# Patient Record
Sex: Female | Born: 1945 | Race: White | Hispanic: No | Marital: Married | Smoking: Former smoker
Health system: Southern US, Community
[De-identification: ages and names within clinical notes are randomized; demographics above are authoritative.]

## PROBLEM LIST (undated history)

## (undated) DIAGNOSIS — I639 Cerebral infarction, unspecified: Secondary | ICD-10-CM

## (undated) DIAGNOSIS — M810 Age-related osteoporosis without current pathological fracture: Secondary | ICD-10-CM

## (undated) DIAGNOSIS — I10 Essential (primary) hypertension: Secondary | ICD-10-CM

## (undated) DIAGNOSIS — F419 Anxiety disorder, unspecified: Secondary | ICD-10-CM

## (undated) DIAGNOSIS — R569 Unspecified convulsions: Secondary | ICD-10-CM

## (undated) DIAGNOSIS — F1010 Alcohol abuse, uncomplicated: Secondary | ICD-10-CM

## (undated) HISTORY — DX: Alcohol abuse, uncomplicated: F10.10

## (undated) HISTORY — PX: ABDOMINAL HYSTERECTOMY: SHX81

## (undated) HISTORY — DX: Anxiety disorder, unspecified: F41.9

## (undated) HISTORY — PX: EMPYEMA DRAINAGE: SHX5097

## (undated) HISTORY — DX: Unspecified convulsions: R56.9

## (undated) HISTORY — PX: BREAST LUMPECTOMY: SHX2

## (undated) HISTORY — DX: Age-related osteoporosis without current pathological fracture: M81.0

## (undated) HISTORY — PX: OOPHORECTOMY: SHX86

## (undated) HISTORY — DX: Cerebral infarction, unspecified: I63.9

---

## 1999-03-14 HISTORY — PX: FLEXIBLE SIGMOIDOSCOPY: SHX1649

## 2001-04-16 ENCOUNTER — Ambulatory Visit (HOSPITAL_COMMUNITY): Admission: RE | Admit: 2001-04-16 | Discharge: 2001-04-16 | Payer: Self-pay | Admitting: Internal Medicine

## 2001-04-16 ENCOUNTER — Encounter: Payer: Self-pay | Admitting: Internal Medicine

## 2001-10-16 ENCOUNTER — Encounter: Payer: Self-pay | Admitting: Internal Medicine

## 2001-10-16 ENCOUNTER — Emergency Department (HOSPITAL_COMMUNITY): Admission: EM | Admit: 2001-10-16 | Discharge: 2001-10-16 | Payer: Self-pay | Admitting: Internal Medicine

## 2002-04-29 ENCOUNTER — Encounter: Payer: Self-pay | Admitting: Internal Medicine

## 2002-04-29 ENCOUNTER — Ambulatory Visit (HOSPITAL_COMMUNITY): Admission: RE | Admit: 2002-04-29 | Discharge: 2002-04-29 | Payer: Self-pay | Admitting: Internal Medicine

## 2003-05-11 ENCOUNTER — Ambulatory Visit (HOSPITAL_COMMUNITY): Admission: RE | Admit: 2003-05-11 | Discharge: 2003-05-11 | Payer: Self-pay | Admitting: Internal Medicine

## 2004-03-21 ENCOUNTER — Ambulatory Visit (HOSPITAL_COMMUNITY): Admission: RE | Admit: 2004-03-21 | Discharge: 2004-03-21 | Payer: Self-pay | Admitting: Internal Medicine

## 2004-06-14 ENCOUNTER — Ambulatory Visit (HOSPITAL_COMMUNITY): Admission: RE | Admit: 2004-06-14 | Discharge: 2004-06-14 | Payer: Self-pay | Admitting: Internal Medicine

## 2004-12-26 ENCOUNTER — Ambulatory Visit (HOSPITAL_COMMUNITY): Admission: RE | Admit: 2004-12-26 | Discharge: 2004-12-26 | Payer: Self-pay | Admitting: Internal Medicine

## 2005-12-28 ENCOUNTER — Ambulatory Visit (HOSPITAL_COMMUNITY): Admission: RE | Admit: 2005-12-28 | Discharge: 2005-12-28 | Payer: Self-pay | Admitting: Internal Medicine

## 2009-08-03 DEATH — deceased

## 2010-06-14 ENCOUNTER — Inpatient Hospital Stay (HOSPITAL_COMMUNITY)
Admission: EM | Admit: 2010-06-14 | Discharge: 2010-06-16 | Payer: Self-pay | Source: Home / Self Care | Attending: Internal Medicine | Admitting: Internal Medicine

## 2010-06-16 ENCOUNTER — Inpatient Hospital Stay (HOSPITAL_COMMUNITY)
Admission: EM | Admit: 2010-06-16 | Discharge: 2010-06-22 | Payer: Self-pay | Source: Home / Self Care | Attending: Internal Medicine | Admitting: Internal Medicine

## 2010-06-20 DIAGNOSIS — F329 Major depressive disorder, single episode, unspecified: Secondary | ICD-10-CM

## 2010-07-23 ENCOUNTER — Encounter: Payer: Self-pay | Admitting: Internal Medicine

## 2010-07-24 ENCOUNTER — Encounter: Payer: Self-pay | Admitting: Internal Medicine

## 2010-09-12 LAB — COMPREHENSIVE METABOLIC PANEL
ALT: 41 U/L — ABNORMAL HIGH (ref 0–35)
Albumin: 3.6 g/dL (ref 3.5–5.2)
CO2: 26 mEq/L (ref 19–32)
Chloride: 103 mEq/L (ref 96–112)
Creatinine, Ser: 0.59 mg/dL (ref 0.4–1.2)
GFR calc Af Amer: >60 mL/min (ref >60)
Glucose, Bld: 101 mg/dL — ABNORMAL HIGH (ref 70–99)
Potassium: 3.6 mEq/L (ref 3.5–5.1)
Total Bilirubin: 0.7 mg/dL (ref 0.3–1.2)
Total Protein: 6.1 g/dL (ref 6.0–8.3)

## 2010-09-12 LAB — DIFFERENTIAL
Basophils Absolute: 0 10*3/uL (ref 0.0–0.1)
Eosinophils Absolute: 0.1 10*3/uL (ref 0.0–0.7)
Eosinophils Relative: 2 % (ref 0–5)
Lymphocytes Relative: 34 % (ref 12–46)
Lymphs Abs: 2.1 10*3/uL (ref 0.7–4.0)
Monocytes Absolute: 0.6 10*3/uL (ref 0.1–1.0)
Monocytes Relative: 10 % (ref 3–12)
Neutro Abs: 10.6 10*3/uL — ABNORMAL HIGH (ref 1.7–7.7)
Neutro Abs: 3.3 10*3/uL (ref 1.7–7.7)
Neutrophils Relative %: 85 % — ABNORMAL HIGH (ref 43–77)

## 2010-09-12 LAB — CBC
HCT: 35 % — ABNORMAL LOW (ref 36.0–46.0)
MCH: 32.6 pg (ref 26.0–34.0)
MCV: 93.1 fL (ref 78.0–100.0)
Platelets: 184 10*3/uL (ref 150–400)
WBC: 12.5 10*3/uL — ABNORMAL HIGH (ref 4.0–10.5)

## 2010-09-12 LAB — HEPATIC FUNCTION PANEL
Albumin: 3 g/dL — ABNORMAL LOW (ref 3.5–5.2)
Bilirubin, Direct: <0.1 mg/dL (ref 0.0–0.3)
Total Bilirubin: 0.3 mg/dL (ref 0.3–1.2)
Total Protein: 5.5 g/dL — ABNORMAL LOW (ref 6.0–8.3)

## 2010-09-13 LAB — COMPREHENSIVE METABOLIC PANEL
CO2: 21 mEq/L (ref 19–32)
Chloride: 102 mEq/L (ref 96–112)
GFR calc Af Amer: >60 mL/min (ref >60)
Potassium: 3.6 mEq/L (ref 3.5–5.1)
Sodium: 136 mEq/L (ref 135–145)

## 2010-09-13 LAB — POCT CARDIAC MARKERS
Myoglobin, poc: >500 ng/mL (ref 12–200)
Troponin i, poc: <0.05 ng/mL (ref 0.00–0.09)

## 2010-09-13 LAB — DIFFERENTIAL
Basophils Absolute: 0 10*3/uL (ref 0.0–0.1)
Basophils Relative: 0 % (ref 0–1)
Eosinophils Absolute: 0 10*3/uL (ref 0.0–0.7)
Lymphocytes Relative: 5 % — ABNORMAL LOW (ref 12–46)
Monocytes Absolute: 0.4 10*3/uL (ref 0.1–1.0)
Neutro Abs: 9.1 10*3/uL — ABNORMAL HIGH (ref 1.7–7.7)

## 2010-09-13 LAB — CBC
HCT: 43 % (ref 36.0–46.0)
Hemoglobin: 15.2 g/dL — ABNORMAL HIGH (ref 12.0–15.0)
MCH: 33 pg (ref 26.0–34.0)
MCHC: 35.3 g/dL (ref 30.0–36.0)
MCV: 93.5 fL (ref 78.0–100.0)
RBC: 4.6 MIL/uL (ref 3.87–5.11)

## 2010-09-13 LAB — RAPID URINE DRUG SCREEN, HOSP PERFORMED
Amphetamines: NOT DETECTED
Benzodiazepines: POSITIVE — AB
Tetrahydrocannabinol: NOT DETECTED

## 2010-09-13 LAB — URINE CULTURE
Colony Count: 15000
Culture  Setup Time: 201112132132

## 2010-09-13 LAB — URINALYSIS, ROUTINE W REFLEX MICROSCOPIC: Specific Gravity, Urine: 1.025 (ref 1.005–1.030)

## 2010-09-13 LAB — ETHANOL: Alcohol, Ethyl (B): <5 mg/dL (ref 0–10)

## 2010-11-10 ENCOUNTER — Other Ambulatory Visit (HOSPITAL_COMMUNITY): Payer: Self-pay | Admitting: Internal Medicine

## 2010-11-14 ENCOUNTER — Ambulatory Visit (HOSPITAL_COMMUNITY)
Admission: RE | Admit: 2010-11-14 | Discharge: 2010-11-14 | Disposition: A | Discharge status: Home / Self Care | Payer: Medicare Other | Source: Ambulatory Visit | Attending: Internal Medicine | Admitting: Internal Medicine

## 2010-11-14 ENCOUNTER — Encounter (HOSPITAL_COMMUNITY): Payer: Self-pay

## 2010-11-14 DIAGNOSIS — M818 Other osteoporosis without current pathological fracture: Secondary | ICD-10-CM | POA: Insufficient documentation

## 2010-11-14 HISTORY — DX: Essential (primary) hypertension: I10

## 2012-03-28 ENCOUNTER — Other Ambulatory Visit (HOSPITAL_COMMUNITY): Payer: Self-pay | Admitting: Internal Medicine

## 2012-03-28 DIAGNOSIS — Z139 Encounter for screening, unspecified: Secondary | ICD-10-CM

## 2012-04-08 ENCOUNTER — Telehealth: Payer: Self-pay | Admitting: *Deleted

## 2012-04-08 ENCOUNTER — Ambulatory Visit (HOSPITAL_COMMUNITY)
Admission: RE | Admit: 2012-04-08 | Discharge: 2012-04-08 | Disposition: A | Discharge status: Home / Self Care | Payer: Medicare Other | Source: Ambulatory Visit | Attending: Internal Medicine | Admitting: Internal Medicine

## 2012-04-08 DIAGNOSIS — Z1231 Encounter for screening mammogram for malignant neoplasm of breast: Secondary | ICD-10-CM | POA: Insufficient documentation

## 2012-04-08 DIAGNOSIS — Z139 Encounter for screening, unspecified: Secondary | ICD-10-CM

## 2012-04-08 NOTE — Telephone Encounter (Signed)
[name] called to be triaged for a colonoscopy. She will be unavailable the rest of the day, however you can call her tomorrow and she prefers to be called Shelley [name]. Thanks.

## 2012-04-09 ENCOUNTER — Telehealth: Payer: Self-pay

## 2012-04-09 NOTE — Telephone Encounter (Signed)
See separate triage.  

## 2012-04-09 NOTE — Telephone Encounter (Signed)
Started triage. Pt to call back with med list and insurance info.

## 2012-04-10 ENCOUNTER — Telehealth: Payer: Self-pay | Admitting: *Deleted

## 2012-04-10 NOTE — Telephone Encounter (Signed)
Shelley [name] called today to be triaged for a colonoscopy. Please call her back. Thanks.

## 2012-04-10 NOTE — Telephone Encounter (Signed)
See triage phone call. Pt will bring list of meds by to complete her triage.

## 2012-04-10 NOTE — Telephone Encounter (Signed)
Pt will try to get med list by the office today. Her husband keeps them locked up and she will get him to make a copy and bring it by.

## 2012-05-01 ENCOUNTER — Telehealth: Payer: Self-pay

## 2012-05-02 NOTE — Telephone Encounter (Signed)
Pt had sent a list of her meds back. I called back yesterday to get her scheduled. Each time I call and tell who I am and what I am calling for, she still asks me are we the [facility]. She doesn't seem to understand the conversation. Per Lorenza Burton, NP, schedule pt an OV prior to colonoscopy. Ask if she has a POA or please bring a family member with her when she comes and bring all meds to appt. I called, line was busy and could not leave a message.

## 2012-05-08 NOTE — Telephone Encounter (Signed)
Called and scheduled OV with KJ on 05/13/2012 at 2:00 PM. Pt said her husband would come with her to the appt.

## 2012-05-13 ENCOUNTER — Ambulatory Visit: Payer: Medicare Other | Admitting: Urgent Care

## 2012-05-14 ENCOUNTER — Ambulatory Visit (INDEPENDENT_AMBULATORY_CARE_PROVIDER_SITE_OTHER): Payer: Medicare Other | Admitting: Gastroenterology

## 2012-05-14 ENCOUNTER — Encounter: Payer: Self-pay | Admitting: Gastroenterology

## 2012-05-14 VITALS — HR 80 | Temp 98.1°F | Ht 64.0 in | Wt 152.2 lb

## 2012-05-14 DIAGNOSIS — Z1211 Encounter for screening for malignant neoplasm of colon: Secondary | ICD-10-CM | POA: Insufficient documentation

## 2012-05-14 MED ORDER — PEG 3350-KCL-NA BICARB-NACL 420 G PO SOLR: 4000.0000 mL | ORAL | Status: DC

## 2012-05-14 NOTE — Patient Instructions (Addendum)
We have set you up for a colonoscopy with Dr. Fields in the near future.  Further recommendations to follow once completed.    

## 2012-05-14 NOTE — Progress Notes (Signed)
Primary Care Physician:  Carylon Perches, MD Primary Gastroenterologist:  Dr. Darrick Penna   Chief Complaint  Patient presents with  . Colonoscopy    HPI:   Ms. Nobis presents today as a new patient for screening colonoscopy. She has not had a complete evaluation of her lower GI tract; however, she did have a flex sig by Dr. Ouida Sills in September 2000 with findings of internal hemorrhoids.  Sometimes if moves a certain way will have left-sided or right-sided abdominal discomfort. Denies any rectal bleeding. No constipation or diarrhea. No GERD. No dysphagia. No lack of appetite or wt loss.  States her son and his two children live with her in their 2 bedroom house. Notes stress r/t this.  Past Medical History  Diagnosis Date  . Hypertension   . CVA (cerebral infarction)     pt denies  . DM (diabetes mellitus)   . Seizures   . Osteoporosis   . Anxiety   . Alcohol abuse     drinks awhile     Past Surgical History  Procedure Date  . Abdominal hysterectomy   . Breast lumpectomy   . Oophorectomy   . Empyema drainage   . Flexible sigmoidoscopy 03/14/99    internal hemorrhoids    Current Outpatient Prescriptions  Medication Sig Dispense Refill  . ALPRAZolam (XANAX) 1 MG tablet Take 1 mg by mouth 2 (two) times daily as needed.      Marland Kitchen aspirin 81 MG tablet Take 81 mg by mouth daily.      . citalopram (CELEXA) 20 MG tablet Take 20 mg by mouth daily.      Marland Kitchen levETIRAcetam (KEPPRA) 500 MG tablet Take 500 mg by mouth 2 (two) times daily.      Marland Kitchen lisinopril (PRINIVIL,ZESTRIL) 10 MG tablet Take 10 mg by mouth daily.      . Multiple Vitamins-Minerals (CENTRUM SILVER PO) Take by mouth 1 day or 1 dose.      . polyethylene glycol-electrolytes (TRILYTE) 420 G solution Take 4,000 mLs by mouth as directed.  4000 mL  0    Allergies as of 05/14/2012  . (No Known Allergies)    Family History  Problem Relation Age of Onset  . Colon cancer Neg Hx     History   Social History  . Marital Status:  Married    Spouse Name: N/A    Number of Children: N/A  . Years of Education: N/A   Occupational History  . Not on file.   Social History Main Topics  . Smoking status: Current Some Day Smoker -- 0.5 packs/day    Types: Cigarettes  . Smokeless tobacco: Not on file  . Alcohol Use: No     Comment: hx of ETOH abuse  . Drug Use: No  . Sexually Active: Not on file   Other Topics Concern  . Not on file   Social History Narrative  . No narrative on file    Review of Systems: Gen: Denies any fever, chills, fatigue, weight loss, lack of appetite.  CV: Denies chest pain, heart palpitations, peripheral edema, syncope.  Resp: Denies shortness of breath at rest or with exertion. Denies wheezing or cough.  GI: Denies dysphagia or odynophagia. Denies jaundice, hematemesis, fecal incontinence. GU : Denies urinary burning, urinary frequency, urinary hesitancy MS: +joint pain Derm: Denies rash, itching, dry skin Psych: +depression, no memory loss, and confusion Heme: Denies bruising, bleeding, and enlarged lymph nodes.  Physical Exam: Pulse 80  Temp 98.1 F (36.7 C) (Temporal)  Ht 5\' 4"  (1.626 m)  Wt 152 lb 3.2 oz (69.037 kg)  BMI 26.12 kg/m2 General:   Alert and oriented. Pleasant and cooperative. Well-nourished and well-developed.  Head:  Normocephalic and atraumatic. Eyes:  Without icterus, sclera clear and conjunctiva pink.  Ears:  Normal auditory acuity. Nose:  No deformity, discharge,  or lesions. Mouth:  No deformity or lesions, oral mucosa pink.  Neck:  Supple, without mass or thyromegaly. Lungs:  Clear to auscultation bilaterally. No wheezes, rales, or rhonchi. No distress.  Heart:  S1, S2 present without murmurs appreciated.  Abdomen:  +BS, soft, non-tender and non-distended. No HSM noted. No guarding or rebound. No masses appreciated.  Rectal:  Deferred  Msk:  Symmetrical without gross deformities. Normal posture. Extremities:  Without clubbing or edema. Neurologic:   Alert and  oriented x4;  grossly normal neurologically. Skin:  Intact without significant lesions or rashes. Cervical Nodes:  No significant cervical adenopathy. Psych:  Alert and cooperative. Normal mood and affect.

## 2012-05-14 NOTE — Progress Notes (Signed)
Faxed to PCP

## 2012-05-14 NOTE — Assessment & Plan Note (Signed)
66 year old female with need for initial screening colonoscopy. Flex sig performed by Dr. Ouida Sills in Sept 2000 with internal hemorrhoids. Shelley [name] denies any lower or upper GI symptoms. However, she does lose her train of thought quite easily and seems somewhat distracted. Poor historian, and it is unclear if this is her baseline. It appears she may be dealing with stress secondary to her son and 2 grandchildren living with her. However, she is in good spirits overall and states understanding regarding the procedure. Due to her current medications, she will have procedure with Propofol.  Proceed with colonoscopy with Dr. Darrick Penna in the near future. Propofol secondary to polypharmacy. The risks, benefits, and alternatives have been discussed in detail with the patient. They state understanding and desire to proceed.

## 2012-05-21 ENCOUNTER — Encounter (HOSPITAL_COMMUNITY): Payer: Self-pay | Admitting: Pharmacy Technician

## 2012-05-27 NOTE — Patient Instructions (Addendum)
20 Shelley [name]  05/27/2012   Your procedure is scheduled on:   06/04/2012   Report to [facility] at  615  AM.  Call this number if you have problems the morning of surgery: [phone]   Remember:   Do not eat food:After Midnight.  May have clear liquids:until Midnight .    Take these medicines the morning of surgery with A SIP OF WATER: xanax,celexa,keppra,lisinoril   Do not wear jewelry, make-up or nail polish.  Do not wear lotions, powders, or perfumes.   Do not shave 48 hours prior to surgery. Men may shave face and neck.  Do not bring valuables to the hospital.  Contacts, dentures or bridgework may not be worn into surgery.  Leave suitcase in the car. After surgery it may be brought to your room.  For patients admitted to the hospital, checkout time is 11:00 AM the day of discharge.   Patients discharged the day of surgery will not be allowed to drive home.  Name and phone number of your driver: family  Special Instructions: N/A   Please read over the following fact sheets that you were given: Pain Booklet, Coughing and Deep Breathing, Surgical Site Infection Prevention, Anesthesia Post-op Instructions and Care and Recovery After Surgery Colonoscopy A colonoscopy is an exam to evaluate your entire colon. In this exam, your colon is cleansed. A long fiberoptic tube is inserted through your rectum and into your colon. The fiberoptic scope (endoscope) is a long bundle of enclosed and very flexible fibers. These fibers transmit light to the area examined and send images from that area to your caregiver. Discomfort is usually minimal. You may be given a drug to help you sleep (sedative) during or prior to the procedure. This exam helps to detect lumps (tumors), polyps, inflammation, and areas of bleeding. Your caregiver may also take a small piece of tissue (biopsy) that will be examined under a microscope. LET YOUR CAREGIVER KNOW ABOUT:   Allergies to food or  medicine.  Medicines taken, including vitamins, herbs, eyedrops, over-the-counter medicines, and creams.  Use of steroids (by mouth or creams).  Previous problems with anesthetics or numbing medicines.  History of bleeding problems or blood clots.  Previous surgery.  Other health problems, including diabetes and kidney problems.  Possibility of pregnancy, if this applies. BEFORE THE PROCEDURE   A clear liquid diet may be required for 2 days before the exam.  Ask your caregiver about changing or stopping your regular medications.  Liquid injections (enemas) or laxatives may be required.  A large amount of electrolyte solution may be given to you to drink over a short period of time. This solution is used to clean out your colon.  You should be present 60 minutes prior to your procedure or as directed by your caregiver. AFTER THE PROCEDURE   If you received a sedative or pain relieving medication, you will need to arrange for someone to drive you home.  Occasionally, there is a little blood passed with the first bowel movement. Do not be concerned. FINDING OUT THE RESULTS OF YOUR TEST Not all test results are available during your visit. If your test results are not back during the visit, make an appointment with your caregiver to find out the results. Do not assume everything is normal if you have not heard from your caregiver or the medical facility. It is important for you to follow up on all of your test results. HOME CARE INSTRUCTIONS  It is not unusual to pass moderate amounts of gas and experience mild abdominal cramping following the procedure. This is due to air being used to inflate your colon during the exam. Walking or a warm pack on your belly (abdomen) may help.  You may resume all normal meals and activities after sedatives and medicines have worn off.  Only take over-the-counter or prescription medicines for pain, discomfort, or fever as directed by your  caregiver. Do not use aspirin or blood thinners if a biopsy was taken. Consult your caregiver for medicine usage if biopsies were taken. SEEK IMMEDIATE MEDICAL CARE IF:   You have a fever.  You pass large blood clots or fill a toilet with blood following the procedure. This may also occur 10 to 14 days following the procedure. This is more likely if a biopsy was taken.  You develop abdominal pain that keeps getting worse and cannot be relieved with medicine. Document Released: 06/16/2000 Document Revised: 09/11/2011 Document Reviewed: 01/30/2008 [facility] Patient Information 2013 [place], [place]. PATIENT INSTRUCTIONS POST-ANESTHESIA  IMMEDIATELY FOLLOWING SURGERY:  Do not drive or operate machinery for the first twenty four hours after surgery.  Do not make any important decisions for twenty four hours after surgery or while taking narcotic pain medications or sedatives.  If you develop intractable nausea and vomiting or a severe headache please notify your doctor immediately.  FOLLOW-UP:  Please make an appointment with your surgeon as instructed. You do not need to follow up with anesthesia unless specifically instructed to do so.  WOUND CARE INSTRUCTIONS (if applicable):  Keep a dry clean dressing on the anesthesia/puncture wound site if there is drainage.  Once the wound has quit draining you may leave it open to air.  Generally you should leave the bandage intact for twenty four hours unless there is drainage.  If the epidural site drains for more than 36-48 hours please call the anesthesia department.  QUESTIONS?:  Please feel free to call your physician or the hospital operator if you have any questions, and they will be happy to assist you.

## 2012-05-28 ENCOUNTER — Encounter (HOSPITAL_COMMUNITY): Payer: Self-pay

## 2012-05-28 ENCOUNTER — Encounter (HOSPITAL_COMMUNITY)
Admission: RE | Admit: 2012-05-28 | Discharge: 2012-05-28 | Disposition: A | Discharge status: Home / Self Care | Payer: Medicare Other | Source: Ambulatory Visit | Attending: Gastroenterology | Admitting: Gastroenterology

## 2012-05-28 LAB — BASIC METABOLIC PANEL
BUN: 18 mg/dL (ref 6–23)
Calcium: 10.4 mg/dL (ref 8.4–10.5)
GFR calc Af Amer: >90 mL/min (ref >90)
GFR calc non Af Amer: 88 mL/min — ABNORMAL LOW (ref >90)
Potassium: 3.8 mEq/L (ref 3.5–5.1)
Sodium: 141 mEq/L (ref 135–145)

## 2012-05-28 LAB — HEMOGLOBIN AND HEMATOCRIT, BLOOD: HCT: 43.2 % (ref 36.0–46.0)

## 2012-06-04 ENCOUNTER — Ambulatory Visit (HOSPITAL_COMMUNITY): Payer: Medicare Other | Admitting: Anesthesiology

## 2012-06-04 ENCOUNTER — Encounter (HOSPITAL_COMMUNITY)
Admission: RE | Disposition: A | Discharge status: Home / Self Care | Payer: Self-pay | Source: Ambulatory Visit | Attending: Gastroenterology

## 2012-06-04 ENCOUNTER — Ambulatory Visit (HOSPITAL_COMMUNITY)
Admission: RE | Admit: 2012-06-04 | Discharge: 2012-06-04 | Disposition: A | Discharge status: Home / Self Care | Payer: Medicare Other | Source: Ambulatory Visit | Attending: Gastroenterology | Admitting: Gastroenterology

## 2012-06-04 ENCOUNTER — Encounter (HOSPITAL_COMMUNITY): Payer: Self-pay | Admitting: Anesthesiology

## 2012-06-04 ENCOUNTER — Encounter (HOSPITAL_COMMUNITY): Payer: Self-pay | Admitting: *Deleted

## 2012-06-04 DIAGNOSIS — E119 Type 2 diabetes mellitus without complications: Secondary | ICD-10-CM | POA: Insufficient documentation

## 2012-06-04 DIAGNOSIS — D126 Benign neoplasm of colon, unspecified: Secondary | ICD-10-CM | POA: Insufficient documentation

## 2012-06-04 DIAGNOSIS — Z1211 Encounter for screening for malignant neoplasm of colon: Secondary | ICD-10-CM | POA: Insufficient documentation

## 2012-06-04 DIAGNOSIS — Z01810 Encounter for preprocedural cardiovascular examination: Secondary | ICD-10-CM | POA: Insufficient documentation

## 2012-06-04 DIAGNOSIS — K648 Other hemorrhoids: Secondary | ICD-10-CM | POA: Insufficient documentation

## 2012-06-04 DIAGNOSIS — I10 Essential (primary) hypertension: Secondary | ICD-10-CM | POA: Insufficient documentation

## 2012-06-04 DIAGNOSIS — K5730 Diverticulosis of large intestine without perforation or abscess without bleeding: Secondary | ICD-10-CM | POA: Insufficient documentation

## 2012-06-04 DIAGNOSIS — Z01812 Encounter for preprocedural laboratory examination: Secondary | ICD-10-CM | POA: Insufficient documentation

## 2012-06-04 HISTORY — PX: POLYPECTOMY: SHX5525

## 2012-06-04 HISTORY — PX: COLONOSCOPY WITH PROPOFOL: SHX5780

## 2012-06-04 SURGERY — COLONOSCOPY WITH PROPOFOL
Anesthesia: Monitor Anesthesia Care | Site: Rectum

## 2012-06-04 MED ORDER — ONDANSETRON HCL 4 MG/2ML IJ SOLN
INTRAMUSCULAR | Status: AC
  Filled 2012-06-04: qty 2

## 2012-06-04 MED ORDER — MIDAZOLAM HCL 2 MG/2ML IJ SOLN
INTRAMUSCULAR | Status: AC
  Filled 2012-06-04: qty 2

## 2012-06-04 MED ORDER — PROPOFOL 10 MG/ML IV BOLUS
INTRAVENOUS | Status: DC | PRN
  Administered 2012-06-04: 30 mg via INTRAVENOUS
  Administered 2012-06-04: 20 mg via INTRAVENOUS

## 2012-06-04 MED ORDER — STERILE WATER FOR IRRIGATION IR SOLN
Status: DC | PRN
  Administered 2012-06-04: 08:00:00

## 2012-06-04 MED ORDER — GLYCOPYRROLATE 0.2 MG/ML IJ SOLN
0.2000 mg | Freq: Once | INTRAMUSCULAR | Status: AC
  Administered 2012-06-04: 0.2 mg via INTRAVENOUS

## 2012-06-04 MED ORDER — FENTANYL CITRATE 0.05 MG/ML IJ SOLN: 25.0000 ug | INTRAMUSCULAR | Status: DC | PRN

## 2012-06-04 MED ORDER — FENTANYL CITRATE 0.05 MG/ML IJ SOLN
INTRAMUSCULAR | Status: AC
  Filled 2012-06-04: qty 2

## 2012-06-04 MED ORDER — MIDAZOLAM HCL 5 MG/5ML IJ SOLN
INTRAMUSCULAR | Status: DC | PRN
  Administered 2012-06-04: 2 mg via INTRAVENOUS

## 2012-06-04 MED ORDER — PROPOFOL INFUSION 10 MG/ML OPTIME
INTRAVENOUS | Status: DC | PRN
  Administered 2012-06-04: 75 ug/kg/min via INTRAVENOUS

## 2012-06-04 MED ORDER — MIDAZOLAM HCL 2 MG/2ML IJ SOLN
1.0000 mg | INTRAMUSCULAR | Status: DC | PRN
  Administered 2012-06-04: 2 mg via INTRAVENOUS

## 2012-06-04 MED ORDER — PROPOFOL 10 MG/ML IV EMUL
INTRAVENOUS | Status: AC
  Filled 2012-06-04: qty 20

## 2012-06-04 MED ORDER — ONDANSETRON HCL 4 MG/2ML IJ SOLN
4.0000 mg | Freq: Once | INTRAMUSCULAR | Status: AC
  Administered 2012-06-04: 4 mg via INTRAVENOUS

## 2012-06-04 MED ORDER — LIDOCAINE HCL (CARDIAC) 20 MG/ML IV SOLN
INTRAVENOUS | Status: DC | PRN
  Administered 2012-06-04: 30 mg via INTRAVENOUS

## 2012-06-04 MED ORDER — LIDOCAINE HCL (PF) 1 % IJ SOLN
INTRAMUSCULAR | Status: AC
  Filled 2012-06-04: qty 5

## 2012-06-04 MED ORDER — GLYCOPYRROLATE 0.2 MG/ML IJ SOLN
INTRAMUSCULAR | Status: AC
  Filled 2012-06-04: qty 1

## 2012-06-04 MED ORDER — ONDANSETRON HCL 4 MG/2ML IJ SOLN: 4.0000 mg | Freq: Once | INTRAMUSCULAR | Status: DC | PRN

## 2012-06-04 MED ORDER — LACTATED RINGERS IV SOLN
INTRAVENOUS | Status: DC | PRN
  Administered 2012-06-04: 08:00:00 via INTRAVENOUS

## 2012-06-04 MED ORDER — LACTATED RINGERS IV SOLN
INTRAVENOUS | Status: DC
  Administered 2012-06-04: 1000 mL via INTRAVENOUS

## 2012-06-04 SURGICAL SUPPLY — 22 items
ELECT REM PT RETURN 9FT ADLT (ELECTROSURGICAL)
ELECTRODE REM PT RTRN 9FT ADLT (ELECTROSURGICAL) IMPLANT
FCP BXJMBJMB 240X2.8X (CUTTING FORCEPS)
FLOOR PAD 36X40 (MISCELLANEOUS) ×2
FORCEPS BIOP RAD 4 LRG CAP 4 (CUTTING FORCEPS) ×1 IMPLANT
FORCEPS BIOP RJ4 240 W/NDL (CUTTING FORCEPS)
FORCEPS BXJMBJMB 240X2.8X (CUTTING FORCEPS) IMPLANT
INJECTOR/SNARE I SNARE (MISCELLANEOUS) IMPLANT
LUBRICANT JELLY 4.5OZ STERILE (MISCELLANEOUS) ×1 IMPLANT
MANIFOLD NEPTUNE II (INSTRUMENTS) ×1 IMPLANT
NDL SCLEROTHERAPY 25GX240 (NEEDLE) IMPLANT
NEEDLE SCLEROTHERAPY 25GX240 (NEEDLE) IMPLANT
PAD FLOOR 36X40 (MISCELLANEOUS) ×1 IMPLANT
PROBE APC STR FIRE (PROBE) IMPLANT
PROBE INJECTION GOLD (MISCELLANEOUS)
PROBE INJECTION GOLD 7FR (MISCELLANEOUS) IMPLANT
SNARE SHORT THROW 13M SML OVAL (MISCELLANEOUS) ×1 IMPLANT
SYR 50ML LL SCALE MARK (SYRINGE) ×1 IMPLANT
TRAP SPECIMEN MUCOUS 40CC (MISCELLANEOUS) IMPLANT
TUBING ENDO SMARTCAP PENTAX (MISCELLANEOUS) ×1 IMPLANT
TUBING IRRIGATION ENDOGATOR (MISCELLANEOUS) ×1 IMPLANT
WATER STERILE IRR 1000ML POUR (IV SOLUTION) ×1 IMPLANT

## 2012-06-04 NOTE — Op Note (Signed)
[facility] [address] [place], [address]   COLONOSCOPY PROCEDURE REPORT  PATIENT: Shelley, [name].  MR#: [id number] BIRTHDATE: 05-05-1946 , 66  yrs. old GENDER: Female ENDOSCOPIST: Jonette Eva, MD REFERRED Raphael Gibney, M.D. PROCEDURE DATE:  06/04/2012 PROCEDURE:   Colonoscopy with cold biopsy polypectomy INDICATIONS:Average risk patient for colon cancer. MEDICATIONS: MAC sedation, administered by CRNA  DESCRIPTION OF PROCEDURE:    Physical exam was performed.  Informed consent was obtained from the patient after explaining the benefits, risks, and alternatives to procedure.  The patient was connected to monitor and placed in left lateral position. Continuous oxygen was provided by nasal cannula and IV medicine administered through an indwelling cannula.  After administration of sedation and rectal exam, the patients rectum was intubated and the     colonoscope was advanced under direct visualization to the ileum.  The scope was removed slowly by carefully examining the color, texture, anatomy, and integrity mucosa on the way out.  The patient was recovered in endoscopy and discharged home in satisfactory condition.       COLON FINDINGS: The mucosa appeared normal in the terminal ileum.  , A sessile polyp measuring 4 mm in size was found in the descending colon.  A polypectomy was performed with cold forceps.  , Mild diverticulosis was noted in the descending colon and sigmoid colon. , and Small internal hemorrhoids were found.  PREP QUALITY: good. CECAL W/D TIME: 16 minutes  COMPLICATIONS: None  ENDOSCOPIC IMPRESSION: 1.   Normal mucosa in the terminal ileum 2.   Sessile polyp measuring 4 mm in size was found in the descending colon; polypectomy was performed with cold forceps 3.   Mild diverticulosis was noted in the descending colon and sigmoid colon 4.   Small internal hemorrhoids  RECOMMENDATIONS: HOLD ASA.  RE-START DEC 10. HIGH FIBER  DIET TCS IN 10 YEARS       _______________________________ eSignedJonette Eva, MD 06/04/2012 9:18 AM     PATIENT NAME:  Shelley [name]. MR#: [id number]

## 2012-06-04 NOTE — H&P (Signed)
  Primary Care Physician:  Carylon Perches, MD Primary Gastroenterologist:  Dr. Darrick Penna  Pre-Procedure History & Physical: HPI:  Shelley [name] is a 66 y.o. female here for COLON CANCER SCREENING.  Past Medical History  Diagnosis Date  . Hypertension   . CVA (cerebral infarction)     pt denies  . DM (diabetes mellitus)   . Seizures   . Osteoporosis   . Anxiety   . Alcohol abuse     drinks awhile     Past Surgical History  Procedure Date  . Abdominal hysterectomy   . Breast lumpectomy   . Oophorectomy   . Empyema drainage   . Flexible sigmoidoscopy 03/14/99    internal hemorrhoids    Prior to Admission medications   Medication Sig Start Date End Date Taking? Authorizing Provider  ALPRAZolam Prudy Feeler) 1 MG tablet Take 1 mg by mouth 2 (two) times daily as needed. Anxiety   Yes Historical Provider, MD  aspirin 81 MG tablet Take 81 mg by mouth daily.   Yes Historical Provider, MD  citalopram (CELEXA) 20 MG tablet Take 20 mg by mouth daily.   Yes Historical Provider, MD  levETIRAcetam (KEPPRA) 500 MG tablet Take 500 mg by mouth 2 (two) times daily.   Yes Historical Provider, MD  lisinopril (PRINIVIL,ZESTRIL) 10 MG tablet Take 10 mg by mouth daily.   Yes Historical Provider, MD  Multiple Vitamins-Minerals (CENTRUM SILVER PO) Take 1 tablet by mouth daily.    Yes Historical Provider, MD    Allergies as of 05/14/2012  . (No Known Allergies)    Family History  Problem Relation Age of Onset  . Colon cancer Neg Hx     History   Social History  . Marital Status: Married    Spouse Name: N/A    Number of Children: N/A  . Years of Education: N/A   Occupational History  . Not on file.   Social History Main Topics  . Smoking status: Current Some Day Smoker -- 0.5 packs/day    Types: Cigarettes  . Smokeless tobacco: Not on file  . Alcohol Use: No     Comment: hx of ETOH abuse  . Drug Use: No  . Sexually Active: Not on file   Other Topics Concern  . Not on file   Social  History Narrative  . No narrative on file    Review of Systems: See HPI, otherwise negative ROS   Physical Exam: BP 125/89  Pulse 82  Temp 98.1 F (36.7 C) (Oral)  Resp 22  SpO2 95% General:   Alert,  pleasant and cooperative in NAD Head:  Normocephalic and atraumatic. Neck:  Supple; Lungs:  Clear throughout to auscultation.    Heart:  Regular rate and rhythm. Abdomen:  Soft, nontender and nondistended. Normal bowel sounds, without guarding, and without rebound.   Neurologic:  Alert and  oriented x4;  grossly normal neurologically.  Impression/Plan:     SCREENING  Plan:  1. TCS TODAY

## 2012-06-04 NOTE — Preoperative (Signed)
Beta Blockers   Reason not to administer Beta Blockers:Not Applicable 

## 2012-06-04 NOTE — Anesthesia Preprocedure Evaluation (Addendum)
Anesthesia Evaluation  Patient identified by MRN, date of birth, ID band Patient awake    Reviewed: Allergy & Precautions, H&P , NPO status , Patient's Chart, lab work & pertinent test results, reviewed documented beta blocker date and time   History of Anesthesia Complications Negative for: history of anesthetic complications  Airway Mallampati: II TM Distance: <3 FB Neck ROM: Full    Dental  (+) Partial Upper, Partial Lower, Poor Dentition and Dental Advisory Given   Pulmonary pneumonia -, resolved, Current Smoker,  breath sounds clear to auscultation        Cardiovascular hypertension, Pt. on medications Rhythm:Regular Rate:Normal     Neuro/Psych Seizures -, Well Controlled,  PSYCHIATRIC DISORDERS Anxiety CVA, No Residual Symptoms    GI/Hepatic   Endo/Other  diabetes  Renal/GU      Musculoskeletal   Abdominal   Peds  Hematology   Anesthesia Other Findings   Reproductive/Obstetrics                        Anesthesia Physical Anesthesia Plan  ASA: III  Anesthesia Plan: MAC   Post-op Pain Management:    Induction: Intravenous  Airway Management Planned: Simple Face Mask  Additional Equipment:   Intra-op Plan:   Post-operative Plan:   Informed Consent: I have reviewed the patients History and Physical, chart, labs and discussed the procedure including the risks, benefits and alternatives for the proposed anesthesia with the patient or authorized representative who has indicated his/her understanding and acceptance.     Plan Discussed with:   Anesthesia Plan Comments:         Anesthesia Quick Evaluation

## 2012-06-04 NOTE — Addendum Note (Signed)
Addendum  created 06/04/12 1000 by Franco Nones, CRNA   Modules edited:Charges VN

## 2012-06-04 NOTE — Anesthesia Postprocedure Evaluation (Signed)
  Anesthesia Post-op Note  Patient: Shelley [name]  Procedure(s) Performed: Procedure(s) (LRB) with comments: COLONOSCOPY WITH PROPOFOL (N/A) - entered cecum @ 0752; total cecal withdrawal time = 13 minutes POLYPECTOMY (N/A) - descending colon polyps x2; rectal polyp x1  Patient Location: PACU  Anesthesia Type:MAC  Level of Consciousness: awake, alert  and oriented  Airway and Oxygen Therapy: Patient Spontanous Breathing  Post-op Pain: none  Post-op Assessment: Post-op Vital signs reviewed, Patient's Cardiovascular Status Stable, Respiratory Function Stable, Patent Airway, No signs of Nausea or vomiting, Adequate PO intake, Pain level controlled, No headache, No backache, No residual numbness and No residual motor weakness  Post-op Vital Signs: Reviewed and stable  Complications: No apparent anesthesia complications

## 2012-06-04 NOTE — Transfer of Care (Signed)
Immediate Anesthesia Transfer of Care Note  Patient: Shelley [name]  Procedure(s) Performed: Procedure(s) (LRB) with comments: COLONOSCOPY WITH PROPOFOL (N/A) - entered cecum @ 0752; total cecal withdrawal time = 13 minutes POLYPECTOMY (N/A) - descending colon polyps x2; rectal polyp x1  Patient Location: PACU  Anesthesia Type:MAC  Level of Consciousness: awake, alert  and oriented  Airway & Oxygen Therapy: Patient Spontanous Breathing and Patient connected to face mask oxygen  Post-op Assessment: Report given to PACU RN and Post -op Vital signs reviewed and stable  Post vital signs: Reviewed and stable  Complications: No apparent anesthesia complications

## 2012-06-05 ENCOUNTER — Telehealth: Payer: Self-pay | Admitting: Gastroenterology

## 2012-06-05 NOTE — Telephone Encounter (Signed)
Called and informed pt.  

## 2012-06-05 NOTE — Telephone Encounter (Signed)
Please call pt. 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 TCS in 10 years. High fiber diet.  

## 2012-06-06 ENCOUNTER — Encounter (HOSPITAL_COMMUNITY): Payer: Self-pay | Admitting: Gastroenterology

## 2012-06-06 NOTE — Telephone Encounter (Signed)
Path faxed to PCP, recall made 

## 2012-07-11 NOTE — Progress Notes (Signed)
Dec 2013 SIMPLE ADENOMAS, TICS(LC), IH  REVIEWED.

## 2012-08-12 ENCOUNTER — Other Ambulatory Visit (HOSPITAL_COMMUNITY): Payer: Self-pay | Admitting: Internal Medicine

## 2012-08-12 ENCOUNTER — Ambulatory Visit (HOSPITAL_COMMUNITY)
Admission: RE | Admit: 2012-08-12 | Discharge: 2012-08-12 | Disposition: A | Discharge status: Home / Self Care | Payer: Medicare Other | Source: Ambulatory Visit | Attending: Internal Medicine | Admitting: Internal Medicine

## 2012-08-12 DIAGNOSIS — M5416 Radiculopathy, lumbar region: Secondary | ICD-10-CM

## 2012-08-12 DIAGNOSIS — M5137 Other intervertebral disc degeneration, lumbosacral region: Secondary | ICD-10-CM | POA: Insufficient documentation

## 2012-08-12 DIAGNOSIS — M545 Low back pain, unspecified: Secondary | ICD-10-CM | POA: Insufficient documentation

## 2012-08-12 DIAGNOSIS — M51379 Other intervertebral disc degeneration, lumbosacral region without mention of lumbar back pain or lower extremity pain: Secondary | ICD-10-CM | POA: Insufficient documentation

## 2012-08-22 ENCOUNTER — Encounter (HOSPITAL_COMMUNITY): Payer: Self-pay

## 2012-08-22 ENCOUNTER — Emergency Department (HOSPITAL_COMMUNITY)
Admission: EM | Admit: 2012-08-22 | Discharge: 2012-08-22 | Disposition: A | Discharge status: Home / Self Care | Payer: Medicare Other | Attending: Emergency Medicine | Admitting: Emergency Medicine

## 2012-08-22 DIAGNOSIS — G40909 Epilepsy, unspecified, not intractable, without status epilepticus: Secondary | ICD-10-CM | POA: Insufficient documentation

## 2012-08-22 DIAGNOSIS — M810 Age-related osteoporosis without current pathological fracture: Secondary | ICD-10-CM | POA: Insufficient documentation

## 2012-08-22 DIAGNOSIS — Z8673 Personal history of transient ischemic attack (TIA), and cerebral infarction without residual deficits: Secondary | ICD-10-CM | POA: Insufficient documentation

## 2012-08-22 DIAGNOSIS — F411 Generalized anxiety disorder: Secondary | ICD-10-CM | POA: Insufficient documentation

## 2012-08-22 DIAGNOSIS — Z87828 Personal history of other (healed) physical injury and trauma: Secondary | ICD-10-CM | POA: Insufficient documentation

## 2012-08-22 DIAGNOSIS — Z7982 Long term (current) use of aspirin: Secondary | ICD-10-CM | POA: Insufficient documentation

## 2012-08-22 DIAGNOSIS — I10 Essential (primary) hypertension: Secondary | ICD-10-CM | POA: Insufficient documentation

## 2012-08-22 DIAGNOSIS — M25569 Pain in unspecified knee: Secondary | ICD-10-CM | POA: Insufficient documentation

## 2012-08-22 DIAGNOSIS — Z79899 Other long term (current) drug therapy: Secondary | ICD-10-CM | POA: Insufficient documentation

## 2012-08-22 DIAGNOSIS — F17200 Nicotine dependence, unspecified, uncomplicated: Secondary | ICD-10-CM | POA: Insufficient documentation

## 2012-08-22 MED ORDER — HYDROCODONE-ACETAMINOPHEN 5-325 MG PO TABS: 1.0000 | ORAL_TABLET | ORAL | Status: DC | PRN

## 2012-08-22 MED ORDER — HYDROCODONE-ACETAMINOPHEN 5-325 MG PO TABS
1.0000 | ORAL_TABLET | Freq: Once | ORAL | Status: AC
  Administered 2012-08-22: 1 via ORAL
  Filled 2012-08-22: qty 1

## 2012-08-22 NOTE — ED Notes (Signed)
Pt fell a week ago, saw dr Ouida Sills for left leg pain, given prednisone and tramadol which has not helped, states pain is worse

## 2012-08-22 NOTE — ED Notes (Signed)
Discharge instructions given and reviewed with patient.  Prescription given for Vicodin; effects and use explained.  Patient verbalized understanding of sedating effects of pain medication and to follow up with Dr. Ouida Sills.

## 2012-08-22 NOTE — ED Provider Notes (Signed)
History     CSN: [id number]  Arrival date & time 08/22/12  0306   First MD Initiated Contact with Patient 08/22/12 [phone]      Chief Complaint  Patient presents with  . Leg Pain    (Consider location/radiation/quality/duration/timing/severity/associated sxs/prior treatment) HPI Shelley [name] is a 67 y.o. female who presents to the Emergency Department complaining of back, knee, pain after a fall. She fell several days ago and saw Dr. Ouida Sills who got films and put the patient on tramadol and prednisone. She states that she woke tonight with pain to the left knee and left side. She has one day to finish her prednisone.   Past Medical History  Diagnosis Date  . Hypertension   . CVA (cerebral infarction)     pt denies  . Seizures   . Osteoporosis   . Anxiety   . Alcohol abuse     drinks awhile     Past Surgical History  Procedure Laterality Date  . Abdominal hysterectomy    . Breast lumpectomy    . Oophorectomy    . Empyema drainage    . Flexible sigmoidoscopy  03/14/99    internal hemorrhoids  . Colonoscopy with propofol  06/04/2012    Procedure: COLONOSCOPY WITH PROPOFOL;  Surgeon: West Bali, MD;  Location: [facility];  Service: Endoscopy;  Laterality: N/A;  entered cecum @ 0752; total cecal withdrawal time = 13 minutes  . Polypectomy  06/04/2012    Procedure: POLYPECTOMY;  Surgeon: West Bali, MD;  Location: [facility];  Service: Endoscopy;  Laterality: N/A;  descending colon polyps x2; rectal polyp x1    Family History  Problem Relation Age of Onset  . Colon cancer Neg Hx     History  Substance Use Topics  . Smoking status: Current Some Day Smoker -- 0.50 packs/day    Types: Cigarettes  . Smokeless tobacco: Not on file  . Alcohol Use: No     Comment: hx of ETOH abuse    OB History   Grav Para Term Preterm Abortions TAB SAB Ect Mult Living                  Review of Systems  Constitutional: Negative for fever.       10 Systems reviewed and are negative for  acute change except as noted in the HPI.  HENT: Negative for congestion.   Eyes: Negative for discharge and redness.  Respiratory: Negative for cough and shortness of breath.   Cardiovascular: Negative for chest pain.  Gastrointestinal: Negative for vomiting and abdominal pain.  Musculoskeletal: Negative for back pain.       Left knee pain and left side pain  Skin: Negative for rash.  Neurological: Negative for syncope, numbness and headaches.  Psychiatric/Behavioral:       No behavior change.    Allergies  Review of patient's allergies indicates no known allergies.  Home Medications   Current Outpatient Rx  Name  Route  Sig  Dispense  Refill  . ALPRAZolam (XANAX) 1 MG tablet   Oral   Take 1 mg by mouth 2 (two) times daily as needed. Anxiety         . aspirin 81 MG tablet   Oral   Take 81 mg by mouth daily.         . citalopram (CELEXA) 20 MG tablet   Oral   Take 20 mg by mouth daily.         Marland Kitchen levETIRAcetam (  KEPPRA) 500 MG tablet   Oral   Take 500 mg by mouth 2 (two) times daily.         Marland Kitchen lisinopril (PRINIVIL,ZESTRIL) 10 MG tablet   Oral   Take 10 mg by mouth daily.         . Multiple Vitamins-Minerals (CENTRUM SILVER PO)   Oral   Take 1 tablet by mouth daily.            BP 138/84  Pulse 66  Temp(Src) 98.3 F (36.8 C) (Oral)  Resp 20  Ht 5\' 4"  (1.626 m)  Wt 142 lb (64.411 kg)  BMI 24.36 kg/m2  SpO2 96%  Physical Exam  Nursing note and vitals reviewed. Constitutional: She appears well-developed and well-nourished.  Awake, alert, nontoxic appearance.  HENT:  Head: Atraumatic.  Eyes: Right eye exhibits no discharge. Left eye exhibits no discharge.  Neck: Neck supple.  Cardiovascular: Normal rate and normal heart sounds.   Pulmonary/Chest: Effort normal and breath sounds normal. She exhibits no tenderness.  Abdominal: Soft. Bowel sounds are normal. There is no tenderness. There is no rebound.  Musculoskeletal: Normal range of motion. She  exhibits no tenderness.  Baseline ROM, no obvious new focal weakness.  Neurological:  Mental status and motor strength appears baseline for patient and situation.  Skin: No rash noted.  Psychiatric: She has a normal mood and affect.    ED Course  Procedures (including critical care time)  [phone] Patient states pain to knee is gone.   MDM  Patient with fall a week ago here with continued pain to knee and left side. Given vicodin with relief. Pt stable in ED with no significant deterioration in condition.The patient appears reasonably screened and/or stabilized for discharge and I doubt any other medical condition or other [facility] requiring further screening, evaluation, or treatment in the ED at this time prior to discharge.  MDM Reviewed: nursing note and vitals           Nicoletta Dress. Colon Branch, MD 08/22/12 [phone]

## 2012-08-27 ENCOUNTER — Other Ambulatory Visit (HOSPITAL_COMMUNITY): Payer: Self-pay | Admitting: Internal Medicine

## 2012-08-27 DIAGNOSIS — M5416 Radiculopathy, lumbar region: Secondary | ICD-10-CM

## 2012-09-03 ENCOUNTER — Ambulatory Visit (HOSPITAL_COMMUNITY)
Admission: RE | Admit: 2012-09-03 | Discharge: 2012-09-03 | Disposition: A | Discharge status: Home / Self Care | Payer: Medicare Other | Source: Ambulatory Visit | Attending: Internal Medicine | Admitting: Internal Medicine

## 2012-09-03 ENCOUNTER — Encounter (HOSPITAL_COMMUNITY): Payer: Self-pay

## 2012-09-03 DIAGNOSIS — M545 Low back pain, unspecified: Secondary | ICD-10-CM | POA: Insufficient documentation

## 2012-09-03 DIAGNOSIS — M5126 Other intervertebral disc displacement, lumbar region: Secondary | ICD-10-CM | POA: Insufficient documentation

## 2012-09-03 DIAGNOSIS — M79609 Pain in unspecified limb: Secondary | ICD-10-CM | POA: Insufficient documentation

## 2012-09-03 DIAGNOSIS — M5416 Radiculopathy, lumbar region: Secondary | ICD-10-CM

## 2012-09-03 DIAGNOSIS — R262 Difficulty in walking, not elsewhere classified: Secondary | ICD-10-CM | POA: Insufficient documentation

## 2017-06-08 ENCOUNTER — Emergency Department (HOSPITAL_COMMUNITY): Payer: Medicare Other

## 2017-06-08 ENCOUNTER — Encounter (HOSPITAL_COMMUNITY): Payer: Self-pay | Admitting: Emergency Medicine

## 2017-06-08 ENCOUNTER — Other Ambulatory Visit: Payer: Self-pay

## 2017-06-08 ENCOUNTER — Inpatient Hospital Stay (HOSPITAL_COMMUNITY)
Admission: EM | Admit: 2017-06-08 | Discharge: 2017-06-13 | DRG: 065 | Disposition: A | Discharge status: Rehabilitation Facility | Payer: Medicare Other | Attending: Family Medicine | Admitting: Family Medicine

## 2017-06-08 DIAGNOSIS — R29700 NIHSS score 0: Secondary | ICD-10-CM | POA: Diagnosis present

## 2017-06-08 DIAGNOSIS — G40909 Epilepsy, unspecified, not intractable, without status epilepticus: Secondary | ICD-10-CM | POA: Diagnosis present

## 2017-06-08 DIAGNOSIS — I639 Cerebral infarction, unspecified: Secondary | ICD-10-CM | POA: Diagnosis not present

## 2017-06-08 DIAGNOSIS — R569 Unspecified convulsions: Secondary | ICD-10-CM | POA: Diagnosis not present

## 2017-06-08 DIAGNOSIS — I63541 Cerebral infarction due to unspecified occlusion or stenosis of right cerebellar artery: Secondary | ICD-10-CM | POA: Diagnosis not present

## 2017-06-08 DIAGNOSIS — Z79899 Other long term (current) drug therapy: Secondary | ICD-10-CM | POA: Diagnosis not present

## 2017-06-08 DIAGNOSIS — I471 Supraventricular tachycardia: Secondary | ICD-10-CM | POA: Diagnosis not present

## 2017-06-08 DIAGNOSIS — I5030 Unspecified diastolic (congestive) heart failure: Secondary | ICD-10-CM | POA: Diagnosis not present

## 2017-06-08 DIAGNOSIS — R269 Unspecified abnormalities of gait and mobility: Secondary | ICD-10-CM | POA: Diagnosis not present

## 2017-06-08 DIAGNOSIS — R531 Weakness: Secondary | ICD-10-CM | POA: Diagnosis not present

## 2017-06-08 DIAGNOSIS — E785 Hyperlipidemia, unspecified: Secondary | ICD-10-CM | POA: Diagnosis not present

## 2017-06-08 DIAGNOSIS — F411 Generalized anxiety disorder: Secondary | ICD-10-CM | POA: Diagnosis not present

## 2017-06-08 DIAGNOSIS — Z888 Allergy status to other drugs, medicaments and biological substances status: Secondary | ICD-10-CM

## 2017-06-08 DIAGNOSIS — Z7982 Long term (current) use of aspirin: Secondary | ICD-10-CM | POA: Diagnosis not present

## 2017-06-08 DIAGNOSIS — I69398 Other sequelae of cerebral infarction: Secondary | ICD-10-CM | POA: Diagnosis not present

## 2017-06-08 DIAGNOSIS — I10 Essential (primary) hypertension: Secondary | ICD-10-CM | POA: Diagnosis present

## 2017-06-08 DIAGNOSIS — E876 Hypokalemia: Secondary | ICD-10-CM

## 2017-06-08 DIAGNOSIS — I6381 Other cerebral infarction due to occlusion or stenosis of small artery: Principal | ICD-10-CM | POA: Diagnosis present

## 2017-06-08 DIAGNOSIS — F17210 Nicotine dependence, cigarettes, uncomplicated: Secondary | ICD-10-CM | POA: Diagnosis present

## 2017-06-08 DIAGNOSIS — G319 Degenerative disease of nervous system, unspecified: Secondary | ICD-10-CM | POA: Diagnosis not present

## 2017-06-08 DIAGNOSIS — H5122 Internuclear ophthalmoplegia, left eye: Secondary | ICD-10-CM | POA: Diagnosis not present

## 2017-06-08 DIAGNOSIS — I739 Peripheral vascular disease, unspecified: Secondary | ICD-10-CM | POA: Diagnosis not present

## 2017-06-08 DIAGNOSIS — M810 Age-related osteoporosis without current pathological fracture: Secondary | ICD-10-CM | POA: Diagnosis present

## 2017-06-08 DIAGNOSIS — R29818 Other symptoms and signs involving the nervous system: Secondary | ICD-10-CM | POA: Diagnosis not present

## 2017-06-08 DIAGNOSIS — I6350 Cerebral infarction due to unspecified occlusion or stenosis of unspecified cerebral artery: Secondary | ICD-10-CM | POA: Diagnosis not present

## 2017-06-08 DIAGNOSIS — I110 Hypertensive heart disease with heart failure: Secondary | ICD-10-CM | POA: Diagnosis present

## 2017-06-08 DIAGNOSIS — F4323 Adjustment disorder with mixed anxiety and depressed mood: Secondary | ICD-10-CM | POA: Diagnosis not present

## 2017-06-08 DIAGNOSIS — I6789 Other cerebrovascular disease: Secondary | ICD-10-CM | POA: Diagnosis not present

## 2017-06-08 DIAGNOSIS — Z87898 Personal history of other specified conditions: Secondary | ICD-10-CM | POA: Diagnosis not present

## 2017-06-08 DIAGNOSIS — R9082 White matter disease, unspecified: Secondary | ICD-10-CM | POA: Diagnosis present

## 2017-06-08 DIAGNOSIS — I519 Heart disease, unspecified: Secondary | ICD-10-CM | POA: Diagnosis not present

## 2017-06-08 DIAGNOSIS — F419 Anxiety disorder, unspecified: Secondary | ICD-10-CM | POA: Diagnosis present

## 2017-06-08 DIAGNOSIS — J90 Pleural effusion, not elsewhere classified: Secondary | ICD-10-CM | POA: Diagnosis not present

## 2017-06-08 DIAGNOSIS — Z6827 Body mass index (BMI) 27.0-27.9, adult: Secondary | ICD-10-CM | POA: Diagnosis not present

## 2017-06-08 DIAGNOSIS — H532 Diplopia: Secondary | ICD-10-CM | POA: Diagnosis not present

## 2017-06-08 DIAGNOSIS — E663 Overweight: Secondary | ICD-10-CM | POA: Diagnosis present

## 2017-06-08 DIAGNOSIS — Z90710 Acquired absence of both cervix and uterus: Secondary | ICD-10-CM

## 2017-06-08 DIAGNOSIS — I5189 Other ill-defined heart diseases: Secondary | ICD-10-CM

## 2017-06-08 LAB — DIFFERENTIAL
BASOS PCT: 0 %
Basophils Absolute: 0 10*3/uL (ref 0.0–0.1)
Eosinophils Absolute: 0 10*3/uL (ref 0.0–0.7)
Eosinophils Relative: 0 %
Lymphocytes Relative: 21 %
Lymphs Abs: 1.6 10*3/uL (ref 0.7–4.0)
Monocytes Absolute: 0.4 10*3/uL (ref 0.1–1.0)
Monocytes Relative: 6 %
NEUTROS ABS: 5.6 10*3/uL (ref 1.7–7.7)
Neutrophils Relative %: 73 %

## 2017-06-08 LAB — COMPREHENSIVE METABOLIC PANEL
ALT: 11 U/L — AB (ref 14–54)
ANION GAP: 10 (ref 5–15)
AST: 20 U/L (ref 15–41)
Albumin: 3.8 g/dL (ref 3.5–5.0)
Alkaline Phosphatase: 109 U/L (ref 38–126)
BUN: 13 mg/dL (ref 6–20)
CHLORIDE: 102 mmol/L (ref 101–111)
CO2: 24 mmol/L (ref 22–32)
Calcium: 9 mg/dL (ref 8.9–10.3)
Creatinine, Ser: 0.66 mg/dL (ref 0.44–1.00)
GFR calc Af Amer: >60 mL/min (ref >60)
GFR calc non Af Amer: >60 mL/min (ref >60)
Glucose, Bld: 97 mg/dL (ref 65–99)
POTASSIUM: 3.3 mmol/L — AB (ref 3.5–5.1)
SODIUM: 136 mmol/L (ref 135–145)
Total Bilirubin: 0.7 mg/dL (ref 0.3–1.2)
Total Protein: 6.6 g/dL (ref 6.5–8.1)

## 2017-06-08 LAB — URINALYSIS, ROUTINE W REFLEX MICROSCOPIC
Bilirubin Urine: NEGATIVE
GLUCOSE, UA: NEGATIVE mg/dL
Hgb urine dipstick: NEGATIVE
KETONES UR: 20 mg/dL — AB
Leukocytes, UA: NEGATIVE
Nitrite: NEGATIVE
PROTEIN: NEGATIVE mg/dL
Specific Gravity, Urine: 1.014 (ref 1.005–1.030)
pH: 7 (ref 5.0–8.0)

## 2017-06-08 LAB — ETHANOL

## 2017-06-08 LAB — CBC
HCT: 49.8 % — ABNORMAL HIGH (ref 36.0–46.0)
Hemoglobin: 15.8 g/dL — ABNORMAL HIGH (ref 12.0–15.0)
MCH: 30.5 pg (ref 26.0–34.0)
MCHC: 31.7 g/dL (ref 30.0–36.0)
MCV: 96.1 fL (ref 78.0–100.0)
PLATELETS: 185 10*3/uL (ref 150–400)
RBC: 5.18 MIL/uL — ABNORMAL HIGH (ref 3.87–5.11)
RDW: 14.3 % (ref 11.5–15.5)
WBC: 7.7 10*3/uL (ref 4.0–10.5)

## 2017-06-08 LAB — PROTIME-INR
INR: 1.02
PROTHROMBIN TIME: 13.3 s (ref 11.4–15.2)

## 2017-06-08 LAB — RAPID URINE DRUG SCREEN, HOSP PERFORMED
AMPHETAMINES: NOT DETECTED
BARBITURATES: NOT DETECTED
BENZODIAZEPINES: NOT DETECTED
COCAINE: NOT DETECTED
Opiates: NOT DETECTED
TETRAHYDROCANNABINOL: NOT DETECTED

## 2017-06-08 LAB — TROPONIN I: Troponin I: <0.03 ng/mL (ref <0.03)

## 2017-06-08 LAB — APTT: APTT: 25 s (ref 24–36)

## 2017-06-08 MED ORDER — LEVETIRACETAM 500 MG PO TABS
500.0000 mg | ORAL_TABLET | Freq: Two times a day (BID) | ORAL | Status: DC
  Administered 2017-06-08 – 2017-06-13 (×10): 500 mg via ORAL
  Filled 2017-06-08 (×10): qty 1

## 2017-06-08 MED ORDER — HYDRALAZINE HCL 20 MG/ML IJ SOLN
10.0000 mg | INTRAMUSCULAR | Status: DC | PRN
  Administered 2017-06-08: 10 mg via INTRAVENOUS
  Filled 2017-06-08: qty 1

## 2017-06-08 MED ORDER — ASPIRIN 81 MG PO CHEW
324.0000 mg | CHEWABLE_TABLET | Freq: Once | ORAL | Status: AC
  Administered 2017-06-08: 324 mg via ORAL
  Filled 2017-06-08: qty 4

## 2017-06-08 MED ORDER — ENOXAPARIN SODIUM 40 MG/0.4ML ~~LOC~~ SOLN
40.0000 mg | SUBCUTANEOUS | Status: DC
  Administered 2017-06-08 – 2017-06-12 (×5): 40 mg via SUBCUTANEOUS
  Filled 2017-06-08 (×5): qty 0.4

## 2017-06-08 NOTE — ED Notes (Signed)
admitting Provider at bedside. 

## 2017-06-08 NOTE — ED Triage Notes (Signed)
Per EMS patient complains right sided weakness that started yesterday morning at 0830 am. EMS states patient's left eye drifts to left while right eye remains focused.

## 2017-06-08 NOTE — ED Provider Notes (Signed)
[facility] EMERGENCY DEPARTMENT Provider Note   CSN: [id number] Arrival date & time: 06/08/17  1349     History   Chief Complaint Chief Complaint  Patient presents with  . Weakness    HPI Shelley [name] is a 71 y.o. female.  HPI  Pt was seen at 1400.  Per EMS and pt report, c/o unknown onset and persistence of constant right sided weakness that was noticed yesterday morning at 0830am. Pt states she has a hx of "left eye troubles since I was a child" but "it seems worse" since yesterday. Pt also states she "hasn't been walking right" and "needs help to walk" since yesterday. Denies any new symptoms today, only continued symptoms from yesterday. Denies dysphagia, no slurred speech, no tingling/numbness in extremities, no headache, no CP/SOB, no abd pain, no N/V/D, no fevers, no rash.   Past Medical History:  Diagnosis Date  . Alcohol abuse    drinks awhile   . Anxiety   . CVA (cerebral infarction)    pt denies  . Hypertension   . Osteoporosis   . Seizures [facility])     Patient Active Problem List   Diagnosis Date Noted  . Encounter for screening colonoscopy 05/14/2012    Past Surgical History:  Procedure Laterality Date  . ABDOMINAL HYSTERECTOMY    . BREAST LUMPECTOMY    . COLONOSCOPY WITH PROPOFOL  06/04/2012   Procedure: COLONOSCOPY WITH PROPOFOL;  Surgeon: Danie Binder, MD;  Location: [facility];  Service: Endoscopy;  Laterality: N/A;  entered cecum @ 0752; total cecal withdrawal time = 13 minutes  . EMPYEMA DRAINAGE    . FLEXIBLE SIGMOIDOSCOPY  03/14/99   internal hemorrhoids  . OOPHORECTOMY    . POLYPECTOMY  06/04/2012   Procedure: POLYPECTOMY;  Surgeon: Danie Binder, MD;  Location: [facility];  Service: Endoscopy;  Laterality: N/A;  descending colon polyps x2; rectal polyp x1    OB History    No data available       Home Medications    Prior to Admission medications   Medication Sig Start Date End Date Taking? Authorizing Provider  ALPRAZolam Duanne Moron) 1 MG  tablet Take 1 mg by mouth 2 (two) times daily as needed. Anxiety    [provider]  aspirin 81 MG tablet Take 81 mg by mouth daily.    [provider]  citalopram (CELEXA) 20 MG tablet Take 20 mg by mouth daily.    [provider]  HYDROcodone-acetaminophen (NORCO/VICODIN) 5-325 MG per tablet Take 1 tablet by mouth every 4 (four) hours as needed for pain. 08/22/12   Prentiss Bells, MD  levETIRAcetam (KEPPRA) 500 MG tablet Take 500 mg by mouth 2 (two) times daily.    [provider]  lisinopril (PRINIVIL,ZESTRIL) 10 MG tablet Take 10 mg by mouth daily.    [provider]  Multiple Vitamins-Minerals (CENTRUM SILVER PO) Take 1 tablet by mouth daily.     [provider]    Family History Family History  Problem Relation Age of Onset  . Colon cancer Neg Hx     Social History Social History   Tobacco Use  . Smoking status: Current Some Day Smoker    Packs/day: 0.50    Types: Cigarettes  . Smokeless tobacco: Never Used  Substance Use Topics  . Alcohol use: No    Comment: hx of ETOH abuse  . Drug use: No     Allergies   Patient has no known allergies.  Review of Systems Review of Systems ROS: Statement: All systems negative except as marked or noted in the HPI; Constitutional: Negative for fever and chills. ; ; Eyes: Negative for eye pain, redness and discharge. +"eye trouble"; ; ENMT: Negative for ear pain, hoarseness, nasal congestion, sinus pressure and sore throat. ; ; Cardiovascular: Negative for chest pain, palpitations, diaphoresis, dyspnea and peripheral edema. ; ; Respiratory: Negative for cough, wheezing and stridor. ; ; Gastrointestinal: Negative for nausea, vomiting, diarrhea, abdominal pain, blood in stool, hematemesis, jaundice and rectal bleeding. . ; ; Genitourinary: Negative for dysuria, flank pain and hematuria. ; ; Musculoskeletal: Negative for back pain and neck pain. Negative for swelling and trauma.; ; Skin:  Negative for pruritus, rash, abrasions, blisters, bruising and skin lesion.; ; Neuro: +RUE and RLE weakness, "not walking right." Negative for headache, lightheadedness and neck stiffness. Negative for weakness, altered level of consciousness, altered mental status, paresthesias, involuntary movement, seizure and syncope.       Physical Exam Updated Vital Signs BP (!) 173/95 (BP Location: Left Arm)   Pulse 83   Temp 97.7 F (36.5 C) (Oral)   Resp 16   SpO2 96%   Physical Exam 1405: Physical examination:  Nursing notes reviewed; Vital signs and O2 SAT reviewed;  Constitutional: Well developed, Well nourished, Well hydrated, In no acute distress; Head:  Normocephalic, atraumatic; Eyes: EOMI right eye. +left eye does not pass midline medially when EOM examined.  No scleral icterus; ENMT: Mouth and pharynx normal, Mucous membranes moist; Neck: Supple, Full range of motion, No lymphadenopathy; Cardiovascular: Regular rate and rhythm, No gallop; Respiratory: Breath sounds clear & equal bilaterally, No wheezes.  Speaking full sentences with ease, Normal respiratory effort/excursion; Chest: Nontender, Movement normal; Abdomen: Soft, Nontender, Nondistended, Normal bowel sounds; Genitourinary: No CVA tenderness; Extremities: Pulses normal, No tenderness, No edema, No calf edema or asymmetry.; Neuro: AA&Ox3, +left eye does not pass midline medially during EOM exam. Major CN grossly intact. Speech clear.  No facial droop.  No nystagmus. Grips equal. Strength 5/5 LUE and LLE, strength 4/5 RLE and RUE.  DTR 2/4 equal bilat UE's and LE's.  No gross sensory deficits.  Normal cerebellar testing bilat UE's (finger-nose) and LE's (heel-shin)..; Skin: Color normal, Warm, Dry.   ED Treatments / Results  Labs (all labs ordered are listed, but only abnormal results are displayed)   EKG  EKG Interpretation  Date/Time:  Friday June 08 2017 14:21:03 EST Ventricular Rate:  78 PR Interval:  156 QRS  Duration: 91 QT Interval:  404 QTC Calculation: 461 R Axis:   12 Text Interpretation:  Sinus rhythm Abnormal R-wave progression, early transition Borderline T abnormalities, anterior leads When compared with ECG of 05/28/2012 abnormal R-wave progression is now present Confirmed by Francine Graven [phone]) on 06/08/2017 2:25:01 PM       Radiology   Procedures Procedures (including critical care time)  Medications Ordered in ED Medications - No data to display   Initial Impression / Assessment and Plan / ED Course  I have reviewed the triage vital signs and the nursing notes.  Pertinent labs & imaging results that were available during my care of the patient were reviewed by me and considered in my medical decision making (see chart for details).  MDM Reviewed: previous chart, nursing note and vitals Reviewed previous: labs and ECG Interpretation: labs, ECG, CT scan and x-ray    Results for orders placed or performed during the hospital encounter of 06/08/17  Ethanol  Result Value Ref Range  Alcohol, Ethyl (B) <10 <10 mg/dL  Protime-INR  Result Value Ref Range   Prothrombin Time 13.3 11.4 - 15.2 seconds   INR 1.02   APTT  Result Value Ref Range   aPTT 25 24 - 36 seconds  CBC  Result Value Ref Range   WBC 7.7 4.0 - 10.5 K/uL   RBC 5.18 (H) 3.87 - 5.11 MIL/uL   Hemoglobin 15.8 (H) 12.0 - 15.0 g/dL   HCT 49.8 (H) 36.0 - 46.0 %   MCV 96.1 78.0 - 100.0 fL   MCH 30.5 26.0 - 34.0 pg   MCHC 31.7 30.0 - 36.0 g/dL   RDW 14.3 11.5 - 15.5 %   Platelets 185 150 - 400 K/uL  Differential  Result Value Ref Range   Neutrophils Relative % 73 %   Neutro Abs 5.6 1.7 - 7.7 K/uL   Lymphocytes Relative 21 %   Lymphs Abs 1.6 0.7 - 4.0 K/uL   Monocytes Relative 6 %   Monocytes Absolute 0.4 0.1 - 1.0 K/uL   Eosinophils Relative 0 %   Eosinophils Absolute 0.0 0.0 - 0.7 K/uL   Basophils Relative 0 %   Basophils Absolute 0.0 0.0 - 0.1 K/uL  Comprehensive metabolic panel  Result  Value Ref Range   Sodium 136 135 - 145 mmol/L   Potassium 3.3 (L) 3.5 - 5.1 mmol/L   Chloride 102 101 - 111 mmol/L   CO2 24 22 - 32 mmol/L   Glucose, Bld 97 65 - 99 mg/dL   BUN 13 6 - 20 mg/dL   Creatinine, Ser 0.66 0.44 - 1.00 mg/dL   Calcium 9.0 8.9 - 10.3 mg/dL   Total Protein 6.6 6.5 - 8.1 g/dL   Albumin 3.8 3.5 - 5.0 g/dL   AST 20 15 - 41 U/L   ALT 11 (L) 14 - 54 U/L   Alkaline Phosphatase 109 38 - 126 U/L   Total Bilirubin 0.7 0.3 - 1.2 mg/dL   GFR calc non Af Amer >60 >60 mL/min   GFR calc Af Amer >60 >60 mL/min   Anion gap 10 5 - 15  Troponin I  Result Value Ref Range   Troponin I <0.03 <0.03 ng/mL    Dg Chest 1 View Result Date: 06/08/2017 CLINICAL DATA:  Right-sided weakness. EXAM: CHEST 1 VIEW COMPARISON:  06/14/2010.  03/11/2004.  CT 10/16/2001. FINDINGS: Mediastinum and hilar structures normal. Cardiomegaly with normal pulmonary vascularity. Density is noted right upper lobe. Although this could represent focal mild infiltrate, focal mass lesion cannot be excluded. Follow-up PA and lateral chest x-ray suggested. This density does not clear nonenhanced chest CT suggested. Mild cardiomegaly. No pulmonary venous congestion. Small bilateral pleural effusions. No pneumothorax. Degenerative changes scoliosis thoracic spine . IMPRESSION: 1. Small ill-defined density is in the right upper lobe. Although this could represent a focal infiltrate, focal mass lesion cannot be excluded . Close follow-up PA lateral chest x-ray suggested. This density does not clear nonenhanced chest CT suggested . 2. Small bilateral pleural effusions. 3. Mild cardiomegaly.  No pulmonary venous congestion. Electronically Signed   By: Marcello Moores  Register   On: 06/08/2017 14:58   Ct Head Wo Contrast Result Date: 06/08/2017 CLINICAL DATA:  Focal neural deficit. EXAM: CT HEAD WITHOUT CONTRAST TECHNIQUE: Contiguous axial images were obtained from the base of the skull through the vertex without intravenous contrast.  COMPARISON:  CT scan of June 14, 2010. FINDINGS: Brain: Mild diffuse cortical atrophy is noted. Mild chronic ischemic white matter disease is noted.  Old bilateral alignment lacunar infarctions are noted. No mass effect or midline shift is noted. Ventricular size is within normal limits. There is no evidence of mass lesion, hemorrhage or acute infarction. Vascular: No hyperdense vessel or unexpected calcification. Skull: Normal. Negative for fracture or focal lesion. Sinuses/Orbits: Mild right ethmoid sinusitis is noted. Other: None. IMPRESSION: Mild diffuse cortical atrophy. Mild chronic ischemic white matter disease. No acute intracranial abnormality seen. Electronically Signed   By: Marijo Conception, M.D.   On: 06/08/2017 15:06    1535:  UA and MRI brain pending. Will need to trial ambulation. Sign out to Dr. Moeser Singer.      Final Clinical Impressions(s) / ED Diagnoses   Final diagnoses:  None    ED Discharge Orders    None       Francine Graven, DO 06/08/17 1538

## 2017-06-08 NOTE — ED Notes (Signed)
Pt took 2 steps In room towards the door.  Pt had very difficult time ambulating.  Pt legs shaking and pt leaning to the right.

## 2017-06-08 NOTE — H&P (Signed)
History and Physical  Shelley [name] RXV:[id number] DOB: 1946-01-11 DOA: 06/08/2017  Referring physician: Dr Cajamarca Singer, ED physician PCP: [name], MD  Outpatient Specialists: None  Patient Coming From: home  Chief Complaint: diplopia  HPI: Shelley [name] is a 71 y.o. female with a history of hypertension, anxiety, seizure disorder.  Patient reports waking up yesterday morning with diplopia.  Her husband noted that her left eye was deviated upward and lateral.  Patient also appeared to have some difficulty walking as her right legs appeared heavy.  Her symptoms did not improve today, therefore the patient presented to the hospital for evaluation.  No palliating or provoking factors.  Her course is unchanging.  Denies dysphasia, slurred speech, numbness, tingling, headache, shortness of breath, chest pain, leg pain.  Emergency Department Course: CT of the head showed mild chronic ischemic white matter disease with no acute intracranial abnormality.  MRI showed acute lacunar infarct in left dorsal pons, likely affecting the left medial longitudinal fasciculus and acute lacunar infarcts in the right cerebellum and right thalamus  Review of Systems:   Pt denies any fevers, chills, nausea, vomiting, diarrhea, constipation, abdominal pain, shortness of breath, dyspnea on exertion, orthopnea, cough, wheezing, palpitations, headache, vision changes, lightheadedness, dizziness, melena, rectal bleeding.  Review of systems are otherwise negative  Past Medical History:  Diagnosis Date  . Alcohol abuse    drinks awhile   . Anxiety   . CVA (cerebral infarction)    pt denies  . Hypertension   . Osteoporosis   . Seizures ([place])    Past Surgical History:  Procedure Laterality Date  . ABDOMINAL HYSTERECTOMY    . BREAST LUMPECTOMY    . COLONOSCOPY WITH PROPOFOL  06/04/2012   Procedure: COLONOSCOPY WITH PROPOFOL;  Surgeon: Danie Binder, MD;  Location: [facility];  Service: Endoscopy;  Laterality: N/A;   entered cecum @ 0752; total cecal withdrawal time = 13 minutes  . EMPYEMA DRAINAGE    . FLEXIBLE SIGMOIDOSCOPY  03/14/99   internal hemorrhoids  . OOPHORECTOMY    . POLYPECTOMY  06/04/2012   Procedure: POLYPECTOMY;  Surgeon: Danie Binder, MD;  Location: [facility];  Service: Endoscopy;  Laterality: N/A;  descending colon polyps x2; rectal polyp x1   Social History:  reports that she has been smoking cigarettes.  She has been smoking about 0.50 packs per day. she has never used smokeless tobacco. She reports that she does not drink alcohol or use drugs. Patient lives at home  No Known Allergies  Family History  Problem Relation Age of Onset  . Colon cancer Neg Hx       Prior to Admission medications   Medication Sig Start Date End Date Taking? Authorizing Provider  aspirin 81 MG tablet Take 81 mg by mouth daily.   Yes [provider]  levETIRAcetam (KEPPRA) 500 MG tablet Take 500 mg by mouth 2 (two) times daily.   Yes [provider]  lisinopril (PRINIVIL,ZESTRIL) 10 MG tablet Take 10 mg by mouth daily.   Yes [provider]    Physical Exam: BP (!) 165/99   Pulse 84   Temp 98.2 F (36.8 C) (Oral)   Resp (!) 25   SpO2 96%   General: Elderly Caucasian female. Awake and alert and oriented x3. No acute cardiopulmonary distress.  HEENT: Normocephalic atraumatic.  Right and left ears normal in appearance.  Pupils equal, round, reactive to light. Extraocular muscles are intact. Sclerae anicteric and noninjected.  Moist mucosal membranes.  No mucosal lesions.  Neck: Neck supple without lymphadenopathy. No carotid bruits. No masses palpated.  Cardiovascular: Regular rate with normal S1-S2 sounds. No murmurs, rubs, gallops auscultated. No JVD.  Respiratory: Good respiratory effort with no wheezes, rales, rhonchi. Lungs clear to auscultation bilaterally.  No accessory muscle use. Abdomen: Soft, nontender, nondistended. Active bowel sounds. No masses or  hepatosplenomegaly  Skin: No rashes, lesions, or ulcerations.  Dry, warm to touch. 2+ dorsalis pedis and radial pulses. Musculoskeletal: No calf or leg pain. All major joints not erythematous nontender.  No upper or lower joint deformation.  Good ROM.  No contractures  Psychiatric: Intact judgment and insight. Pleasant and cooperative. Neurologic: Left eye upward lateral gaze with mild improvement with intention of looking medially.  Slight ptosis.  No other cranial nerve deficits observed.  Does have central ataxia and over time body shift to the right when sitting without assistance.  Strength is 5/5 and symmetric in upper and lower extremities.  Coordination intact.           Labs on Admission: I have personally reviewed following labs and imaging studies  CBC: Recent Labs  Lab 06/08/17 1412  WBC 7.7  NEUTROABS 5.6  HGB 15.8*  HCT 49.8*  MCV 96.1  PLT 160   Basic Metabolic Panel: Recent Labs  Lab 06/08/17 1412  NA 136  K 3.3*  CL 102  CO2 24  GLUCOSE 97  BUN 13  CREATININE 0.66  CALCIUM 9.0   GFR: CrCl cannot be calculated (Unknown ideal weight.). Liver Function Tests: Recent Labs  Lab 06/08/17 1412  AST 20  ALT 11*  ALKPHOS 109  BILITOT 0.7  PROT 6.6  ALBUMIN 3.8   No results for input(s): LIPASE, AMYLASE in the last 168 hours. No results for input(s): AMMONIA in the last 168 hours. Coagulation Profile: Recent Labs  Lab 06/08/17 1412  INR 1.02   Cardiac Enzymes: Recent Labs  Lab 06/08/17 1412  TROPONINI <0.03   BNP (last 3 results) No results for input(s): PROBNP in the last 8760 hours. HbA1C: No results for input(s): HGBA1C in the last 72 hours. CBG: No results for input(s): GLUCAP in the last 168 hours. Lipid Profile: No results for input(s): CHOL, HDL, LDLCALC, TRIG, CHOLHDL, LDLDIRECT in the last 72 hours. Thyroid Function Tests: No results for input(s): TSH, T4TOTAL, FREET4, T3FREE, THYROIDAB in the last 72 hours. Anemia Panel: No  results for input(s): VITAMINB12, FOLATE, FERRITIN, TIBC, IRON, RETICCTPCT in the last 72 hours. Urine analysis:    Component Value Date/Time   COLORURINE YELLOW 06/08/2017 2055   APPEARANCEUR HAZY (A) 06/08/2017 2055   LABSPEC 1.014 06/08/2017 2055   PHURINE 7.0 06/08/2017 2055   GLUCOSEU NEGATIVE 06/08/2017 2055   HGBUR NEGATIVE 06/08/2017 2055   BILIRUBINUR NEGATIVE 06/08/2017 2055   KETONESUR 20 (A) 06/08/2017 2055   PROTEINUR NEGATIVE 06/08/2017 2055   UROBILINOGEN 0.2 06/14/2010 0741   NITRITE NEGATIVE 06/08/2017 2055   LEUKOCYTESUR NEGATIVE 06/08/2017 2055   Sepsis Labs: @LABRCNTIP (procalcitonin:4,lacticidven:4) )No results found for this or any previous visit (from the past 240 hour(s)).   Radiological Exams on Admission: Dg Chest 1 View  Result Date: 06/08/2017 CLINICAL DATA:  Right-sided weakness. EXAM: CHEST 1 VIEW COMPARISON:  06/14/2010.  03/11/2004.  CT 10/16/2001. FINDINGS: Mediastinum and hilar structures normal. Cardiomegaly with normal pulmonary vascularity. Density is noted right upper lobe. Although this could represent focal mild infiltrate, focal mass lesion cannot be excluded. Follow-up PA and lateral chest x-ray suggested. This density does not clear  nonenhanced chest CT suggested. Mild cardiomegaly. No pulmonary venous congestion. Small bilateral pleural effusions. No pneumothorax. Degenerative changes scoliosis thoracic spine . IMPRESSION: 1. Small ill-defined density is in the right upper lobe. Although this could represent a focal infiltrate, focal mass lesion cannot be excluded . Close follow-up PA lateral chest x-ray suggested. This density does not clear nonenhanced chest CT suggested . 2. Small bilateral pleural effusions. 3. Mild cardiomegaly.  No pulmonary venous congestion. Electronically Signed   By: Marcello Moores  Register   On: 06/08/2017 14:58   Ct Head Wo Contrast  Result Date: 06/08/2017 CLINICAL DATA:  Focal neural deficit. EXAM: CT HEAD WITHOUT CONTRAST  TECHNIQUE: Contiguous axial images were obtained from the base of the skull through the vertex without intravenous contrast. COMPARISON:  CT scan of June 14, 2010. FINDINGS: Brain: Mild diffuse cortical atrophy is noted. Mild chronic ischemic white matter disease is noted. Old bilateral alignment lacunar infarctions are noted. No mass effect or midline shift is noted. Ventricular size is within normal limits. There is no evidence of mass lesion, hemorrhage or acute infarction. Vascular: No hyperdense vessel or unexpected calcification. Skull: Normal. Negative for fracture or focal lesion. Sinuses/Orbits: Mild right ethmoid sinusitis is noted. Other: None. IMPRESSION: Mild diffuse cortical atrophy. Mild chronic ischemic white matter disease. No acute intracranial abnormality seen. Electronically Signed   By: Marijo Conception, M.D.   On: 06/08/2017 15:06   Mr Brain Wo Contrast (neuro Protocol)  Result Date: 06/08/2017 CLINICAL DATA:  71 year old female with right-sided weakness since yesterday morning. Ophthalmoplegia. EXAM: MRI HEAD WITHOUT CONTRAST TECHNIQUE: Multiplanar, multiecho pulse sequences of the brain and surrounding structures were obtained without intravenous contrast. COMPARISON:  Head CT without contrast 1448 hours today. Brain MRI 06/15/2010. FINDINGS: Brain: Cerebral volume loss since 2011 appears generalized. Progressed chronic small vessel disease with increased size and number of chronic lacunar infarcts in the bilateral deep gray matter nuclei and pons. Chronically advanced bilateral cerebral white matter T2 and FLAIR hyperintensity is also more patchy and confluent. No definite chronic cerebral blood products or cortical encephalomalacia identified. There is a linear 14 mm area of restricted diffusion in the central right cerebellum with faint FLAIR hyperintensity. No hemorrhage or mass effect. There is a second punctate area of restricted diffusion along the ventral, superior left aspect  of the fourth ventricle (series 3, image 76 and series 5, image 52. Associated T2 and FLAIR hyperintensity with no hemorrhage or mass effect. There is also a punctate area of restricted diffusion in the central right thalamus (series 5, image 53). No hemorrhage or mass effect. This is in an area of patchy T2 and FLAIR hyperintensity. No other restricted diffusion. No midline shift, mass effect, evidence of mass lesion, ventriculomegaly, extra-axial collection or acute intracranial hemorrhage. Cervicomedullary junction and pituitary are within normal limits. Vascular: Major intracranial vascular flow voids are stable since 2011. Skull and upper cervical spine: Negative visualized cervical spine. Normal bone marrow signal. Sinuses/Orbits: Leftward gaze deviation. Otherwise normal orbits soft tissues. Chronic mild mastoid effusions and paranasal sinus mucosal thickening is stable since 20/11. Other: Scalp and orbits soft tissues appear negative. IMPRESSION: 1. Acute lacunar infarct in the left dorsal pons, likely affecting the left medial longitudinal fasciculus (MLF) in this clinical setting. 2. Acute lacunar infarcts also in the right cerebellum and right thalamus. 3. No intracranial hemorrhage or mass effect. 4. 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 Electronically Signed   By: Genevie Ann M.D.   On:  06/08/2017 16:29    EKG: Independently reviewed.  Sinus rhythm with abnormal R wave progression.  Assessment/Plan: Principal Problem:   Acute lacunar stroke [facility]) Active Problems:   Hypertension   Anxiety   Seizures ([place])   Diplopia    This patient was discussed with the ED physician, including pertinent vitals, physical exam findings, labs, and imaging.  We also discussed care given by the ED provider.  1.  Acute lacunar infarcts  Admit to [facility] Observation on telemetry MRA head/neck  Echocardiogram tomorrow Hemoglobin A1c, lipid panel in the  morning PT/OT/speech therapy consult Full aspirin 2.  Diplopia  Secondary to infarct 3.  Hypertension  Permissive hypertension  Hold antihypertensive 4.  Anxiety  Anxiolytic for MRI 5.  Seizures  Continue Keppra  DVT prophylaxis: Lovenox Consultants: Neurology Code Status: Full code Family Communication: Husband in the room Disposition Plan: Patient to return home following admission   Jacob Moores [facility] Pager [phone]  If 7PM-7AM, please contact night-coverage [email or web address] Password [email or web address]

## 2017-06-08 NOTE — ED Notes (Signed)
Pt sent to MRI at this time.

## 2017-06-09 ENCOUNTER — Inpatient Hospital Stay (HOSPITAL_COMMUNITY): Payer: Medicare Other

## 2017-06-09 DIAGNOSIS — E876 Hypokalemia: Secondary | ICD-10-CM | POA: Diagnosis not present

## 2017-06-09 DIAGNOSIS — Z7982 Long term (current) use of aspirin: Secondary | ICD-10-CM | POA: Diagnosis not present

## 2017-06-09 DIAGNOSIS — I739 Peripheral vascular disease, unspecified: Secondary | ICD-10-CM | POA: Diagnosis not present

## 2017-06-09 DIAGNOSIS — I6381 Other cerebral infarction due to occlusion or stenosis of small artery: Secondary | ICD-10-CM | POA: Diagnosis not present

## 2017-06-09 DIAGNOSIS — I471 Supraventricular tachycardia: Secondary | ICD-10-CM | POA: Diagnosis not present

## 2017-06-09 DIAGNOSIS — R29700 NIHSS score 0: Secondary | ICD-10-CM | POA: Diagnosis not present

## 2017-06-09 DIAGNOSIS — Z888 Allergy status to other drugs, medicaments and biological substances status: Secondary | ICD-10-CM | POA: Diagnosis not present

## 2017-06-09 DIAGNOSIS — M810 Age-related osteoporosis without current pathological fracture: Secondary | ICD-10-CM | POA: Diagnosis not present

## 2017-06-09 DIAGNOSIS — H5122 Internuclear ophthalmoplegia, left eye: Secondary | ICD-10-CM | POA: Diagnosis not present

## 2017-06-09 DIAGNOSIS — G319 Degenerative disease of nervous system, unspecified: Secondary | ICD-10-CM | POA: Diagnosis not present

## 2017-06-09 DIAGNOSIS — H532 Diplopia: Secondary | ICD-10-CM | POA: Diagnosis not present

## 2017-06-09 DIAGNOSIS — R9082 White matter disease, unspecified: Secondary | ICD-10-CM | POA: Diagnosis not present

## 2017-06-09 DIAGNOSIS — I5030 Unspecified diastolic (congestive) heart failure: Secondary | ICD-10-CM

## 2017-06-09 DIAGNOSIS — I110 Hypertensive heart disease with heart failure: Secondary | ICD-10-CM | POA: Diagnosis not present

## 2017-06-09 DIAGNOSIS — E785 Hyperlipidemia, unspecified: Secondary | ICD-10-CM | POA: Diagnosis not present

## 2017-06-09 LAB — ECHOCARDIOGRAM COMPLETE

## 2017-06-09 LAB — HEMOGLOBIN A1C
Hgb A1c MFr Bld: 5.6 % (ref 4.8–5.6)
MEAN PLASMA GLUCOSE: 114.02 mg/dL

## 2017-06-09 LAB — LIPID PANEL
CHOL/HDL RATIO: 2.9 ratio
Cholesterol: 177 mg/dL (ref 0–200)
HDL: 61 mg/dL (ref >40)
LDL CALC: 101 mg/dL — AB (ref 0–99)
Triglycerides: 74 mg/dL (ref <150)
VLDL: 15 mg/dL (ref 0–40)

## 2017-06-09 MED ORDER — ACETAMINOPHEN 160 MG/5ML PO SOLN: 650.0000 mg | ORAL | Status: DC | PRN

## 2017-06-09 MED ORDER — ALBUTEROL SULFATE (2.5 MG/3ML) 0.083% IN NEBU: 2.5000 mg | INHALATION_SOLUTION | Freq: Four times a day (QID) | RESPIRATORY_TRACT | Status: DC | PRN

## 2017-06-09 MED ORDER — STROKE: EARLY STAGES OF RECOVERY BOOK
Freq: Once | Status: AC
  Administered 2017-06-09: 10:00:00
  Filled 2017-06-09: qty 1

## 2017-06-09 MED ORDER — ACETAMINOPHEN 650 MG RE SUPP: 650.0000 mg | RECTAL | Status: DC | PRN

## 2017-06-09 MED ORDER — ASPIRIN 325 MG PO TABS
325.0000 mg | ORAL_TABLET | Freq: Every day | ORAL | Status: DC
  Administered 2017-06-09 – 2017-06-13 (×5): 325 mg via ORAL
  Filled 2017-06-09 (×6): qty 1

## 2017-06-09 MED ORDER — SENNOSIDES-DOCUSATE SODIUM 8.6-50 MG PO TABS
1.0000 | ORAL_TABLET | Freq: Every evening | ORAL | Status: DC | PRN
  Administered 2017-06-11: 1 via ORAL
  Filled 2017-06-09 (×2): qty 1

## 2017-06-09 MED ORDER — IPRATROPIUM-ALBUTEROL 0.5-2.5 (3) MG/3ML IN SOLN: 3.0000 mL | Freq: Four times a day (QID) | RESPIRATORY_TRACT | Status: DC | PRN

## 2017-06-09 MED ORDER — ACETAMINOPHEN 325 MG PO TABS
650.0000 mg | ORAL_TABLET | ORAL | Status: DC | PRN
  Administered 2017-06-09: 650 mg via ORAL
  Filled 2017-06-09: qty 2

## 2017-06-09 MED ORDER — ASPIRIN 300 MG RE SUPP: 300.0000 mg | Freq: Every day | RECTAL | Status: DC

## 2017-06-09 NOTE — Consult Note (Signed)
Requesting Physician: Dr. Rodena Piety     Chief Complaint: Double vision   History obtained from: Patient and Chart   HPI:                                                                                                                                       Shelley [name] is an 71 y.o. female Caucasian with past medical history of anxiety, hypertension, seizures who is being transferred from a hospital for stroke workup.   Patient reports waking up on 12 7.18 morning  with diplopia.  Her husband noted that her left eye was deviated upward and lateral.  Patient also appeared to have some difficulty walking.  Her symptoms did not improve today, therefore the patient presented to the [facility] for evaluation. MRI showed multiple infarcts and patient was transported to [facility] course further stroke workup.  Date last known well: 12.7.18 tPA Given: no, outside window  NIHSS: 0 Baseline MRS 0   Past Medical History:  Diagnosis Date  . Alcohol abuse    drinks awhile   . Anxiety   . CVA (cerebral infarction)    pt denies  . Hypertension   . Osteoporosis   . Seizures ([place])     Past Surgical History:  Procedure Laterality Date  . ABDOMINAL HYSTERECTOMY    . BREAST LUMPECTOMY    . COLONOSCOPY WITH PROPOFOL  06/04/2012   Procedure: COLONOSCOPY WITH PROPOFOL;  Surgeon: Danie Binder, MD;  Location: [facility];  Service: Endoscopy;  Laterality: N/A;  entered cecum @ 0752; total cecal withdrawal time = 13 minutes  . EMPYEMA DRAINAGE    . FLEXIBLE SIGMOIDOSCOPY  03/14/99   internal hemorrhoids  . OOPHORECTOMY    . POLYPECTOMY  06/04/2012   Procedure: POLYPECTOMY;  Surgeon: Danie Binder, MD;  Location: [facility];  Service: Endoscopy;  Laterality: N/A;  descending colon polyps x2; rectal polyp x1    Family History  Problem Relation Age of Onset  . Colon cancer Neg Hx    Social History:  reports that she has been smoking cigarettes.  She has been smoking about 0.50 packs per  day. she has never used smokeless tobacco. She reports that she does not drink alcohol or use drugs.  Allergies: No Known Allergies  Medications:  I reviewed home medications. She is on aspirin and Keppra   ROS:                                                                                                                                     14 systems reviewed and negative except above   Examination:                                                                                                      General: Appears well-developed and well-nourished.  Psych: Affect appropriate to situation Eyes: No scleral injection HENT: No OP obstrucion Head: Normocephalic.  Cardiovascular: Normal rate and regular rhythm.  Respiratory: Effort normal and breath sounds normal to anterior ascultation GI: Soft.  No distension. There is no tenderness.  Skin: WDI   Neurological Examination Mental Status: Alert, oriented, thought content appropriate.  Speech fluent without evidence of aphasia. Able to follow 3 step commands without difficulty. Cranial Nerves: II: Visual fields grossly normal,  III,IV, VI: Patient has left eye intranuclear ophthalmoplegia. V,VII: smile symmetric, facial light touch sensation normal bilaterally VIII: hearing normal bilaterally IX,X: uvula rises symmetrically XI: bilateral shoulder shrug XII: midline tongue extension Motor: Right : Upper extremity   5/5    Left:     Upper extremity   5/5  Lower extremity   5/5     Lower extremity   5/5 Tone and bulk:normal tone throughout; no atrophy noted Sensory: Stocking distribution measures sensation over both legs Deep Tendon Reflexes: 1+ over bilateral patella, 2+ biceps Plantars: Right: downgoing   Left: downgoing Cerebellar: normal finger-to-nose, normal rapid alternating movements and normal  heel-to-shin test Gait: normal gait and station     Lab Results: Basic Metabolic Panel: Recent Labs  Lab 06/08/17 1412  NA 136  K 3.3*  CL 102  CO2 24  GLUCOSE 97  BUN 13  CREATININE 0.66  CALCIUM 9.0    CBC: Recent Labs  Lab 06/08/17 1412  WBC 7.7  NEUTROABS 5.6  HGB 15.8*  HCT 49.8*  MCV 96.1  PLT 185    Coagulation Studies: Recent Labs    06/08/17 1412  LABPROT 13.3  INR 1.02    Imaging: Dg Chest 1 View  Result Date: 06/08/2017 CLINICAL DATA:  Right-sided weakness. EXAM: CHEST 1 VIEW COMPARISON:  06/14/2010.  03/11/2004.  CT 10/16/2001. FINDINGS: Mediastinum and hilar structures normal. Cardiomegaly with normal pulmonary vascularity. Density is noted right upper lobe. Although this could represent focal mild infiltrate, focal mass lesion  cannot be excluded. Follow-up PA and lateral chest x-ray suggested. This density does not clear nonenhanced chest CT suggested. Mild cardiomegaly. No pulmonary venous congestion. Small bilateral pleural effusions. No pneumothorax. Degenerative changes scoliosis thoracic spine . IMPRESSION: 1. Small ill-defined density is in the right upper lobe. Although this could represent a focal infiltrate, focal mass lesion cannot be excluded . Close follow-up PA lateral chest x-ray suggested. This density does not clear nonenhanced chest CT suggested . 2. Small bilateral pleural effusions. 3. Mild cardiomegaly.  No pulmonary venous congestion. Electronically Signed   By: Marcello Moores  Register   On: 06/08/2017 14:58   Ct Head Wo Contrast  Result Date: 06/08/2017 CLINICAL DATA:  Focal neural deficit. EXAM: CT HEAD WITHOUT CONTRAST TECHNIQUE: Contiguous axial images were obtained from the base of the skull through the vertex without intravenous contrast. COMPARISON:  CT scan of June 14, 2010. FINDINGS: Brain: Mild diffuse cortical atrophy is noted. Mild chronic ischemic white matter disease is noted. Old bilateral alignment lacunar infarctions are  noted. No mass effect or midline shift is noted. Ventricular size is within normal limits. There is no evidence of mass lesion, hemorrhage or acute infarction. Vascular: No hyperdense vessel or unexpected calcification. Skull: Normal. Negative for fracture or focal lesion. Sinuses/Orbits: Mild right ethmoid sinusitis is noted. Other: None. IMPRESSION: Mild diffuse cortical atrophy. Mild chronic ischemic white matter disease. No acute intracranial abnormality seen. Electronically Signed   By: Marijo Conception, M.D.   On: 06/08/2017 15:06   Mr Brain Wo Contrast (neuro Protocol)  Result Date: 06/08/2017 CLINICAL DATA:  71 year old female with right-sided weakness since yesterday morning. Ophthalmoplegia. EXAM: MRI HEAD WITHOUT CONTRAST TECHNIQUE: Multiplanar, multiecho pulse sequences of the brain and surrounding structures were obtained without intravenous contrast. COMPARISON:  Head CT without contrast 1448 hours today. Brain MRI 06/15/2010. FINDINGS: Brain: Cerebral volume loss since 2011 appears generalized. Progressed chronic small vessel disease with increased size and number of chronic lacunar infarcts in the bilateral deep gray matter nuclei and pons. Chronically advanced bilateral cerebral white matter T2 and FLAIR hyperintensity is also more patchy and confluent. No definite chronic cerebral blood products or cortical encephalomalacia identified. There is a linear 14 mm area of restricted diffusion in the central right cerebellum with faint FLAIR hyperintensity. No hemorrhage or mass effect. There is a second punctate area of restricted diffusion along the ventral, superior left aspect of the fourth ventricle (series 3, image 76 and series 5, image 52. Associated T2 and FLAIR hyperintensity with no hemorrhage or mass effect. There is also a punctate area of restricted diffusion in the central right thalamus (series 5, image 53). No hemorrhage or mass effect. This is in an area of patchy T2 and FLAIR  hyperintensity. No other restricted diffusion. No midline shift, mass effect, evidence of mass lesion, ventriculomegaly, extra-axial collection or acute intracranial hemorrhage. Cervicomedullary junction and pituitary are within normal limits. Vascular: Major intracranial vascular flow voids are stable since 2011. Skull and upper cervical spine: Negative visualized cervical spine. Normal bone marrow signal. Sinuses/Orbits: Leftward gaze deviation. Otherwise normal orbits soft tissues. Chronic mild mastoid effusions and paranasal sinus mucosal thickening is stable since 20/11. Other: Scalp and orbits soft tissues appear negative. IMPRESSION: 1. Acute lacunar infarct in the left dorsal pons, likely affecting the left medial longitudinal fasciculus (MLF) in this clinical setting. 2. Acute lacunar infarcts also in the right cerebellum and right thalamus. 3. No intracranial hemorrhage or mass effect. 4. Underlying advanced chronic small vessel disease with widespread  progression since 2011. Electronically Signed   By: Genevie Ann M.D.   On: 06/08/2017 16:29     ASSESSMENT AND PLAN   71 y.o. female Caucasian with past medical history of anxiety, hypertension, seizures who is being transferred from a hospital for stroke workup after having about 2 days of diplopia. Exam consistent with left INO. Interestingly,  the patient has multiple acute infarcts all areas of small vessel distribution.   Etiology: Small vessel disease vs cardioembolic  Acute Ischemic Stroke   Recommend # MRI of the brain without contrast: done  #MRA Head and neck  #Transthoracic Echo  # Start patient on ASA 325mg  daily #Start or continue Atorvastatin 80 mg/other high intensity statin # BP goal: permissive HTN upto 614 systolic, PRNs above 21 # HBAIC and Lipid profile # Telemetry monitoring # Frequent neuro checks # NPO until passes stroke swallow screen  Please page stroke NP  Or  PA  Or MD from 8am -4 pm  as this patient from this  time will be  followed by the stroke.   You can look them up on [email or web address]  Password [facility]   Mattea Seger [facility] Pager Number [phone]

## 2017-06-09 NOTE — ED Notes (Signed)
Called [facility] with bed assignment and for transport to [facility]. 

## 2017-06-09 NOTE — Progress Notes (Signed)
PROGRESS NOTE  Shelley [name]  WUJ:[id number]  DOB: [birth year]  DOA: 06/08/2017 PCP: [name], MD  Brief Admission Hx: Shelley [name] is a 71 y.o. female with a history of hypertension, anxiety, seizure disorder.  Patient reports waking up yesterday morning with diplopia.  She was admitted for acute CVA.    MDM/Assessment & Plan:   1. Acute lacunar infarcts - pt has been admitted to teleneuro bed at [facility]. Obtain neurology consult when arrives to [place]. MRI /MRA pending (unable to be done at [facility]).  Full dose aspirin ordered for antiplatelet therapy.  Check hemoglobin A1c and lipid panel.  Echocardiogram has been ordered. 2. Diplopia - acute symptom secondary to CVA. Treating as above.  Neuro consult pending.  3. Essential Hypertension - allow permissive hypertension. Holding home antihypertensives.  4. Generalized Anxiety Disorder- anxiolytics as needed. 5. Epilepsy - no recent seizures, continue home keppra.   DVT prophylaxis: lovenox Code Status: Full  Family Communication: Husband Disposition Plan: Tx to [facility] when bed available  Subjective: Pt seen early this morning and is without complaints.  She says that her vision is still blurred in the left eye.   Objective: Vitals:   06/09/17 1000 06/09/17 1100 06/09/17 1228 06/09/17 1300  BP: (!) 147/96 (!) 160/97 (!) 180/99 (!) 159/97  Pulse: 78 75 77 95  Resp: 17 20 13  (!) 22  Temp:      TempSrc:      SpO2: 97% 97% 97% 97%   No intake or output data in the 24 hours ending 06/09/17 1442 There were no vitals filed for this visit.   REVIEW OF SYSTEMS  As per history otherwise all reviewed and reported negative  Exam:  General exam: elderly female, awake, alert, oriented, no apparent distress, cooperative. Respiratory system:  No increased work of breathing. Cardiovascular system: S1 & S2 heard. No JVD, murmurs, gallops, clicks or pedal edema. Gastrointestinal system: Abdomen is nondistended, soft  and nontender. Normal bowel sounds heard. Central nervous system: Left eye upward lateral gaze. Alert and oriented. Strength is 5/5 and symmetric in upper and lower extremities. Extremities: no CCE.  Data Reviewed: Basic Metabolic Panel: Recent Labs  Lab 06/08/17 1412  NA 136  K 3.3*  CL 102  CO2 24  GLUCOSE 97  BUN 13  CREATININE 0.66  CALCIUM 9.0   Liver Function Tests: Recent Labs  Lab 06/08/17 1412  AST 20  ALT 11*  ALKPHOS 109  BILITOT 0.7  PROT 6.6  ALBUMIN 3.8   No results for input(s): LIPASE, AMYLASE in the last 168 hours. No results for input(s): AMMONIA in the last 168 hours. CBC: Recent Labs  Lab 06/08/17 1412  WBC 7.7  NEUTROABS 5.6  HGB 15.8*  HCT 49.8*  MCV 96.1  PLT 185   Cardiac Enzymes: Recent Labs  Lab 06/08/17 1412  TROPONINI <0.03   CBG (last 3)  No results for input(s): GLUCAP in the last 72 hours. No results found for this or any previous visit (from the past 240 hour(s)).   Studies: Dg Chest 1 View  Result Date: 06/08/2017 CLINICAL DATA:  Right-sided weakness. EXAM: CHEST 1 VIEW COMPARISON:  06/14/2010.  03/11/2004.  CT 10/16/2001. FINDINGS: Mediastinum and hilar structures normal. Cardiomegaly with normal pulmonary vascularity. Density is noted right upper lobe. Although this could represent focal mild infiltrate, focal mass lesion cannot be excluded. Follow-up PA and lateral chest x-ray suggested. This density does not clear nonenhanced chest CT suggested. Mild cardiomegaly.  No pulmonary venous congestion. Small bilateral pleural effusions. No pneumothorax. Degenerative changes scoliosis thoracic spine . IMPRESSION: 1. Small ill-defined density is in the right upper lobe. Although this could represent a focal infiltrate, focal mass lesion cannot be excluded . Close follow-up PA lateral chest x-ray suggested. This density does not clear nonenhanced chest CT suggested . 2. Small bilateral pleural effusions. 3. Mild cardiomegaly.  No  pulmonary venous congestion. Electronically Signed   By: Marcello Moores  Register   On: 06/08/2017 14:58   Ct Head Wo Contrast  Result Date: 06/08/2017 CLINICAL DATA:  Focal neural deficit. EXAM: CT HEAD WITHOUT CONTRAST TECHNIQUE: Contiguous axial images were obtained from the base of the skull through the vertex without intravenous contrast. COMPARISON:  CT scan of June 14, 2010. FINDINGS: Brain: Mild diffuse cortical atrophy is noted. Mild chronic ischemic white matter disease is noted. Old bilateral alignment lacunar infarctions are noted. No mass effect or midline shift is noted. Ventricular size is within normal limits. There is no evidence of mass lesion, hemorrhage or acute infarction. Vascular: No hyperdense vessel or unexpected calcification. Skull: Normal. Negative for fracture or focal lesion. Sinuses/Orbits: Mild right ethmoid sinusitis is noted. Other: None. IMPRESSION: Mild diffuse cortical atrophy. Mild chronic ischemic white matter disease. No acute intracranial abnormality seen. Electronically Signed   By: Marijo Conception, M.D.   On: 06/08/2017 15:06   Mr Brain Wo Contrast (neuro Protocol)  Result Date: 06/08/2017 CLINICAL DATA:  71 year old female with right-sided weakness since yesterday morning. Ophthalmoplegia. EXAM: MRI HEAD WITHOUT CONTRAST TECHNIQUE: Multiplanar, multiecho pulse sequences of the brain and surrounding structures were obtained without intravenous contrast. COMPARISON:  Head CT without contrast 1448 hours today. Brain MRI 06/15/2010. FINDINGS: Brain: Cerebral volume loss since 2011 appears generalized. Progressed chronic small vessel disease with increased size and number of chronic lacunar infarcts in the bilateral deep gray matter nuclei and pons. Chronically advanced bilateral cerebral white matter T2 and FLAIR hyperintensity is also more patchy and confluent. No definite chronic cerebral blood products or cortical encephalomalacia identified. There is a linear 14 mm  area of restricted diffusion in the central right cerebellum with faint FLAIR hyperintensity. No hemorrhage or mass effect. There is a second punctate area of restricted diffusion along the ventral, superior left aspect of the fourth ventricle (series 3, image 76 and series 5, image 52. Associated T2 and FLAIR hyperintensity with no hemorrhage or mass effect. There is also a punctate area of restricted diffusion in the central right thalamus (series 5, image 53). No hemorrhage or mass effect. This is in an area of patchy T2 and FLAIR hyperintensity. No other restricted diffusion. No midline shift, mass effect, evidence of mass lesion, ventriculomegaly, extra-axial collection or acute intracranial hemorrhage. Cervicomedullary junction and pituitary are within normal limits. Vascular: Major intracranial vascular flow voids are stable since 2011. Skull and upper cervical spine: Negative visualized cervical spine. Normal bone marrow signal. Sinuses/Orbits: Leftward gaze deviation. Otherwise normal orbits soft tissues. Chronic mild mastoid effusions and paranasal sinus mucosal thickening is stable since 20/11. Other: Scalp and orbits soft tissues appear negative. IMPRESSION: 1. Acute lacunar infarct in the left dorsal pons, likely affecting the left medial longitudinal fasciculus (MLF) in this clinical setting. 2. Acute lacunar infarcts also in the right cerebellum and right thalamus. 3. No intracranial hemorrhage or mass effect. 4. 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 Electronically Signed   By: Genevie Ann M.D.   On: 06/08/2017 16:29   Scheduled Meds: .  aspirin  300 mg Rectal Daily   Or  . aspirin  325 mg Oral Daily  . enoxaparin (LOVENOX) injection  40 mg Subcutaneous Q24H  . levETIRAcetam  500 mg Oral BID   Continuous Infusions:  Principal Problem:   Acute lacunar stroke (HCC) Active Problems:   Hypertension   Anxiety   Seizures (HCC)   Diplopia  Time  spent:   Irwin Brakeman, MD, FAAFP [facility] Pager [phone]  If 7PM-7AM, please contact night-coverage [email or web address] Password [email or web address] 06/09/2017, 2:42 PM    LOS: 1 day

## 2017-06-09 NOTE — ED Notes (Signed)
Hospitalist Provider at bedside for rounding.

## 2017-06-09 NOTE — Progress Notes (Signed)
New Admission Note:  Arrival Method: Arrived by [facility] from [place] Orientation: alert x 3; disoriented to time Telemetry:3w18 Assessment: Completed Skin: IV: R forearm/SL Pain:0/10 Safety Measures: Safety Fall Prevention Plan was given, discussed. Admission: Completed 3W18: Patient has been orientated to the room, unit and the staff. Family: Husband updated.  Orders have been reviewed and implemented. Will continue to monitor the patient. Call light has been placed within reach and bed alarm has been activated.   Arta Silence ,RN

## 2017-06-09 NOTE — Progress Notes (Signed)
*  PRELIMINARY RESULTS* Echocardiogram 2D Echocardiogram has been performed.  Leavy Cella 06/09/2017, 10:47 AM

## 2017-06-10 ENCOUNTER — Inpatient Hospital Stay (HOSPITAL_COMMUNITY): Payer: Medicare Other

## 2017-06-10 DIAGNOSIS — I639 Cerebral infarction, unspecified: Secondary | ICD-10-CM | POA: Diagnosis present

## 2017-06-10 MED ORDER — GADOBENATE DIMEGLUMINE 529 MG/ML IV SOLN
15.0000 mL | Freq: Once | INTRAVENOUS | Status: AC | PRN
  Administered 2017-06-10: 15 mL via INTRAVENOUS

## 2017-06-10 NOTE — Progress Notes (Signed)
PROGRESS NOTE    Shelley [name]  OVF:[id number] DOB: 06-22-1946 DOA: 06/08/2017 PCP: [name], MD   Brief Narrative: 71 y.o. female Caucasian with past medical history of anxiety, hypertension, seizures who is being transferred from a hospital for stroke workup.   Patient reports waking up on 12 7.18 morning  with diplopia. Her husband noted that her left eye was deviated upward and lateral. Patient also appeared to have some difficulty walking. Her symptoms did not improve today, therefore the patient presented to the [facility] for evaluation. MRI showed multiple infarcts and patient was transported to [facility] course further stroke workup.  Assessment & Plan:   Principal Problem:   Acute lacunar stroke [facility]) Active Problems:   Hypertension   Anxiety   Seizures (HCC)   Diplopia  -MULTIPLE ACUTE LACUNAR INFARCT-NORMAL MRA OF HEAD AND NECK.MRI -Acute lacunar infarct in the left dorsal pons, likely affecting the left medial longitudinal fasciculus (MLF) in this clinical setting. 2. Acute lacunar infarcts also in the right cerebellum and right thalamus. 3. No intracranial hemorrhage or mass effect. 4. 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 -DIPLOPIA -HTN -GAD -EPILEPSY   PLAN-WORK UP PENDING.NEURO FOLLO.RECHECK ELECTROLYTES.continue keppra. DVT prophylaxis:LOVENOX Code Status:FULL Family Communication: NONE Disposition Plan:  TBD Consultants: NEURO Procedures:NONE Antimicrobials NONE Subjective:FEELS BETTER .does not see double but feels vision hazy.   Objective: Vitals:   06/09/17 2056 06/10/17 0120 06/10/17 0510 06/10/17 0713  BP: (!) 156/90 (!) 169/100 (!) 169/99 (!) 158/102  Pulse: 79 89 82 87  Resp: 16 18 16 18   Temp:  98.5 F (36.9 C) 98.5 F (36.9 C) 98.3 F (36.8 C)  TempSrc: Oral Oral Oral Oral  SpO2: 97% 96% 96% 96%  Weight:      Height:        Intake/Output Summary (Last  24 hours) at 06/10/2017 1105 Last data filed at 06/09/2017 1447 Gross per 24 hour  Intake -  Output 350 ml  Net -350 ml   Filed Weights   06/09/17 2021  Weight: 74 kg (163 lb 2.2 oz)    Examination:  General exam: Appears calm and comfortable  Respiratory system: Clear to auscultation. Respiratory effort normal. Cardiovascular system: S1 & S2 heard, RRR. No JVD, murmurs, rubs, gallops or clicks. No pedal edema. Gastrointestinal system: Abdomen is nondistended, soft and nontender. No organomegaly or masses felt. Normal bowel sounds heard. Central nervous system: Alert and oriented Extremities: Symmetric 5 x 5 power. Skin: No rashes, lesions or ulcer     Data Reviewed: I have personally reviewed following labs and imaging studies  CBC: Recent Labs  Lab 06/08/17 1412  WBC 7.7  NEUTROABS 5.6  HGB 15.8*  HCT 49.8*  MCV 96.1  PLT 841   Basic Metabolic Panel: Recent Labs  Lab 06/08/17 1412  NA 136  K 3.3*  CL 102  CO2 24  GLUCOSE 97  BUN 13  CREATININE 0.66  CALCIUM 9.0   GFR: Estimated Creatinine Clearance: 64 mL/min (by C-G formula based on SCr of 0.66 mg/dL). Liver Function Tests: Recent Labs  Lab 06/08/17 1412  AST 20  ALT 11*  ALKPHOS 109  BILITOT 0.7  PROT 6.6  ALBUMIN 3.8   No results for input(s): LIPASE, AMYLASE in the last 168 hours. No results for input(s): AMMONIA in the last 168 hours. Coagulation Profile: Recent Labs  Lab 06/08/17 1412  INR 1.02   Cardiac Enzymes: Recent Labs  Lab 06/08/17 1412  TROPONINI <  0.03   BNP (last 3 results) No results for input(s): PROBNP in the last 8760 hours. HbA1C: Recent Labs    06/09/17 0513  HGBA1C 5.6   CBG: No results for input(s): GLUCAP in the last 168 hours. Lipid Profile: Recent Labs    06/09/17 0513  CHOL 177  HDL 61  LDLCALC 101*  TRIG 74  CHOLHDL 2.9   Thyroid Function Tests: No results for input(s): TSH, T4TOTAL, FREET4, T3FREE, THYROIDAB in the last 72 hours. Anemia  Panel: No results for input(s): VITAMINB12, FOLATE, FERRITIN, TIBC, IRON, RETICCTPCT in the last 72 hours. Sepsis Labs: No results for input(s): PROCALCITON, LATICACIDVEN in the last 168 hours.  No results found for this or any previous visit (from the past 240 hour(s)).       Radiology Studies: Dg Chest 1 View  Result Date: 06/08/2017 CLINICAL DATA:  Right-sided weakness. EXAM: CHEST 1 VIEW COMPARISON:  06/14/2010.  03/11/2004.  CT 10/16/2001. FINDINGS: Mediastinum and hilar structures normal. Cardiomegaly with normal pulmonary vascularity. Density is noted right upper lobe. Although this could represent focal mild infiltrate, focal mass lesion cannot be excluded. Follow-up PA and lateral chest x-ray suggested. This density does not clear nonenhanced chest CT suggested. Mild cardiomegaly. No pulmonary venous congestion. Small bilateral pleural effusions. No pneumothorax. Degenerative changes scoliosis thoracic spine . IMPRESSION: 1. Small ill-defined density is in the right upper lobe. Although this could represent a focal infiltrate, focal mass lesion cannot be excluded . Close follow-up PA lateral chest x-ray suggested. This density does not clear nonenhanced chest CT suggested . 2. Small bilateral pleural effusions. 3. Mild cardiomegaly.  No pulmonary venous congestion. Electronically Signed   By: Marcello Moores  Register   On: 06/08/2017 14:58   Ct Head Wo Contrast  Result Date: 06/08/2017 CLINICAL DATA:  Focal neural deficit. EXAM: CT HEAD WITHOUT CONTRAST TECHNIQUE: Contiguous axial images were obtained from the base of the skull through the vertex without intravenous contrast. COMPARISON:  CT scan of June 14, 2010. FINDINGS: Brain: Mild diffuse cortical atrophy is noted. Mild chronic ischemic white matter disease is noted. Old bilateral alignment lacunar infarctions are noted. No mass effect or midline shift is noted. Ventricular size is within normal limits. There is no evidence of mass  lesion, hemorrhage or acute infarction. Vascular: No hyperdense vessel or unexpected calcification. Skull: Normal. Negative for fracture or focal lesion. Sinuses/Orbits: Mild right ethmoid sinusitis is noted. Other: None. IMPRESSION: Mild diffuse cortical atrophy. Mild chronic ischemic white matter disease. No acute intracranial abnormality seen. Electronically Signed   By: Marijo Conception, M.D.   On: 06/08/2017 15:06   Mr Jodene Nam Neck W Wo Contrast  Result Date: 06/10/2017 CLINICAL DATA:  71 y/o  F; stroke follow-up evaluation. EXAM: MRA NECK WITHOUT AND WITH CONTRAST MRA NECK WITHOUT AND WITH CONTRAST TECHNIQUE: Multiplanar and multiecho pulse sequences of the neck were obtained without and with intravenous contrast. Angiographic images of the neck were obtained using MRA technique without and with intravenous contrast. Angiographic images of the head were obtained using MRA technique without intravenous contrast. CONTRAST:  80mL MULTIHANCE GADOBENATE DIMEGLUMINE 529 MG/ML IV SOLN COMPARISON:  06/08/2017 MRI of the head FINDINGS: MRA neck: Aortic arch: Patent. Right common carotid artery: Patent. Right internal carotid artery: Patent. Right vertebral artery: Patent. Left common carotid artery: Patent. Left Internal carotid artery: Patent. Left Vertebral artery: Patent. There is no evidence of hemodynamically significant stenosis by NASCET criteria, occlusion, or aneurysm. MRA head: Internal carotid arteries:  Patent. Anterior cerebral arteries:  Patent. Middle cerebral arteries: Patent. Anterior communicating artery: Patent. Posterior communicating arteries: Fetal left PCA. No right posterior communicating artery identified, likely hypoplastic or absent. Posterior cerebral arteries:  Patent. Basilar artery:  Patent. Vertebral arteries:  Patent. No evidence of high-grade stenosis, large vessel occlusion, or aneurysm. IMPRESSION: 1. Normal MRA of the neck. 2. Normal MRA of the head. Electronically Signed   By: Kristine Garbe M.D.   On: 06/10/2017 01:39   Mr Brain Wo Contrast (neuro Protocol)  Result Date: 06/08/2017 CLINICAL DATA:  71 year old female with right-sided weakness since yesterday morning. Ophthalmoplegia. EXAM: MRI HEAD WITHOUT CONTRAST TECHNIQUE: Multiplanar, multiecho pulse sequences of the brain and surrounding structures were obtained without intravenous contrast. COMPARISON:  Head CT without contrast 1448 hours today. Brain MRI 06/15/2010. FINDINGS: Brain: Cerebral volume loss since 2011 appears generalized. Progressed chronic small vessel disease with increased size and number of chronic lacunar infarcts in the bilateral deep gray matter nuclei and pons. Chronically advanced bilateral cerebral white matter T2 and FLAIR hyperintensity is also more patchy and confluent. No definite chronic cerebral blood products or cortical encephalomalacia identified. There is a linear 14 mm area of restricted diffusion in the central right cerebellum with faint FLAIR hyperintensity. No hemorrhage or mass effect. There is a second punctate area of restricted diffusion along the ventral, superior left aspect of the fourth ventricle (series 3, image 76 and series 5, image 52. Associated T2 and FLAIR hyperintensity with no hemorrhage or mass effect. There is also a punctate area of restricted diffusion in the central right thalamus (series 5, image 53). No hemorrhage or mass effect. This is in an area of patchy T2 and FLAIR hyperintensity. No other restricted diffusion. No midline shift, mass effect, evidence of mass lesion, ventriculomegaly, extra-axial collection or acute intracranial hemorrhage. Cervicomedullary junction and pituitary are within normal limits. Vascular: Major intracranial vascular flow voids are stable since 2011. Skull and upper cervical spine: Negative visualized cervical spine. Normal bone marrow signal. Sinuses/Orbits: Leftward gaze deviation. Otherwise normal orbits soft tissues. Chronic  mild mastoid effusions and paranasal sinus mucosal thickening is stable since 20/11. Other: Scalp and orbits soft tissues appear negative. IMPRESSION: 1. Acute lacunar infarct in the left dorsal pons, likely affecting the left medial longitudinal fasciculus (MLF) in this clinical setting. 2. Acute lacunar infarcts also in the right cerebellum and right thalamus. 3. No intracranial hemorrhage or mass effect. 4. 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 Electronically Signed   By: Genevie Ann M.D.   On: 06/08/2017 16:29   Mr Jodene Nam Head/brain WU Cm  Result Date: 06/10/2017 CLINICAL DATA:  71 y/o  F; stroke follow-up evaluation. EXAM: MRA NECK WITHOUT AND WITH CONTRAST MRA NECK WITHOUT AND WITH CONTRAST TECHNIQUE: Multiplanar and multiecho pulse sequences of the neck were obtained without and with intravenous contrast. Angiographic images of the neck were obtained using MRA technique without and with intravenous contrast. Angiographic images of the head were obtained using MRA technique without intravenous contrast. CONTRAST:  38mL MULTIHANCE GADOBENATE DIMEGLUMINE 529 MG/ML IV SOLN COMPARISON:  06/08/2017 MRI of the head FINDINGS: MRA neck: Aortic arch: Patent. Right common carotid artery: Patent. Right internal carotid artery: Patent. Right vertebral artery: Patent. Left common carotid artery: Patent. Left Internal carotid artery: Patent. Left Vertebral artery: Patent. There is no evidence of hemodynamically significant stenosis by NASCET criteria, occlusion, or aneurysm. MRA head: Internal carotid arteries:  Patent. Anterior cerebral arteries:  Patent. Middle cerebral arteries: Patent. Anterior communicating artery: Patent.  Posterior communicating arteries: Fetal left PCA. No right posterior communicating artery identified, likely hypoplastic or absent. Posterior cerebral arteries:  Patent. Basilar artery:  Patent. Vertebral arteries:  Patent. No evidence of high-grade  stenosis, large vessel occlusion, or aneurysm. IMPRESSION: 1. Normal MRA of the neck. 2. Normal MRA of the head. Electronically Signed   By: Kristine Garbe M.D.   On: 06/10/2017 01:39        Scheduled Meds: . aspirin  300 mg Rectal Daily   Or  . aspirin  325 mg Oral Daily  . enoxaparin (LOVENOX) injection  40 mg Subcutaneous Q24H  . levETIRAcetam  500 mg Oral BID   Continuous Infusions:   LOS: 2 days    Georgette Shell, MD [facility]  If 7PM-7AM, please contact night-coverage [email or web address] Password [email or web address] 06/10/2017, 11:05 AM

## 2017-06-10 NOTE — Evaluation (Signed)
Occupational Therapy Evaluation Patient Details Name: Shelley [name] MRN: [id number] DOB: February 20, 1946 Today's Date: 06/10/2017    History of Present Illness Patient reports waking up on 12 7.18 morning  with diplopia.  Her husband noted that her left eye was deviated upward and lateral.  Patient also appeared to have some difficulty walking.  Her symptoms did not improve today, therefore the patient presented to the [facility] for evaluation. MRI showed acute infarcts Lt dorsal pons, Rt cerebellar and thalamus   Clinical Impression   PTA, pt was living with her husband and was independnent. Pt currently requiring Min A for UB ADLs, Mod A for LB ADLs, and Mod A for toileting. Pt presenting with decreased eye alignment, vision, cognition, and balance. Pt would benefit from further acute OT to facilitate safe dc. Recommend dc to CIR for intensive OT to optimize safety and independence with ADLs and functional mobility before returning home.     Follow Up Recommendations  CIR    Equipment Recommendations  (Defer to next venue)    Recommendations for Other Services PT consult;Rehab consult;Speech consult     Precautions / Restrictions Precautions Precautions: Fall Restrictions Weight Bearing Restrictions: No      Mobility Bed Mobility Overal bed mobility: Needs Assistance Bed Mobility: Rolling;Sidelying to Sit;Sit to Supine Rolling: Min guard(with rail) Sidelying to sit: Min guard(with rail)   Sit to supine: Mod assist   General bed mobility comments: on return to bed, pt fatigued and required increased assist to raise legs and lower torso avoiding her bed rail  Transfers Overall transfer level: Needs assistance Equipment used: Rolling walker (2 wheeled) Transfers: Sit to/from Stand Sit to Stand: Min assist         General transfer comment: Min A to maintaining standing. no phsycial A to power up. Poor hand palcement and requires VCs for safety and upright  posture    Balance Overall balance assessment: Needs assistance Sitting-balance support: Feet supported Sitting balance-Leahy Scale: Fair Sitting balance - Comments: slight posterior bias, self-corrects Postural control: Posterior lean Standing balance support: Bilateral upper extremity supported;No upper extremity supported Standing balance-Leahy Scale: Poor Standing balance comment: unable to look straight ahead due to diplopia and looks down                            ADL either performed or assessed with clinical judgement   ADL Overall ADL's : Needs assistance/impaired Eating/Feeding: Set up;Sitting   Grooming: Set up;Sitting   Upper Body Bathing: Minimal assistance;Sitting   Lower Body Bathing: Moderate assistance;Sit to/from stand   Upper Body Dressing : Minimal assistance;Sitting   Lower Body Dressing: Moderate assistance;Sit to/from stand       Toileting- Water quality scientist and Hygiene: Moderate assistance;Sit to/from stand Toileting - Clothing Manipulation Details (indicate cue type and reason): Poor awarness to compeltely clean herself     Functional mobility during ADLs: Minimal assistance;Rolling walker;Cueing for sequencing;Cueing for safety(sit<>Stand) General ADL Comments: Pt performing toielt hygiene after BM in bed. Pt unaware of BM. Pt requiring Min A to maintain standing balance and Mod A for cleaning up. Pt abel to perform peri care but demosntrates decreased awaresness by wiping stool to peri area.      Vision Baseline Vision/History: Wears glasses Wears Glasses: At all times Patient Visual Report: Diplopia Vision Assessment?: Yes Eye Alignment: Impaired (comment) Tracking/Visual Pursuits: Decreased smoothness of horizontal tracking;Decreased smoothness of vertical tracking;Left eye does not track laterally;Impaired - to  be further tested in functional context Additional Comments: Needs further assessment. Pt with poor eye alignment and  tracking. Ptreports double vision.     Perception     Praxis      Pertinent Vitals/Pain Pain Assessment: No/denies pain     Hand Dominance Right   Extremity/Trunk Assessment Upper Extremity Assessment Upper Extremity Assessment: Generalized weakness   Lower Extremity Assessment Lower Extremity Assessment: Defer to PT evaluation LLE Coordination: decreased gross motor   Cervical / Trunk Assessment Cervical / Trunk Assessment: Kyphotic   Communication Communication Communication: No difficulties   Cognition Arousal/Alertness: Awake/alert Behavior During Therapy: WFL for tasks assessed/performed Overall Cognitive Status: Impaired/Different from baseline Area of Impairment: Attention;Memory;Following commands;Awareness;Problem solving;Safety/judgement                   Current Attention Level: Sustained Memory: Decreased short-term memory Following Commands: Follows one step commands with increased time;Follows multi-step commands inconsistently Safety/Judgement: Decreased awareness of safety Awareness: Emergent Problem Solving: Slow processing;Requires verbal cues General Comments: Pt with decreased attention, memory, and problem solving.    General Comments       Exercises     Shoulder Instructions      Home Living Family/patient expects to be discharged to:: Private residence Living Arrangements: Spouse/significant other Available Help at Discharge: Family;Available 24 hours/day Type of Home: House                              Lives With: Spouse    Prior Functioning/Environment Level of Independence: Independent        Comments: Reports that she performs ADLs and IADlLs        OT Problem List: Decreased strength;Decreased range of motion;Decreased activity tolerance;Impaired balance (sitting and/or standing);Decreased safety awareness;Decreased knowledge of use of DME or AE;Decreased cognition;Decreased coordination;Impaired  vision/perception;Decreased knowledge of precautions      OT Treatment/Interventions: Self-care/ADL training;Therapeutic exercise;Energy conservation;DME and/or AE instruction;Therapeutic activities;Patient/family education    OT Goals(Current goals can be found in the care plan section) Acute Rehab OT Goals Patient Stated Goal: be able to do like I could before OT Goal Formulation: With patient Time For Goal Achievement: 06/24/17 Potential to Achieve Goals: Good ADL Goals Pt Will Perform Grooming: with supervision;with set-up;standing Pt Will Perform Upper Body Dressing: with set-up;with supervision;sitting Pt Will Perform Lower Body Dressing: with set-up;with supervision;sit to/from stand Pt Will Transfer to Toilet: with set-up;with supervision;ambulating;regular height toilet Pt Will Perform Toileting - Clothing Manipulation and hygiene: with set-up;with supervision;sit to/from stand  OT Frequency: Min 2X/week   Barriers to D/C:            Co-evaluation              AM-PAC PT "6 Clicks" Daily Activity     Outcome Measure Help from another person eating meals?: None Help from another person taking care of personal grooming?: A Little Help from another person toileting, which includes using toliet, bedpan, or urinal?: A Lot Help from another person bathing (including washing, rinsing, drying)?: A Lot Help from another person to put on and taking off regular upper body clothing?: A Little Help from another person to put on and taking off regular lower body clothing?: A Lot 6 Click Score: 16   End of Session Equipment Utilized During Treatment: Surveyor, mining Communication: Mobility status  Activity Tolerance: Patient tolerated treatment well Patient left: in bed;with call bell/phone within reach;with nursing/sitter in room  OT Visit Diagnosis: Unsteadiness  on feet (R26.81);Other abnormalities of gait and mobility (R26.89);Muscle weakness (generalized) (M62.81);Low  vision, both eyes (H54.2);Other symptoms and signs involving cognitive function                Time: 6073-7106 OT Time Calculation (min): 29 min Charges:  OT General Charges $OT Visit: 1 Visit OT Evaluation $OT Eval Moderate Complexity: 1 Mod OT Treatments $Self Care/Home Management : 8-22 mins G-Codes:     Jhada Risk MSOT, OTR/L Acute Rehab Pager: [phone] Office: [place] 06/10/2017, 1:26 PM

## 2017-06-10 NOTE — Evaluation (Signed)
Physical Therapy Evaluation Patient Details Name: Shelley [name] MRN: [id number] DOB: [birth year] Today's Date: 06/10/2017   History of Present Illness  Patient reports waking up on 12 7.18 morning  with diplopia.  Her husband noted that her left eye was deviated upward and lateral.  Patient also appeared to have some difficulty walking.  Her symptoms did not improve today, therefore the patient presented to the [facility] for evaluation. MRI showed acute infarcts Lt dorsal pons, Rt cerebellar and thalamus  Clinical Impression   Pt admitted with above diagnosis. Pt currently with functional limitations due to the deficits listed below (see PT Problem List). Patient only able to tolerate walking 10 ft with RW and moderate assistance due to diplopia and decr coordination.  Pt will benefit from skilled PT to increase their independence and safety with mobility to allow discharge to the venue listed below.       Follow Up Recommendations CIR    Equipment Recommendations  Other (comment)(TBA if no CIR)    Recommendations for Other Services Rehab consult;OT consult     Precautions / Restrictions Precautions Precautions: Fall Restrictions Weight Bearing Restrictions: No      Mobility  Bed Mobility Overal bed mobility: Needs Assistance Bed Mobility: Rolling;Sidelying to Sit;Sit to Supine Rolling: Min guard(with rail) Sidelying to sit: Min assist(with rail)   Sit to supine: Mod assist   General bed mobility comments: on return to bed, pt fatigued and required increased assist to raise legs and lower torso avoiding her bed rail  Transfers Overall transfer level: Needs assistance Equipment used: Rolling walker (2 wheeled) Transfers: Sit to/from Stand Sit to Stand: Min assist         General transfer comment: moving very slowly; denies dizziness +HA  Ambulation/Gait Ambulation/Gait assistance: Mod assist Ambulation Distance (Feet): 10 Feet Assistive device: Rolling  walker (2 wheeled) Gait Pattern/deviations: Step-to pattern;Decreased weight shift to left;Ataxic;Drifts right/left;Wide base of support(difficulty placing LLE as she fatigued) Gait velocity: <1.8 ft/sec Gait velocity interpretation: <1.8 ft/sec, indicative of risk for recurrent falls    Stairs            Wheelchair Mobility    Modified Rankin (Stroke Patients Only) Modified Rankin (Stroke Patients Only) Pre-Morbid Rankin Score: No symptoms Modified Rankin: Moderately severe disability     Balance Overall balance assessment: Needs assistance Sitting-balance support: Feet supported Sitting balance-Leahy Scale: Fair Sitting balance - Comments: slight posterior bias, self-corrects Postural control: Posterior lean Standing balance support: Bilateral upper extremity supported Standing balance-Leahy Scale: Poor Standing balance comment: unable to look straight ahead due to diplopia and looks down                              Pertinent Vitals/Pain Pain Assessment: No/denies pain    Home Living Family/patient expects to be discharged to:: Private residence Living Arrangements: Spouse/significant other Available Help at Discharge: Family;Available 24 hours/day Type of Home: House                Prior Function Level of Independence: Independent         Comments: reports she fell at home due to CVA     Hand Dominance        Extremity/Trunk Assessment   Upper Extremity Assessment Upper Extremity Assessment: Defer to OT evaluation    Lower Extremity Assessment Lower Extremity Assessment: Generalized weakness;LLE deficits/detail LLE Coordination: decreased gross motor    Cervical / Trunk Assessment Cervical /  Trunk Assessment: Kyphotic  Communication   Communication: No difficulties  Cognition Arousal/Alertness: Awake/alert Behavior During Therapy: WFL for tasks assessed/performed Overall Cognitive Status: Within Functional Limits for tasks  assessed                                        General Comments      Exercises     Assessment/Plan    PT Assessment Patient needs continued PT services  PT Problem List Decreased strength;Decreased activity tolerance;Decreased balance;Decreased mobility;Decreased coordination;Decreased knowledge of use of DME       PT Treatment Interventions DME instruction;Gait training;Stair training;Functional mobility training;Therapeutic activities;Therapeutic exercise;Balance training;Neuromuscular re-education;Patient/family education    PT Goals (Current goals can be found in the Care Plan section)  Acute Rehab PT Goals Patient Stated Goal: be able to do like I could before PT Goal Formulation: With patient Time For Goal Achievement: 06/24/17 Potential to Achieve Goals: Good    Frequency Min 4X/week   Barriers to discharge        Co-evaluation               AM-PAC PT "6 Clicks" Daily Activity  Outcome Measure Difficulty turning over in bed (including adjusting bedclothes, sheets and blankets)?: A Little Difficulty moving from lying on back to sitting on the side of the bed? : A Little Difficulty sitting down on and standing up from a chair with arms (e.g., wheelchair, bedside commode, etc,.)?: A Lot Help needed moving to and from a bed to chair (including a wheelchair)?: A Lot Help needed walking in hospital room?: A Lot Help needed climbing 3-5 steps with a railing? : Total 6 Click Score: 13    End of Session Equipment Utilized During Treatment: Gait belt Activity Tolerance: Patient limited by fatigue Patient left: in bed;with bed alarm set;with call bell/phone within reach;with nursing/sitter in room(in chair position; no chair alarm pads on unit) Nurse Communication: Mobility status;Precautions PT Visit Diagnosis: Unsteadiness on feet (R26.81);Other abnormalities of gait and mobility (R26.89);Muscle weakness (generalized) (M62.81);History of falling  (Z91.81);Ataxic gait (R26.0)    Time: 2130-8657 PT Time Calculation (min) (ACUTE ONLY): 36 min   Charges:   PT Evaluation $PT Eval Moderate Complexity: 1 Mod PT Treatments $Gait Training: 8-22 mins   PT G Codes:          [employer], PT 06/10/2017, 9:45 AM

## 2017-06-10 NOTE — Evaluation (Signed)
Speech Language Pathology Evaluation Patient Details Name: Shelley [name] MRN: [id number] DOB: [birth year] Today's Date: 06/10/2017 Time: 9767-3419 SLP Time Calculation (min) (ACUTE ONLY): 19 min  Problem List:  Patient Active Problem List   Diagnosis Date Noted  . Acute lacunar stroke ([place]) 06/08/2017  . Diplopia 06/08/2017  . Hypertension   . Anxiety   . Seizures ([place])   . Encounter for screening colonoscopy 05/14/2012   Past Medical History:  Past Medical History:  Diagnosis Date  . Alcohol abuse    drinks awhile   . Anxiety   . CVA (cerebral infarction)    pt denies  . Hypertension   . Osteoporosis   . Seizures ([place])    Past Surgical History:  Past Surgical History:  Procedure Laterality Date  . ABDOMINAL HYSTERECTOMY    . BREAST LUMPECTOMY    . COLONOSCOPY WITH PROPOFOL  06/04/2012   Procedure: COLONOSCOPY WITH PROPOFOL;  Surgeon: Danie Binder, MD;  Location: [facility];  Service: Endoscopy;  Laterality: N/A;  entered cecum @ 0752; total cecal withdrawal time = 13 minutes  . EMPYEMA DRAINAGE    . FLEXIBLE SIGMOIDOSCOPY  03/14/99   internal hemorrhoids  . OOPHORECTOMY    . POLYPECTOMY  06/04/2012   Procedure: POLYPECTOMY;  Surgeon: Danie Binder, MD;  Location: [facility];  Service: Endoscopy;  Laterality: N/A;  descending colon polyps x2; rectal polyp x1   HPI:  Shelley Nuccio Wilsonis a 71 y.o.femalewith a history of hypertension, CVA, anxiety, seizure disorder. Admitted after waking up yesterday morning with diplopia, difficulty walking as her right legs appeared heavy. 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 Pt administered parts of the Cognistat and scored in the severe impairment range with  primary deficit in storage of information as she was unable to recall from field of 3 75% of the time. She required prompting for basic verbal problem solving and was not oriented to place (city). Pt would benefit from further ST to increase independence primarily with recall and problem solving.     SLP Assessment  SLP Recommendation/Assessment: Patient needs continued Speech Lanaguage Pathology Services    Follow Up Recommendations  Inpatient Rehab    Frequency and Duration min 2x/week  2 weeks      SLP Evaluation Cognition  Overall Cognitive Status: Impaired/Different from baseline Arousal/Alertness: Awake/alert Orientation Level: Oriented to person;Disoriented to place;Oriented to situation;Oriented to time Attention: Sustained Sustained Attention: Appears intact Memory: Impaired Memory Impairment: Storage deficit;Retrieval deficit(severe range on Cognistat) Awareness: Impaired Awareness Impairment: Intellectual impairment Problem Solving: Impaired Problem Solving Impairment: Verbal basic Safety/Judgment: Appears intact       Comprehension  Auditory Comprehension Overall Auditory Comprehension: Appears within functional limits for tasks assessed Visual Recognition/Discrimination Discrimination: Not tested Reading Comprehension Reading Status: Not tested    Expression Expression Primary Mode of Expression: Verbal Verbal Expression Overall Verbal Expression: Appears within functional limits for tasks assessed Initiation: No impairment Level of Generative/Spontaneous Verbalization: Conversation Repetition: No impairment Naming: No impairment Pragmatics: No impairment Written Expression Dominant Hand: Right Written Expression: Not tested   Oral / Motor  Motor Speech Overall Motor Speech: Appears within functional limits for tasks assessed Intelligibility: Intelligible Motor Planning: Witnin functional limits   GO  [place] Siren 06/10/2017, 12:31 PM  [name] Colvin Caroli.Ed [employer] [phone]

## 2017-06-10 NOTE — Progress Notes (Signed)
STROKE TEAM PROGRESS NOTE   HISTORY OF PRESENT ILLNESS (per record) Shelley [name] is an 71 y.o. female Caucasian with past medical history of anxiety, hypertension, seizures who is being transferred from a hospital for stroke workup.   Patient reports waking up on 12 7.18 morning  with diplopia. Her husband noted that her left eye was deviated upward and lateral. Patient also appeared to have some difficulty walking. Her symptoms did not improve today, therefore the patient presented to the [facility] for evaluation. MRI showed multiple infarcts and patient was transported to [facility] course further stroke workup.  Date last known well: 12.7.18 tPA Given: no, outside window  NIHSS: 0 Baseline MRS 0   SUBJECTIVE (INTERVAL HISTORY) Her family is not at bedside, she is alert and oriented and pleasant. She reports continued blurry vision.    OBJECTIVE Temp:  [98.1 F (36.7 C)-98.5 F (36.9 C)] 98.3 F (36.8 C) (12/09 0713) Pulse Rate:  [73-89] 87 (12/09 0713) Cardiac Rhythm: Normal sinus rhythm (12/09 0900) Resp:  [16-18] 18 (12/09 0713) BP: (148-169)/(87-102) 158/102 (12/09 0713) SpO2:  [96 %-97 %] 96 % (12/09 0713) Weight:  [163 lb 2.2 oz (74 kg)] 163 lb 2.2 oz (74 kg) (12/08 2021)  CBC:  Recent Labs  Lab 06/08/17 1412  WBC 7.7  NEUTROABS 5.6  HGB 15.8*  HCT 49.8*  MCV 96.1  PLT 546    Basic Metabolic Panel:  Recent Labs  Lab 06/08/17 1412  NA 136  K 3.3*  CL 102  CO2 24  GLUCOSE 97  BUN 13  CREATININE 0.66  CALCIUM 9.0    Lipid Panel:     Component Value Date/Time   CHOL 177 06/09/2017 0513   TRIG 74 06/09/2017 0513   HDL 61 06/09/2017 0513   CHOLHDL 2.9 06/09/2017 0513   VLDL 15 06/09/2017 0513   LDLCALC 101 (H) 06/09/2017 0513   HgbA1c:  Lab Results  Component Value Date   HGBA1C 5.6 06/09/2017   Urine Drug Screen:     Component Value Date/Time   LABOPIA NONE DETECTED 06/08/2017 2055   COCAINSCRNUR NONE DETECTED  06/08/2017 2055   LABBENZ NONE DETECTED 06/08/2017 2055   AMPHETMU NONE DETECTED 06/08/2017 2055   THCU NONE DETECTED 06/08/2017 2055   LABBARB NONE DETECTED 06/08/2017 2055    Alcohol Level     Component Value Date/Time   [facility] <10 06/08/2017 1412    IMAGING  Mr Jodene Nam Neck W Wo Contrast 06/10/2017 IMPRESSION:  1. Normal MRA of the neck.  2. Normal MRA of the head.     Mr Brain Wo Contrast (neuro Protocol) 06/08/2017  IMPRESSION:  1. Acute lacunar infarct in the left dorsal pons, likely affecting the left medial longitudinal fasciculus (MLF) in this clinical setting.  2. Acute lacunar infarcts also in the right cerebellum and right thalamus.  3. No intracranial hemorrhage or mass effect.  4. 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    Transthoracic Echocardiogram  06/09/2017 Study Conclusions - Left ventricle: The cavity size was normal. Wall thickness was   increased in a pattern of mild LVH. Systolic function was normal.   The estimated ejection fraction was in the range of 55% to 60%.   Features are consistent with a pseudonormal left ventricular   filling pattern, with concomitant abnormal relaxation and   increased filling pressure (grade 2 diastolic dysfunction). - Right atrium: The atrium was mildly dilated. - Atrial septum: No defect or  patent foramen ovale was identified.   PHYSICAL EXAM Vitals:   06/09/17 2056 06/10/17 0120 06/10/17 0510 06/10/17 0713  BP: (!) 156/90 (!) 169/100 (!) 169/99 (!) 158/102  Pulse: 79 89 82 87  Resp: 16 18 16 18   Temp:  98.5 F (36.9 C) 98.5 F (36.9 C) 98.3 F (36.8 C)  TempSrc: Oral Oral Oral Oral  SpO2: 97% 96% 96% 96%  Weight:      Height:        PHYSICAL EXAM Physical exam: Exam: Gen: NAD Eyes: anicteric sclerae, moist conjunctivae                    CV: no MRG, no carotid bruits, no peripheral edema Mental Status: Alert, follows commands, good  historian  Neuro: Detailed Neurologic Exam  Speech:    No aphasia, no dysarthria  Cranial Nerves:    The pupils are equal, round, and reactive to light.. Attempted, Fundi not visualized.  Dysconjugate gaze, left CNIV palsy. . No gaze preference. Visual fields full. Face symmetric, Tongue midline. Hearing intact to voice. Shoulder shrug intact  Motor Observation:    no involuntary movements noted. Tone appears normal.     Strength:    5/5     Sensation:  Intact to LT  Plantars downgoing.   ASSESSMENT/PLAN [name] is a 71 y.o. female with history of anxiety, ETOH hx,  hypertension, and seizures  presenting with diplopia and gait difficulties. She did not receive IV t-PA due to late presentation.  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  MRI head - acute lacunar infarcts in the left dorsal pons, right cerebellum, and right thalamus,  MRA head - normal  MRA neck -normal  Carotid Doppler -  MRA neck  2D Echo - EF 55-60%.  No cardiac source of emboli identified. Will need TEE and possible loop.  LDL -101  HgbA1c -  5.6   VTE prophylaxis - Lovenox  Diet Heart Room service appropriate? Yes; Fluid consistency: Thin Diet NPO time specified Except for: Sips with Meds  aspirin 81 mg daily prior to admission, now on aspirin 325 mg daily. Change to Plavix at discharge.  Patient counseled to be compliant with her antithrombotic medications  Ongoing aggressive stroke risk factor management  Therapy recommendations:  CIR recommended  Disposition:  Pending  Hypertension  Stable  Permissive hypertension (OK if < 220/120) but gradually normalize in 5-7 days  Long-term BP goal normotensive  Hyperlipidemia  Home meds:  no lipid-lowering medications prior to admission  LDL 101, goal < 70  And Lipitor 40 mg daily  Continue statin at  discharge    Other Stroke Risk Factors  Advanced age  Cigarette smoker - advised to stop smoking  Overweight, Body mass index is 27.15 kg/m., recommend weight loss, diet and exercise as appropriate    Other Active Problems  Mild hypokalemia - 3.3 - recheck in a.m.  Seizure history  Keppra 500 mg twice daily  EtOH history   Plan / Recommendations  TEE / loop Monday - NPO order written -message sent to cardiology.  Hospital day # 2  Personally examined patient and images, and have participated in and made any corrections needed to history, physical, neuro exam,assessment and plan as stated above.  I have personally obtained the history, evaluated lab date, reviewed imaging studies and agree with radiology interpretations.  Sarina Ill, MD Stroke Neurology  To contact Stroke Continuity provider, please refer to [email or web address] After hours, contact General Neurology

## 2017-06-10 NOTE — Progress Notes (Signed)
Inpatient Rehabilitation  Per PT, OT, and SLP request, patient was screened by Gunnar Fusi for appropriateness for an Inpatient Acute Rehab consult.  At this time we are recommending an Inpatient Rehab consult.  Text paged MD to notify; please order if you are agreeable.    Carmelia Roller., CCC/SLP Admission Coordinator  [facility]  Cell [phone]

## 2017-06-10 NOTE — Plan of Care (Signed)
  Progressing Education: Knowledge of General Education information will improve 06/10/2017 1055 - Progressing by Harlin Heys, [place] Behavior/Discharge Planning: Ability to manage health-related needs will improve 06/10/2017 1055 - Progressing by Harlin Heys, RN Clinical Measurements: Ability to maintain clinical measurements within normal limits will improve 06/10/2017 1055 - Progressing by Harlin Heys, RN Will remain free from infection 06/10/2017 1055 - Progressing by Harlin Heys, RN Diagnostic test results will improve 06/10/2017 1055 - Progressing by Harlin Heys, RN Respiratory complications will improve 06/10/2017 1055 - Progressing by Harlin Heys, RN Cardiovascular complication will be avoided 06/10/2017 1055 - Progressing by Harlin Heys, RN Activity: Risk for activity intolerance will decrease 06/10/2017 1055 - Progressing by Harlin Heys, RN Nutrition: Adequate nutrition will be maintained 06/10/2017 1055 - Progressing by Harlin Heys, RN Coping: Level of anxiety will decrease 06/10/2017 1055 - Progressing by Harlin Heys, RN Elimination: Will not experience complications related to bowel motility 06/10/2017 1055 - Progressing by Harlin Heys, RN Will not experience complications related to urinary retention 06/10/2017 1055 - Progressing by Harlin Heys, RN Pain Managment: General experience of comfort will improve 06/10/2017 1055 - Progressing by Harlin Heys, RN Safety: Ability to remain free from injury will improve 06/10/2017 1055 - Progressing by Harlin Heys, RN Skin Integrity: Risk for impaired skin integrity will decrease 06/10/2017 1055 - Progressing by Harlin Heys, RN Education: Knowledge of disease or condition will improve 06/10/2017 1055 - Progressing by Harlin Heys, RN Knowledge of secondary prevention will improve 06/10/2017 1055 - Progressing by Harlin Heys, RN Knowledge of patient specific risk  factors addressed and post discharge goals established will improve 06/10/2017 1055 - Progressing by Harlin Heys, [place] Behavior/Discharge Planning: Ability to manage health-related needs will improve 06/10/2017 1055 - Progressing by Harlin Heys, RN Ischemic Stroke/TIA Tissue Perfusion: Complications of ischemic stroke/TIA will be minimized 06/10/2017 1055 - Progressing by Harlin Heys, RN

## 2017-06-11 DIAGNOSIS — I639 Cerebral infarction, unspecified: Secondary | ICD-10-CM

## 2017-06-11 DIAGNOSIS — H532 Diplopia: Secondary | ICD-10-CM

## 2017-06-11 DIAGNOSIS — R569 Unspecified convulsions: Secondary | ICD-10-CM

## 2017-06-11 DIAGNOSIS — E876 Hypokalemia: Secondary | ICD-10-CM

## 2017-06-11 DIAGNOSIS — I5189 Other ill-defined heart diseases: Secondary | ICD-10-CM

## 2017-06-11 DIAGNOSIS — I519 Heart disease, unspecified: Secondary | ICD-10-CM

## 2017-06-11 DIAGNOSIS — F419 Anxiety disorder, unspecified: Secondary | ICD-10-CM

## 2017-06-11 DIAGNOSIS — I10 Essential (primary) hypertension: Secondary | ICD-10-CM

## 2017-06-11 LAB — BASIC METABOLIC PANEL
Anion gap: 11 (ref 5–15)
BUN: 12 mg/dL (ref 6–20)
CALCIUM: 8.8 mg/dL — AB (ref 8.9–10.3)
CHLORIDE: 104 mmol/L (ref 101–111)
CO2: 23 mmol/L (ref 22–32)
CREATININE: 0.72 mg/dL (ref 0.44–1.00)
Glucose, Bld: 87 mg/dL (ref 65–99)
Potassium: 3.3 mmol/L — ABNORMAL LOW (ref 3.5–5.1)
SODIUM: 138 mmol/L (ref 135–145)

## 2017-06-11 MED ORDER — POTASSIUM CHLORIDE CRYS ER 20 MEQ PO TBCR
40.0000 meq | EXTENDED_RELEASE_TABLET | Freq: Two times a day (BID) | ORAL | Status: AC
  Administered 2017-06-11 (×2): 40 meq via ORAL
  Filled 2017-06-11 (×2): qty 2

## 2017-06-11 NOTE — Consult Note (Signed)
Physical Medicine and Rehabilitation Consult Reason for Consult: Decreased functional mobility Referring Physician: [facility]   HPI: Shelley [name] is a 71 y.o. right handed female with history of hypertension, seizure disorder maintained on Keppra. Per chart review and patient, patient lives with spouse. Independent prior to admission. Presented 06/08/2017 with diplopia as well as fall. CT of the head reviewed, unremarkable for acute process. Patient did not receive TPA. MRI showed acute lacunar infarct in the left dorsal pons. Acute lacunar infarcts also the right cerebellum and right thalamus. MRA of head and neck unremarkable. Echocardiogram with ejection fraction of 39% grade 2 diastolic dysfunction. Maintain on aspirin for seizure prophylaxis. Subcutaneous Lovenox for DVT prophylaxis. Await plan for TEE and loop recorder placement. Physical therapy evaluation completed with recommendations of physical medicine rehabilitation consult.  Review of Systems  Constitutional: Negative for chills and fever.  HENT: Negative for hearing loss.   Eyes: Positive for double vision. Negative for blurred vision.  Respiratory: Negative for cough.   Cardiovascular: Positive for leg swelling. Negative for chest pain and palpitations.  Gastrointestinal: Positive for constipation. Negative for nausea and vomiting.  Genitourinary: Negative for dysuria and hematuria.  Skin: Negative for rash.  Neurological: Positive for seizures. Negative for speech change and focal weakness.  Psychiatric/Behavioral:       Anxiety  All other systems reviewed and are negative.  Past Medical History:  Diagnosis Date  . Alcohol abuse    drinks awhile   . Anxiety   . CVA (cerebral infarction)    pt denies  . Hypertension   . Osteoporosis   . Seizures ([place])    Past Surgical History:  Procedure Laterality Date  . ABDOMINAL HYSTERECTOMY    . BREAST LUMPECTOMY    . COLONOSCOPY WITH PROPOFOL  06/04/2012   Procedure:  COLONOSCOPY WITH PROPOFOL;  Surgeon: Danie Binder, MD;  Location: [facility];  Service: Endoscopy;  Laterality: N/A;  entered cecum @ 0752; total cecal withdrawal time = 13 minutes  . EMPYEMA DRAINAGE    . FLEXIBLE SIGMOIDOSCOPY  03/14/99   internal hemorrhoids  . OOPHORECTOMY    . POLYPECTOMY  06/04/2012   Procedure: POLYPECTOMY;  Surgeon: Danie Binder, MD;  Location: [facility];  Service: Endoscopy;  Laterality: N/A;  descending colon polyps x2; rectal polyp x1   Family History  Problem Relation Age of Onset  . Colon cancer Neg Hx    Social History:  reports that she has been smoking cigarettes.  She has been smoking about 0.50 packs per day. she has never used smokeless tobacco. She reports that she does not drink alcohol or use drugs. Allergies:  Allergies  Allergen Reactions  . Acyclovir And Related    Medications Prior to Admission  Medication Sig Dispense Refill  . aspirin 81 MG tablet Take 81 mg by mouth daily.    Marland Kitchen levETIRAcetam (KEPPRA) 500 MG tablet Take 500 mg by mouth 2 (two) times daily.    Marland Kitchen lisinopril (PRINIVIL,ZESTRIL) 10 MG tablet Take 10 mg by mouth daily.      Home: Home Living Family/patient expects to be discharged to:: Private residence Living Arrangements: Spouse/significant other Available Help at Discharge: Family, Available 24 hours/day Type of Home: House  Lives With: Spouse  Functional History: Prior Function Level of Independence: Independent Comments: Reports that she performs ADLs and IADlLs Functional Status:  Mobility: Bed Mobility Overal bed mobility: Needs Assistance Bed Mobility: Rolling, Sidelying to Sit, Sit to Supine Rolling: Min guard(with rail)  Sidelying to sit: Min guard(with rail) Sit to supine: Mod assist General bed mobility comments: on return to bed, pt fatigued and required increased assist to raise legs and lower torso avoiding her bed rail Transfers Overall transfer level: Needs assistance Equipment used: Rolling walker (2  wheeled) Transfers: Sit to/from Stand Sit to Stand: Min assist General transfer comment: Min A to maintaining standing. no phsycial A to power up. Poor hand palcement and requires VCs for safety and upright posture Ambulation/Gait Ambulation/Gait assistance: Mod assist Ambulation Distance (Feet): 10 Feet Assistive device: Rolling walker (2 wheeled) Gait Pattern/deviations: Step-to pattern, Decreased weight shift to left, Ataxic, Drifts right/left, Wide base of support(difficulty placing LLE as she fatigued) Gait velocity: <1.8 ft/sec Gait velocity interpretation: <1.8 ft/sec, indicative of risk for recurrent falls    ADL: ADL Overall ADL's : Needs assistance/impaired Eating/Feeding: Set up, Sitting Grooming: Set up, Sitting Upper Body Bathing: Minimal assistance, Sitting Lower Body Bathing: Moderate assistance, Sit to/from stand Upper Body Dressing : Minimal assistance, Sitting Lower Body Dressing: Moderate assistance, Sit to/from stand Toileting- Water quality scientist and Hygiene: Moderate assistance, Sit to/from stand Toileting - Clothing Manipulation Details (indicate cue type and reason): Poor awarness to compeltely clean herself Functional mobility during ADLs: Minimal assistance, Rolling walker, Cueing for sequencing, Cueing for safety(sit<>Stand) General ADL Comments: Pt performing toielt hygiene after BM in bed. Pt unaware of BM. Pt requiring Min A to maintain standing balance and Mod A for cleaning up. Pt abel to perform peri care but demosntrates decreased awaresness by wiping stool to peri area.   Cognition: Cognition Overall Cognitive Status: Impaired/Different from baseline Arousal/Alertness: Awake/alert Orientation Level: Oriented to person, Oriented to situation, Disoriented to situation, Disoriented to time Attention: Sustained Sustained Attention: Appears intact Memory: Impaired Memory Impairment: Storage deficit, Retrieval deficit(severe range on  Cognistat) Awareness: Impaired Awareness Impairment: Intellectual impairment Problem Solving: Impaired Problem Solving Impairment: Verbal basic Safety/Judgment: Appears intact Cognition Arousal/Alertness: Awake/alert Behavior During Therapy: WFL for tasks assessed/performed Overall Cognitive Status: Impaired/Different from baseline Area of Impairment: Attention, Memory, Following commands, Awareness, Problem solving, Safety/judgement Current Attention Level: Sustained Memory: Decreased short-term memory Following Commands: Follows one step commands with increased time, Follows multi-step commands inconsistently Safety/Judgement: Decreased awareness of safety Awareness: Emergent Problem Solving: Slow processing, Requires verbal cues General Comments: Pt with decreased attention, memory, and problem solving.   Blood pressure (!) 174/103, pulse 87, temperature 98.6 F (37 C), temperature source Oral, resp. rate 18, height 5\' 5"  (1.651 m), weight 74 kg (163 lb 2.2 oz), SpO2 97 %. Physical Exam  Vitals reviewed. Constitutional: She is oriented to person, place, and time. She appears well-developed.  Frail  HENT:  Head: Normocephalic and atraumatic.  Eyes: EOM are normal. Right eye exhibits no discharge. Left eye exhibits no discharge.  Neck: Normal range of motion. Neck supple. No thyromegaly present.  Cardiovascular: Normal rate, regular rhythm and normal heart sounds.  Respiratory: Effort normal and breath sounds normal. No respiratory distress.  GI: Soft. Bowel sounds are normal. She exhibits no distension.  Musculoskeletal:  No edema or tenderness in extremities  Neurological: She is alert and oriented to person, place, and time.  Follows commands. Fair awareness of deficits Motor: 4+/5 throughout No ataxia B/l UE  Skin: Skin is warm and dry.  Psychiatric: She has a normal mood and affect. Her behavior is normal.    Results for orders placed or performed during the hospital  encounter of 06/08/17 (from the past 24 hour(s))  Basic metabolic panel  Status: Abnormal   Collection Time: 06/11/17  4:16 AM  Result Value Ref Range   Sodium 138 135 - 145 mmol/L   Potassium 3.3 (L) 3.5 - 5.1 mmol/L   Chloride 104 101 - 111 mmol/L   CO2 23 22 - 32 mmol/L   Glucose, Bld 87 65 - 99 mg/dL   BUN 12 6 - 20 mg/dL   Creatinine, Ser 0.72 0.44 - 1.00 mg/dL   Calcium 8.8 (L) 8.9 - 10.3 mg/dL   GFR calc non Af Amer >60 >60 mL/min   GFR calc Af Amer >60 >60 mL/min   Anion gap 11 5 - 15   Mr Jodene Nam Neck W Wo Contrast  Result Date: 06/10/2017 CLINICAL DATA:  71 y/o  F; stroke follow-up evaluation. EXAM: MRA NECK WITHOUT AND WITH CONTRAST MRA NECK WITHOUT AND WITH CONTRAST TECHNIQUE: Multiplanar and multiecho pulse sequences of the neck were obtained without and with intravenous contrast. Angiographic images of the neck were obtained using MRA technique without and with intravenous contrast. Angiographic images of the head were obtained using MRA technique without intravenous contrast. CONTRAST:  11mL MULTIHANCE GADOBENATE DIMEGLUMINE 529 MG/ML IV SOLN COMPARISON:  06/08/2017 MRI of the head FINDINGS: MRA neck: Aortic arch: Patent. Right common carotid artery: Patent. Right internal carotid artery: Patent. Right vertebral artery: Patent. Left common carotid artery: Patent. Left Internal carotid artery: Patent. Left Vertebral artery: Patent. There is no evidence of hemodynamically significant stenosis by NASCET criteria, occlusion, or aneurysm. MRA head: Internal carotid arteries:  Patent. Anterior cerebral arteries:  Patent. Middle cerebral arteries: Patent. Anterior communicating artery: Patent. Posterior communicating arteries: Fetal left PCA. No right posterior communicating artery identified, likely hypoplastic or absent. Posterior cerebral arteries:  Patent. Basilar artery:  Patent. Vertebral arteries:  Patent. No evidence of high-grade stenosis, large vessel occlusion, or aneurysm.  IMPRESSION: 1. Normal MRA of the neck. 2. Normal MRA of the head. Electronically Signed   By: Kristine Garbe M.D.   On: 06/10/2017 01:39   Mr Jodene Nam Head/brain PI Cm  Result Date: 06/10/2017 CLINICAL DATA:  71 y/o  F; stroke follow-up evaluation. EXAM: MRA NECK WITHOUT AND WITH CONTRAST MRA NECK WITHOUT AND WITH CONTRAST TECHNIQUE: Multiplanar and multiecho pulse sequences of the neck were obtained without and with intravenous contrast. Angiographic images of the neck were obtained using MRA technique without and with intravenous contrast. Angiographic images of the head were obtained using MRA technique without intravenous contrast. CONTRAST:  31mL MULTIHANCE GADOBENATE DIMEGLUMINE 529 MG/ML IV SOLN COMPARISON:  06/08/2017 MRI of the head FINDINGS: MRA neck: Aortic arch: Patent. Right common carotid artery: Patent. Right internal carotid artery: Patent. Right vertebral artery: Patent. Left common carotid artery: Patent. Left Internal carotid artery: Patent. Left Vertebral artery: Patent. There is no evidence of hemodynamically significant stenosis by NASCET criteria, occlusion, or aneurysm. MRA head: Internal carotid arteries:  Patent. Anterior cerebral arteries:  Patent. Middle cerebral arteries: Patent. Anterior communicating artery: Patent. Posterior communicating arteries: Fetal left PCA. No right posterior communicating artery identified, likely hypoplastic or absent. Posterior cerebral arteries:  Patent. Basilar artery:  Patent. Vertebral arteries:  Patent. No evidence of high-grade stenosis, large vessel occlusion, or aneurysm. IMPRESSION: 1. Normal MRA of the neck. 2. Normal MRA of the head. Electronically Signed   By: Kristine Garbe M.D.   On: 06/10/2017 01:39    Assessment/Plan: Diagnosis: acute lacunar infarct in the left dorsal pons and right cerebellum and right thalamus Labs and images independently reviewed.  Records reviewed and summated above.  Stroke: Continue secondary  stroke prophylaxis and Risk Factor Modification listed below:   Antiplatelet therapy:   Blood Pressure Management:  Continue current medication with prn's with permisive HTN per primary team Statin Agent:   Tobacco abuse:    1. Does the need for close, 24 hr/day medical supervision in concert with the patient's rehab needs make it unreasonable for this patient to be served in a less intensive setting? Yes  2. Co-Morbidities requiring supervision/potential complications: HTN (monitor and provide prns in accordance with increased physical exertion and pain), seizure disorder (cont meds), diastolic dysfunction (monitor for signs/symptoms of fluid overload), hypokalemia (continue to monitor and replete as necessary) 3. Due to safety, disease management and patient education, does the patient require 24 hr/day rehab nursing? Yes 4. Does the patient require coordinated care of a physician, rehab nurse, PT (1-2 hrs/day, 5 days/week), OT (1-2 hrs/day, 5 days/week) and SLP (1-2 hrs/day, 5 days/week) to address physical and functional deficits in the context of the above medical diagnosis(es)? Yes Addressing deficits in the following areas: balance, endurance, locomotion, strength, transferring, bathing, dressing, toileting, swallowing and psychosocial support 5. Can the patient actively participate in an intensive therapy program of at least 3 hrs of therapy per day at least 5 days per week? Yes 6. The potential for patient to make measurable gains while on inpatient rehab is excellent 7. Anticipated functional outcomes upon discharge from inpatient rehab are supervision  with PT, supervision with OT, independent and modified independent with SLP. 8. Estimated rehab length of stay to reach the above functional goals is: 8-12 days. 9. Anticipated D/C setting: Home 10. Anticipated post D/C treatments: HH therapy and Home excercise program 11. Overall Rehab/Functional Prognosis: good  RECOMMENDATIONS: This  patient's condition is appropriate for continued rehabilitative care in the following setting: Patient does not believe she require CIR and would like to discuss it with her husband.  Anticipate patient will require CIR, however, after completion of medical workup. Patient has agreed to participate in recommended program. Potentially Note that insurance prior authorization may be required for reimbursement for recommended care.  Comment: Rehab Admissions Coordinator to follow up.  Delice Lesch, MD, ABPMR Lavon Paganini Angiulli, PA-C 06/11/2017

## 2017-06-11 NOTE — Progress Notes (Signed)
Physical Therapy Treatment Patient Details Name: Shelley [name] MRN: [id number] DOB: 07-10-1945 Today's Date: 06/11/2017    History of Present Illness Patient reports waking up on 12 7.18 morning  with diplopia.  Her husband noted that her left eye was deviated upward and lateral.  Patient also appeared to have some difficulty walking.  Her symptoms did not improve today, therefore the patient presented to the [facility] for evaluation. MRI showed acute infarcts Lt dorsal pons, Rt cerebellar and thalamus    PT Comments    Patient progressing slowly towards PT goals. Tolerated gait training with Mod A for balance/safety with pt reaching for furniture for support. Improved distance today with handheld assist. Continues to reports impaired vision and weakness. Very slow to perform all movements and requires increased cueing and time. Good CIR candidate. Will continue to follow.   Follow Up Recommendations  CIR     Equipment Recommendations  Other (comment)(TBA)    Recommendations for Other Services       Precautions / Restrictions Precautions Precautions: Fall Restrictions Weight Bearing Restrictions: No    Mobility  Bed Mobility Overal bed mobility: Needs Assistance Bed Mobility: Supine to Sit     Supine to sit: Min guard;HOB elevated     General bed mobility comments: Lots of time to get to EOB with step by step cues for technique.   Transfers Overall transfer level: Needs assistance Equipment used: 1 person hand held assist Transfers: Sit to/from Stand Sit to Stand: Min assist         General transfer comment: Min A to power to standing, very slow to rise with pt reaching for counter top for support; cues for upright posture as pt hunched over. Transferred to chair post ambulation.  Ambulation/Gait Ambulation/Gait assistance: Mod assist Ambulation Distance (Feet): 20 Feet Assistive device: 1 person hand held assist Gait Pattern/deviations: Step-to  pattern;Decreased weight shift to left;Ataxic;Wide base of support;Step-through pattern;Trunk flexed Gait velocity: decreased Gait velocity interpretation: <1.8 ft/sec, indicative of risk for recurrent falls General Gait Details: Very slow, unsteady gait with handheld assist and other UE reaching for furniture for support; decreased foot clearance bilaterally; bil knee instability noted.    Stairs            Wheelchair Mobility    Modified Rankin (Stroke Patients Only) Modified Rankin (Stroke Patients Only) Pre-Morbid Rankin Score: No symptoms Modified Rankin: Moderately severe disability     Balance Overall balance assessment: Needs assistance Sitting-balance support: Feet supported;No upper extremity supported Sitting balance-Leahy Scale: Fair Sitting balance - Comments: slight posterior bias, self-corrects Postural control: Posterior lean Standing balance support: During functional activity Standing balance-Leahy Scale: Poor Standing balance comment: requires Mod A for dynamic standing balance tasks                            Cognition Arousal/Alertness: Awake/alert Behavior During Therapy: WFL for tasks assessed/performed Overall Cognitive Status: Impaired/Different from baseline Area of Impairment: Memory;Following commands;Awareness;Problem solving;Safety/judgement                       Following Commands: Follows one step commands with increased time Safety/Judgement: Decreased awareness of safety Awareness: Emergent Problem Solving: Slow processing;Requires verbal cues General Comments: Knows she is in the hospital but not sure which one. Able to get to [facility] with cues. Increased time to perform all mobility with repetition of cues.       Exercises  General Comments General comments (skin integrity, edema, etc.): HR ranged from 80-105 bpm.      Pertinent Vitals/Pain Pain Assessment: No/denies pain    Home Living                       Prior Function            PT Goals (current goals can now be found in the care plan section) Progress towards PT goals: Progressing toward goals    Frequency    Min 4X/week      PT Plan Current plan remains appropriate    Co-evaluation              AM-PAC PT "6 Clicks" Daily Activity  Outcome Measure  Difficulty turning over in bed (including adjusting bedclothes, sheets and blankets)?: None Difficulty moving from lying on back to sitting on the side of the bed? : A Little Difficulty sitting down on and standing up from a chair with arms (e.g., wheelchair, bedside commode, etc,.)?: Unable Help needed moving to and from a bed to chair (including a wheelchair)?: A Lot Help needed walking in hospital room?: A Lot Help needed climbing 3-5 steps with a railing? : Total 6 Click Score: 13    End of Session Equipment Utilized During Treatment: Gait belt Activity Tolerance: Patient limited by fatigue Patient left: in chair;with call bell/phone within reach Nurse Communication: Mobility status PT Visit Diagnosis: Unsteadiness on feet (R26.81);Other abnormalities of gait and mobility (R26.89);Muscle weakness (generalized) (M62.81);History of falling (Z91.81);Ataxic gait (R26.0)     Time: 4268-3419 PT Time Calculation (min) (ACUTE ONLY): 27 min  Charges:  $Gait Training: 8-22 mins $Therapeutic Activity: 8-22 mins                    G Codes:       Shelley [name], PT, DPT [phone]     Shelley [name] 06/11/2017, 12:30 PM

## 2017-06-11 NOTE — Consult Note (Signed)
ELECTROPHYSIOLOGY CONSULT NOTE  Patient ID: Shelley [name] MRN: [id number], DOB/AGE: [birth year]   Admit date: 06/08/2017 Date of Consult: 06/11/2017  Primary Physician: [name], MD Primary Cardiologist: none Reason for Consultation: Cryptogenic stroke ; recommendations regarding Implantable Loop Recorder requested by dr. Jaynee Eagles  History of Present Illness Shelley [name] was admitted on 06/08/2017 with double vision, difficulty with ambulation.  They first developed symptoms while at home.  PMHx noted for HTN, seizure d/o, and anxiety.  Imaging demonstrated Bilateral infarcts in multiple areas - embolic - source unknown.  she has undergone workup for stroke including echocardiogram and carotid angio.  The patient has been monitored on telemetry which has demonstrated sinus rhythm with no arrhythmias.  Inpatient stroke work-up is to be completed with a TEE, not yet scheduled.   Echocardiogram this admission demonstrated.   06/09/2017 Study Conclusions - Left ventricle: The cavity size was normal. Wall thickness was increased in a pattern of mild LVH. Systolic function was normal. The estimated ejection fraction was in the range of 55% to 60%. Features are consistent with a pseudonormal left ventricular filling pattern, with concomitant abnormal relaxation and increased filling pressure (grade 2 diastolic dysfunction). - Right atrium: The atrium was mildly dilated. - Atrial septum: No defect or patent foramen ovale was identified.   Lab work is reviewed.  Prior to admission, the patient denies chest pain, shortness of breath, dizziness, palpitations, or syncope.  They are slowly recovering from their stroke with plans to CIR at discharge.    Past Medical History:  Diagnosis Date  . Alcohol abuse    drinks awhile   . Anxiety   . CVA (cerebral infarction)    pt denies  . Hypertension   . Osteoporosis   . Seizures [facility])      Surgical History:  Past Surgical  History:  Procedure Laterality Date  . ABDOMINAL HYSTERECTOMY    . BREAST LUMPECTOMY    . COLONOSCOPY WITH PROPOFOL  06/04/2012   Procedure: COLONOSCOPY WITH PROPOFOL;  Surgeon: Danie Binder, MD;  Location: [facility];  Service: Endoscopy;  Laterality: N/A;  entered cecum @ 0752; total cecal withdrawal time = 13 minutes  . EMPYEMA DRAINAGE    . FLEXIBLE SIGMOIDOSCOPY  03/14/99   internal hemorrhoids  . OOPHORECTOMY    . POLYPECTOMY  06/04/2012   Procedure: POLYPECTOMY;  Surgeon: Danie Binder, MD;  Location: [facility];  Service: Endoscopy;  Laterality: N/A;  descending colon polyps x2; rectal polyp x1     Medications Prior to Admission  Medication Sig Dispense Refill Last Dose  . aspirin 81 MG tablet Take 81 mg by mouth daily.     Marland Kitchen levETIRAcetam (KEPPRA) 500 MG tablet Take 500 mg by mouth 2 (two) times daily.   06/04/2012 at 0500  . lisinopril (PRINIVIL,ZESTRIL) 10 MG tablet Take 10 mg by mouth daily.   06/04/2012 at 0500    Inpatient Medications:  . aspirin  300 mg Rectal Daily   Or  . aspirin  325 mg Oral Daily  . enoxaparin (LOVENOX) injection  40 mg Subcutaneous Q24H  . levETIRAcetam  500 mg Oral BID    Allergies:  Allergies  Allergen Reactions  . Acyclovir And Related     Social History   Socioeconomic History  . Marital status: Married    Spouse name: Not on file  . Number of children: Not on file  . Years of education: Not on file  . Highest education level: Not on file  Social Needs  . Financial resource strain: Not on file  . Food insecurity - worry: Not on file  . Food insecurity - inability: Not on file  . Transportation needs - medical: Not on file  . Transportation needs - non-medical: Not on file  Occupational History  . Not on file  Tobacco Use  . Smoking status: Current Some Day Smoker    Packs/day: 0.50    Types: Cigarettes  . Smokeless tobacco: Never Used  Substance and Sexual Activity  . Alcohol use: No    Comment: hx of ETOH abuse  . Drug use: No   . Sexual activity: Not on file  Other Topics Concern  . Not on file  Social History Narrative  . Not on file     Family History  Problem Relation Age of Onset  . Colon cancer Neg Hx       Review of Systems: All other systems reviewed and are otherwise negative except as noted above.  Physical Exam: Vitals:   06/10/17 1653 06/10/17 2200 06/11/17 0226 06/11/17 0525  BP:  (!) 175/103 (!) 162/90 (!) 174/103  Pulse: 82 78 80 87  Resp: 17 18 18 18   Temp: 98.6 F (37 C) 99 F (37.2 C) 98.2 F (36.8 C) 98.6 F (37 C)  TempSrc: Oral Oral Oral Oral  SpO2: 99% 96% 97% 97%  Weight:      Height:        GEN- The patient is elderly, well appearing, alert and oriented x 3 today.   Head- normocephalic, atraumatic Eyes-  Sclera clear, conjunctiva pink Ears- hearing intact Oropharynx- clear Neck- supple Lungs- CTA b/l, normal work of breathing Heart- RRR, no murmurs, rubs or gallops  GI- soft, NT, ND Extremities- no clubbing, cyanosis, or edema MS- no significant deformity, age appropriate atrophy Skin- no rash or lesion Psych- euthymic mood, full affect   Labs:   Lab Results  Component Value Date   WBC 7.7 06/08/2017   HGB 15.8 (H) 06/08/2017   HCT 49.8 (H) 06/08/2017   MCV 96.1 06/08/2017   PLT 185 06/08/2017    Recent Labs  Lab 06/08/17 1412 06/11/17 0416  NA 136 138  K 3.3* 3.3*  CL 102 104  CO2 24 23  BUN 13 12  CREATININE 0.66 0.72  CALCIUM 9.0 8.8*  PROT 6.6  --   BILITOT 0.7  --   ALKPHOS 109  --   ALT 11*  --   AST 20  --   GLUCOSE 97 87   Lab Results  Component Value Date   TROPONINI <0.03 06/08/2017   Lab Results  Component Value Date   CHOL 177 06/09/2017   Lab Results  Component Value Date   HDL 61 06/09/2017   Lab Results  Component Value Date   LDLCALC 101 (H) 06/09/2017   Lab Results  Component Value Date   TRIG 74 06/09/2017   Lab Results  Component Value Date   CHOLHDL 2.9 06/09/2017   No results found for: LDLDIRECT   No results found for: DDIMER   Radiology/Studies:  Dg Chest 1 View Result Date: 06/08/2017 CLINICAL DATA:  Right-sided weakness. EXAM: CHEST 1 VIEW COMPARISON:  06/14/2010.  03/11/2004.  CT 10/16/2001. FINDINGS: Mediastinum and hilar structures normal. Cardiomegaly with normal pulmonary vascularity. Density is noted right upper lobe. Although this could represent focal mild infiltrate, focal mass lesion cannot be excluded. Follow-up PA and lateral chest x-ray suggested. This density does not clear nonenhanced chest CT suggested.  Mild cardiomegaly. No pulmonary venous congestion. Small bilateral pleural effusions. No pneumothorax. Degenerative changes scoliosis thoracic spine . IMPRESSION: 1. Small ill-defined density is in the right upper lobe. Although this could represent a focal infiltrate, focal mass lesion cannot be excluded . Close follow-up PA lateral chest x-ray suggested. This density does not clear nonenhanced chest CT suggested . 2. Small bilateral pleural effusions. 3. Mild cardiomegaly.  No pulmonary venous congestion. Electronically Signed   By: Marcello Moores  Register   On: 06/08/2017 14:58    Ct Head Wo Contrast Result Date: 06/08/2017 CLINICAL DATA:  Focal neural deficit. EXAM: CT HEAD WITHOUT CONTRAST TECHNIQUE: Contiguous axial images were obtained from the base of the skull through the vertex without intravenous contrast. COMPARISON:  CT scan of June 14, 2010. FINDINGS: Brain: Mild diffuse cortical atrophy is noted. Mild chronic ischemic white matter disease is noted. Old bilateral alignment lacunar infarctions are noted. No mass effect or midline shift is noted. Ventricular size is within normal limits. There is no evidence of mass lesion, hemorrhage or acute infarction. Vascular: No hyperdense vessel or unexpected calcification. Skull: Normal. Negative for fracture or focal lesion. Sinuses/Orbits: Mild right ethmoid sinusitis is noted. Other: None. IMPRESSION: Mild diffuse cortical  atrophy. Mild chronic ischemic white matter disease. No acute intracranial abnormality seen. Electronically Signed   By: Marijo Conception, M.D.   On: 06/08/2017 15:06    Mr Jodene Nam Neck W Wo Contrast Result Date: 06/10/2017 CLINICAL DATA:  71 y/o  F; stroke follow-up evaluation. EXAM: MRA NECK WITHOUT AND WITH CONTRAST MRA NECK WITHOUT AND WITH CONTRAST TECHNIQUE: Multiplanar and multiecho pulse sequences of the neck were obtained without and with intravenous contrast. Angiographic images of the neck were obtained using MRA technique without and with intravenous contrast. Angiographic images of the head were obtained using MRA technique without intravenous contrast. CONTRAST:  28mL MULTIHANCE GADOBENATE DIMEGLUMINE 529 MG/ML IV SOLN COMPARISON:  06/08/2017 MRI of the head FINDINGS: MRA neck: Aortic arch: Patent. Right common carotid artery: Patent. Right internal carotid artery: Patent. Right vertebral artery: Patent. Left common carotid artery: Patent. Left Internal carotid artery: Patent. Left Vertebral artery: Patent. There is no evidence of hemodynamically significant stenosis by NASCET criteria, occlusion, or aneurysm. MRA head: Internal carotid arteries:  Patent. Anterior cerebral arteries:  Patent. Middle cerebral arteries: Patent. Anterior communicating artery: Patent. Posterior communicating arteries: Fetal left PCA. No right posterior communicating artery identified, likely hypoplastic or absent. Posterior cerebral arteries:  Patent. Basilar artery:  Patent. Vertebral arteries:  Patent. No evidence of high-grade stenosis, large vessel occlusion, or aneurysm. IMPRESSION: 1. Normal MRA of the neck. 2. Normal MRA of the head. Electronically Signed   By: Kristine Garbe M.D.   On: 06/10/2017 01:39      12-lead ECG SR only All prior EKG's in EPIC reviewed with no documented atrial fibrillation  Telemetry reviewed by Dr. Lovena Le, SR only  Assessment and Plan:  1. Cryptogenic stroke The patient  presents with cryptogenic stroke.  The patient is pending TEE scheduling.  Dr. Lovena Le has seen and examined the patient and spoke at length with the patient about monitoring for afib with either a 30 day event monitor or an implantable loop recorder.  Risks, benefits, and alteratives to implantable loop recorder were discussed with the patient today.   At this time, the patient is very clear in their decision to proceed with implantable loop recorder, we will proceed once TEE is completed if unrevealing.   Wound care was reviewed with the  patient (keep incision clean and dry for 3 days).  Wound check will be scheduled for the patient  Please call with questions.   Renee Dyane Dustman, PA-C 06/11/2017  EP attending patient seen and examined.  Agree with the findings as noted above.  The patient presents with a cryptogenic stroke and consideration for TEE followed by insertion of an implantable loop recorder if the TEE is unrevealing.  I have discussed the indications for the procedure along with the risk, benefits, goals, and expectations and she wishes to proceed.  Crissie Sickles, MD

## 2017-06-11 NOTE — Progress Notes (Addendum)
PROGRESS NOTE    Shelley [name]  UVO:[id number] DOB: 08/13/1945 DOA: 06/08/2017 PCP: [name], MD    Brief Narrative:71 y.o.femaleCaucasian with past medical history of anxiety, hypertension, seizures who is being transferred from a hospital for stroke workup.  Patient reports waking upon 12 7.18 morningwith diplopia. Her husband noted that her left eye was deviated upward and lateral. Patient also appeared to have some difficulty walking. Her symptoms did not improve today, [facility] multiple [facility]cts and patient was [facility] to [facility] course further stroke workup.     Assessment & Plan:   Principal Problem:   Acute lacunar stroke [facility]) Active Problems:   Hypertension   Anxiety   Seizures (HCC)   Diplopia   Stroke (cerebrum) (HCC)   Benign essential HTN   Diastolic dysfunction   Hypokalemia   -MULTIPLE ACUTE LACUNAR INFARCT-NORMAL MRA OF HEAD AND NECK.MRI -Acute lacunar infarct in the left dorsal pons, likely affecting the left medial longitudinal fasciculus (MLF) in this clinical setting. 2. Acute lacunar infarcts also in the right cerebellum and right thalamus. 3. No intracranial hemorrhage or mass effect. 4. 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 -DIPLOPIA -HTN on hydralazine. -GAD -EPILEPSY on keppra -hypokalemia-kdur     DVT prophylaxis:lovenox  Code Status full Family Communication none Disposition Plan:tbd Consultants:  neuro Procedures:none Antimicrobials: none Subjective:vision blurry...but better   Objective:resting in bed.influenza nad.. Vitals:   06/10/17 1653 06/10/17 2200 06/11/17 0226 06/11/17 0525  BP:  (!) 175/103 (!) 162/90 (!) 174/103  Pulse: 82 78 80 87  Resp: 17 18 18 18   Temp: 98.6 F (37 C) 99 F (37.2 C) 98.2 F (36.8 C) 98.6 F (37 C)  TempSrc: Oral Oral Oral Oral    SpO2: 99% 96% 97% 97%  Weight:      Height:        Intake/Output Summary (Last 24 hours) at 06/11/2017 1107 Last data filed at 06/11/2017 0500 Gross per 24 hour  Intake 100 ml  Output 600 ml  Net -500 ml   Filed Weights   06/09/17 2021  Weight: 74 kg (163 lb 2.2 oz)    Examination:  General exam: Appears calm and comfortable  Respiratory system: Clear to auscultation. Respiratory effort normal. Cardiovascular system: S1 & S2 heard, RRR. No JVD, murmurs, rubs, gallops or clicks. No pedal edema. Gastrointestinal system: Abdomen is nondistended, soft and nontender. No organomegaly or masses felt. Normal bowel sounds heard. Central nervous system: Alert and oriented Extremities: Symmetric 5 x 5 power. Skin: No rashes, lesions or ulcers Psychiatry: Judgement and insight appear normal. Mood & affect appropriate.     Data Reviewed: I have personally reviewed following labs and imaging studies  CBC: Recent Labs  Lab 06/08/17 1412  WBC 7.7  NEUTROABS 5.6  HGB 15.8*  HCT 49.8*  MCV 96.1  PLT 742   Basic Metabolic Panel: Recent Labs  Lab 06/08/17 1412 06/11/17 0416  NA 136 138  K 3.3* 3.3*  CL 102 104  CO2 24 23  GLUCOSE 97 87  BUN 13 12  CREATININE 0.66 0.72  CALCIUM 9.0 8.8*   GFR: Estimated Creatinine Clearance: 64 mL/min (by C-G formula based on SCr of 0.72 mg/dL). Liver Function Tests: Recent Labs  Lab 06/08/17 1412  AST 20  ALT 11*  ALKPHOS 109  BILITOT 0.7  PROT 6.6  ALBUMIN 3.8   No results for input(s): LIPASE, AMYLASE in the last 168  hours. No results for input(s): AMMONIA in the last 168 hours. Coagulation Profile: Recent Labs  Lab 06/08/17 1412  INR 1.02   Cardiac Enzymes: Recent Labs  Lab 06/08/17 1412  TROPONINI <0.03   BNP (last 3 results) No results for input(s): PROBNP in the last 8760 hours. HbA1C: Recent Labs    06/09/17 0513  HGBA1C 5.6   CBG: No results for input(s): GLUCAP in the last 168 hours. Lipid  Profile: Recent Labs    06/09/17 0513  CHOL 177  HDL 61  LDLCALC 101*  TRIG 74  CHOLHDL 2.9   Thyroid Function Tests: No results for input(s): TSH, T4TOTAL, FREET4, T3FREE, THYROIDAB in the last 72 hours. Anemia Panel: No results for input(s): VITAMINB12, FOLATE, FERRITIN, TIBC, IRON, RETICCTPCT in the last 72 hours. Sepsis Labs: No results for input(s): PROCALCITON, LATICACIDVEN in the last 168 hours.  No results found for this or any previous visit (from the past 240 hour(s)).       Radiology Studies: Mr Jodene Nam Neck W Wo Contrast  Result Date: 06/10/2017 CLINICAL DATA:  71 y/o  F; stroke follow-up evaluation. EXAM: MRA NECK WITHOUT AND WITH CONTRAST MRA NECK WITHOUT AND WITH CONTRAST TECHNIQUE: Multiplanar and multiecho pulse sequences of the neck were obtained without and with intravenous contrast. Angiographic images of the neck were obtained using MRA technique without and with intravenous contrast. Angiographic images of the head were obtained using MRA technique without intravenous contrast. CONTRAST:  28mL MULTIHANCE GADOBENATE DIMEGLUMINE 529 MG/ML IV SOLN COMPARISON:  06/08/2017 MRI of the head FINDINGS: MRA neck: Aortic arch: Patent. Right common carotid artery: Patent. Right internal carotid artery: Patent. Right vertebral artery: Patent. Left common carotid artery: Patent. Left Internal carotid artery: Patent. Left Vertebral artery: Patent. There is no evidence of hemodynamically significant stenosis by NASCET criteria, occlusion, or aneurysm. MRA head: Internal carotid arteries:  Patent. Anterior cerebral arteries:  Patent. Middle cerebral arteries: Patent. Anterior communicating artery: Patent. Posterior communicating arteries: Fetal left PCA. No right posterior communicating artery identified, likely hypoplastic or absent. Posterior cerebral arteries:  Patent. Basilar artery:  Patent. Vertebral arteries:  Patent. No evidence of high-grade stenosis, large vessel occlusion, or  aneurysm. IMPRESSION: 1. Normal MRA of the neck. 2. Normal MRA of the head. Electronically Signed   By: Kristine Garbe M.D.   On: 06/10/2017 01:39   Mr Jodene Nam Head/brain IO Cm  Result Date: 06/10/2017 CLINICAL DATA:  71 y/o  F; stroke follow-up evaluation. EXAM: MRA NECK WITHOUT AND WITH CONTRAST MRA NECK WITHOUT AND WITH CONTRAST TECHNIQUE: Multiplanar and multiecho pulse sequences of the neck were obtained without and with intravenous contrast. Angiographic images of the neck were obtained using MRA technique without and with intravenous contrast. Angiographic images of the head were obtained using MRA technique without intravenous contrast. CONTRAST:  32mL MULTIHANCE GADOBENATE DIMEGLUMINE 529 MG/ML IV SOLN COMPARISON:  06/08/2017 MRI of the head FINDINGS: MRA neck: Aortic arch: Patent. Right common carotid artery: Patent. Right internal carotid artery: Patent. Right vertebral artery: Patent. Left common carotid artery: Patent. Left Internal carotid artery: Patent. Left Vertebral artery: Patent. There is no evidence of hemodynamically significant stenosis by NASCET criteria, occlusion, or aneurysm. MRA head: Internal carotid arteries:  Patent. Anterior cerebral arteries:  Patent. Middle cerebral arteries: Patent. Anterior communicating artery: Patent. Posterior communicating arteries: Fetal left PCA. No right posterior communicating artery identified, likely hypoplastic or absent. Posterior cerebral arteries:  Patent. Basilar artery:  Patent. Vertebral arteries:  Patent. No evidence of high-grade stenosis, large vessel  occlusion, or aneurysm. IMPRESSION: 1. Normal MRA of the neck. 2. Normal MRA of the head. Electronically Signed   By: Kristine Garbe M.D.   On: 06/10/2017 01:39        Scheduled Meds: . aspirin  300 mg Rectal Daily   Or  . aspirin  325 mg Oral Daily  . enoxaparin (LOVENOX) injection  40 mg Subcutaneous Q24H  . levETIRAcetam  500 mg Oral BID   Continuous  Infusions:   LOS: 3 days      Georgette Shell, MD [facility] If 7PM-7AM, please contact night-coverage [email or web address] Password [email or web address] 06/11/2017, 11:07 AM

## 2017-06-11 NOTE — Progress Notes (Signed)
STROKE TEAM PROGRESS NOTE   HISTORY OF PRESENT ILLNESS (per record) Shelley [name] is an 71 y.o. female Caucasian with past medical history of anxiety, hypertension, seizures who is being transferred from a hospital for stroke workup.   Patient reports waking up on 12 7.18 morning  with diplopia. Her husband noted that her left eye was deviated upward and lateral. Patient also appeared to have some difficulty walking. Her symptoms did not improve today, therefore the patient presented to the [facility] for evaluation. MRI showed multiple infarcts and patient was transported to [facility] course further stroke workup.  Date last known well: 12.7.18 tPA Given: no, outside window  NIHSS: 0 Baseline MRS 0   SUBJECTIVE (INTERVAL HISTORY) Her family is not at bedside, she is alert and oriented and pleasant. She reports no complaints today.    OBJECTIVE Temp:  [98.2 F (36.8 C)-99 F (37.2 C)] 98.6 F (37 C) (12/10 0525) Pulse Rate:  [77-87] 77 (12/10 1115) Cardiac Rhythm: (P) Normal sinus rhythm (12/10 0800) Resp:  [16-18] 16 (12/10 1115) BP: (150-175)/(90-103) 150/91 (12/10 1115) SpO2:  [94 %-99 %] 94 % (12/10 1115)  CBC:  Recent Labs  Lab 06/08/17 1412  WBC 7.7  NEUTROABS 5.6  HGB 15.8*  HCT 49.8*  MCV 96.1  PLT 130    Basic Metabolic Panel:  Recent Labs  Lab 06/08/17 1412 06/11/17 0416  NA 136 138  K 3.3* 3.3*  CL 102 104  CO2 24 23  GLUCOSE 97 87  BUN 13 12  CREATININE 0.66 0.72  CALCIUM 9.0 8.8*    Lipid Panel:     Component Value Date/Time   CHOL 177 06/09/2017 0513   TRIG 74 06/09/2017 0513   HDL 61 06/09/2017 0513   CHOLHDL 2.9 06/09/2017 0513   VLDL 15 06/09/2017 0513   LDLCALC 101 (H) 06/09/2017 0513   HgbA1c:  Lab Results  Component Value Date   HGBA1C 5.6 06/09/2017   Urine Drug Screen:     Component Value Date/Time   LABOPIA NONE DETECTED 06/08/2017 2055   COCAINSCRNUR NONE DETECTED 06/08/2017 2055   LABBENZ  NONE DETECTED 06/08/2017 2055   AMPHETMU NONE DETECTED 06/08/2017 2055   THCU NONE DETECTED 06/08/2017 2055   LABBARB NONE DETECTED 06/08/2017 2055    Alcohol Level     Component Value Date/Time   [facility] <10 06/08/2017 1412    IMAGING  Mr Jodene Nam Neck W Wo Contrast 06/10/2017 IMPRESSION:  1. Normal MRA of the neck.  2. Normal MRA of the head.     Mr Brain Wo Contrast (neuro Protocol) 06/08/2017  IMPRESSION:  1. Acute lacunar infarct in the left dorsal pons, likely affecting the left medial longitudinal fasciculus (MLF) in this clinical setting.  2. Acute lacunar infarcts also in the right cerebellum and right thalamus.  3. No intracranial hemorrhage or mass effect.  4. 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    Transthoracic Echocardiogram  06/09/2017 Study Conclusions - Left ventricle: The cavity size was normal. Wall thickness was   increased in a pattern of mild LVH. Systolic function was normal.   The estimated ejection fraction was in the range of 55% to 60%.   Features are consistent with a pseudonormal left ventricular   filling pattern, with concomitant abnormal relaxation and   increased filling pressure (grade 2 diastolic dysfunction). - Right atrium: The atrium was mildly dilated. - Atrial septum: No defect or patent foramen ovale was identified.  PHYSICAL EXAM Vitals:   06/10/17 2200 06/11/17 0226 06/11/17 0525 06/11/17 1115  BP: (!) 175/103 (!) 162/90 (!) 174/103 (!) 150/91  Pulse: 78 80 87 77  Resp: 18 18 18 16   Temp: 99 F (37.2 C) 98.2 F (36.8 C) 98.6 F (37 C)   TempSrc: Oral Oral Oral   SpO2: 96% 97% 97% 94%  Weight:      Height:        PHYSICAL EXAM Physical exam: Exam: Gen: NAD Eyes: anicteric sclerae, moist conjunctivae                    CV: no MRG, no carotid bruits, no peripheral edema Mental Status: Alert, follows commands, good historian  Neuro: Detailed Neurologic Exam  Speech:     No aphasia, no dysarthria  Cranial Nerves:    The pupils are equal, round, and reactive to light.. Attempted, Fundi not visualized.  Dysconjugate gaze, left internuclear ophthalmoplegia. . No gaze preference. Visual fields full. Face symmetric, Tongue midline. Hearing intact to voice. Shoulder shrug intact  Motor Observation:    no involuntary movements noted. Tone appears normal.     Strength:    5/5     Sensation:  Intact to LT  Plantars downgoing.   ASSESSMENT/PLAN Ms. GERLEAN [name] is a 71 y.o. female with history of anxiety, ETOH hx,  hypertension, and seizures  presenting with diplopia and gait difficulties. She did not receive IV t-PA due to late presentation.  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  MRI head - acute lacunar infarcts in the left dorsal pons, right cerebellum, and right thalamus,  MRA head - normal  MRA neck -normal  Carotid Doppler -  MRA neck  2D Echo - EF 55-60%.  No cardiac source of emboli identified. Will need TEE and possible loop.  LDL -101  HgbA1c -  5.6   VTE prophylaxis - Lovenox  Diet Heart Room service appropriate? Yes; Fluid consistency: Thin  aspirin 81 mg daily prior to admission, now on aspirin 325 mg daily. Change to Plavix at discharge.  Patient counseled to be compliant with her antithrombotic medications  Ongoing aggressive stroke risk factor management  Therapy recommendations:  CIR recommended  Disposition:  Pending  Hypertension  Stable  Permissive hypertension (OK if < 220/120) but gradually normalize in 5-7 days  Long-term BP goal normotensive  Hyperlipidemia  Home meds:  no lipid-lowering medications prior to admission  LDL 101, goal < 70  And Lipitor 40 mg daily  Continue statin at discharge    Other Stroke Risk Factors  Advanced age  Cigarette smoker -  advised to stop smoking  Overweight, Body mass index is 27.15 kg/m., recommend weight loss, diet and exercise as appropriate    Other Active Problems  Mild hypokalemia - 3.3 - recheck in a.m.  Seizure history  Keppra 500 mg twice daily  EtOH history   Plan / Recommendations TEE / loop not necessary and will cancel. Discussed with Dr. Zigmund Daniel.  Hospital day # 3  Personally examined patient and images, and have participated in and made any corrections needed to history, physical, neuro exam,assessment and plan as stated above.  I have personally obtained the history, evaluated lab date, reviewed imaging studies and agree with radiology interpretations. I think patient's has multiple posterior cerebral circulation strokes but they all lacunar infarct from small  vessel disease. I do not believe the loop recorder is necessary. Agree with changing aspirin to Plavix for stroke prevention and aggressive risk factor modification. Transfer to rehabilitation when bed available. Consider eye patch for diplopia if it does not improve. Discussed with Dr. Zigmund Daniel. Greater than 50% time during this 25 minute visit was spent on counseling and coordination of care about her lacunar brainstem infarcts and answered questions. Stroke team will sign off. Follow-up as an outpatient in stroke clinic in 6 weeks.   Antony Contras MD Stroke Neurology  To contact Stroke Continuity provider, please refer to [email or web address] After hours, contact General Neurology

## 2017-06-11 NOTE — Progress Notes (Signed)
Inpatient Rehabilitation  Met with patient at bedside to discuss team's recommendation for IP Rehab.  Shared booklets and answered questions.  Patient would like to discuss IP Rehab option with her husband; however, unable to complete call to him due to likely power outage in his area.  Plan to initiate insurance and follow up with patient tomorrow.  Call if questions.    Addendum: I am unable to initiate insurance authorization due to patient's in hospital name and the name on her [facility] Medicare card being different.  Will attempt to follow up with patient's spouse tomorrow.   Carmelia Roller., CCC/SLP Admission Coordinator  [facility]  Cell [phone]

## 2017-06-12 ENCOUNTER — Inpatient Hospital Stay (HOSPITAL_COMMUNITY): Payer: Medicare Other

## 2017-06-12 ENCOUNTER — Encounter (HOSPITAL_COMMUNITY)
Admission: EM | Disposition: A | Discharge status: Rehabilitation Facility | Payer: Self-pay | Source: Home / Self Care | Attending: Internal Medicine

## 2017-06-12 SURGERY — LOOP RECORDER INSERTION
Anesthesia: LOCAL

## 2017-06-12 MED ORDER — METOPROLOL TARTRATE 25 MG PO TABS: 25.0000 mg | ORAL_TABLET | Freq: Two times a day (BID) | ORAL | 0 refills | Status: DC

## 2017-06-12 MED ORDER — METOPROLOL TARTRATE 25 MG PO TABS: 25.0000 mg | ORAL_TABLET | Freq: Two times a day (BID) | ORAL | 11 refills | Status: DC

## 2017-06-12 MED ORDER — ASPIRIN 325 MG PO TABS: 325.0000 mg | ORAL_TABLET | Freq: Every day | ORAL | Status: DC

## 2017-06-12 MED ORDER — ATENOLOL 25 MG PO TABS: 25.0000 mg | ORAL_TABLET | Freq: Two times a day (BID) | ORAL | Status: DC

## 2017-06-12 MED ORDER — METOPROLOL TARTRATE 25 MG PO TABS
25.0000 mg | ORAL_TABLET | Freq: Two times a day (BID) | ORAL | Status: DC
  Administered 2017-06-12 – 2017-06-13 (×3): 25 mg via ORAL
  Filled 2017-06-12 (×3): qty 1

## 2017-06-12 MED ORDER — CLOPIDOGREL BISULFATE 75 MG PO TABS: 75.0000 mg | ORAL_TABLET | Freq: Every day | ORAL | 11 refills | Status: DC

## 2017-06-12 MED ORDER — MAGNESIUM OXIDE 400 MG PO TABS: 400.0000 mg | ORAL_TABLET | Freq: Two times a day (BID) | ORAL | 0 refills | Status: DC

## 2017-06-12 NOTE — Progress Notes (Signed)
  Speech Language Pathology Treatment: Cognitive-Linquistic  Patient Details Name: Shelley [name] MRN: [id number] DOB: 09-Jan-1946 Today's Date: 06/12/2017 Time: 1009-1030 SLP Time Calculation (min) (ACUTE ONLY): 21 min  Assessment / Plan / Recommendation Clinical Impression  Friendly and conversant; disoriented to age, elements of time, but not place nor situation.  Requires mod verbal cues for recall of word lists and events; mod assist for problem solving through phone and call bell use.  Poor insight, but works.  Continue acute care SLP.   HPI HPI: Shelley Maciolek Wilsonis a 71 y.o.femalewith a history of hypertension, CVA, anxiety, seizure disorder. Admitted after waking up yesterday morning with diplopia, difficulty walking as her right legs appeared heavy. MRI showed acute lacunar infarct in left dorsal pons, likely affecting the left medial longitudinal fasciculus and acute lacunar infarcts in the right cerebellum and right thalamus      SLP Plan  Continue with current plan of care       Recommendations                   Follow up Recommendations: Inpatient Rehab Plan: Continue with current plan of care       GO                Shelley [name] 06/12/2017, 10:34 AM

## 2017-06-12 NOTE — Progress Notes (Addendum)
Inpatient Rehabilitation  Spoke with admitting and patient has alternative names on record [name] being one of them as listed on her [facility] Medicare card and [place] driver's licence.  As a result, I initiated insurance authorization.  Attempted to call patient's spouse again; however, line remains busy.  Plan to continue to follow for timing of medical readiness, insurance authorization, patient and spouse's decision, as well as IP Rehab bed availability.  Call if questions.    Addendum: I met with spouse and patient at bedside to discuss IP Rehab.  Patient's husband reports that he is unable to provide physical assistance to her and is in favor of our rehab program.  Plan to follow up tomorrow regarding insurance authorization and IP Rehab bed availability, if patient remains in house.  Discussed case with nurse case manager and CSW.   Carmelia Roller., CCC/SLP Admission Coordinator  [place]  Cell [phone]

## 2017-06-12 NOTE — Care Management Important Message (Signed)
Important Message  Patient Details  Name: Shelley [name] MRN: [id number] Date of Birth: 1946-06-05   Medicare Important Message Given:  Yes    [name] 06/12/2017, 3:14 PM

## 2017-06-12 NOTE — Progress Notes (Signed)
Patient had another burst of svt returning to sinus rhythm on monitor.Patient asymptomatic denies chest pain ,shortness of breath,a or palpitations.Will continue to monitor patient.

## 2017-06-12 NOTE — Progress Notes (Signed)
Patient not NPO for TEE this morning. TEE moved to 12.12.18 @ 1100. According to notes, patient having runs of SVT throughout the night. Spoke with EP NP and informed that loop was cancelled per neuro. Please let Endoscopy [phone]) know if TEE needs to be cancelled as well.

## 2017-06-12 NOTE — NC FL2 (Signed)
[place] MEDICAID FL2 LEVEL OF CARE SCREENING TOOL     IDENTIFICATION  Patient Name: Shelley [name] Birthdate: 15-Nov-1945 Sex: female Admission Date (Current Location): 06/08/2017  [facility] and [place] Number:  [employer] and Address:  [facility] [place] [place] [address], [place], [place] [address]      Provider Number: [id number]  Attending Physician Name and Address:  Georgette Shell, MD  Relative Name and Phone Number:       Current Level of Care: Hospital Recommended Level of Care: [place] Prior Approval Number:    Date Approved/Denied:   PASRR Number: Pending  Discharge Plan: SNF    Current Diagnoses: Patient Active Problem List   Diagnosis Date Noted  . Benign essential HTN   . Diastolic dysfunction   . Hypokalemia   . Stroke (cerebrum) ([place]) 06/10/2017  . Acute lacunar stroke ([place]) 06/08/2017  . Diplopia 06/08/2017  . Hypertension   . Anxiety   . Seizures ([place])   . Encounter for screening colonoscopy 05/14/2012    Orientation RESPIRATION BLADDER Height & Weight     Self, Time, Situation, Place  Normal Incontinent, External catheter(catheter placed 06/08/17) Weight: 163 lb 2.2 oz (74 kg) Height:  5\' 5"  (165.1 cm)  BEHAVIORAL SYMPTOMS/MOOD NEUROLOGICAL BOWEL NUTRITION STATUS    Convulsions/Seizures Incontinent Diet(heart healthy)  AMBULATORY STATUS COMMUNICATION OF NEEDS Skin   Extensive Assist Verbally Normal                       Personal Care Assistance Level of Assistance  Bathing, Feeding, Dressing Bathing Assistance: Maximum assistance Feeding assistance: Independent Dressing Assistance: Maximum assistance     Functional Limitations Info  Sight, Hearing, Speech Sight Info: Impaired Hearing Info: Adequate Speech Info: Adequate    SPECIAL CARE FACTORS FREQUENCY  PT (By licensed PT), OT (By licensed OT)     PT Frequency: 5x/wk OT Frequency: 5x/wk            Contractures  Contractures Info: Not present    Additional Factors Info  Code Status, Allergies Code Status Info: full Allergies Info: Acyclovir And Related           Current Medications (06/12/2017):  This is the current hospital active medication list Current Facility-Administered Medications  Medication Dose Route Frequency Provider Last Rate Last Dose  . acetaminophen (TYLENOL) tablet 650 mg  650 mg Oral Q4H PRN Truett Mainland, DO   650 mg at 06/09/17 1143   Or  . acetaminophen (TYLENOL) solution 650 mg  650 mg Per Tube Q4H PRN Truett Mainland, DO       Or  . acetaminophen (TYLENOL) suppository 650 mg  650 mg Rectal Q4H PRN Truett Mainland, DO      . albuterol (PROVENTIL) (2.5 MG/3ML) 0.083% nebulizer solution 2.5 mg  2.5 mg Nebulization Q6H PRN Georgette Shell, MD      . aspirin suppository 300 mg  300 mg Rectal Daily Truett Mainland, DO       Or  . aspirin tablet 325 mg  325 mg Oral Daily Truett Mainland, DO   325 mg at 06/12/17 1123  . enoxaparin (LOVENOX) injection 40 mg  40 mg Subcutaneous Q24H Truett Mainland, DO   40 mg at 06/11/17 2157  . hydrALAZINE (APRESOLINE) injection 10 mg  10 mg Intravenous Q4H PRN Truett Mainland, DO   10 mg at 06/08/17 2237  . levETIRAcetam (KEPPRA) tablet 500  mg  500 mg Oral BID Truett Mainland, DO   500 mg at 06/12/17 1123  . metoprolol tartrate (LOPRESSOR) tablet 25 mg  25 mg Oral BID Georgette Shell, MD   25 mg at 06/12/17 1508  . senna-docusate (Senokot-S) tablet 1 tablet  1 tablet Oral QHS PRN Truett Mainland, DO   1 tablet at 06/11/17 2157     Discharge Medications: Please see discharge summary for a list of discharge medications.  Relevant Imaging Results:  Relevant Lab Results:   Additional Information SS#: [id number]  Geralynn Ochs, LCSW

## 2017-06-12 NOTE — Progress Notes (Signed)
Patient had burst of svt returning to sinus rhythm.Patient asymptomatic denies chest pain, shortness of breath,or palpitations.Will continue to monitor patient.

## 2017-06-12 NOTE — Progress Notes (Signed)
Text paged Forrest Moron NP to notify of bursts of svt.No new orders received at present time. Will continue to monitor.

## 2017-06-12 NOTE — Progress Notes (Addendum)
Occupational Therapy Treatment Patient Details Name: Shelley [name] MRN: [id number] DOB: [birth year] Today's Date: 06/12/2017    History of present illness Patient reports waking up on 12 7.18 morning  with diplopia.  Her husband noted that her left eye was deviated upward and lateral.  Patient also appeared to have some difficulty walking.  Her symptoms did not improve today, therefore the patient presented to the [facility] for evaluation. MRI showed acute infarcts Lt dorsal pons, Rt cerebellar and thalamus   OT comments  Pt progressing towards goals; completing bed mobility and stand pivot transfer using RW with overall minA, requires increased time/effort for all mobility. Pt noted to have incontinent BM start of session, requires MaxA for pericare while standing at RW. Pt continues to require increased time and one step directions for completing tasks. Feel CIR recommendation remains appropriate at this time to maximize Pt's safety and independence with ADLs and mobility prior to return home. Will continue to follow while in acute setting.    Follow Up Recommendations  CIR    Equipment Recommendations  Other (comment)(defer to next venue )          Precautions / Restrictions Precautions Precautions: Fall Restrictions Weight Bearing Restrictions: No       Mobility Bed Mobility Overal bed mobility: Needs Assistance Bed Mobility: Supine to Sit     Supine to sit: Min guard;HOB elevated     General bed mobility comments: increased time and effort, min guard for safety   Transfers Overall transfer level: Needs assistance Equipment used: Rolling walker (2 wheeled) Transfers: Sit to/from [facility] Sit to Stand: Min assist Stand pivot transfers: Min assist       General transfer comment: Assist to power to standing with cues for hand placement/technique. Cues for maintaining upright position; Pt completes stand pivot to recliner with MinA, verbal  cues for technique using RW and advancing LEs, requires increased time; Pt fearful of falling     Balance Overall balance assessment: Needs assistance Sitting-balance support: Feet supported;No upper extremity supported Sitting balance-Leahy Scale: Fair Sitting balance - Comments: static sitting EOB; slight posterior bias    Standing balance support: During functional activity;Bilateral upper extremity supported Standing balance-Leahy Scale: Poor Standing balance comment: Requires BUE support in standing, no physical assist needed but pt with flexed trunk and leaning on RW; verbal cues for transitioning to more upright posture however Pt unable to maintain in standing                            ADL either performed or assessed with clinical judgement   ADL Overall ADL's : Needs assistance/impaired     Grooming: Set up;Sitting Grooming Details (indicate cue type and reason): supported sitting; Pt applying lotion to LEs (while LEs elevated in recliner)                     Toileting- Clothing Manipulation and Hygiene: Maximal assistance;Sit to/from stand Toileting - Clothing Manipulation Details (indicate cue type and reason): Pt incontinent and unaware of having BM while in bed; required MaxA to complete pericare standing at RW prior to transfer to recliner      Functional mobility during ADLs: Minimal assistance;Rolling walker;Cueing for sequencing;Cueing for safety General ADL Comments: Pt requires assist for peri-care after incontinent BM in bed; Pt completed bed mobility and stood at RW approx 3 min with minGuard for safety during pericare; Pt completed stand pivot  to recliner with MinA, increased time and verbal cues for advancing LEs during transfer; Pt very fearful of falling      Vision   Additional Comments: Pt with reports of no double vision this session; requires min verbal cues for locating lotion to her L during seated grooming ADLs                Cognition Arousal/Alertness: Awake/alert Behavior During Therapy: WFL for tasks assessed/performed Overall Cognitive Status: Impaired/Different from baseline Area of Impairment: Awareness;Memory;Problem solving;Following commands;Safety/judgement                   Current Attention Level: Sustained Memory: Decreased short-term memory Following Commands: Follows one step commands with increased time Safety/Judgement: Decreased awareness of safety Awareness: Intellectual Problem Solving: Slow processing;Requires verbal cues General Comments: Pt unaware of incontinent BM; repeating questions though is intermittently able to recognize she is repeating questions; initially not able to state where she is, when asked later during session Pt able to recall with min verbal cues                           Pertinent Vitals/ Pain       Pain Assessment: Faces Faces Pain Scale: Hurts little more Pain Location: buttocks with peri-care  Pain Descriptors / Indicators: Grimacing Pain Intervention(s): Limited activity within patient's tolerance;Monitored during session                                                          Frequency  Min 2X/week        Progress Toward Goals  OT Goals(current goals can now be found in the care plan section)  Progress towards OT goals: Progressing toward goals  Acute Rehab OT Goals Patient Stated Goal: be able to do like I could before OT Goal Formulation: With patient Time For Goal Achievement: 06/24/17 Potential to Achieve Goals: Good  Plan Discharge plan remains appropriate                     AM-PAC PT "6 Clicks" Daily Activity     Outcome Measure   Help from another person eating meals?: None Help from another person taking care of personal grooming?: A Little Help from another person toileting, which includes using toliet, bedpan, or urinal?: A Lot Help from another person bathing (including  washing, rinsing, drying)?: A Lot Help from another person to put on and taking off regular upper body clothing?: A Little Help from another person to put on and taking off regular lower body clothing?: A Lot 6 Click Score: 16    End of Session Equipment Utilized During Treatment: Rolling walker;Gait belt  OT Visit Diagnosis: Unsteadiness on feet (R26.81);Other abnormalities of gait and mobility (R26.89);Muscle weakness (generalized) (M62.81);Low vision, both eyes (H54.2);Other symptoms and signs involving cognitive function   Activity Tolerance Patient tolerated treatment well   Patient Left in bed;with call bell/phone within reach   Nurse Communication Mobility status        Time: 3500-9381 OT Time Calculation (min): 33 min  Charges: OT General Charges $OT Visit: 1 Visit OT Treatments $Self Care/Home Management : 23-37 mins  Lou Cal, [place] Pager [phone] 06/12/2017    Raymondo Band 06/12/2017, 3:28 PM

## 2017-06-12 NOTE — Progress Notes (Signed)
Physical Therapy Treatment Patient Details Name: Shelley [name] MRN: [id number] DOB: March 29, 1946 Today's Date: 06/12/2017    History of Present Illness Patient reports waking up on 12 7.18 morning  with diplopia.  Her husband noted that her left eye was deviated upward and lateral.  Patient also appeared to have some difficulty walking.  Her symptoms did not improve today, therefore the patient presented to the [facility] for evaluation. MRI showed acute infarcts Lt dorsal pons, Rt cerebellar and thalamus    PT Comments    Patient reports feeling bad today and not getting much sleep. Found sitting in feces. Focused on standing balance and tolerance during pericare. Pt able to stand for ~5 mins without assist but heavily relying on RW for support. Able to side step along side the bed with min A for balance. Declined getting to chair today but reports she will later after resting. Seems to be slightly confused about events. Continues to be a good CIR candidate as she is eager to return to independence. Will follow.   Follow Up Recommendations  CIR     Equipment Recommendations  None recommended by PT    Recommendations for Other Services       Precautions / Restrictions Precautions Precautions: Fall Restrictions Weight Bearing Restrictions: No    Mobility  Bed Mobility Overal bed mobility: Needs Assistance Bed Mobility: Supine to Sit;Sit to Supine     Supine to sit: Min guard;HOB elevated Sit to supine: Min guard;HOB elevated   General bed mobility comments: Increased time to get to EOB, ~5 mins with cues. "be patient with me." no assist needed. Able to return to bed without assist and cues to assist scooting self up in bed.   Transfers Overall transfer level: Needs assistance Equipment used: Rolling walker (2 wheeled) Transfers: Sit to/from Stand Sit to Stand: Min assist         General transfer comment: Assist to power to standing with cues for hand  placement/technique. Cues for upright. Very slow to rise. Declined transferring to chair or ambulation due to not having slept much.   Ambulation/Gait Ambulation/Gait assistance: Min assist Ambulation Distance (Feet): 5 Feet Assistive device: Rolling walker (2 wheeled) Gait Pattern/deviations: Step-to pattern;Trunk flexed Gait velocity: decreased   General Gait Details: Side stepped along side bed with Min A for balance using RW. Decreased foot clearance and bil knee flexion noted.    Stairs            Wheelchair Mobility    Modified Rankin (Stroke Patients Only) Modified Rankin (Stroke Patients Only) Pre-Morbid Rankin Score: No symptoms Modified Rankin: Moderately severe disability     Balance Overall balance assessment: Needs assistance Sitting-balance support: Feet supported;No upper extremity supported Sitting balance-Leahy Scale: Fair Sitting balance - Comments: slight posterior bias, self-corrects   Standing balance support: During functional activity;Bilateral upper extremity supported Standing balance-Leahy Scale: Poor Standing balance comment: Requires BUE support in standing, no physical assist needed but pt with flexed trunk and leaning on RW; total A for pericare.                            Cognition Arousal/Alertness: Awake/alert Behavior During Therapy: WFL for tasks assessed/performed Overall Cognitive Status: Impaired/Different from baseline Area of Impairment: Awareness;Memory;Problem solving;Following commands;Safety/judgement                     Memory: Decreased short-term memory Following Commands: Follows one step commands with  increased time Safety/Judgement: Decreased awareness of safety Awareness: Intellectual Problem Solving: Slow processing;Requires verbal cues General Comments: Pt reports she will be going home today, however per notes will plan to be in hospital for a test tomorrow. Seems confused on events. Found  sitting in feces and unaware.      Exercises      General Comments        Pertinent Vitals/Pain Pain Assessment: No/denies pain    Home Living                      Prior Function            PT Goals (current goals can now be found in the care plan section) Progress towards PT goals: Progressing toward goals(but limited due to BM)    Frequency    Min 4X/week      PT Plan Current plan remains appropriate    Co-evaluation              AM-PAC PT "6 Clicks" Daily Activity  Outcome Measure  Difficulty turning over in bed (including adjusting bedclothes, sheets and blankets)?: None Difficulty moving from lying on back to sitting on the side of the bed? : None Difficulty sitting down on and standing up from a chair with arms (e.g., wheelchair, bedside commode, etc,.)?: A Little Help needed moving to and from a bed to chair (including a wheelchair)?: A Little Help needed walking in hospital room?: A Little Help needed climbing 3-5 steps with a railing? : Total 6 Click Score: 18    End of Session Equipment Utilized During Treatment: Gait belt Activity Tolerance: Patient tolerated treatment well Patient left: in bed;with call bell/phone within reach;with bed alarm set Nurse Communication: Mobility status PT Visit Diagnosis: Unsteadiness on feet (R26.81);Other abnormalities of gait and mobility (R26.89);Muscle weakness (generalized) (M62.81);History of falling (Z91.81)     Time: 7619-5093 PT Time Calculation (min) (ACUTE ONLY): 24 min  Charges:  $Therapeutic Activity: 23-37 mins                    G Codes:       Wray Kearns, PT, DPT [phone]     Marguarite Arbour A Sharnise Blough 06/12/2017, 9:53 AM

## 2017-06-12 NOTE — Discharge Summary (Signed)
Physician Discharge Summary  Shelley [name] JKD:[id number] DOB: 1945/11/18 DOA: 06/08/2017  PCP: [name], MD  Admit date: 06/08/2017 Discharge date: 06/12/2017  Admitted From home Disposition home :Recommendations for Outpatient Follow-up:  1. Follow up with PCP in 1-2 weeks 2. Please obtain BMP/CBC in one week  Home Health:yes Equipment/Devices:none Discharge Condition stable CODE STATUS:full Diet recommendation:cardiac Brief/Interim Summary::71 y.o.femaleCaucasian with past medical history of anxiety, hypertension, seizures who is being transferred from a hospital for stroke workup.  Patient reports waking upon 12 7.18 morningwith diplopia. Her husband noted that her left eye was deviated upward and lateral. Patient also appeared to have some difficulty walking. Her symptoms did not improve today, [facility] showed multiple infarcts and patient was transported to [facility] course further stroke workup.      Discharge Diagnoses:  Principal Problem:   Acute lacunar stroke [facility]) Active Problems:   Hypertension   Anxiety   Seizures (HCC)   Diplopia   Stroke (cerebrum) (HCC)   Benign essential HTN   Diastolic dysfunction   Hypokalemia MULTIPLE ACUTE LACUNAR INFARCT-NORMAL MRA OF HEAD AND NECK.MRI -Acute lacunar infarct in the left dorsal pons, likely affecting the left medial longitudinal fasciculus (MLF) in this clinical setting. 2. Acute lacunar infarcts also in the right cerebellum and right thalamus. 3. No intracranial hemorrhage or mass effect. 4. 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 -DIPLOPIA -HTN start metoprolol bid.she had few bouts ov svt last pm ..asymptomatic.Marland Kitchen -GAD -EPILEPSY on keppra -hypokalemia-kdur      Discharge Instructions   Allergies as of 06/12/2017      Reactions   Acyclovir And Related        Medication List    STOP taking these medications   aspirin 81 MG tablet     TAKE these medications   clopidogrel 75 MG tablet Commonly known as:  PLAVIX Take 1 tablet (75 mg total) by mouth daily.   levETIRAcetam 500 MG tablet Commonly known as:  KEPPRA Take 500 mg by mouth 2 (two) times daily.   lisinopril 10 MG tablet Commonly known as:  PRINIVIL,ZESTRIL Take 10 mg by mouth daily.   metoprolol tartrate 25 MG tablet Commonly known as:  LOPRESSOR Take 1 tablet (25 mg total) by mouth 2 (two) times daily.   metoprolol tartrate 25 MG tablet Commonly known as:  LOPRESSOR Take 1 tablet (25 mg total) by mouth 2 (two) times daily.       Allergies  Allergen Reactions  . Acyclovir And Related     Consultations: Procedures/Studies: Dg Chest 1 View  Result Date: 06/08/2017 CLINICAL DATA:  Right-sided weakness. EXAM: CHEST 1 VIEW COMPARISON:  06/14/2010.  03/11/2004.  CT 10/16/2001. FINDINGS: Mediastinum and hilar structures normal. Cardiomegaly with normal pulmonary vascularity. Density is noted right upper lobe. Although this could represent focal mild infiltrate, focal mass lesion cannot be excluded. Follow-up PA and lateral chest x-ray suggested. This density does not clear nonenhanced chest CT suggested. Mild cardiomegaly. No pulmonary venous congestion. Small bilateral pleural effusions. No pneumothorax. Degenerative changes scoliosis thoracic spine . IMPRESSION: 1. Small ill-defined density is in the right upper lobe. Although this could represent a focal infiltrate, focal mass lesion cannot be excluded . Close follow-up PA lateral chest x-ray suggested. This density does not clear nonenhanced chest CT suggested . 2. Small bilateral pleural effusions. 3. Mild cardiomegaly.  No pulmonary venous congestion. Electronically Signed   By: Marcello Moores  Register  On: 06/08/2017 14:58   Ct Head Wo Contrast  Result Date: 06/08/2017 CLINICAL DATA:  Focal neural deficit. EXAM: CT HEAD WITHOUT  CONTRAST TECHNIQUE: Contiguous axial images were obtained from the base of the skull through the vertex without intravenous contrast. COMPARISON:  CT scan of June 14, 2010. FINDINGS: Brain: Mild diffuse cortical atrophy is noted. Mild chronic ischemic white matter disease is noted. Old bilateral alignment lacunar infarctions are noted. No mass effect or midline shift is noted. Ventricular size is within normal limits. There is no evidence of mass lesion, hemorrhage or acute infarction. Vascular: No hyperdense vessel or unexpected calcification. Skull: Normal. Negative for fracture or focal lesion. Sinuses/Orbits: Mild right ethmoid sinusitis is noted. Other: None. IMPRESSION: Mild diffuse cortical atrophy. Mild chronic ischemic white matter disease. No acute intracranial abnormality seen. Electronically Signed   By: Marijo Conception, M.D.   On: 06/08/2017 15:06   Mr Jodene Nam Neck W Wo Contrast  Result Date: 06/10/2017 CLINICAL DATA:  71 y/o  F; stroke follow-up evaluation. EXAM: MRA NECK WITHOUT AND WITH CONTRAST MRA NECK WITHOUT AND WITH CONTRAST TECHNIQUE: Multiplanar and multiecho pulse sequences of the neck were obtained without and with intravenous contrast. Angiographic images of the neck were obtained using MRA technique without and with intravenous contrast. Angiographic images of the head were obtained using MRA technique without intravenous contrast. CONTRAST:  25mL MULTIHANCE GADOBENATE DIMEGLUMINE 529 MG/ML IV SOLN COMPARISON:  06/08/2017 MRI of the head FINDINGS: MRA neck: Aortic arch: Patent. Right common carotid artery: Patent. Right internal carotid artery: Patent. Right vertebral artery: Patent. Left common carotid artery: Patent. Left Internal carotid artery: Patent. Left Vertebral artery: Patent. There is no evidence of hemodynamically significant stenosis by NASCET criteria, occlusion, or aneurysm. MRA head: Internal carotid arteries:  Patent. Anterior cerebral arteries:  Patent. Middle  cerebral arteries: Patent. Anterior communicating artery: Patent. Posterior communicating arteries: Fetal left PCA. No right posterior communicating artery identified, likely hypoplastic or absent. Posterior cerebral arteries:  Patent. Basilar artery:  Patent. Vertebral arteries:  Patent. No evidence of high-grade stenosis, large vessel occlusion, or aneurysm. IMPRESSION: 1. Normal MRA of the neck. 2. Normal MRA of the head. Electronically Signed   By: Kristine Garbe M.D.   On: 06/10/2017 01:39   Mr Brain Wo Contrast (neuro Protocol)  Result Date: 06/08/2017 CLINICAL DATA:  71 year old female with right-sided weakness since yesterday morning. Ophthalmoplegia. EXAM: MRI HEAD WITHOUT CONTRAST TECHNIQUE: Multiplanar, multiecho pulse sequences of the brain and surrounding structures were obtained without intravenous contrast. COMPARISON:  Head CT without contrast 1448 hours today. Brain MRI 06/15/2010. FINDINGS: Brain: Cerebral volume loss since 2011 appears generalized. Progressed chronic small vessel disease with increased size and number of chronic lacunar infarcts in the bilateral deep gray matter nuclei and pons. Chronically advanced bilateral cerebral white matter T2 and FLAIR hyperintensity is also more patchy and confluent. No definite chronic cerebral blood products or cortical encephalomalacia identified. There is a linear 14 mm area of restricted diffusion in the central right cerebellum with faint FLAIR hyperintensity. No hemorrhage or mass effect. There is a second punctate area of restricted diffusion along the ventral, superior left aspect of the fourth ventricle (series 3, image 76 and series 5, image 52. Associated T2 and FLAIR hyperintensity with no hemorrhage or mass effect. There is also a punctate area of restricted diffusion in the central right thalamus (series 5, image 53). No hemorrhage or mass effect. This is in an area of patchy T2 and FLAIR hyperintensity. No other restricted  diffusion. No midline shift, mass effect, evidence of mass lesion, ventriculomegaly, extra-axial collection or acute intracranial hemorrhage. Cervicomedullary junction and pituitary are within normal limits. Vascular: Major intracranial vascular flow voids are stable since 2011. Skull and upper cervical spine: Negative visualized cervical spine. Normal bone marrow signal. Sinuses/Orbits: Leftward gaze deviation. Otherwise normal orbits soft tissues. Chronic mild mastoid effusions and paranasal sinus mucosal thickening is stable since 20/11. Other: Scalp and orbits soft tissues appear negative. IMPRESSION: 1. Acute lacunar infarct in the left dorsal pons, likely affecting the left medial longitudinal fasciculus (MLF) in this clinical setting. 2. Acute lacunar infarcts also in the right cerebellum and right thalamus. 3. No intracranial hemorrhage or mass effect. 4. 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 Electronically Signed   By: Genevie Ann M.D.   On: 06/08/2017 16:29   Mr Jodene Nam Head/brain EH Cm  Result Date: 06/10/2017 CLINICAL DATA:  71 y/o  F; stroke follow-up evaluation. EXAM: MRA NECK WITHOUT AND WITH CONTRAST MRA NECK WITHOUT AND WITH CONTRAST TECHNIQUE: Multiplanar and multiecho pulse sequences of the neck were obtained without and with intravenous contrast. Angiographic images of the neck were obtained using MRA technique without and with intravenous contrast. Angiographic images of the head were obtained using MRA technique without intravenous contrast. CONTRAST:  23mL MULTIHANCE GADOBENATE DIMEGLUMINE 529 MG/ML IV SOLN COMPARISON:  06/08/2017 MRI of the head FINDINGS: MRA neck: Aortic arch: Patent. Right common carotid artery: Patent. Right internal carotid artery: Patent. Right vertebral artery: Patent. Left common carotid artery: Patent. Left Internal carotid artery: Patent. Left Vertebral artery: Patent. There is no evidence of hemodynamically significant  stenosis by NASCET criteria, occlusion, or aneurysm. MRA head: Internal carotid arteries:  Patent. Anterior cerebral arteries:  Patent. Middle cerebral arteries: Patent. Anterior communicating artery: Patent. Posterior communicating arteries: Fetal left PCA. No right posterior communicating artery identified, likely hypoplastic or absent. Posterior cerebral arteries:  Patent. Basilar artery:  Patent. Vertebral arteries:  Patent. No evidence of high-grade stenosis, large vessel occlusion, or aneurysm. IMPRESSION: 1. Normal MRA of the neck. 2. Normal MRA of the head. Electronically Signed   By: Kristine Garbe M.D.   On: 06/10/2017 01:39    (Echo, Carotid, EGD, Colonoscopy, ERCP)    Subjective:   Discharge Exam: Vitals:   06/12/17 0400 06/12/17 1054  BP: (!) 177/98 (!) 178/102  Pulse: 96 77  Resp: 20 20  Temp: 98.8 F (37.1 C) 98.4 F (36.9 C)  SpO2: 94% 95%   Vitals:   06/11/17 2136 06/12/17 0000 06/12/17 0400 06/12/17 1054  BP: 137/77 (!) 152/82 (!) 177/98 (!) 178/102  Pulse: 82 91 96 77  Resp: 18 20 20 20   Temp: 98.5 F (36.9 C) 98.4 F (36.9 C) 98.8 F (37.1 C) 98.4 F (36.9 C)  TempSrc: Oral Oral Oral Oral  SpO2: 95% 94% 94% 95%  Weight:      Height:        General: Pt is alert, awake, not in acute distress Cardiovascular: RRR, S1/S2 +, no rubs, no gallops Respiratory: CTA bilaterally, no wheezing, no rhonchi Abdominal: Soft, NT, ND, bowel sounds + Extremities: no edema, no cyanosis    The results of significant diagnostics from this hospitalization (including imaging, microbiology, ancillary and laboratory) are listed below for reference.     Microbiology: No results found for this or any previous visit (from the past 240 hour(s)).   Labs: BNP (last 3 results) No results for input(s): BNP in the last 8760 hours. Basic  Metabolic Panel: Recent Labs  Lab 06/08/17 1412 06/11/17 0416  NA 136 138  K 3.3* 3.3*  CL 102 104  CO2 24 23  GLUCOSE 97 87   BUN 13 12  CREATININE 0.66 0.72  CALCIUM 9.0 8.8*   Liver Function Tests: Recent Labs  Lab 06/08/17 1412  AST 20  ALT 11*  ALKPHOS 109  BILITOT 0.7  PROT 6.6  ALBUMIN 3.8   No results for input(s): LIPASE, AMYLASE in the last 168 hours. No results for input(s): AMMONIA in the last 168 hours. CBC: Recent Labs  Lab 06/08/17 1412  WBC 7.7  NEUTROABS 5.6  HGB 15.8*  HCT 49.8*  MCV 96.1  PLT 185   Cardiac Enzymes: Recent Labs  Lab 06/08/17 1412  TROPONINI <0.03   BNP: Invalid input(s): POCBNP CBG: No results for input(s): GLUCAP in the last 168 hours. D-Dimer No results for input(s): DDIMER in the last 72 hours. Hgb A1c No results for input(s): HGBA1C in the last 72 hours. Lipid Profile No results for input(s): CHOL, HDL, LDLCALC, TRIG, CHOLHDL, LDLDIRECT in the last 72 hours. Thyroid function studies No results for input(s): TSH, T4TOTAL, T3FREE, THYROIDAB in the last 72 hours.  Invalid input(s): FREET3 Anemia work up No results for input(s): VITAMINB12, FOLATE, FERRITIN, TIBC, IRON, RETICCTPCT in the last 72 hours. Urinalysis    Component Value Date/Time   COLORURINE YELLOW 06/08/2017 2055   APPEARANCEUR HAZY (A) 06/08/2017 2055   LABSPEC 1.014 06/08/2017 2055   PHURINE 7.0 06/08/2017 2055   GLUCOSEU NEGATIVE 06/08/2017 2055   HGBUR NEGATIVE 06/08/2017 2055   BILIRUBINUR NEGATIVE 06/08/2017 2055   KETONESUR 20 (A) 06/08/2017 2055   PROTEINUR NEGATIVE 06/08/2017 2055   UROBILINOGEN 0.2 06/14/2010 0741   NITRITE NEGATIVE 06/08/2017 2055   LEUKOCYTESUR NEGATIVE 06/08/2017 2055   Sepsis Labs Invalid input(s): PROCALCITONIN,  WBC,  LACTICIDVEN Microbiology No results found for this or any previous visit (from the past 240 hour(s)).   Time coordinating discharge: Over 30 minutes  SIGNED:   Georgette Shell, MD  [facility] 06/12/2017, 12:39 PM Pager   If 7PM-7AM, please contact night-coverage [email or web address] Password [email or web address]

## 2017-06-13 ENCOUNTER — Encounter (HOSPITAL_COMMUNITY): Payer: Self-pay | Admitting: *Deleted

## 2017-06-13 ENCOUNTER — Inpatient Hospital Stay (HOSPITAL_COMMUNITY)
Admission: RE | Admit: 2017-06-13 | Discharge: 2017-06-29 | DRG: 057 | Disposition: A | Discharge status: Home Health | Payer: Medicare Other | Source: Intra-hospital | Attending: Physical Medicine & Rehabilitation | Admitting: Physical Medicine & Rehabilitation

## 2017-06-13 ENCOUNTER — Encounter (HOSPITAL_COMMUNITY)
Admission: EM | Disposition: A | Discharge status: Rehabilitation Facility | Payer: Self-pay | Source: Home / Self Care | Attending: Internal Medicine

## 2017-06-13 DIAGNOSIS — Z79899 Other long term (current) drug therapy: Secondary | ICD-10-CM | POA: Diagnosis not present

## 2017-06-13 DIAGNOSIS — I10 Essential (primary) hypertension: Secondary | ICD-10-CM | POA: Diagnosis present

## 2017-06-13 DIAGNOSIS — G40909 Epilepsy, unspecified, not intractable, without status epilepticus: Secondary | ICD-10-CM | POA: Diagnosis present

## 2017-06-13 DIAGNOSIS — E785 Hyperlipidemia, unspecified: Secondary | ICD-10-CM | POA: Diagnosis not present

## 2017-06-13 DIAGNOSIS — Z7982 Long term (current) use of aspirin: Secondary | ICD-10-CM | POA: Diagnosis not present

## 2017-06-13 DIAGNOSIS — F17210 Nicotine dependence, cigarettes, uncomplicated: Secondary | ICD-10-CM | POA: Diagnosis present

## 2017-06-13 DIAGNOSIS — R569 Unspecified convulsions: Secondary | ICD-10-CM | POA: Diagnosis not present

## 2017-06-13 DIAGNOSIS — I6350 Cerebral infarction due to unspecified occlusion or stenosis of unspecified cerebral artery: Secondary | ICD-10-CM | POA: Diagnosis not present

## 2017-06-13 DIAGNOSIS — F4323 Adjustment disorder with mixed anxiety and depressed mood: Secondary | ICD-10-CM | POA: Diagnosis not present

## 2017-06-13 DIAGNOSIS — Z87898 Personal history of other specified conditions: Secondary | ICD-10-CM | POA: Diagnosis not present

## 2017-06-13 DIAGNOSIS — I63541 Cerebral infarction due to unspecified occlusion or stenosis of right cerebellar artery: Secondary | ICD-10-CM | POA: Diagnosis not present

## 2017-06-13 DIAGNOSIS — I639 Cerebral infarction, unspecified: Secondary | ICD-10-CM | POA: Diagnosis present

## 2017-06-13 DIAGNOSIS — H532 Diplopia: Secondary | ICD-10-CM | POA: Diagnosis present

## 2017-06-13 DIAGNOSIS — I471 Supraventricular tachycardia: Secondary | ICD-10-CM | POA: Diagnosis present

## 2017-06-13 DIAGNOSIS — M810 Age-related osteoporosis without current pathological fracture: Secondary | ICD-10-CM | POA: Diagnosis present

## 2017-06-13 DIAGNOSIS — I69398 Other sequelae of cerebral infarction: Secondary | ICD-10-CM | POA: Diagnosis not present

## 2017-06-13 DIAGNOSIS — R269 Unspecified abnormalities of gait and mobility: Secondary | ICD-10-CM | POA: Diagnosis not present

## 2017-06-13 LAB — CBC
HCT: 48.4 % — ABNORMAL HIGH (ref 36.0–46.0)
Hemoglobin: 15.7 g/dL — ABNORMAL HIGH (ref 12.0–15.0)
MCH: 30.8 pg (ref 26.0–34.0)
MCHC: 32.4 g/dL (ref 30.0–36.0)
MCV: 94.9 fL (ref 78.0–100.0)
PLATELETS: 181 10*3/uL (ref 150–400)
RBC: 5.1 MIL/uL (ref 3.87–5.11)
RDW: 14.7 % (ref 11.5–15.5)
WBC: 7.8 10*3/uL (ref 4.0–10.5)

## 2017-06-13 LAB — CREATININE, SERUM
CREATININE: 0.66 mg/dL (ref 0.44–1.00)
GFR calc non Af Amer: >60 mL/min (ref >60)

## 2017-06-13 SURGERY — ECHOCARDIOGRAM, TRANSESOPHAGEAL
Anesthesia: Moderate Sedation

## 2017-06-13 MED ORDER — ALBUTEROL SULFATE (2.5 MG/3ML) 0.083% IN NEBU: 2.5000 mg | INHALATION_SOLUTION | Freq: Four times a day (QID) | RESPIRATORY_TRACT | Status: DC | PRN

## 2017-06-13 MED ORDER — ENOXAPARIN SODIUM 40 MG/0.4ML ~~LOC~~ SOLN
40.0000 mg | SUBCUTANEOUS | Status: DC
  Administered 2017-06-13 – 2017-06-28 (×15): 40 mg via SUBCUTANEOUS
  Filled 2017-06-13 (×15): qty 0.4

## 2017-06-13 MED ORDER — SENNOSIDES-DOCUSATE SODIUM 8.6-50 MG PO TABS
1.0000 | ORAL_TABLET | Freq: Every evening | ORAL | Status: DC | PRN
  Administered 2017-06-24: 1 via ORAL
  Filled 2017-06-13: qty 1

## 2017-06-13 MED ORDER — CLOPIDOGREL BISULFATE 75 MG PO TABS: 75.0000 mg | ORAL_TABLET | Freq: Every day | ORAL | 0 refills | Status: DC

## 2017-06-13 MED ORDER — ONDANSETRON HCL 4 MG PO TABS
4.0000 mg | ORAL_TABLET | Freq: Four times a day (QID) | ORAL | Status: DC | PRN
  Administered 2017-06-20: 4 mg via ORAL
  Filled 2017-06-13: qty 1

## 2017-06-13 MED ORDER — METOPROLOL TARTRATE 25 MG PO TABS
25.0000 mg | ORAL_TABLET | Freq: Two times a day (BID) | ORAL | Status: DC
  Administered 2017-06-13 – 2017-06-29 (×27): 25 mg via ORAL
  Filled 2017-06-13 (×31): qty 1

## 2017-06-13 MED ORDER — ACETAMINOPHEN 650 MG RE SUPP: 650.0000 mg | RECTAL | Status: DC | PRN

## 2017-06-13 MED ORDER — LEVETIRACETAM 500 MG PO TABS
500.0000 mg | ORAL_TABLET | Freq: Two times a day (BID) | ORAL | Status: DC
  Administered 2017-06-13 – 2017-06-29 (×31): 500 mg via ORAL
  Filled 2017-06-13 (×31): qty 1

## 2017-06-13 MED ORDER — AMLODIPINE BESYLATE 10 MG PO TABS: 10.0000 mg | ORAL_TABLET | Freq: Every day | ORAL | 11 refills | Status: DC

## 2017-06-13 MED ORDER — ATORVASTATIN CALCIUM 20 MG PO TABS: 20.0000 mg | ORAL_TABLET | Freq: Every day | ORAL | 11 refills | Status: DC

## 2017-06-13 MED ORDER — ENOXAPARIN SODIUM 40 MG/0.4ML ~~LOC~~ SOLN: 40.0000 mg | SUBCUTANEOUS | Status: DC

## 2017-06-13 MED ORDER — ACETAMINOPHEN 325 MG PO TABS
650.0000 mg | ORAL_TABLET | ORAL | Status: DC | PRN
  Administered 2017-06-20 – 2017-06-27 (×5): 650 mg via ORAL
  Filled 2017-06-13 (×5): qty 2

## 2017-06-13 MED ORDER — SORBITOL 70 % SOLN
30.0000 mL | Freq: Every day | Status: DC | PRN
  Administered 2017-06-24: 30 mL via ORAL
  Filled 2017-06-13: qty 30

## 2017-06-13 MED ORDER — CLOPIDOGREL BISULFATE 75 MG PO TABS
75.0000 mg | ORAL_TABLET | Freq: Every day | ORAL | Status: DC
  Administered 2017-06-14 – 2017-06-29 (×16): 75 mg via ORAL
  Filled 2017-06-13 (×16): qty 1

## 2017-06-13 MED ORDER — ONDANSETRON HCL 4 MG/2ML IJ SOLN: 4.0000 mg | Freq: Four times a day (QID) | INTRAMUSCULAR | Status: DC | PRN

## 2017-06-13 MED ORDER — CLOPIDOGREL BISULFATE 75 MG PO TABS: 75.0000 mg | ORAL_TABLET | Freq: Every day | ORAL | Status: DC

## 2017-06-13 MED ORDER — ACETAMINOPHEN 160 MG/5ML PO SOLN: 650.0000 mg | ORAL | Status: DC | PRN

## 2017-06-13 MED ORDER — LISINOPRIL 10 MG PO TABS
10.0000 mg | ORAL_TABLET | Freq: Every day | ORAL | Status: DC
  Administered 2017-06-13 – 2017-06-29 (×17): 10 mg via ORAL
  Filled 2017-06-13 (×17): qty 1

## 2017-06-13 NOTE — H&P (Signed)
Physical Medicine and Rehabilitation Admission H&P    Chief Complaint  Patient presents with  . Weakness   HPI: Shelley [name] is a 71 y.o. right handed female with history of hypertension, seizure disorder maintained on Keppra. Per chart review and patient, patient lives with spouse. Independent prior to admission. Presented 06/08/2017 with diplopia as well as fall. CT of the head reviewed, unremarkable for acute process. Patient did not receive TPA. MRI showed acute lacunar infarct in the left dorsal pons. Acute lacunar infarcts also the right cerebellum and right thalamus. MRA of head and neck unremarkable. Echocardiogram with ejection fraction of 76% grade 2 diastolic dysfunction. Maintained on Plavix for CVA prophylaxis. Subcutaneous Lovenox for DVT prophylaxis. She continues on Keppra for history of seizure disorder. Initial plan for TEE possible loop recorder however patient with burst of SVT returning back to sinus rhythm discussed with neurology services and plan was to discontinue TEE no loop recorder needed.  Physical and occupational therapy evaluations completed with recommendations of physical medicine rehabilitation consult. Patient was admitted for a comprehensive rehabilitation program  Review of Systems  Constitutional: Negative for fever.  HENT: Negative for hearing loss.   Eyes: Positive for double vision.  Respiratory: Negative for cough and shortness of breath.   Cardiovascular: Positive for leg swelling. Negative for chest pain and palpitations.  Gastrointestinal: Positive for constipation. Negative for nausea.  Genitourinary: Negative for dysuria, flank pain and hematuria.  Musculoskeletal: Positive for joint pain and myalgias.  Skin: Negative for rash.  Neurological: Positive for seizures.  Psychiatric/Behavioral:       Anxiety  All other systems reviewed and are negative.  Past Medical History:  Diagnosis Date  . Alcohol abuse    drinks awhile   . Anxiety     . CVA (cerebral infarction)    pt denies  . Hypertension   . Osteoporosis   . Seizures ([place])    Past Surgical History:  Procedure Laterality Date  . ABDOMINAL HYSTERECTOMY    . BREAST LUMPECTOMY    . COLONOSCOPY WITH PROPOFOL  06/04/2012   Procedure: COLONOSCOPY WITH PROPOFOL;  Surgeon: Danie Binder, MD;  Location: [facility];  Service: Endoscopy;  Laterality: N/A;  entered cecum @ 0752; total cecal withdrawal time = 13 minutes  . EMPYEMA DRAINAGE    . FLEXIBLE SIGMOIDOSCOPY  03/14/99   internal hemorrhoids  . OOPHORECTOMY    . POLYPECTOMY  06/04/2012   Procedure: POLYPECTOMY;  Surgeon: Danie Binder, MD;  Location: [facility];  Service: Endoscopy;  Laterality: N/A;  descending colon polyps x2; rectal polyp x1   Family History  Problem Relation Age of Onset  . Colon cancer Neg Hx    Social History:  reports that she has been smoking cigarettes.  She has been smoking about 0.50 packs per day. she has never used smokeless tobacco. She reports that she does not drink alcohol or use drugs. Allergies:  Allergies  Allergen Reactions  . Acyclovir And Related    Medications Prior to Admission  Medication Sig Dispense Refill  . aspirin 81 MG tablet Take 81 mg by mouth daily.    Marland Kitchen levETIRAcetam (KEPPRA) 500 MG tablet Take 500 mg by mouth 2 (two) times daily.    Marland Kitchen lisinopril (PRINIVIL,ZESTRIL) 10 MG tablet Take 10 mg by mouth daily.      Drug Regimen Review Drug regimen was reviewed and remains appropriate with no significant issues identified  Home: Home Living Family/patient expects to be discharged to:: Private  residence Living Arrangements: Spouse/significant other Available Help at Discharge: Family, Available 24 hours/day Type of Home: House  Lives With: Spouse   Functional History: Prior Function Level of Independence: Independent Comments: Reports that she performs ADLs and IADlLs  Functional Status:  Mobility: Bed Mobility Overal bed mobility: Needs Assistance Bed  Mobility: Supine to Sit Rolling: Min guard(with rail) Sidelying to sit: Min guard(with rail) Supine to sit: Min guard, HOB elevated Sit to supine: Min guard, HOB elevated General bed mobility comments: increased time and effort, min guard for safety  Transfers Overall transfer level: Needs assistance Equipment used: Rolling walker (2 wheeled) Transfers: Sit to/from Stand, [employer] Transfers Sit to Stand: Min assist Stand pivot transfers: Min assist General transfer comment: Assist to power to standing with cues for hand placement/technique. Cues for maintaining upright position; Pt completes stand pivot to recliner with MinA, verbal cues for technique using RW and advancing LEs, requires increased time; Pt fearful of falling  Ambulation/Gait Ambulation/Gait assistance: Min assist Ambulation Distance (Feet): 5 Feet Assistive device: Rolling walker (2 wheeled) Gait Pattern/deviations: Step-to pattern, Trunk flexed General Gait Details: Side stepped along side bed with Min A for balance using RW. Decreased foot clearance and bil knee flexion noted.  Gait velocity: decreased Gait velocity interpretation: <1.8 ft/sec, indicative of risk for recurrent falls    ADL: ADL Overall ADL's : Needs assistance/impaired Eating/Feeding: Set up, Sitting Grooming: Set up, Sitting Grooming Details (indicate cue type and reason): supported sitting; Pt applying lotion to LEs (while LEs elevated in recliner) Upper Body Bathing: Minimal assistance, Sitting Lower Body Bathing: Moderate assistance, Sit to/from stand Upper Body Dressing : Minimal assistance, Sitting Lower Body Dressing: Moderate assistance, Sit to/from stand Toileting- Water quality scientist and Hygiene: Maximal assistance, Sit to/from stand Toileting - Clothing Manipulation Details (indicate cue type and reason): Pt incontinent and unaware of having BM while in bed; required MaxA to complete pericare standing at RW prior to transfer to  recliner  Functional mobility during ADLs: Minimal assistance, Rolling walker, Cueing for sequencing, Cueing for safety General ADL Comments: Pt requires assist for peri-care after incontinent BM in bed; Pt completed bed mobility and stood at RW approx 3 min with minGuard for safety during pericare; Pt completed stand pivot to recliner with MinA, increased time and verbal cues for advancing LEs during transfer; Pt very fearful of falling   Cognition: Cognition Overall Cognitive Status: Impaired/Different from baseline Arousal/Alertness: Awake/alert Orientation Level: Oriented X4 Attention: Sustained Sustained Attention: Appears intact Memory: Impaired Memory Impairment: Storage deficit, Retrieval deficit(severe range on Cognistat) Awareness: Impaired Awareness Impairment: Intellectual impairment Problem Solving: Impaired Problem Solving Impairment: Verbal basic Safety/Judgment: Appears intact Cognition Arousal/Alertness: Awake/alert Behavior During Therapy: WFL for tasks assessed/performed Overall Cognitive Status: Impaired/Different from baseline Area of Impairment: Awareness, Memory, Problem solving, Following commands, Safety/judgement Current Attention Level: Sustained Memory: Decreased short-term memory Following Commands: Follows one step commands with increased time Safety/Judgement: Decreased awareness of safety Awareness: Intellectual Problem Solving: Slow processing, Requires verbal cues General Comments: Pt unaware of incontinent BM; repeating questions though is intermittently able to recognize she is repeating questions; initially not able to state where she is, when asked later during session Pt able to recall with min verbal cues   Physical Exam: Blood pressure (!) 159/90, pulse 66, temperature 98 F (36.7 C), temperature source Oral, resp. rate 18, height 5\' 5"  (1.651 m), weight 74 kg (163 lb 2.2 oz), SpO2 95 %. Physical Exam  Vitals reviewed. Constitutional: She  appears well-developed.  Frail  HENT:  Head: Normocephalic and atraumatic.  Eyes: EOM are normal. Right eye exhibits no discharge. Left eye exhibits no discharge.  Neck: Normal range of motion. Neck supple. No thyromegaly present.  Cardiovascular: Normal rate, regular rhythm and normal heart sounds.  Respiratory: Effort normal and breath sounds normal. No respiratory distress.  GI: Soft. Bowel sounds are normal. She exhibits no distension.  Musculoskeletal: She exhibits no edema or tenderness.  Neurological: She is alert.  Follows commands. Fair awareness of deficits Motor: 4+/5 throughout No ataxia B/l UE   Skin: Skin is warm and dry.  Psychiatric: She has a normal mood and affect. Her behavior is normal.    Medical Problem List and Plan: 1.  Diplopia with decreased functional mobility  secondary to acute lacunar infarct in the left dorsal pons and right cerebellum as well as right thalamus  2.  DVT Prophylaxis/Anticoagulation: Subcutaneous Lovenox. Monitor platelet counts in any signs of bleeding  3. Pain Management: Tylenol as needed  4. Mood: Provide emotional support  5. Neuropsych: This patient is  capable of making decisions on his  own behalf. 6. Skin/Wound Care: Routine skin checks  7. Fluids/Electrolytes/Nutrition: Routine I&O's with follow-up chemistries 8. Seizure disorder. Keppra 500 mg twice a day 9. Hypertension. Lopressor 25 mg twice a day, lisinopril 10 mg daily. Monitor with increased mobility  Post Admission Physician Evaluation: 1. Preadmission assessment reviewed and changes made below. 2. Functional deficits secondary  to acute lacunar infarct in the left dorsal pons and right cerebellum as well as right thalamus. 3. Patient is admitted to receive collaborative, interdisciplinary care between the physiatrist, rehab nursing staff, and therapy team. 4. Patient's level of medical complexity and substantial therapy needs in context of that medical necessity cannot  be provided at a lesser intensity of care such as a SNF. 5. Patient has experienced substantial functional loss from his/her baseline which was documented above under the "Functional History" and "Functional Status" headings.  Judging by the patient's diagnosis, physical exam, and functional history, the patient has potential for functional progress which will result in measurable gains while on inpatient rehab.  These gains will be of substantial and practical use upon discharge  in facilitating mobility and self-care at the household level. 32. Physiatrist will provide 24 hour management of medical needs as well as oversight of the therapy plan/treatment and provide guidance as appropriate regarding the interaction of the two. 7. 24 hour rehab nursing will assist with safety, disease management and patient education  and help integrate therapy concepts, techniques,education, etc. 8. PT will assess and treat for/with: Lower extremity strength, range of motion, stamina, balance, functional mobility, safety, adaptive techniques and equipment, coping skills, pain control, stroke education.   Goals are: Supervision/Min A. 9. OT will assess and treat for/with: ADL's, functional mobility, safety, upper extremity strength, adaptive techniques and equipment, ego support, and community reintegration.   Goals are: Supervision/Min A. Therapy may proceed with showering this patient. 10. Case Management and Social Worker will assess and treat for psychological issues and discharge planning. 11. Team conference will be held weekly to assess progress toward goals and to determine barriers to discharge. 12. Patient will receive at least 3 hours of therapy per day at least 5 days per week. 13. ELOS: 13-17 days.       14. Prognosis:  good  Delice Lesch, MD, ABPMR Lavon Paganini Angiulli, PA-C 06/13/2017

## 2017-06-13 NOTE — Progress Notes (Signed)
Physical Therapy Treatment Patient Details Name: Shelley [name] MRN: [id number] DOB: 1945/09/18 Today's Date: 06/13/2017    History of Present Illness Patient reports waking up on 12 7.18 morning  with diplopia.  Her husband noted that her left eye was deviated upward and lateral.  Patient also appeared to have some difficulty walking.  Her symptoms did not improve today, therefore the patient presented to the [facility] for [facility]. MRI showed acute infarcts Lt dorsal pons, Rt cerebellar and thalamus    PT Comments    Patient continues to be confused repeating questions and not understanding why she cannot go home. Requires encouragement for mobility. Very slow to perform all mobility requiring cues. Pt with decreased initiation, sequencing and weakness noted. Tolerated gait training with Mod A for balance/safety and declined using RW for support but prefers HHA and furniture walking as I found out pt was doing this PTA per spouse. Pt with decreased awareness of situation and impairments. Per spouse, pt not at baseline cognitively or with mobility. Plans to d/c to CIR today. Will follow.   Follow Up Recommendations  CIR     Equipment Recommendations  None recommended by PT    Recommendations for Other Services       Precautions / Restrictions Precautions Precautions: Fall Restrictions Weight Bearing Restrictions: No    Mobility  Bed Mobility Overal bed mobility: Needs Assistance Bed Mobility: Supine to Sit     Supine to sit: Min assist;HOB elevated     General bed mobility comments: increased time and effort, min A to elevate trunk to get to EOB.  Transfers Overall transfer level: Needs assistance Equipment used: Rolling walker (2 wheeled) Transfers: Sit to/from Stand Sit to Stand: Min assist         General transfer comment: Assist to power to standing with cues for hand placement/technique. Stood from EOB x2, forward flexed posture, cues needed for  upright. + dizziness initially.  Ambulation/Gait Ambulation/Gait assistance: Mod assist Ambulation Distance (Feet): 6 Feet Assistive device: 1 person hand held assist Gait Pattern/deviations: Step-to pattern;Trunk flexed;Narrow base of support;Decreased stride length;Decreased step length - left;Decreased step length - right Gait velocity: decreased   General Gait Details: Pt not wanting to use RW, used HH assist and holding onto counter (which spouse reports she is doing at home PTA); very slow to step, difficulty with sequencing. Flexed trunk.    Stairs            Wheelchair Mobility    Modified Rankin (Stroke Patients Only) Modified Rankin (Stroke Patients Only) Pre-Morbid Rankin Score: No symptoms Modified Rankin: Moderately severe disability     Balance Overall balance assessment: Needs assistance Sitting-balance support: Feet supported;Single extremity supported Sitting balance-Leahy Scale: Fair Sitting balance - Comments: static sitting EOB; slight posterior bias    Standing balance support: During functional activity;Bilateral upper extremity supported Standing balance-Leahy Scale: Poor Standing balance comment: Requires BUE support in standing, external support needed today due to bil knee instability; leaning on RW for support                            Cognition Arousal/Alertness: Awake/alert Behavior During Therapy: WFL for tasks assessed/performed Overall Cognitive Status: Impaired/Different from baseline Area of Impairment: Awareness;Memory;Problem solving;Following commands;Safety/judgement                 Orientation Level: Disoriented to;Situation Current Attention Level: Sustained Memory: Decreased short-term memory Following Commands: Follows one step commands with  increased time(repetition and multimodal cues) Safety/Judgement: Decreased awareness of safety Awareness: Intellectual Problem Solving: Slow processing;Requires verbal  cues;Decreased initiation;Difficulty sequencing General Comments: Pt unaware of incontinent BM; repeating questions thoughout. POor awareness of condition/impairments, wants to go home. Decreased initiation for all mobility.       Exercises      General Comments General comments (skin integrity, edema, etc.): HR in 50s bpm      Pertinent Vitals/Pain Pain Assessment: Faces Faces Pain Scale: No hurt    Home Living                      Prior Function            PT Goals (current goals can now be found in the care plan section) Progress towards PT goals: Progressing toward goals    Frequency    Min 4X/week      PT Plan Current plan remains appropriate    Co-evaluation              AM-PAC PT "6 Clicks" Daily Activity  Outcome Measure  Difficulty turning over in bed (including adjusting bedclothes, sheets and blankets)?: None Difficulty moving from lying on back to sitting on the side of the bed? : A Little Difficulty sitting down on and standing up from a chair with arms (e.g., wheelchair, bedside commode, etc,.)?: Unable Help needed moving to and from a bed to chair (including a wheelchair)?: A Little Help needed walking in hospital room?: A Lot Help needed climbing 3-5 steps with a railing? : Total 6 Click Score: 14    End of Session Equipment Utilized During Treatment: Gait belt Activity Tolerance: Patient limited by fatigue Patient left: in chair;with call bell/phone within reach;with family/visitor present(no chair alarm pads) Nurse Communication: Mobility status PT Visit Diagnosis: Unsteadiness on feet (R26.81);Other abnormalities of gait and mobility (R26.89);Muscle weakness (generalized) (M62.81);History of falling (Z91.81)     Time: 8527-7824 PT Time Calculation (min) (ACUTE ONLY): 24 min  Charges:  $Gait Training: 8-22 mins $Therapeutic Activity: 8-22 mins                    G Codes:       Wray Kearns, PT,  DPT [phone]     Marguarite Arbour A Winefred Hillesheim 06/13/2017, 3:03 PM

## 2017-06-13 NOTE — Progress Notes (Signed)
Report given to receiving nurse in CIR.  Patient anxious for discharge and states is ready to meet goals outlined by re-hab team.  Patient transferred to room 4W17 via wheelchair and introduced to staff.

## 2017-06-13 NOTE — Discharge Summary (Signed)
Physician Discharge Summary  Shelley [name] TDV:[id number] DOB: [birth year] DOA: 06/08/2017  PCP: Shelley Noble, MD  Admit date: 06/08/2017 Discharge date: 06/13/2017  Admitted From: home  Disposition:  SNF   Recommendations for Outpatient Follow-up:  1. F/u on BP control 2. F/u on K+ which has been replaced  Discharge Condition:  stable   CODE STATUS:  Full code   Consultations:  Neuro     Discharge Diagnoses:  Principal Problem:   Acute lacunar stroke [name]) Active Problems:   Hypertension   Anxiety   Seizures ([place])   Benign essential HTN   Diastolic dysfunction   Hypokalemia    Subjective: Feels that she still has occasional diplopia and blurred vision. No other complaints.   HPI:  71 y.o.femaleCaucasian with past medical history of anxiety, hypertension, seizures who is being transferred from [facility] for stroke workup.  Patient reports waking upon 12/ 7/18 with diplopia. Her husband noted that her left eye was deviated upward and lateral. Patient also appeared to have some difficulty walking. Her symptoms did not improve today, [facility] showed multiple infarcts and patient was transported to [facility] course further stroke workup.    [name] Course:   CVA: CT head: Mild diffuse cortical atrophy. Mild chronic ischemic white matter disease. No acute intracranial abnormality seen. MRI brain:  1. Acute lacunar infarct in the left dorsal pons, likely affecting the left medial longitudinal fasciculus (MLF) in this clinical setting. 2. Acute lacunar infarcts also in the right cerebellum and right Thalamus. 2  D ECHO:; no thrombus noted - neuro does not feel these are embolic infarcts and states that "patient's has multiple posterior cerebral circulation strokes but they all lacunar infarct from small vessel disease" - no TEE needed - LDL 101 - per neuro, change ASA to  Plavix daily - I will add as statin as well  H/o seizure disorder - cont Keppra  HTN - cont Metoprolol- see below  Grade 2 d CHF - Noted on ECHO - see below - needs better BP control - cannot increase Metoprolol as HR in 50s- 60s - will add Norvasc 10 mg daily  Hypokalemia - replaced   Discharge Instructions   Allergies as of 06/13/2017      Reactions   Acyclovir And Related       Medication List    STOP taking these medications   aspirin 81 MG tablet     TAKE these medications   amLODipine 10 MG tablet Commonly known as:  NORVASC Take 1 tablet (10 mg total) by mouth daily.   atorvastatin 20 MG tablet Commonly known as:  LIPITOR Take 1 tablet (20 mg total) by mouth daily.   clopidogrel 75 MG tablet Commonly known as:  PLAVIX Take 1 tablet (75 mg total) by mouth daily. Start taking on:  06/14/2017   levETIRAcetam 500 MG tablet Commonly known as:  KEPPRA Take 500 mg by mouth 2 (two) times daily.   lisinopril 10 MG tablet Commonly known as:  PRINIVIL,ZESTRIL Take 10 mg by mouth daily.   magnesium oxide 400 MG tablet Commonly known as:  MAG-OX Take 1 tablet (400 mg total) by mouth 2 (two) times daily.   metoprolol tartrate 25 MG tablet Commonly known as:  LOPRESSOR Take 1 tablet (25 mg total) by mouth 2 (two) times daily.      Follow-up Information    Shelley Noble, MD Follow up in 2 week(s).   Specialty:  Internal Medicine Contact information: [address] [place] [address] [phone]          Allergies  Allergen Reactions  . Acyclovir And Related      Procedures/Studies: 2 D ECHO Left ventricle: The cavity size was normal. Wall thickness was   increased in a pattern of mild LVH. Systolic function was normal.   The estimated ejection fraction was in the range of 55% to 60%.   Features are consistent with a pseudonormal left ventricular   filling pattern, with concomitant abnormal relaxation and   increased filling  pressure (grade 2 diastolic dysfunction). - Right atrium: The atrium was mildly dilated. - Atrial septum: No defect or patent foramen ovale was identified.  Dg Chest 1 View  Result Date: 06/08/2017 CLINICAL DATA:  Right-sided weakness. EXAM: CHEST 1 VIEW COMPARISON:  06/14/2010.  03/11/2004.  CT 10/16/2001. FINDINGS: Mediastinum and hilar structures normal. Cardiomegaly with normal pulmonary vascularity. Density is noted right upper lobe. Although this could represent focal mild infiltrate, focal mass lesion cannot be excluded. Follow-up PA and lateral chest x-ray suggested. This density does not clear nonenhanced chest CT suggested. Mild cardiomegaly. No pulmonary venous congestion. Small bilateral pleural effusions. No pneumothorax. Degenerative changes scoliosis thoracic spine . IMPRESSION: 1. Small ill-defined density is in the right upper lobe. Although this could represent a focal infiltrate, focal mass lesion cannot be excluded . Close follow-up PA lateral chest x-ray suggested. This density does not clear nonenhanced chest CT suggested . 2. Small bilateral pleural effusions. 3. Mild cardiomegaly.  No pulmonary venous congestion. Electronically Signed   By: Marcello Moores  Register   On: 06/08/2017 14:58   Ct Head Wo Contrast  Result Date: 06/08/2017 CLINICAL DATA:  Focal neural deficit. EXAM: CT HEAD WITHOUT CONTRAST TECHNIQUE: Contiguous axial images were obtained from the base of the skull through the vertex without intravenous contrast. COMPARISON:  CT scan of June 14, 2010. FINDINGS: Brain: Mild diffuse cortical atrophy is noted. Mild chronic ischemic white matter disease is noted. Old bilateral alignment lacunar infarctions are noted. No mass effect or midline shift is noted. Ventricular size is within normal limits. There is no evidence of mass lesion, hemorrhage or acute infarction. Vascular: No hyperdense vessel or unexpected calcification. Skull: Normal. Negative for fracture or focal lesion.  Sinuses/Orbits: Mild right ethmoid sinusitis is noted. Other: None. IMPRESSION: Mild diffuse cortical atrophy. Mild chronic ischemic white matter disease. No acute intracranial abnormality seen. Electronically Signed   By: Marijo Conception, M.D.   On: 06/08/2017 15:06   Mr Jodene Nam Neck W Wo Contrast  Result Date: 06/10/2017 CLINICAL DATA:  71 y/o  F; stroke follow-up evaluation. EXAM: MRA NECK WITHOUT AND WITH CONTRAST MRA NECK WITHOUT AND WITH CONTRAST TECHNIQUE: Multiplanar and multiecho pulse sequences of the neck were obtained without and with intravenous contrast. Angiographic images of the neck were obtained using MRA technique without and with intravenous contrast. Angiographic images of the head were obtained using MRA technique without intravenous contrast. CONTRAST:  34mL MULTIHANCE GADOBENATE DIMEGLUMINE 529 MG/ML IV SOLN COMPARISON:  06/08/2017 MRI of the head FINDINGS: MRA neck: Aortic arch: Patent. Right common carotid artery: Patent. Right internal carotid artery: Patent. Right vertebral artery: Patent. Left common carotid artery: Patent. Left Internal carotid artery: Patent. Left Vertebral artery: Patent. There is no evidence of hemodynamically significant stenosis by NASCET criteria, occlusion, or aneurysm. MRA head: Internal carotid arteries:  Patent. Anterior cerebral arteries:  Patent. Middle cerebral arteries: Patent. Anterior communicating artery: Patent. Posterior communicating arteries:  Fetal left PCA. No right posterior communicating artery identified, likely hypoplastic or absent. Posterior cerebral arteries:  Patent. Basilar artery:  Patent. Vertebral arteries:  Patent. No evidence of high-grade stenosis, large vessel occlusion, or aneurysm. IMPRESSION: 1. Normal MRA of the neck. 2. Normal MRA of the head. Electronically Signed   By: Kristine Garbe M.D.   On: 06/10/2017 01:39   Mr Brain Wo Contrast (neuro Protocol)  Result Date: 06/08/2017 CLINICAL DATA:  71 year old female  with right-sided weakness since yesterday morning. Ophthalmoplegia. EXAM: MRI HEAD WITHOUT CONTRAST TECHNIQUE: Multiplanar, multiecho pulse sequences of the brain and surrounding structures were obtained without intravenous contrast. COMPARISON:  Head CT without contrast 1448 hours today. Brain MRI 06/15/2010. FINDINGS: Brain: Cerebral volume loss since 2011 appears generalized. Progressed chronic small vessel disease with increased size and number of chronic lacunar infarcts in the bilateral deep gray matter nuclei and pons. Chronically advanced bilateral cerebral white matter T2 and FLAIR hyperintensity is also more patchy and confluent. No definite chronic cerebral blood products or cortical encephalomalacia identified. There is a linear 14 mm area of restricted diffusion in the central right cerebellum with faint FLAIR hyperintensity. No hemorrhage or mass effect. There is a second punctate area of restricted diffusion along the ventral, superior left aspect of the fourth ventricle (series 3, image 76 and series 5, image 52. Associated T2 and FLAIR hyperintensity with no hemorrhage or mass effect. There is also a punctate area of restricted diffusion in the central right thalamus (series 5, image 53). No hemorrhage or mass effect. This is in an area of patchy T2 and FLAIR hyperintensity. No other restricted diffusion. No midline shift, mass effect, evidence of mass lesion, ventriculomegaly, extra-axial collection or acute intracranial hemorrhage. Cervicomedullary junction and pituitary are within normal limits. Vascular: Major intracranial vascular flow voids are stable since 2011. Skull and upper cervical spine: Negative visualized cervical spine. Normal bone marrow signal. Sinuses/Orbits: Leftward gaze deviation. Otherwise normal orbits soft tissues. Chronic mild mastoid effusions and paranasal sinus mucosal thickening is stable since 20/11. Other: Scalp and orbits soft tissues appear negative. IMPRESSION: 1.  Acute lacunar infarct in the left dorsal pons, likely affecting the left medial longitudinal fasciculus (MLF) in this clinical setting. 2. Acute lacunar infarcts also in the right cerebellum and right thalamus. 3. No intracranial hemorrhage or mass effect. 4. 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 Electronically Signed   By: Genevie Ann M.D.   On: 06/08/2017 16:29   Mr Jodene Nam Head/brain SA Cm  Result Date: 06/10/2017 CLINICAL DATA:  71 y/o  F; stroke follow-up evaluation. EXAM: MRA NECK WITHOUT AND WITH CONTRAST MRA NECK WITHOUT AND WITH CONTRAST TECHNIQUE: Multiplanar and multiecho pulse sequences of the neck were obtained without and with intravenous contrast. Angiographic images of the neck were obtained using MRA technique without and with intravenous contrast. Angiographic images of the head were obtained using MRA technique without intravenous contrast. CONTRAST:  51mL MULTIHANCE GADOBENATE DIMEGLUMINE 529 MG/ML IV SOLN COMPARISON:  06/08/2017 MRI of the head FINDINGS: MRA neck: Aortic arch: Patent. Right common carotid artery: Patent. Right internal carotid artery: Patent. Right vertebral artery: Patent. Left common carotid artery: Patent. Left Internal carotid artery: Patent. Left Vertebral artery: Patent. There is no evidence of hemodynamically significant stenosis by NASCET criteria, occlusion, or aneurysm. MRA head: Internal carotid arteries:  Patent. Anterior cerebral arteries:  Patent. Middle cerebral arteries: Patent. Anterior communicating artery: Patent. Posterior communicating arteries: Fetal left PCA. No right posterior communicating artery identified,  likely hypoplastic or absent. Posterior cerebral arteries:  Patent. Basilar artery:  Patent. Vertebral arteries:  Patent. No evidence of high-grade stenosis, large vessel occlusion, or aneurysm. IMPRESSION: 1. Normal MRA of the neck. 2. Normal MRA of the head. Electronically Signed   By: Kristine Garbe M.D.   On: 06/10/2017 01:39       Discharge Exam: Vitals:   06/13/17 0607 06/13/17 1015  BP: (!) 159/90 (!) 162/82  Pulse: 66 69  Resp: 18 17  Temp: 98 F (36.7 C) 97.7 F (36.5 C)  SpO2: 95% 94%   Vitals:   06/12/17 2039 06/13/17 0051 06/13/17 0607 06/13/17 1015  BP: (!) 148/94 (!) 158/92 (!) 159/90 (!) 162/82  Pulse: 63 72 66 69  Resp: 18 18 18 17   Temp: 98.2 F (36.8 C) 98.1 F (36.7 C) 98 F (36.7 C) 97.7 F (36.5 C)  TempSrc: Oral Oral Oral Axillary  SpO2: 97% 98% 95% 94%  Weight:      Height:        General: Pt is alert, awake, not in acute distress Cardiovascular: RRR, S1/S2 +, no rubs, no gallops Respiratory: CTA bilaterally, no wheezing, no rhonchi Abdominal: Soft, NT, ND, bowel sounds + Extremities: no edema, no cyanosis    The results of significant diagnostics from this hospitalization (including imaging, microbiology, ancillary and laboratory) are listed below for reference.     Microbiology: No results found for this or any previous visit (from the past 240 hour(s)).   Labs: BNP (last 3 results) No results for input(s): BNP in the last 8760 hours. Basic Metabolic Panel: Recent Labs  Lab 06/08/17 1412 06/11/17 0416  NA 136 138  K 3.3* 3.3*  CL 102 104  CO2 24 23  GLUCOSE 97 87  BUN 13 12  CREATININE 0.66 0.72  CALCIUM 9.0 8.8*   Liver Function Tests: Recent Labs  Lab 06/08/17 1412  AST 20  ALT 11*  ALKPHOS 109  BILITOT 0.7  PROT 6.6  ALBUMIN 3.8   No results for input(s): LIPASE, AMYLASE in the last 168 hours. No results for input(s): AMMONIA in the last 168 hours. CBC: Recent Labs  Lab 06/08/17 1412  WBC 7.7  NEUTROABS 5.6  HGB 15.8*  HCT 49.8*  MCV 96.1  PLT 185   Cardiac Enzymes: Recent Labs  Lab 06/08/17 1412  TROPONINI <0.03   BNP: Invalid input(s): POCBNP CBG: No results for input(s): GLUCAP in the last 168 hours. D-Dimer No results for input(s): DDIMER in the last 72 hours. Hgb  A1c No results for input(s): HGBA1C in the last 72 hours. Lipid Profile No results for input(s): CHOL, HDL, LDLCALC, TRIG, CHOLHDL, LDLDIRECT in the last 72 hours. Thyroid function studies No results for input(s): TSH, T4TOTAL, T3FREE, THYROIDAB in the last 72 hours.  Invalid input(s): FREET3 Anemia work up No results for input(s): VITAMINB12, FOLATE, FERRITIN, TIBC, IRON, RETICCTPCT in the last 72 hours. Urinalysis    Component Value Date/Time   COLORURINE YELLOW 06/08/2017 2055   APPEARANCEUR HAZY (A) 06/08/2017 2055   LABSPEC 1.014 06/08/2017 2055   PHURINE 7.0 06/08/2017 2055   GLUCOSEU NEGATIVE 06/08/2017 2055   HGBUR NEGATIVE 06/08/2017 2055   BILIRUBINUR NEGATIVE 06/08/2017 2055   KETONESUR 20 (A) 06/08/2017 2055   PROTEINUR NEGATIVE 06/08/2017 2055   UROBILINOGEN 0.2 06/14/2010 0741   NITRITE NEGATIVE 06/08/2017 2055   LEUKOCYTESUR NEGATIVE 06/08/2017 2055   Sepsis Labs Invalid input(s): PROCALCITONIN,  WBC,  LACTICIDVEN Microbiology No results found for this or  any previous visit (from the past 240 hour(s)).   Time coordinating discharge: Over 30 minutes  SIGNED:   Debbe Odea, MD  [facility] 06/13/2017, 3:16 PM Pager   If 7PM-7AM, please contact night-coverage [email or web address] Password [email or web address]

## 2017-06-13 NOTE — Progress Notes (Signed)
re3port taken from [place], to go to room [address] 17

## 2017-06-13 NOTE — Plan of Care (Signed)
  Clinical Measurements: Diagnostic test results will improve 06/13/2017 0544 - Progressing by Rianna Lukes, Roma Kayser, RN

## 2017-06-13 NOTE — PMR Pre-admission (Signed)
PMR Admission Coordinator Pre-Admission Assessment  Patient: Shelley [name] is an 71 y.o., female MRN: [id number] DOB: [birth year] Height: 5\' 5"  (165.1 cm) Weight: 74 kg (163 lb 2.2 oz)              Insurance Information HMO: X    PPO:      PCP:      IPA:      80/20:      OTHER:  PRIMARY: [employer] Medicare       Policy#: [id number]      Subscriber: Self CM Name: Shelley [name]       Phone#: [phone]     Fax#: [phone] Pre-Cert#: [id number]      Employer: Retired Benefits:  Phone #: [phone]     Name: Verified online [email or web address] Eff. Date: 07/03/16     Deduct: $0      Out of Pocket Max: [phone]      Life Max: N/A CIR: $430 a day, days 1-4      SNF: $0 a day, days 1-20; $160 a day, days 21-60; $0 a day, days 63-100 Outpatient: PT/OT/SLP     Co-Pay: $40 per visit  Home Health: 100%      Co-Pay: $0 DME: 80%     Co-Pay: 20% Providers: In-network   SECONDARY: None      Policy#:       Subscriber:  CM Name:       Phone#:      Fax#:  Pre-Cert#:       Employer:  Benefits:  Phone #:      Name:  Eff. 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 Per chart reviewand patient,patient lives with spouse. Independent prior to admission. Presented 06/08/2017 with diplopia as well as fall. CT of the headreviewed, unremarkable for acute process.Patient  did not receive TPA. MRI showed acute lacunar infarct in the left dorsal pons. Acute lacunar infarcts also the right cerebellum and right thalamus. MRA of head and neck unremarkable. Echocardiogram with ejection fraction of 42% grade 2 diastolic dysfunction. Maintained on Plavix for CVA prophylaxis. Subcutaneous Lovenox for DVT prophylaxis. She continues on Keppra for history of seizure disorder. Initial plan for TEE possible loop recorder however patient with burst of SVT returning back to sinus rhythm discussed with neurology services and plan was to discontinue TEE no loop recorder needed. Physical and occupational therapy evaluations completed with recommendations of physical medicine rehabilitation consult. Patient was admitted for a comprehensive rehabilitation program 06/13/17.  NIH Total: 1    Past Medical History  Past Medical History:  Diagnosis Date  . Alcohol  abuse    drinks awhile   . Anxiety   . CVA (cerebral infarction)    pt denies  . Hypertension   . Osteoporosis   . Seizures ([place])     Family History  family history is not on file.  Prior Rehab/Hospitalizations:  Has the patient had major surgery during 100 days prior to admission? No  Current Medications   Current Facility-Administered Medications:  .  acetaminophen (TYLENOL) tablet 650 mg, 650 mg, Oral, Q4H PRN, 650 mg at 06/09/17 1143 **OR** acetaminophen (TYLENOL) solution 650 mg, 650 mg, Per Tube, Q4H PRN **OR** acetaminophen (TYLENOL) suppository 650 mg, 650 mg, Rectal, Q4H PRN, Stinson, Jacob J, DO .  albuterol (PROVENTIL) (2.5 MG/3ML) 0.083% nebulizer solution 2.5 mg, 2.5 mg, Nebulization, Q6H PRN, Georgette Shell, MD .  Derrill Memo ON 06/14/2017] clopidogrel (PLAVIX) tablet 75 mg, 75 mg, Oral, Daily, Rizwan, Saima, MD .  enoxaparin (LOVENOX) injection 40 mg, 40 mg, Subcutaneous, Q24H, Stinson, Jacob J, DO, 40 mg at 06/12/17 2252 .  hydrALAZINE (APRESOLINE) injection 10 mg, 10 mg, Intravenous, Q4H PRN,  Truett Mainland, DO, 10 mg at 06/08/17 2237 .  levETIRAcetam (KEPPRA) tablet 500 mg, 500 mg, Oral, BID, Truett Mainland, DO, 500 mg at 06/13/17 1157 .  metoprolol tartrate (LOPRESSOR) tablet 25 mg, 25 mg, Oral, BID, Georgette Shell, MD, 25 mg at 06/13/17 1157 .  senna-docusate (Senokot-S) tablet 1 tablet, 1 tablet, Oral, QHS PRN, Truett Mainland, DO, 1 tablet at 06/11/17 2157  Patients Current Diet: Diet Heart Room service appropriate? Yes; Fluid consistency: Thin  Precautions / Restrictions Precautions Precautions: Fall Restrictions Weight Bearing Restrictions: No   Has the patient had 2 or more falls or a fall with injury in the past year?No  Prior Activity Level Household: Prior to [employer] patient went out to medical appointments sometimes, spouse managed home tasks, but patient was fully independent with self-care tasks and able to walk around the house without his physical assist.    [place] Devices/Equipment: Environmental consultant (specify type)  Prior Device Use: Indicate devices/aids used by the patient prior to current illness, exacerbation or injury? Walker  Prior Functional Level Prior Function Level of Independence: Independent Comments: Reports that she performs ADLs and IADlLs  Self Care: Did the patient need help bathing, dressing, using the toilet or eating? Independent  Indoor Mobility: Did the patient need assistance with walking from room to room (with or without device)? Independent  Stairs: Did the patient need assistance with internal or external stairs (with or without device)? Independent  Functional Cognition: Did the patient need help planning regular tasks such as shopping or remembering to take medications? Needed some help  Current Functional Level Cognition  Arousal/Alertness: Awake/alert Overall Cognitive Status: Impaired/Different from baseline Current Attention Level: Sustained Orientation Level: Oriented  X4 Following Commands: Follows one step commands with increased time(repetition and multimodal cues) Safety/Judgement: Decreased awareness of safety General Comments: Pt unaware of incontinent BM; repeating questions thoughout. POor awareness of condition/impairments, wants to go home. Decreased initiation for all mobility.  Attention: Sustained Sustained Attention: Appears intact Memory: Impaired Memory Impairment: Storage deficit, Retrieval deficit(severe range on Cognistat) Awareness: Impaired Awareness Impairment: Intellectual impairment Problem Solving: Impaired Problem Solving Impairment: Verbal basic Safety/Judgment: Appears intact    Extremity Assessment (includes Sensation/Coordination)  Upper Extremity Assessment: Generalized weakness  Lower Extremity Assessment: Defer to PT evaluation LLE Coordination: decreased gross motor    ADLs  Overall ADL's : Needs assistance/impaired Eating/Feeding: Set up,  Sitting Grooming: Set up, Sitting Grooming Details (indicate cue type and reason): supported sitting; Pt applying lotion to LEs (while LEs elevated in recliner) Upper Body Bathing: Minimal assistance, Sitting Lower Body Bathing: Moderate assistance, Sit to/from stand Upper Body Dressing : Minimal assistance, Sitting Lower Body Dressing: Moderate assistance, Sit to/from stand Toileting- Water quality scientist and Hygiene: Maximal assistance, Sit to/from stand Toileting - Clothing Manipulation Details (indicate cue type and reason): Pt incontinent and unaware of having BM while in bed; required MaxA to complete pericare standing at RW prior to transfer to recliner  Functional mobility during ADLs: Minimal assistance, Rolling walker, Cueing for sequencing, Cueing for safety General ADL Comments: Pt requires assist for peri-care after incontinent BM in bed; Pt completed bed mobility and stood at RW approx 3 min with minGuard for safety during pericare; Pt completed stand pivot to  recliner with MinA, increased time and verbal cues for advancing LEs during transfer; Pt very fearful of falling     Mobility  Overal bed mobility: Needs Assistance Bed Mobility: Supine to Sit Rolling: Min guard(with rail) Sidelying to sit: Min guard(with rail) Supine to sit: Min assist, HOB elevated Sit to supine: Min guard, HOB elevated General bed mobility comments: increased time and effort, min A to elevate trunk to get to EOB.    Transfers  Overall transfer level: Needs assistance Equipment used: Rolling walker (2 wheeled) Transfers: Sit to/from Stand Sit to Stand: Min assist Stand pivot transfers: Min assist General transfer comment: Assist to power to standing with cues for hand placement/technique. Stood from EOB x2, forward flexed posture, cues needed for upright. + dizziness initially.    Ambulation / Gait / Stairs / Wheelchair Mobility  Ambulation/Gait Ambulation/Gait assistance: Mod assist Ambulation Distance (Feet): 6 Feet Assistive device: 1 person hand held assist Gait Pattern/deviations: Step-to pattern, Trunk flexed, Narrow base of support, Decreased stride length, Decreased step length - left, Decreased step length - right General Gait Details: Pt not wanting to use RW, used HH assist and holding onto counter (which spouse reports she is doing at home PTA); very slow to step, difficulty with sequencing. Flexed trunk.  Gait velocity: decreased Gait velocity interpretation: <1.8 ft/sec, indicative of risk for recurrent falls    Posture / Balance Dynamic Sitting Balance Sitting balance - Comments: static sitting EOB; slight posterior bias  Balance Overall balance assessment: Needs assistance Sitting-balance support: Feet supported, Single extremity supported Sitting balance-Leahy Scale: Fair Sitting balance - Comments: static sitting EOB; slight posterior bias  Postural control: Posterior lean Standing balance support: During functional activity, Bilateral upper  extremity supported Standing balance-Leahy Scale: Poor Standing balance comment: Requires BUE support in standing, external support needed today due to bil knee instability; leaning on RW for support    Special needs/care consideration BiPAP/CPAP: No CPM: No Continuous Drip IV: No Dialysis: No         Life Vest: No Oxygen: No Special Bed: No Trach Size: No Wound Vac (area): No       Skin: Dry, bruising to left arm                                Bowel mgmt: Incontinent, last BM 06/11/17 Bladder mgmt: Incontinent, per chart review with blood in urine  Diabetic mgmt: No, HgbA1c -  5.6         Previous Home Environment Living Arrangements: Spouse/significant other  Lives With: Spouse Available Help at Discharge: Family, Available  24 hours/day Type of Home: [place]: No  Discharge Living Setting Plans for Discharge Living Setting: Patient's home, Lives with (comment)(Spouse) Type of Home at Discharge: House Discharge Home Layout: One level Discharge Home Access: Stairs to enter Entrance Stairs-Rails: Can reach both Entrance Stairs-Number of Steps: 4-5 Discharge Bathroom Shower/Tub: Tub/shower unit, Curtain Discharge Bathroom Toilet: Standard Discharge Bathroom Accessibility: Yes How Accessible: Accessible via walker Does the patient have any problems obtaining your medications?: No  Social/Family/Support Systems Patient Roles: Spouse, Parent(grandparent ) Contact Information: Spouse: [place] Chard  Anticipated Caregiver: Spouse  Anticipated Caregiver's Contact Information: Cell: [phone] Ability/Limitations of Caregiver: He has back problems and provide Sueprvision assist  Caregiver Availability: 24/7 Discharge Plan Discussed with Primary Caregiver: Yes Is Caregiver In Agreement with Plan?: Yes Does Caregiver/Family have Issues with Lodging/Transportation while Pt is in Rehab?: No  Goals/Additional Needs Patient/Family Goal for Rehab: PT/OT:  Supervision; SLP: Mod I  Expected length of stay: 8-12 days  Cultural Considerations: Baptist  Dietary Needs: Heart Healthy  Equipment Needs: TBD Special Service Needs: None Pt/Family Agrees to Admission and willing to participate: Yes Program Orientation Provided & Reviewed with Pt/Caregiver Including Roles  & Responsibilities: Yes Additional Information Needs: Patient and spouse discussed her untreated depression, may benefit from Neuropsyh  Information Needs to be Provided By: Juluis Rainier for team   Barriers to Discharge: Incontinence, Other (comments)(Spouse can provide Sueprvision assist )  Decrease burden of Care through IP rehab admission: No  Possible need for SNF placement upon discharge: No  Patient Condition: This patient's medical and functional status has changed since the consult dated: 06/11/17 in which the Rehabilitation Physician determined and documented that the patient's condition is appropriate for intensive rehabilitative care in an inpatient rehabilitation facility. See "History of Present Illness" (above) for medical update. Functional changes are: Min assist transfers, Mod A gait. Patient's medical and functional status update has been discussed with the Rehabilitation physician and patient remains appropriate for inpatient rehabilitation. Will admit to inpatient rehab today.  Preadmission Screen Completed By:  Gunnar Fusi, 06/13/2017 3:25 PM ______________________________________________________________________   Discussed status with Dr. Posey Pronto on 06/13/17 at 1525 and received telephone approval for admission today.  Admission Coordinator:  Gunnar Fusi, time 1525/Date 06/13/17

## 2017-06-13 NOTE — Care Management Note (Signed)
Case Management Note  Patient Details  Name: Shelley [name] MRN: [id number] Date of Birth: [birth year]  Subjective/Objective:                    Action/Plan: Pt discharging to CIR today. No further needs per CM.  Expected Discharge Date:  06/13/17               Expected Discharge Plan:  [place]  In-House Referral:     Discharge planning Services  CM Consult  Post Acute Care Choice:    Choice offered to:     DME Arranged:    DME Agency:     HH Arranged:    HH Agency:     Status of Service:  Completed, signed off  If discussed at [employer], dates discussed:    Additional Comments:  Pollie Friar, RN 06/13/2017, 3:16 PM

## 2017-06-13 NOTE — Progress Notes (Signed)
Inpatient Rehabilitation  I have received insurance approval from [facility] and have a bed to offer patient today.  Plan to proceed with admission and have updated team.  Call if questions.   Carmelia Roller., CCC/SLP Admission Coordinator  [place]  Cell [phone]

## 2017-06-13 NOTE — Progress Notes (Signed)
To room 17 per chair, very weak and unsteady. Oriented to self and situation. Oriented to IP Rehab and policies. Family not in attendance

## 2017-06-14 ENCOUNTER — Inpatient Hospital Stay (HOSPITAL_COMMUNITY): Payer: Medicare Other | Admitting: Speech Pathology

## 2017-06-14 ENCOUNTER — Inpatient Hospital Stay (HOSPITAL_COMMUNITY): Payer: Medicare Other | Admitting: Occupational Therapy

## 2017-06-14 ENCOUNTER — Inpatient Hospital Stay (HOSPITAL_COMMUNITY): Payer: Medicare Other

## 2017-06-14 DIAGNOSIS — I6350 Cerebral infarction due to unspecified occlusion or stenosis of unspecified cerebral artery: Secondary | ICD-10-CM

## 2017-06-14 DIAGNOSIS — I69398 Other sequelae of cerebral infarction: Principal | ICD-10-CM

## 2017-06-14 DIAGNOSIS — R269 Unspecified abnormalities of gait and mobility: Secondary | ICD-10-CM

## 2017-06-14 DIAGNOSIS — Z87898 Personal history of other specified conditions: Secondary | ICD-10-CM

## 2017-06-14 LAB — CBC WITH DIFFERENTIAL/PLATELET
BASOS ABS: 0 10*3/uL (ref 0.0–0.1)
Basophils Relative: 0 %
EOS PCT: 1 %
Eosinophils Absolute: 0.1 10*3/uL (ref 0.0–0.7)
HEMATOCRIT: 47.3 % — AB (ref 36.0–46.0)
Hemoglobin: 14.7 g/dL (ref 12.0–15.0)
LYMPHS PCT: 16 %
Lymphs Abs: 1.3 10*3/uL (ref 0.7–4.0)
MCH: 29.6 pg (ref 26.0–34.0)
MCHC: 31.1 g/dL (ref 30.0–36.0)
MCV: 95.4 fL (ref 78.0–100.0)
Monocytes Absolute: 0.9 10*3/uL (ref 0.1–1.0)
Monocytes Relative: 11 %
NEUTROS ABS: 6.1 10*3/uL (ref 1.7–7.7)
NEUTROS PCT: 72 %
PLATELETS: 147 10*3/uL — AB (ref 150–400)
RBC: 4.96 MIL/uL (ref 3.87–5.11)
RDW: 14.7 % (ref 11.5–15.5)
WBC: 8.4 10*3/uL (ref 4.0–10.5)

## 2017-06-14 LAB — COMPREHENSIVE METABOLIC PANEL
ALT: 15 U/L (ref 14–54)
AST: 23 U/L (ref 15–41)
Albumin: 3.2 g/dL — ABNORMAL LOW (ref 3.5–5.0)
Alkaline Phosphatase: 94 U/L (ref 38–126)
Anion gap: 15 (ref 5–15)
BUN: 15 mg/dL (ref 6–20)
CHLORIDE: 104 mmol/L (ref 101–111)
CO2: 16 mmol/L — AB (ref 22–32)
CREATININE: 0.65 mg/dL (ref 0.44–1.00)
Calcium: 8.9 mg/dL (ref 8.9–10.3)
GFR calc Af Amer: >60 mL/min (ref >60)
Glucose, Bld: 95 mg/dL (ref 65–99)
Potassium: 4.6 mmol/L (ref 3.5–5.1)
Sodium: 135 mmol/L (ref 135–145)
Total Bilirubin: 2 mg/dL — ABNORMAL HIGH (ref 0.3–1.2)
Total Protein: 5.8 g/dL — ABNORMAL LOW (ref 6.5–8.1)

## 2017-06-14 MED ORDER — HYDROCERIN EX CREA
TOPICAL_CREAM | Freq: Two times a day (BID) | CUTANEOUS | Status: DC
  Administered 2017-06-14 – 2017-06-28 (×27): via TOPICAL
  Filled 2017-06-14 (×2): qty 113

## 2017-06-14 NOTE — Progress Notes (Signed)
PMR Admission Coordinator Pre-Admission Assessment  Patient: Shelley [name] is an 71 y.o., female MRN: [id number] DOB: Jun 18, 1946 Height: 5\' 5"  (165.1 cm) Weight: 74 kg (163 lb 2.2 oz)                                                                                                                                                  Insurance Information HMO: X    PPO:      PCP:      IPA:      80/20:      OTHER:  PRIMARY: U[employer] Medicare       Policy#: [id number]      Subscriber: Self CM Name: Vevelyn Royals       Phone#: [phone]     Fax#: [phone] Pre-Cert#: [id number]      Employer: Retired Benefits:  Phone #: [phone]     Name: Verified online [email or web address] Eff. Date: 07/03/16     Deduct: $0      Out of Pocket Max: [phone]      Life Max: N/A CIR: $430 a day, days 1-4      SNF: $0 a day, days 1-20; $160 a day, days 21-60; $0 a day, days 63-100 Outpatient: PT/OT/SLP     Co-Pay: $40 per visit  Home Health: 100%      Co-Pay: $0 DME: 80%     Co-Pay: 20% Providers: In-network   SECONDARY: None      Policy#:       Subscriber:  CM Name:       Phone#:      Fax#:  Pre-Cert#:       Employer:  Benefits:  Phone #:      Name:  Eff. Date:      Deduct:       Out of Pocket Max:       Life Max:  CIR:       SNF:  Outpatient:      Co-Pay:  Home Health:       Co-Pay:  DME:      Co-Pay:   Medicaid Application Date:       Case Manager:  Disability Application Date:       Case Worker:   Emergency Contact Information        Contact Information    Name Relation Home Work Shelley [name] [phone]  [phone]   No name specified         Current Medical History  Patient Admitting Diagnosis: Acute lacunar infarct in the left dorsal ponsandright cerebellum and right thalamus  History of Present Illness: Shelley Beggs Wilsonis a 71 y.o.right handed femalewith history of hypertension, seizure disorder maintained on Keppra. Per chart reviewand patient,patient lives with  spouse. Independent prior to admission. Presented 06/08/2017 with diplopia as well as fall. CT of the headreviewed, unremarkable  for acute process.Patient did not receive TPA. MRI showed acute lacunar infarct in the left dorsal pons. Acute lacunar infarcts also the right cerebellum and right thalamus. MRA of head and neck unremarkable. Echocardiogram with ejection fraction of 78% grade 2 diastolic dysfunction. Maintainedon Plavix for CVA prophylaxis. Subcutaneous Lovenox for DVT prophylaxis.She continues on Keppra for history of seizure disorder.Initial plan for TEE possible loop recorder however patient with burst of SVT returning back to sinus rhythm discussed with neurology services and plan was to discontinue TEEno loop recorder needed. Physicaland occupationaltherapy evaluationscompleted with recommendations of physical medicine rehabilitation consult.Patient was admitted for a comprehensive rehabilitation program 06/13/17.  NIH Total: 1  Past Medical History      Past Medical History:  Diagnosis Date  . Alcohol abuse    drinks awhile   . Anxiety   . CVA (cerebral infarction)    pt denies  . Hypertension   . Osteoporosis   . Seizures (Shelley [name])     Family History  family history is not on file.  Prior Rehab/Hospitalizations:  Has the patient had major surgery during 100 days prior to admission? No  Current Medications   Current Facility-Administered Medications:  .  acetaminophen (TYLENOL) tablet 650 mg, 650 mg, Oral, Q4H PRN, 650 mg at 06/09/17 1143 **OR** acetaminophen (TYLENOL) solution 650 mg, 650 mg, Per Tube, Q4H PRN **OR** acetaminophen (TYLENOL) suppository 650 mg, 650 mg, Rectal, Q4H PRN, [name], Shelley J, DO .  albuterol (PROVENTIL) (2.5 MG/3ML) 0.083% nebulizer solution 2.5 mg, 2.5 mg, Nebulization, Q6H PRN, Shelley Shell, MD .  Derrill Memo ON 06/14/2017] clopidogrel (PLAVIX) tablet 75 mg, 75 mg, Oral, Daily, [name], Saima, MD .  enoxaparin  (LOVENOX) injection 40 mg, 40 mg, Subcutaneous, Q24H, [name], Shelley J, DO, 40 mg at 06/12/17 2252 .  hydrALAZINE (APRESOLINE) injection 10 mg, 10 mg, Intravenous, Q4H PRN, Shelley Mainland, DO, 10 mg at 06/08/17 2237 .  levETIRAcetam (KEPPRA) tablet 500 mg, 500 mg, Oral, BID, Shelley Mainland, DO, 500 mg at 06/13/17 1157 .  metoprolol tartrate (LOPRESSOR) tablet 25 mg, 25 mg, Oral, BID, Shelley Shell, MD, 25 mg at 06/13/17 1157 .  senna-docusate (Senokot-S) tablet 1 tablet, 1 tablet, Oral, QHS PRN, Shelley Mainland, DO, 1 tablet at 06/11/17 2157  Patients Current Diet: Diet Heart Room service appropriate? Yes; Fluid consistency: Thin  Precautions / Restrictions Precautions Precautions: Fall Restrictions Weight Bearing Restrictions: No   Has the patient had 2 or more falls or a fall with injury in the past year?No  Prior Activity Level Household: Prior to [employer] patient went out to medical appointments sometimes, spouse managed home tasks, but patient was fully independent with self-care tasks and able to walk around the house without his physical assist.    [place] Devices/Equipment: Environmental consultant (specify type)  Prior Device Use: Indicate devices/aids used by the patient prior to current illness, exacerbation or injury? Walker  Prior Functional Level Prior Function Level of Independence: Independent Comments: Reports that she performs ADLs and IADlLs  Self Care: Did the patient need help bathing, dressing, using the toilet or eating? Independent  Indoor Mobility: Did the patient need assistance with walking from room to room (with or without device)? Independent  Stairs: Did the patient need assistance with internal or external stairs (with or without device)? Independent  Functional Cognition: Did the patient need help planning regular tasks such as shopping or remembering to take medications? Needed some help  Current  Functional Level Cognition  Arousal/Alertness: Awake/alert Overall Cognitive Status: Impaired/Different from baseline Current Attention Level: Sustained Orientation Level: Oriented X4 Following Commands: Follows one step commands with increased time(repetition and multimodal cues) Safety/Judgement: Decreased awareness of safety General Comments: Pt unaware of incontinent BM; repeating questions thoughout. POor awareness of condition/impairments, wants to go home. Decreased initiation for all mobility.  Attention: Sustained Sustained Attention: Appears intact Memory: Impaired Memory Impairment: Storage deficit, Retrieval deficit(severe range on Cognistat) Awareness: Impaired Awareness Impairment: Intellectual impairment Problem Solving: Impaired Problem Solving Impairment: Verbal basic Safety/Judgment: Appears intact    Extremity Assessment (includes Sensation/Coordination)  Upper Extremity Assessment: Generalized weakness  Lower Extremity Assessment: Defer to PT evaluation LLE Coordination: decreased gross motor    ADLs  Overall ADL's : Needs assistance/impaired Eating/Feeding: Set up, Sitting Grooming: Set up, Sitting Grooming Details (indicate cue type and reason): supported sitting; Pt applying lotion to LEs (while LEs elevated in recliner) Upper Body Bathing: Minimal assistance, Sitting Lower Body Bathing: Moderate assistance, Sit to/from stand Upper Body Dressing : Minimal assistance, Sitting Lower Body Dressing: Moderate assistance, Sit to/from stand Toileting- Water quality scientist and Hygiene: Maximal assistance, Sit to/from stand Toileting - Clothing Manipulation Details (indicate cue type and reason): Pt incontinent and unaware of having BM while in bed; required MaxA to complete pericare standing at RW prior to transfer to recliner  Functional mobility during ADLs: Minimal assistance, Rolling walker, Cueing for sequencing, Cueing for safety General ADL Comments: Pt  requires assist for peri-care after incontinent BM in bed; Pt completed bed mobility and stood at RW approx 3 min with minGuard for safety during pericare; Pt completed stand pivot to recliner with MinA, increased time and verbal cues for advancing LEs during transfer; Pt very fearful of falling     Mobility  Overal bed mobility: Needs Assistance Bed Mobility: Supine to Sit Rolling: Min guard(with rail) Sidelying to sit: Min guard(with rail) Supine to sit: Min assist, HOB elevated Sit to supine: Min guard, HOB elevated General bed mobility comments: increased time and effort, min A to elevate trunk to get to EOB.    Transfers  Overall transfer level: Needs assistance Equipment used: Rolling walker (2 wheeled) Transfers: Sit to/from Stand Sit to Stand: Min assist Stand pivot transfers: Min assist General transfer comment: Assist to power to standing with cues for hand placement/technique. Stood from EOB x2, forward flexed posture, cues needed for upright. + dizziness initially.    Ambulation / Gait / Stairs / Wheelchair Mobility  Ambulation/Gait Ambulation/Gait assistance: Mod assist Ambulation Distance (Feet): 6 Feet Assistive device: 1 person hand held assist Gait Pattern/deviations: Step-to pattern, Trunk flexed, Narrow base of support, Decreased stride length, Decreased step length - left, Decreased step length - right General Gait Details: Pt not wanting to use RW, used HH assist and holding onto counter (which spouse reports she is doing at home PTA); very slow to step, difficulty with sequencing. Flexed trunk.  Gait velocity: decreased Gait velocity interpretation: <1.8 ft/sec, indicative of risk for recurrent falls    Posture / Balance Dynamic Sitting Balance Sitting balance - Comments: static sitting EOB; slight posterior bias  Balance Overall balance assessment: Needs assistance Sitting-balance support: Feet supported, Single extremity supported Sitting balance-Leahy  Scale: Fair Sitting balance - Comments: static sitting EOB; slight posterior bias  Postural control: Posterior lean Standing balance support: During functional activity, Bilateral upper extremity supported Standing balance-Leahy Scale: Poor Standing balance comment: Requires BUE support in standing, external support needed today due to bil knee instability; leaning on RW for support  Special needs/care consideration BiPAP/CPAP: No CPM: No Continuous Drip IV: No Dialysis: No         Life Vest: No Oxygen: No Special Bed: No Trach Size: No Wound Vac (area): No       Skin: Dry, bruising to left arm                                Bowel mgmt: Incontinent, last BM 06/11/17 Bladder mgmt: Incontinent, per chart review with blood in urine  Diabetic mgmt: No, HgbA1c- 5.6         Previous Home Environment Living Arrangements: Spouse/significant other  Lives With: Spouse Available Help at Discharge: Family, Available 24 hours/day Type of Home: [place]: No  Discharge Living Setting Plans for Discharge Living Setting: Patient's home, Lives with (comment)(Spouse) Type of Home at Discharge: House Discharge Home Layout: One level Discharge Home Access: Stairs to enter Entrance Stairs-Rails: Can reach both Entrance Stairs-Number of Steps: 4-5 Discharge Bathroom Shower/Tub: Tub/shower unit, Curtain Discharge Bathroom Toilet: Standard Discharge Bathroom Accessibility: Yes How Accessible: Accessible via walker Does the patient have any problems obtaining your medications?: No  Social/Family/Support Systems Patient Roles: Spouse, Parent(grandparent ) Contact Information: Spouse: [place] Chard  Anticipated Caregiver: Spouse  Anticipated Caregiver's Contact Information: Cell: [phone] Ability/Limitations of Caregiver: He has back problems and provide Sueprvision assist  Caregiver Availability: 24/7 Discharge Plan Discussed with Primary Caregiver: Yes Is  Caregiver In Agreement with Plan?: Yes Does Caregiver/Family have Issues with Lodging/Transportation while Pt is in Rehab?: No  Goals/Additional Needs Patient/Family Goal for Rehab: PT/OT: Supervision; SLP: Mod I  Expected length of stay: 8-12 days  Cultural Considerations: Baptist  Dietary Needs: Heart Healthy  Equipment Needs: TBD Special Service Needs: None Pt/Family Agrees to Admission and willing to participate: Yes Program Orientation Provided & Reviewed with Pt/Caregiver Including Roles  & Responsibilities: Yes Additional Information Needs: Patient and spouse discussed her untreated depression, may benefit from Neuropsyh  Information Needs to be Provided By: Juluis Rainier for team   Barriers to Discharge: Incontinence, Other (comments)(Spouse can provide Sueprvision assist )  Decrease burden of Care through IP rehab admission: No  Possible need for SNF placement upon discharge: No  Patient Condition: This patient's medical and functional status has changed since the consult dated: 06/11/17 in which the Rehabilitation Physician determined and documented that the patient's condition is appropriate for intensive rehabilitative care in an inpatient rehabilitation facility. See "History of Present Illness" (above) for medical update. Functional changes are: Min assist transfers, Mod A gait. Patient's medical and functional status update has been discussed with the Rehabilitation physician and patient remains appropriate for inpatient rehabilitation. Will admit to inpatient rehab today.  Preadmission Screen Completed By:  Gunnar Fusi, 06/13/2017 3:25 PM ______________________________________________________________________   Discussed status with Dr. Posey Pronto on 06/13/17 at 1525 and received telephone approval for admission today.  Admission Coordinator:  Gunnar Fusi, time 1525/Date 06/13/17             Cosigned by: Jamse Arn, MD at 06/13/2017 3:51 PM  Revision History

## 2017-06-14 NOTE — Evaluation (Signed)
Physical Therapy Assessment and Plan  Patient Details  Name: Shelley [name] MRN: [id number] Date of Birth: September 06, 1945  PT Diagnosis: Abnormal posture, Abnormality of gait, Cognitive deficits, Difficulty walking, Impaired sensation and Muscle weakness Rehab Potential: Good ELOS: 16-20 days   Today's Date: 06/14/2017 PT Individual Time: 0900-1000 PT Individual Time Calculation (min): 60 min    Problem List:  Patient Active Problem List   Diagnosis Date Noted  . Left pontine cerebrovascular accident ([place]) 06/13/2017  . Benign essential HTN   . Diastolic dysfunction   . Hypokalemia   . Stroke (cerebrum) ([place]) 06/10/2017  . Acute lacunar stroke ([place]) 06/08/2017  . Diplopia 06/08/2017  . Hypertension   . Anxiety   . Seizures ([place])   . Encounter for screening colonoscopy 05/14/2012    Past Medical History:  Past Medical History:  Diagnosis Date  . Alcohol abuse    drinks awhile   . Anxiety   . CVA (cerebral infarction)    pt denies  . Hypertension   . Osteoporosis   . Seizures ([place])    Past Surgical History:  Past Surgical History:  Procedure Laterality Date  . ABDOMINAL HYSTERECTOMY    . BREAST LUMPECTOMY    . COLONOSCOPY WITH PROPOFOL  06/04/2012   Procedure: COLONOSCOPY WITH PROPOFOL;  Surgeon: Danie Binder, MD;  Location: [facility];  Service: Endoscopy;  Laterality: N/A;  entered cecum @ 0752; total cecal withdrawal time = 13 minutes  . EMPYEMA DRAINAGE    . FLEXIBLE SIGMOIDOSCOPY  03/14/99   internal hemorrhoids  . OOPHORECTOMY    . POLYPECTOMY  06/04/2012   Procedure: POLYPECTOMY;  Surgeon: Danie Binder, MD;  Location: [facility];  Service: Endoscopy;  Laterality: N/A;  descending colon polyps x2; rectal polyp x1    Assessment & Plan Clinical Impression: Patient is a 71 y.o. year old right handed femalewith history of hypertension, seizure disorder maintained on Keppra. Per chart reviewand patient,patient lives with spouse. Independent prior to admission.  Presented 06/08/2017 with diplopia as well as fall. CT of the headreviewed, unremarkable for acute process.Patient did not receive TPA. MRI showed acute lacunar infarct in the left dorsal pons. Acute lacunar infarcts also the right cerebellum and right thalamus. MRA of head and neck unremarkable. Echocardiogram with ejection fraction of 12% grade 2 diastolic dysfunction. Maintained on Plavix for CVA prophylaxis. Subcutaneous Lovenox for DVT prophylaxis. She continues on Keppra for history of seizure disorder. Initial plan for TEE possible loop recorder however patient with burst of SVT returning back to sinus rhythm discussed with neurology services and plan was to discontinue TEE no loop recorder needed. Physical and occupational therapy evaluations completed with recommendations of physical medicine rehabilitation consult. Patient was admitted for a comprehensive rehabilitation program.  Patient transferred to CIR on 06/13/2017 .   Patient currently requires mod with mobility secondary to muscle weakness and muscle joint tightness, decreased cardiorespiratoy endurance, impaired timing and sequencing, decreased coordination and decreased motor planning, decreased initiation, decreased attention, decreased awareness, decreased problem solving, decreased safety awareness, decreased memory and delayed processing and decreased sitting balance, decreased standing balance, decreased postural control and decreased balance strategies.  Prior to hospitalization, patient was independent  with mobility and lived with Spouse, Family in a House home.  Home access is 4-5Stairs to enter.  Patient will benefit from skilled PT intervention to maximize safe functional mobility, minimize fall risk and decrease caregiver burden for planned discharge home with 24 hour supervision.  Anticipate patient will benefit from follow up  HH at discharge.  PT - End of Session Activity Tolerance: Decreased this session Endurance  Deficit: Yes Endurance Deficit Description: pt easily fatigued and requires significant amount of time for all tasks PT Assessment Rehab Potential (ACUTE/IP ONLY): Good PT Barriers to Discharge: (unclear how much assistance husband can provide) PT Patient demonstrates impairments in the following area(s): Balance;Endurance;Motor;Perception;Safety;Sensory;Skin Integrity PT Transfers Functional Problem(s): Bed Mobility;Car;Bed to Chair;Furniture PT Locomotion Functional Problem(s): Ambulation;Wheelchair Mobility;Stairs PT Plan PT Intensity: Minimum of 1-2 x/day ,45 to 90 minutes PT Frequency: 5 out of 7 days PT Duration Estimated Length of Stay: 16-20 days PT Treatment/Interventions: Ambulation/gait training;Balance/vestibular training;Cognitive remediation/compensation;Discharge planning;Disease management/prevention;Community reintegration;DME/adaptive equipment instruction;Functional mobility training;Neuromuscular re-education;Pain management;Patient/family education;Psychosocial support;Skin care/wound management;Stair training;Therapeutic Activities;Therapeutic Exercise;UE/LE Strength taining/ROM;UE/LE Coordination activities;Wheelchair propulsion/positioning;Visual/perceptual remediation/compensation PT Transfers Anticipated Outcome(s): supervision basic transfers; min assist car PT Locomotion Anticipated Outcome(s): supervision short distance gait; min assist stairs PT Recommendation Follow Up Recommendations: Home health PT;24 hour supervision/assistance Patient destination: Home Equipment Recommended: To be determined  Skilled Therapeutic Intervention Evaluation completed (see details above and below) with education on PT POC and goals and individual treatment initiated with focus on functional mobility including bed mobility, transfers (sit <> stand, bed > w/c, and w/c <> toilet), and standing balance/tolerance. Pt limited by endurance and requires significant amount of extra time for  all mobility tasks and increased time due to impaired motor planning, endurance, and initiation. Pt required min to mod assist for stand pivot transfers and min to mod assist overall for functional dynamic standing balance with verbal and tactile cues due to cognitive and physical impairments. Pt able to verbalize that she had a stroke and was impacted in her speech and mobility, little insight or awareness into how this affects the greater picture. Will need to further assess gait and stair negotiation, but did not note ataxia during transfers this session.    PT Evaluation Precautions/Restrictions Precautions Precautions: Fall Restrictions Weight Bearing Restrictions: No Pain   No complaints of pain during session just [place] Available Help at Discharge: Family;Available 24 hours/day Type of Home: House Home Access: Stairs to enter [employer] of Steps: 4-5 Entrance Stairs-Rails: Can reach both Home Layout: One level  Lives With: Spouse;Family Prior Function Level of Independence: Independent with gait;Independent with transfers;Independent with homemaking with ambulation  Able to Take Stairs?: Yes Driving: No Vision/Perception  Vision - Assessment Eye Alignment: Impaired (comment) Tracking/Visual Pursuits: Decreased smoothness of horizontal tracking;Decreased smoothness of vertical tracking;Left eye does not track laterally;Impaired - to be further tested in functional context Diplopia Assessment: Only with right gaze Praxis Praxis: Impaired Praxis Impairment Details: Initiation;Motor planning  Cognition Overall Cognitive Status: Impaired/Different from baseline Arousal/Alertness: Awake/alert Orientation Level: Oriented to person;Oriented to place;Oriented to situation Attention: Sustained Sustained Attention: Appears intact Memory: Impaired Memory Impairment: Storage deficit;Retrieval deficit Awareness:  Impaired Awareness Impairment: Intellectual impairment Problem Solving: Impaired Problem Solving Impairment: Functional basic Executive Function: Organizing;Initiating Safety/Judgment: Impaired Sensation Sensation Light Touch: Appears Intact Proprioception: Impaired Detail Proprioception Impaired Details: Impaired RLE Coordination Gross Motor Movements are Fluid and Coordinated: No Fine Motor Movements are Fluid and Coordinated: Yes Motor  Motor Motor: Motor apraxia;Abnormal postural alignment and control Motor - Skilled Clinical Observations: generalized weakness noted   Trunk/Postural Assessment  Cervical Assessment Cervical Assessment: (flexed posture) Thoracic Assessment Thoracic Assessment: (kyphosis) Lumbar Assessment Lumbar Assessment: (posterior pelvic tilt) Postural Control Postural Control: Deficits on evaluation Trunk Control: impaired Righting Reactions: delayed  Balance Balance Balance Assessed: Yes Static Sitting Balance Static Sitting -  Level of Assistance: 5: Stand by assistance Dynamic Sitting Balance Dynamic Sitting - Level of Assistance: 4: Min assist Static Standing Balance Static Standing - Level of Assistance: 4: Min assist;3: Mod assist Dynamic Standing Balance Dynamic Standing - Level of Assistance: 3: Mod assist;4: Min assist Extremity Assessment  RUE Assessment RUE Assessment: Within Functional Limits LUE Assessment LUE Assessment: Within Functional Limits RLE Assessment RLE Assessment: (grossly observed 3+/5) LLE Assessment LLE Assessment: (grossly observed 3+/5)   See Function Navigator for Current Functional Status.   Refer to Care Plan for Long Term Goals  Recommendations for other services: None   Discharge Criteria: Patient will be discharged from PT if patient refuses treatment 3 consecutive times without medical reason, if treatment goals not met, if there is a change in medical status, if patient makes no progress towards  goals or if patient is discharged from hospital.  The above assessment, treatment plan, treatment alternatives and goals were discussed and mutually agreed upon: by patient  Juanna Cao, PT, DPT  06/14/2017, 12:20 PM

## 2017-06-14 NOTE — IPOC Note (Addendum)
Overall Plan of Care [facility]) Patient Details Name: Shelley [name] MRN: [id number] DOB: Apr 03, 1946  Admitting Diagnosis: <principal problem not specified>  Hospital Problems: Active Problems:   Left pontine cerebrovascular accident [facility])     Functional Problem List: Nursing Bladder, Bowel, Endurance, Medication Management, Perception, Safety, Skin Integrity, Sensory  PT Balance, Endurance, Motor, Perception, Safety, Sensory, Skin Integrity  OT Balance, Sensory, Behavior, Cognition, Endurance, Vision, Motor, Pain, Perception  SLP Cognition  TR         Basic ADL's: OT Grooming, Bathing, Dressing, Toileting     Advanced  ADL's: OT       Transfers: PT Bed Mobility, Car, Bed to Chair, Manufacturing systems engineer, Metallurgist: PT Ambulation, Emergency planning/management officer, Stairs     Additional Impairments: OT Fuctional Use of Upper Extremity  SLP Social Cognition   Problem Solving, Memory, Awareness  TR      Anticipated Outcomes Item Anticipated Outcome  Self Feeding supervision   Swallowing      Basic self-care  supervision   Toileting  supervision    Bathroom Transfers supervision   Bowel/Bladder  Min assist  Transfers  supervision basic transfers; min assist car  Locomotion  supervision short distance gait; min assist stairs  Communication     Cognition  Supervision  Pain  2 or less  Safety/Judgment  Min assist   Therapy Plan: PT Intensity: Minimum of 1-2 x/day ,45 to 90 minutes PT Frequency: 5 out of 7 days PT Duration Estimated Length of Stay: 16-20 days OT Intensity: Minimum of 1-2 x/day, 45 to 90 minutes OT Frequency: 5 out of 7 days OT Duration/Estimated Length of Stay: ~20 days SLP Intensity: Minumum of 1-2 x/day, 30 to 90 minutes SLP Frequency: 3 to 5 out of 7 days SLP Duration/Estimated Length of Stay: 16-20    Team Interventions: Nursing Interventions Patient/Family Education, Bladder Management, Medication Management, Discharge Planning,  Bowel Management, Skin Care/Wound Management, Disease Management/Prevention, Cognitive Remediation/Compensation  PT interventions Ambulation/gait training, Balance/vestibular training, Cognitive remediation/compensation, Discharge planning, Disease management/prevention, Community reintegration, DME/adaptive equipment instruction, Functional mobility training, Neuromuscular re-education, Pain management, Patient/family education, Psychosocial support, Skin care/wound management, Stair training, Therapeutic Activities, Therapeutic Exercise, UE/LE Strength taining/ROM, UE/LE Coordination activities, Wheelchair propulsion/positioning, Visual/perceptual remediation/compensation  OT Interventions Training and development officer, Disease mangement/prevention, Neuromuscular re-education, Self Care/advanced ADL retraining, Therapeutic Exercise, Wheelchair propulsion/positioning, Cognitive remediation/compensation, DME/adaptive equipment instruction, Pain management, UE/LE Strength taining/ROM, Skin care/wound managment, UE/LE Coordination activities, Patient/family education, Functional electrical stimulation, Community reintegration, Discharge planning, Functional mobility training, Psychosocial support, Therapeutic Activities, Visual/perceptual remediation/compensation  SLP Interventions Functional tasks, Patient/family education, Therapeutic Activities, Cognitive remediation/compensation, Cueing hierarchy  TR Interventions    SW/CM Interventions Discharge Planning, Psychosocial Support   Barriers to Discharge MD  Medical stability  Nursing      PT (unclear how much assistance husband can provide)    OT      SLP      SW       Team Discharge Planning: Destination: PT-Home ,OT- Home , SLP-Home Projected Follow-up: PT-Home health PT, 24 hour supervision/assistance, OT-  Home health OT, SLP-Home Health SLP, 24 hour supervision/assistance Projected Equipment Needs: PT-To be determined, OT-  to be determined,  SLP-None recommended by SLP Equipment Details: PT- , OT-  Patient/family involved in discharge planning: PT- Patient,  OT-Patient, SLP-Patient  MD ELOS: 13-17d Medical Rehab Prognosis:  Good Assessment:  71 y.o.right handed femalewith history of hypertension, seizure disorder maintained on Keppra. Per chart reviewand patient,patient lives with spouse. Independent  prior to admission. Presented 06/08/2017 with diplopia as well as fall. CT of the headreviewed, unremarkable for acute process.Patient did not receive TPA. MRI showed acute lacunar infarct in the left dorsal pons. Acute lacunar infarcts also the right cerebellum and right thalamus. MRA of head and neck unremarkable. Echocardiogram with ejection fraction of 49% grade 2 diastolic dysfunction. Maintained on Plavix for CVA prophylaxis. Subcutaneous Lovenox for DVT prophylaxis. She continues on Keppra for history of seizure disorder. Initial plan for TEE possible loop recorder however patient with burst of SVT returning back to sinus rhythm discussed with neurology services and plan was to discontinue TEE no loop recorder needed   Now requiring 24/7 Rehab RN,MD, as well as CIR level PT, OT and SLP.  Treatment team will focus on ADLs and mobility with goals set at Supervision   See Team Conference Notes for weekly updates to the plan of care

## 2017-06-14 NOTE — Progress Notes (Signed)
Subjective/Complaints:  Patient slept okay last night.  Review of systems denies chest pain shortness of breath nausea vomiting diarrhea or constipation. Objective: Vital Signs: Blood pressure 125/81, pulse 67, temperature 98.1 F (36.7 C), temperature source Oral, resp. rate 18, height 5' 5" (1.651 m), weight 73.9 kg (163 lb), SpO2 97 %. No results found. Results for orders placed or performed during the hospital encounter of 06/13/17 (from the past 72 hour(s))  CBC     Status: Abnormal   Collection Time: 06/13/17  7:51 PM  Result Value Ref Range   WBC 7.8 4.0 - 10.5 K/uL   RBC 5.10 3.87 - 5.11 MIL/uL   Hemoglobin 15.7 (H) 12.0 - 15.0 g/dL   HCT 48.4 (H) 36.0 - 46.0 %   MCV 94.9 78.0 - 100.0 fL   MCH 30.8 26.0 - 34.0 pg   MCHC 32.4 30.0 - 36.0 g/dL   RDW 14.7 11.5 - 15.5 %   Platelets 181 150 - 400 K/uL  Creatinine, serum     Status: None   Collection Time: 06/13/17  7:51 PM  Result Value Ref Range   Creatinine, Ser 0.66 0.44 - 1.00 mg/dL   GFR calc non Af Amer >60 >60 mL/min   GFR calc Af Amer >60 >60 mL/min    Comment: (NOTE) The eGFR has been calculated using the CKD EPI equation. This calculation has not been validated in all clinical situations. eGFR's persistently <60 mL/min signify possible Chronic Kidney Disease.   CBC WITH DIFFERENTIAL     Status: Abnormal   Collection Time: 06/14/17  7:41 AM  Result Value Ref Range   WBC 8.4 4.0 - 10.5 K/uL   RBC 4.96 3.87 - 5.11 MIL/uL   Hemoglobin 14.7 12.0 - 15.0 g/dL   HCT 47.3 (H) 36.0 - 46.0 %   MCV 95.4 78.0 - 100.0 fL   MCH 29.6 26.0 - 34.0 pg   MCHC 31.1 30.0 - 36.0 g/dL   RDW 14.7 11.5 - 15.5 %   Platelets 147 (L) 150 - 400 K/uL   Neutrophils Relative % 72 %   Neutro Abs 6.1 1.7 - 7.7 K/uL   Lymphocytes Relative 16 %   Lymphs Abs 1.3 0.7 - 4.0 K/uL   Monocytes Relative 11 %   Monocytes Absolute 0.9 0.1 - 1.0 K/uL   Eosinophils Relative 1 %   Eosinophils Absolute 0.1 0.0 - 0.7 K/uL   Basophils Relative 0 %    Basophils Absolute 0.0 0.0 - 0.1 K/uL      General: No acute distress Mood and affect are appropriate Heart: Regular rate and rhythm no rubs murmurs or extra sounds Lungs: Clear to auscultation, breathing unlabored, no rales or wheezes Abdomen: Positive bowel sounds, soft nontender to palpation, nondistended Extremities: No clubbing, cyanosis, or edema Skin: No evidence of breakdown, no evidence of rash Neurologic: Cranial nerves II through XII intact, motor strength is 4/5 in bilateral deltoid, bicep, tricep, grip, hip flexor, knee extensors, ankle dorsiflexor and plantar flexor  Cerebellar exam normal finger to nose to finger as well as heel to shin in bilateral upper and lower extremities Musculoskeletal: Full range of motion in all 4 extremities. No joint swelling   Assessment/Plan: 1. 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 Physiatrist is providing close team supervision and 24 hour management of active  medical problems listed below. Physiatrist and rehab team continue to assess barriers to discharge/monitor patient progress toward functional and medical goals. FIM:       Function - Toileting Toileting activity did not occur: No continent bowel/bladder event           Function - Comprehension Comprehension: Auditory Comprehension assist level: Understands basic 90% of the time/cues < 10% of the time  Function - Expression Expression: Verbal Expression assist level: Expresses basic 90% of the time/requires cueing < 10% of the time.        Function - Memory Patient normally able to recall (first 3 days only): That he or she is in a hospital  Medical Problem List and Plan: 1.  Diplopia with decreased functional mobility  secondary to acute lacunar infarct in the left  dorsal pons and right cerebellum as well as right thalamus  Initiate CIR rehabilitation program 2.  DVT Prophylaxis/Anticoagulation: Subcutaneous Lovenox. Monitor platelet counts in any signs of bleeding  3. Pain Management: Tylenol as needed  4. Mood: Provide emotional support  5. Neuropsych: This patient is  capable of making decisions on his  own behalf. 6. Skin/Wound Care: Routine skin checks  7. Fluids/Electrolytes/Nutrition: Routine I&O's will follow up bmet 8. Seizure disorder. Keppra 500 mg twice a day with no seizures since admission to rehabilitation 9. Hypertension. Lopressor 25 mg twice a day, lisinopril 10 mg daily. Monitor with increased mobility Vitals:   06/13/17 1833 06/14/17 0650  BP: 137/83 125/81  Pulse: 62 67  Resp: 16 18  Temp: (!) 97.5 F (36.4 C) 98.1 F (36.7 C)  SpO2: 96% 97%  Controlled 06/14/2017   LOS (Days) 1 A FACE TO FACE EVALUATION WAS PERFORMED  Charlett Blake 06/14/2017, 8:43 AM

## 2017-06-14 NOTE — Progress Notes (Signed)
Physical Medicine and Rehabilitation Consult Reason for Consult: Decreased functional mobility Referring Physician: [facility]   HPI: Shelley [name] is a 71 y.o. right handed female with history of hypertension, seizure disorder maintained on Keppra. Per chart review and patient, patient lives with spouse. Independent prior to admission. Presented 06/08/2017 with diplopia as well as fall. CT of the head reviewed, unremarkable for acute process. Patient did not receive TPA. MRI showed acute lacunar infarct in the left dorsal pons. Acute lacunar infarcts also the right cerebellum and right thalamus. MRA of head and neck unremarkable. Echocardiogram with ejection fraction of 22% grade 2 diastolic dysfunction. Maintain on aspirin for seizure prophylaxis. Subcutaneous Lovenox for DVT prophylaxis. Await plan for TEE and loop recorder placement. Physical therapy evaluation completed with recommendations of physical medicine rehabilitation consult.  Review of Systems  Constitutional: Negative for chills and fever.  HENT: Negative for hearing loss.   Eyes: Positive for double vision. Negative for blurred vision.  Respiratory: Negative for cough.   Cardiovascular: Positive for leg swelling. Negative for chest pain and palpitations.  Gastrointestinal: Positive for constipation. Negative for nausea and vomiting.  Genitourinary: Negative for dysuria and hematuria.  Skin: Negative for rash.  Neurological: Positive for seizures. Negative for speech change and focal weakness.  Psychiatric/Behavioral:       Anxiety  All other systems reviewed and are negative.      Past Medical History:  Diagnosis Date  . Alcohol abuse    drinks awhile   . Anxiety   . CVA (cerebral infarction)    pt denies  . Hypertension   . Osteoporosis   . Seizures ([place])         Past Surgical History:  Procedure Laterality Date  . ABDOMINAL HYSTERECTOMY    . BREAST LUMPECTOMY    . COLONOSCOPY WITH PROPOFOL   06/04/2012   Procedure: COLONOSCOPY WITH PROPOFOL;  Surgeon: Danie Binder, MD;  Location: [facility];  Service: Endoscopy;  Laterality: N/A;  entered cecum @ 0752; total cecal withdrawal time = 13 minutes  . EMPYEMA DRAINAGE    . FLEXIBLE SIGMOIDOSCOPY  03/14/99   internal hemorrhoids  . OOPHORECTOMY    . POLYPECTOMY  06/04/2012   Procedure: POLYPECTOMY;  Surgeon: Danie Binder, MD;  Location: [facility];  Service: Endoscopy;  Laterality: N/A;  descending colon polyps x2; rectal polyp x1        Family History  Problem Relation Age of Onset  . Colon cancer Neg Hx    Social History:  reports that she has been smoking cigarettes.  She has been smoking about 0.50 packs per day. she has never used smokeless tobacco. She reports that she does not drink alcohol or use drugs. Allergies:  Allergies  Allergen Reactions  . Acyclovir And Related          Medications Prior to Admission  Medication Sig Dispense Refill  . aspirin 81 MG tablet Take 81 mg by mouth daily.    Marland Kitchen levETIRAcetam (KEPPRA) 500 MG tablet Take 500 mg by mouth 2 (two) times daily.    Marland Kitchen lisinopril (PRINIVIL,ZESTRIL) 10 MG tablet Take 10 mg by mouth daily.      Home: Home Living Family/patient expects to be discharged to:: Private residence Living Arrangements: Spouse/significant other Available Help at Discharge: Family, Available 24 hours/day Type of Home: House  Lives With: Spouse  Functional History: Prior Function Level of Independence: Independent Comments: Reports that she performs ADLs and IADlLs Functional Status:  Mobility: Bed Mobility Overal bed  mobility: Needs Assistance Bed Mobility: Rolling, Sidelying to Sit, Sit to Supine Rolling: Min guard(with rail) Sidelying to sit: Min guard(with rail) Sit to supine: Mod assist General bed mobility comments: on return to bed, pt fatigued and required increased assist to raise legs and lower torso avoiding her bed rail Transfers Overall transfer  level: Needs assistance Equipment used: Rolling walker (2 wheeled) Transfers: Sit to/from Stand Sit to Stand: Min assist General transfer comment: Min A to maintaining standing. no phsycial A to power up. Poor hand palcement and requires VCs for safety and upright posture Ambulation/Gait Ambulation/Gait assistance: Mod assist Ambulation Distance (Feet): 10 Feet Assistive device: Rolling walker (2 wheeled) Gait Pattern/deviations: Step-to pattern, Decreased weight shift to left, Ataxic, Drifts right/left, Wide base of support(difficulty placing LLE as she fatigued) Gait velocity: <1.8 ft/sec Gait velocity interpretation: <1.8 ft/sec, indicative of risk for recurrent falls  ADL: ADL Overall ADL's : Needs assistance/impaired Eating/Feeding: Set up, Sitting Grooming: Set up, Sitting Upper Body Bathing: Minimal assistance, Sitting Lower Body Bathing: Moderate assistance, Sit to/from stand Upper Body Dressing : Minimal assistance, Sitting Lower Body Dressing: Moderate assistance, Sit to/from stand Toileting- Water quality scientist and Hygiene: Moderate assistance, Sit to/from stand Toileting - Clothing Manipulation Details (indicate cue type and reason): Poor awarness to compeltely clean herself Functional mobility during ADLs: Minimal assistance, Rolling walker, Cueing for sequencing, Cueing for safety(sit<>Stand) General ADL Comments: Pt performing toielt hygiene after BM in bed. Pt unaware of BM. Pt requiring Min A to maintain standing balance and Mod A for cleaning up. Pt abel to perform peri care but demosntrates decreased awaresness by wiping stool to peri area.   Cognition: Cognition Overall Cognitive Status: Impaired/Different from baseline Arousal/Alertness: Awake/alert Orientation Level: Oriented to person, Oriented to situation, Disoriented to situation, Disoriented to time Attention: Sustained Sustained Attention: Appears intact Memory: Impaired Memory Impairment: Storage  deficit, Retrieval deficit(severe range on Cognistat) Awareness: Impaired Awareness Impairment: Intellectual impairment Problem Solving: Impaired Problem Solving Impairment: Verbal basic Safety/Judgment: Appears intact Cognition Arousal/Alertness: Awake/alert Behavior During Therapy: WFL for tasks assessed/performed Overall Cognitive Status: Impaired/Different from baseline Area of Impairment: Attention, Memory, Following commands, Awareness, Problem solving, Safety/judgement Current Attention Level: Sustained Memory: Decreased short-term memory Following Commands: Follows one step commands with increased time, Follows multi-step commands inconsistently Safety/Judgement: Decreased awareness of safety Awareness: Emergent Problem Solving: Slow processing, Requires verbal cues General Comments: Pt with decreased attention, memory, and problem solving.   Blood pressure (!) 174/103, pulse 87, temperature 98.6 F (37 C), temperature source Oral, resp. rate 18, height 5\' 5"  (1.651 m), weight 74 kg (163 lb 2.2 oz), SpO2 97 %. Physical Exam  Vitals reviewed. Constitutional: She is oriented to person, place, and time. She appears well-developed.  Frail  HENT:  Head: Normocephalic and atraumatic.  Eyes: EOM are normal. Right eye exhibits no discharge. Left eye exhibits no discharge.  Neck: Normal range of motion. Neck supple. No thyromegaly present.  Cardiovascular: Normal rate, regular rhythm and normal heart sounds.  Respiratory: Effort normal and breath sounds normal. No respiratory distress.  GI: Soft. Bowel sounds are normal. She exhibits no distension.  Musculoskeletal:  No edema or tenderness in extremities  Neurological: She is alert and oriented to person, place, and time.  Follows commands. Fair awareness of deficits Motor: 4+/5 throughout No ataxia B/l UE  Skin: Skin is warm and dry.  Psychiatric: She has a normal mood and affect. Her behavior is normal.     Assessment/Plan: Diagnosis: acute lacunar infarct in the left dorsal pons  and right cerebellum and right thalamus Labs and images independently reviewed.  Records reviewed and summated above. Stroke: Continue secondary stroke prophylaxis and Risk Factor Modification listed below:   Antiplatelet therapy:   Blood Pressure Management:  Continue current medication with prn's with permisive HTN per primary team Statin Agent:   Tobacco abuse:    1. Does the need for close, 24 hr/day medical supervision in concert with the patient's rehab needs make it unreasonable for this patient to be served in a less intensive setting? Yes  2. Co-Morbidities requiring supervision/potential complications: HTN (monitor and provide prns in accordance with increased physical exertion and pain), seizure disorder (cont meds), diastolic dysfunction (monitor for signs/symptoms of fluid overload), hypokalemia (continue to monitor and replete as necessary) 3. Due to safety, disease management and patient education, does the patient require 24 hr/day rehab nursing? Yes 4. Does the patient require coordinated care of a physician, rehab nurse, PT (1-2 hrs/day, 5 days/week), OT (1-2 hrs/day, 5 days/week) and SLP (1-2 hrs/day, 5 days/week) to address physical and functional deficits in the context of the above medical diagnosis(es)? Yes Addressing deficits in the following areas: balance, endurance, locomotion, strength, transferring, bathing, dressing, toileting, swallowing and psychosocial support 5. Can the patient actively participate in an intensive therapy program of at least 3 hrs of therapy per day at least 5 days per week? Yes 6. The potential for patient to make measurable gains while on inpatient rehab is excellent 7. Anticipated functional outcomes upon discharge from inpatient rehab are supervision  with PT, supervision with OT, independent and modified independent with SLP. 8. Estimated rehab length of stay to reach  the above functional goals is: 8-12 days. 9. Anticipated D/C setting: Home 10. Anticipated post D/C treatments: HH therapy and Home excercise program 11. Overall Rehab/Functional Prognosis: good  RECOMMENDATIONS: This patient's condition is appropriate for continued rehabilitative care in the following setting: Patient does not believe she require CIR and would like to discuss it with her husband.  Anticipate patient will require CIR, however, after completion of medical workup. Patient has agreed to participate in recommended program. Potentially Note that insurance prior authorization may be required for reimbursement for recommended care.  Comment: Rehab Admissions Coordinator to follow up.  Delice Lesch, MD, ABPMR Lavon Paganini Angiulli, PA-C 06/11/2017          Revision History                        Routing History

## 2017-06-14 NOTE — Evaluation (Signed)
Occupational Therapy Assessment and Plan  Patient Details  Name: Shelley [name] MRN: [id number] Date of Birth: 23-Apr-1946  OT Diagnosis: apraxia and muscle weakness (generalized) Rehab Potential: Rehab Potential (ACUTE ONLY): Good ELOS: ~20 days   Today's Date: 06/15/2017 OT Individual Time:  -  10:00-11:15 (75 min)       Problem List:  Patient Active Problem List   Diagnosis Date Noted  . Left pontine cerebrovascular accident ([place]) 06/13/2017  . Benign essential HTN   . Diastolic dysfunction   . Hypokalemia   . Stroke (cerebrum) ([place]) 06/10/2017  . Acute lacunar stroke ([place]) 06/08/2017  . Diplopia 06/08/2017  . Hypertension   . Anxiety   . Seizures ([place])   . Encounter for screening colonoscopy 05/14/2012    Past Medical History:  Past Medical History:  Diagnosis Date  . Alcohol abuse    drinks awhile   . Anxiety   . CVA (cerebral infarction)    pt denies  . Hypertension   . Osteoporosis   . Seizures ([place])    Past Surgical History:  Past Surgical History:  Procedure Laterality Date  . ABDOMINAL HYSTERECTOMY    . BREAST LUMPECTOMY    . COLONOSCOPY WITH PROPOFOL  06/04/2012   Procedure: COLONOSCOPY WITH PROPOFOL;  Surgeon: Danie Binder, MD;  Location: [facility];  Service: Endoscopy;  Laterality: N/A;  entered cecum @ 0752; total cecal withdrawal time = 13 minutes  . EMPYEMA DRAINAGE    . FLEXIBLE SIGMOIDOSCOPY  03/14/99   internal hemorrhoids  . OOPHORECTOMY    . POLYPECTOMY  06/04/2012   Procedure: POLYPECTOMY;  Surgeon: Danie Binder, MD;  Location: [facility];  Service: Endoscopy;  Laterality: N/A;  descending colon polyps x2; rectal polyp x1    Assessment & Plan Clinical Impression: Patient is a 71 y.o. year old female right handed femalewith history of hypertension, seizure disorder maintained on Keppra. Per chart reviewand patient,patient lives with spouse. Independent prior to admission. Presented 06/08/2017 with diplopia as well as fall. CT of the  headreviewed, unremarkable for acute process.Patient did not receive TPA. MRI showed acute lacunar infarct in the left dorsal pons. Acute lacunar infarcts also the right cerebellum and right thalamus. MRA of head and neck unremarkable. Echocardiogram with ejection fraction of 94% grade 2 diastolic dysfunction. Maintained on Plavix for CVA prophylaxis. Subcutaneous Lovenox for DVT prophylaxis. She continues on Keppra for history of seizure disorder. Initial plan for TEE possible loop recorder however patient with burst of SVT returning back to sinus rhythm discussed with neurology services and plan was to discontinue TEE no loop recorder needed.   Patient transferred to CIR on 06/13/2017 .    Patient currently requires max with basic self-care skills and mod A for basic transfers secondary to muscle weakness, decreased cardiorespiratoy endurance, impaired timing and sequencing, unbalanced muscle activation, decreased coordination and decreased motor planning, decreased visual acuity and decreased visual perceptual skills, decreased motor planning, decreased initiation, decreased attention, decreased awareness, decreased problem solving, decreased safety awareness, decreased memory and delayed processing and decreased sitting balance, decreased standing balance, decreased balance strategies and difficulty maintaining precautions.  Prior to hospitalization, patient could complete ADL with modified independent .  Patient will benefit from skilled intervention to decrease level of assist with basic self-care skills and increase independence with basic self-care skills prior to discharge home with care partner.  Anticipate patient will require 24 hour supervision and intermittent supervision and follow up home health.  OT - End of Session Activity Tolerance:  Tolerates < 10 min activity, no significant change in vital signs Endurance Deficit: Yes OT Assessment Rehab Potential (ACUTE ONLY): Good OT Patient  demonstrates impairments in the following area(s): Balance;Sensory;Behavior;Cognition;Endurance;Vision;Motor;Pain;Perception OT Basic ADL's Functional Problem(s): Grooming;Bathing;Dressing;Toileting OT Transfers Functional Problem(s): Toilet;Tub/Shower OT Additional Impairment(s): Fuctional Use of Upper Extremity OT Plan OT Intensity: Minimum of 1-2 x/day, 45 to 90 minutes OT Frequency: 5 out of 7 days OT Duration/Estimated Length of Stay: ~20 days OT Treatment/Interventions: Balance/vestibular training;Disease mangement/prevention;Neuromuscular re-education;Self Care/advanced ADL retraining;Therapeutic Exercise;Wheelchair propulsion/positioning;Cognitive remediation/compensation;DME/adaptive equipment instruction;Pain management;UE/LE Strength taining/ROM;Skin care/wound managment;UE/LE Coordination activities;Patient/family education;Functional electrical stimulation;Community reintegration;Discharge planning;Functional mobility training;Psychosocial support;Therapeutic Activities;Visual/perceptual remediation/compensation OT Self Feeding Anticipated Outcome(s): supervision  OT Basic Self-Care Anticipated Outcome(s): supervision  OT Toileting Anticipated Outcome(s): supervision  OT Bathroom Transfers Anticipated Outcome(s): supervision  OT Recommendation Recommendations for Other Services: Neuropsych consult Patient destination: Home Follow Up Recommendations: Home health OT   Skilled Therapeutic Intervention 1:1 OT eval initiated with Ot goals, purpose and role discussed. Self care retraining at shower level with focus on stand pivot transfers, initiation of movement, sequencing, orientation, functional problem solving. Pt required mod A for all stand pivot transfers with more than reasonable amt of time. Pt appears to have equal strength functionally in bilateral UEs but decr coordination. Pt also reports feeling dizziness with forward flexion- suspect she will need a vestibular eval. Pt with  low endurance requiring frequent rest breaks throughout session. Pt also with disconjugate gaze however reports it is better than it was.  Pt transferred back into bed with mod A - with A for bilateral LEs back into supine.     OT Evaluation Precautions/Restrictions  Precautions Precautions: Fall Restrictions Weight Bearing Restrictions: No General Chart Reviewed: Yes Family/Caregiver Present: No Vital Signs   Pain   Home Living/Prior Functioning Home Living Available Help at Discharge: Family, Available 24 hours/day Type of Home: House Home Access: Stairs to enter [employer] of Steps: 4-5 Entrance Stairs-Rails: Can reach both Home Layout: One level  Lives With: Spouse, Family Prior Function Level of Independence: Independent with gait, Independent with transfers, Independent with homemaking with ambulation  Able to Take Stairs?: Yes Driving: No ADL   Vision Baseline Vision/History: Wears glasses Wears Glasses: At all times Patient Visual Report: Diplopia Vision Assessment?: Yes Eye Alignment: Impaired (comment) Tracking/Visual Pursuits: Decreased smoothness of horizontal tracking;Decreased smoothness of vertical tracking;Left eye does not track laterally;Impaired - to be further tested in functional context Diplopia Assessment: Only with right gaze Perception    Praxis Praxis: Impaired Praxis Impairment Details: Initiation;Motor planning Cognition Overall Cognitive Status: Impaired/Different from baseline Arousal/Alertness: Awake/alert Orientation Level: Person;Place;Situation Person: Oriented Place: Oriented Situation: Oriented Year: (1976) Month: December Day of Week: Incorrect(tuesday) Memory: Impaired Memory Impairment: Storage deficit;Retrieval deficit Immediate Memory Recall: Sock;Blue;Bed Memory Recall: (0/3) Attention: Sustained Sustained Attention: Appears intact Awareness: Impaired Awareness Impairment: Intellectual  impairment Problem Solving: Impaired Problem Solving Impairment: Functional basic Executive Function: Organizing;Initiating Safety/Judgment: Impaired Sensation Sensation Light Touch: Appears Intact Proprioception: Impaired Detail Proprioception Impaired Details: Impaired RLE Coordination Gross Motor Movements are Fluid and Coordinated: No Fine Motor Movements are Fluid and Coordinated: Yes Motor  Motor Motor - Skilled Clinical Observations: generalized weakness noted Mobility  Transfers Transfers: Sit to Stand;Stand to Sit Sit to Stand: 3: Mod assist Stand to Sit: 4: Min assist  Trunk/Postural Assessment  Cervical Assessment Cervical Assessment: (flexed posture) Thoracic Assessment Thoracic Assessment: (kyphosis) Lumbar Assessment Lumbar Assessment: (posterior pelvic tilt) Postural Control Postural Control: Deficits on evaluation Trunk Control: impaired Righting Reactions: delayed  Balance  Balance Balance Assessed: Yes Static Sitting Balance Static Sitting - Level of Assistance: 5: Stand by assistance Dynamic Sitting Balance Dynamic Sitting - Level of Assistance: 4: Min assist Static Standing Balance Static Standing - Level of Assistance: 4: Min assist;3: Mod assist Dynamic Standing Balance Dynamic Standing - Level of Assistance: 3: Mod assist;4: Min assist Extremity/Trunk Assessment RUE Assessment RUE Assessment: Within Functional Limits LUE Assessment LUE Assessment: Within Functional Limits   See Function Navigator for Current Functional Status.   Refer to Care Plan for Long Term Goals  Recommendations for other services: Neuropsych   Discharge Criteria: Patient will be discharged from OT if patient refuses treatment 3 consecutive times without medical reason, if treatment goals not met, if there is a change in medical status, if patient makes no progress towards goals or if patient is discharged from hospital.  The above assessment, treatment plan,  treatment alternatives and goals were discussed and mutually agreed upon: by patient  Nicoletta Ba 06/14/2017, 11:04 AM

## 2017-06-14 NOTE — Evaluation (Signed)
Speech Language Pathology Assessment and Plan  Patient Details  Name: Shelley [name] MRN: [id number] Date of Birth: 1945-08-20  SLP Diagnosis: Cognitive Impairments  Rehab Potential: Good ELOS: 16-20    Today's Date: 06/14/2017 SLP Individual Time:  -      Problem List:  Patient Active Problem List   Diagnosis Date Noted  . Left pontine cerebrovascular accident ([place]) 06/13/2017  . Benign essential HTN   . Diastolic dysfunction   . Hypokalemia   . Stroke (cerebrum) ([place]) 06/10/2017  . Acute lacunar stroke ([place]) 06/08/2017  . Diplopia 06/08/2017  . Hypertension   . Anxiety   . Seizures ([place])   . Encounter for screening colonoscopy 05/14/2012   Past Medical History:  Past Medical History:  Diagnosis Date  . Alcohol abuse    drinks awhile   . Anxiety   . CVA (cerebral infarction)    pt denies  . Hypertension   . Osteoporosis   . Seizures ([place])    Past Surgical History:  Past Surgical History:  Procedure Laterality Date  . ABDOMINAL HYSTERECTOMY    . BREAST LUMPECTOMY    . COLONOSCOPY WITH PROPOFOL  06/04/2012   Procedure: COLONOSCOPY WITH PROPOFOL;  Surgeon: Danie Binder, MD;  Location: [facility];  Service: Endoscopy;  Laterality: N/A;  entered cecum @ 0752; total cecal withdrawal time = 13 minutes  . EMPYEMA DRAINAGE    . FLEXIBLE SIGMOIDOSCOPY  03/14/99   internal hemorrhoids  . OOPHORECTOMY    . POLYPECTOMY  06/04/2012   Procedure: POLYPECTOMY;  Surgeon: Danie Binder, MD;  Location: [facility];  Service: Endoscopy;  Laterality: N/A;  descending colon polyps x2; rectal polyp x1    Assessment / Plan / Recommendation Clinical Impression Shelley Caley Wilsonis a 71 y.o.right handed femalewith history of hypertension, seizure disorder maintained on Keppra. Per chart reviewand patient,patient lives with spouse. Independent prior to admission. Presented 06/08/2017 with diplopia as well as fall. CT of the headreviewed, unremarkable for acute process.Patient did not receive  TPA. MRI showed acute lacunar infarct in the left dorsal pons. Acute lacunar infarcts also the right cerebellum and right thalamus. MRA of head and neck unremarkable. Echocardiogram with ejection fraction of 93% grade 2 diastolic dysfunction. Maintained on Plavix for CVA prophylaxis. Subcutaneous Lovenox for DVT prophylaxis. She continues on Keppra for history of seizure disorder. Initial plan for TEE possible loop recorder however patient with burst of SVT returning back to sinus rhythm discussed with neurology services and plan was to discontinue TEE no loop recorder needed. Physical and occupational therapy evaluations completed with recommendations of physical medicine rehabilitation consult. Patient was admitted for a comprehensive rehabilitation program on 06/13/17. Cognitive linguistic evaluation completed on 06/14/17.   Pt demonstrates significant cognitive deficits c/b impaired basic problem solving, thought organization, slowed processing, intellectural awareness, and memory retrieval of information. Skilled ST services are indicated to address the above mentioned deficits to increase functional independence and reduce caregiver burden. At time of discharge, anticipate that pt will require 24 hour supervision with follow up [place].   Skilled Therapeutic Interventions          Skilled treatment session focused on completion of cognitive linguistic evaluation, see above. Pt required Mod A cues to recall location, Max A cues to maintain topic (d/t language of confusion), Mod A for retrieval of information and Max A to recall intellectual awareness.    SLP Assessment  Patient will need skilled Speech Lanaguage Pathology Services during CIR admission    Recommendations  Patient destination: Home Follow up Recommendations: Home Health SLP;24 hour supervision/assistance Equipment Recommended: None recommended by SLP    SLP Frequency 3 to 5 out of 7 days   SLP Duration  SLP Intensity  SLP  Treatment/Interventions 16-20  Minumum of 1-2 x/day, 30 to 90 minutes  Functional tasks;Patient/family education;Therapeutic Activities;Cognitive remediation/compensation;Cueing hierarchy    Pain    Prior Functioning Cognitive/Linguistic Baseline: Information not available Type of Home: House  Lives With: Spouse;Family Available Help at Discharge: Family;Available 24 hours/day Vocation: Retired  Function:   Cognition Comprehension Comprehension assist level: Understands basic 90% of the time/cues < 10% of the time  Expression   Expression assist level: Expresses basic 90% of the time/requires cueing < 10% of the time.  Social Interaction Social Interaction assist level: Interacts appropriately 75 - 89% of the time - Needs redirection for appropriate language or to initiate interaction.  Problem Solving Problem solving assist level: Solves basic less than 25% of the time - needs direction nearly all the time or does not effectively solve problems and may need a restraint for safety  Memory Memory assist level: Recognizes or recalls 50 - 74% of the time/requires cueing 25 - 49% of the time   Short Term Goals: Week 1: SLP Short Term Goal 1 (Week 1): Pt will utilize compensatory memory strategies recall new daily activities with Min A cues.  SLP Short Term Goal 2 (Week 1): Pt will maintain current topic of activity or conversation with Mod A cues.  SLP Short Term Goal 3 (Week 1): Pt will demonstrate functional problem solving ability with Mod A cues.  SLP Short Term Goal 4 (Week 1): Pt will demonstrate intellectual awareness by listing 2 physical and 2 cognitive deficits related ot CVA with Mod A cues.  SLP Short Term Goal 5 (Week 1): In a distracting environment, pt will demonstrate sustained attention to tasks for ~ 30 minutes with Mod A cues.   Refer to Care Plan for Long Term Goals  Recommendations for other services: None   Discharge Criteria: Patient will be discharged from  SLP if patient refuses treatment 3 consecutive times without medical reason, if treatment goals not met, if there is a change in medical status, if patient makes no progress towards goals or if patient is discharged from hospital.  The above assessment, treatment plan, treatment alternatives and goals were discussed and mutually agreed upon: by patient  Eliyas Suddreth 06/14/2017, 1:30 PM

## 2017-06-15 ENCOUNTER — Inpatient Hospital Stay (HOSPITAL_COMMUNITY): Payer: Medicare Other | Admitting: Physical Therapy

## 2017-06-15 ENCOUNTER — Inpatient Hospital Stay (HOSPITAL_COMMUNITY): Payer: Medicare Other

## 2017-06-15 ENCOUNTER — Inpatient Hospital Stay (HOSPITAL_COMMUNITY): Payer: Medicare Other | Admitting: Speech Pathology

## 2017-06-15 NOTE — Progress Notes (Signed)
Subjective/Complaints:  Just back from PT but doesn't remember what she did,per Liane Comber PT , worked on car transfers ambulation . Incont of urine, no BM this am  Review of systems denies chest pain shortness of breath nausea vomiting diarrhea or constipation. Objective: Vital Signs: Blood pressure 126/89, pulse (!) 58, temperature 97.8 F (36.6 C), temperature source Oral, resp. rate 16, height _0  (1.651 m), weight 73.9 kg (163 lb), SpO2 97 %. No results found. Results for orders placed or performed during the hospital encounter of 06/13/17 (from the past 72 hour(s))  CBC     Status: Abnormal   Collection Time: 06/13/17  7:51 PM  Result Value Ref Range   WBC 7.8 4.0 - 10.5 K/uL   RBC 5.10 3.87 - 5.11 MIL/uL   Hemoglobin 15.7 (H) 12.0 - 15.0 g/dL   HCT 48.4 (H) 36.0 - 46.0 %   MCV 94.9 78.0 - 100.0 fL   MCH 30.8 26.0 - 34.0 pg   MCHC 32.4 30.0 - 36.0 g/dL   RDW 14.7 11.5 - 15.5 %   Platelets 181 150 - 400 K/uL  Creatinine, serum     Status: None   Collection Time: 06/13/17  7:51 PM  Result Value Ref Range   Creatinine, Ser 0.66 0.44 - 1.00 mg/dL   GFR calc non Af Amer >60 >60 mL/min   GFR calc Af Amer >60 >60 mL/min    Comment: (NOTE) The eGFR has been calculated using the CKD EPI equation. This calculation has not been validated in all clinical situations. eGFR's persistently <60 mL/min signify possible Chronic Kidney Disease.   CBC WITH DIFFERENTIAL     Status: Abnormal   Collection Time: 06/14/17  7:41 AM  Result Value Ref Range   WBC 8.4 4.0 - 10.5 K/uL   RBC 4.96 3.87 - 5.11 MIL/uL   Hemoglobin 14.7 12.0 - 15.0 g/dL   HCT 47.3 (H) 36.0 - 46.0 %   MCV 95.4 78.0 - 100.0 fL   MCH 29.6 26.0 - 34.0 pg   MCHC 31.1 30.0 - 36.0 g/dL   RDW 14.7 11.5 - 15.5 %   Platelets 147 (L) 150 - 400 K/uL   Neutrophils Relative % 72 %   Neutro Abs 6.1 1.7 - 7.7 K/uL   Lymphocytes Relative 16 %   Lymphs Abs 1.3 0.7 - 4.0 K/uL   Monocytes Relative 11 %   Monocytes Absolute 0.9 0.1  - 1.0 K/uL   Eosinophils Relative 1 %   Eosinophils Absolute 0.1 0.0 - 0.7 K/uL   Basophils Relative 0 %   Basophils Absolute 0.0 0.0 - 0.1 K/uL  Comprehensive metabolic panel     Status: Abnormal   Collection Time: 06/14/17  7:41 AM  Result Value Ref Range   Sodium 135 135 - 145 mmol/L   Potassium 4.6 3.5 - 5.1 mmol/L   Chloride 104 101 - 111 mmol/L   CO2 16 (L) 22 - 32 mmol/L   Glucose, Bld 95 65 - 99 mg/dL   BUN 15 6 - 20 mg/dL   Creatinine, Ser 0.65 0.44 - 1.00 mg/dL   Calcium 8.9 8.9 - 10.3 mg/dL   Total Protein 5.8 (L) 6.5 - 8.1 g/dL   Albumin 3.2 (L) 3.5 - 5.0 g/dL   AST 23 15 - 41 U/L   ALT 15 14 - 54 U/L   Alkaline Phosphatase 94 38 - 126 U/L   Total Bilirubin 2.0 (H) 0.3 - 1.2 mg/dL   GFR calc non Af  Amer >60 >60 mL/min   GFR calc Af Amer >60 >60 mL/min    Comment: (NOTE) The eGFR has been calculated using the CKD EPI equation. This calculation has not been validated in all clinical situations. eGFR's persistently <60 mL/min signify possible Chronic Kidney Disease.    Anion gap 15 5 - 15      General: No acute distress Mood and affect are appropriate Heart: Regular rate and rhythm no rubs murmurs or extra sounds Lungs: Clear to auscultation, breathing unlabored, no rales or wheezes Abdomen: Positive bowel sounds, soft nontender to palpation, nondistended Extremities: No clubbing, cyanosis, or edema Skin: No evidence of breakdown, no evidence of rash Neurologic: Cranial nerves II through XII intact, motor strength is 4/5 in bilateral deltoid, bicep, tricep, grip, hip flexor, knee extensors, ankle dorsiflexor and plantar flexor  Cerebellar exam normal finger to nose to finger as well as heel to shin in bilateral upper and lower extremities Musculoskeletal: Full range of motion in all 4 extremities. No joint swelling   Assessment/Plan: 1. Functional deficits secondary to acute lacunar infarct in the left dorsal pons and right cerebellum as well as right thalamus     which require 3+ hours per day of interdisciplinary therapy in a comprehensive inpatient rehab setting. Physiatrist is providing close team supervision and 24 hour management of active medical problems listed below. Physiatrist and rehab team continue to assess barriers to discharge/monitor patient progress toward functional and medical goals. FIM: Function - Bathing Position: Shower Body parts bathed by patient: Right arm, Left arm, Abdomen Body parts bathed by helper: Chest, Front perineal area, Buttocks, Right upper leg, Left upper leg, Right lower leg, Left lower leg, Back Assist Level: Touching or steadying assistance(Pt > 75%)  Function- Upper Body Dressing/Undressing What is the patient wearing?: Button up shirt Button up shirt - Perfomed by helper: Thread/unthread right sleeve, Thread/unthread left sleeve, Pull shirt around back, Button/unbutton shirt Assist Level: Touching or steadying assistance(Pt > 75%) Function - Lower Body Dressing/Undressing What is the patient wearing?: Pants, Non-skid slipper socks Position: Wheelchair/chair at sink Pants- Performed by patient: Thread/unthread left pants leg Pants- Performed by helper: Thread/unthread right pants leg, Pull pants up/down Non-skid slipper socks- Performed by helper: Don/doff right sock, Don/doff left sock Assist for footwear: Maximal assist Assist for lower body dressing: Touching or steadying assistance (Pt > 75%)  Function - Toileting Toileting activity did not occur: No continent bowel/bladder event Toileting steps completed by helper: Adjust clothing prior to toileting, Performs perineal hygiene, Adjust clothing after toileting Toileting Assistive Devices: Grab bar or rail Assist level: Two helpers  Function - Air cabin crew transfer assistive device: Elevated toilet seat/BSC over toilet, Grab bar Assist level to toilet: Moderate assist (Pt 50 - 74%/lift or lower) Assist level from toilet: Moderate  assist (Pt 50 - 74%/lift or lower)  Function - Chair/bed transfer Chair/bed transfer method: Stand pivot Chair/bed transfer assist level: Moderate assist (Pt 50 - 74%/lift or lower) Chair/bed transfer assistive device: Armrests     Function - Comprehension Comprehension: Auditory Comprehension assist level: Understands basic 90% of the time/cues < 10% of the time  Function - Expression Expression: Verbal Expression assist level: Expresses basic 90% of the time/requires cueing < 10% of the time.  Function - Social Interaction Social Interaction assist level: Interacts appropriately 75 - 89% of the time - Needs redirection for appropriate language or to initiate interaction.  Function - Problem Solving Problem solving assist level: Solves basic less than 25% of the time -  needs direction nearly all the time or does not effectively solve problems and may need a restraint for safety  Function - Memory Memory assist level: Recognizes or recalls 25 - 49% of the time/requires cueing 50 - 75% of the time Patient normally able to recall (first 3 days only): Staff names and faces  Medical Problem List and Plan: 1.  Diplopia with decreased functional mobility  secondary to acute lacunar infarct in the left dorsal pons and right cerebellum as well as right thalamus  CIR PT, OT,SLP, participating well 2.  DVT Prophylaxis/Anticoagulation: Subcutaneous Lovenox. Monitor platelet counts in any signs of bleeding  3. Pain Management: Tylenol as needed  4. Mood: Provide emotional support  5. Neuropsych: This patient is  capable of making decisions on her  own behalf.Needs some assist for memory issues 6. Skin/Wound Care: Routine skin checks  7. Fluids/Electrolytes/Nutrition: Routine I&O's will follow up bmet 8. Seizure disorder. Keppra 500 mg twice a day with no seizures since admission to rehabilitation 9. Hypertension. Lopressor 25 mg twice a day, lisinopril 10 mg daily. Monitor with increased  mobility Vitals:   06/14/17 1440 06/15/17 0600  BP: 112/70 126/89  Pulse: 64 (!) 58  Resp: 17 16  Temp: 98.3 F (36.8 C) 97.8 F (36.6 C)  SpO2: 90% 97%  Controlled 06/15/2017   LOS (Days) 2 A FACE TO FACE EVALUATION WAS PERFORMED  Luanna Salk Daleah Coulson 06/15/2017, 8:14 AM

## 2017-06-15 NOTE — Progress Notes (Signed)
Physical Therapy Session Note  Patient Details  Name: Shelley [name] MRN: [id number] Date of Birth: [birth year]  Today's Date: 06/15/2017 PT Individual Time: 0930-1012 PT Individual Time Calculation (min): 42 min   Short Term Goals: Week 1:  PT Short Term Goal 1 (Week 1): Pt will be able to peform functional transfers with min assist  PT Short Term Goal 2 (Week 1): Pt will be able to gait x 25' with mod assist with LRAD PT Short Term Goal 3 (Week 1): Pt will be able to perform 3 steps with mod assist to prepare for home entry re-training  Skilled Therapeutic Interventions/Progress Updates:    Pt seated in w/c upon PT arrival, agreeable to therapy tx and denies pain. Pt reports not sleeping well and not wanting to stand much. Pt transported to gym in w/c total assist. Pt performed sit<>stands with min assist and RW, increased time to complete. Pt standing with RW performed x 5 marches with each LE before reporting "I just need to sit." Pt performed seated therex 2 x 10 LAQ for LE strengthening. Pt worked on gait and endurance, performed ambulation 2 x 14 ft with RW and min assist, slow gait speed, verbal cues for increased step length and RW management, pt limited by fatigue. Max encouragement needed throughout session for participation. Pt propelled w/c part way back to her room x 35 ft with B UEs, min assist for steering and limited by fatigue. Pt left seated in w/c with needs in reach and QRB in place.   Therapy Documentation Precautions:  Precautions Precautions: Fall Restrictions Weight Bearing Restrictions: No   See Function Navigator for Current Functional Status.   Therapy/Group: Individual Therapy  Netta Corrigan, PT, DPT 06/15/2017, 9:48 AM

## 2017-06-15 NOTE — Progress Notes (Signed)
Physical Therapy Session Note  Patient Details  Name: Shelley [name] MRN: [id number] Date of Birth: Aug 15, 1945  Today's Date: 06/15/2017 PT Individual Time: 0800-0900 PT Individual Time Calculation (min): 60 min   Short Term Goals: Week 1:  PT Short Term Goal 1 (Week 1): Pt will be able to peform functional transfers with min assist  PT Short Term Goal 2 (Week 1): Pt will be able to gait x 25' with mod assist with LRAD PT Short Term Goal 3 (Week 1): Pt will be able to perform 3 steps with mod assist to prepare for home entry re-training  Skilled Therapeutic Interventions/Progress Updates:   Pt received supine in bed and agreeable to PT. Semirecumbent>sit transfer with supervision assist and min cues for scooting to EOB.  Modified stand pivot transfer to [facility] with min-mod assist and min cues for technique  Sit<>stand at RW with min assist and increased time. Cues for proper UE placement and increased anterior weight shift.   Gait training with RW x 3f with mod assist. PT required to control AD and assist weight weight shift L and R.   PT instructed pt in Stair negotiation x 2 steps with min assist overall. Moderate cues for step to gait pattern as well as poper UE placement   Car transfer with mod assist and RW. Moderate cues for technique and safety.   Pt reports need for urinaiton. Returned to room and performed stand pivot transfer to the toilet. Min assist and heavy use of rails. Pt able perform peri care, but required assist from PT for clothing management.   Pt  left sitting in WC with call bell in reach and all needs met.           Therapy Documentation Precautions:  Precautions Precautions: Fall Restrictions Weight Bearing Restrictions: No Pain: Pain Assessment Pain Assessment: No/denies pain Pain Score: 0-No pain   See Function Navigator for Current Functional Status.   Therapy/Group: Individual Therapy  [name]12/14/2018, 10:04 AM

## 2017-06-15 NOTE — Progress Notes (Signed)
Speech Language Pathology Daily Session Note  Patient Details  Name: Shelley [name] MRN: [id number] Date of Birth: 1945-07-31  Today's Date: 06/15/2017 SLP Individual Time: 1330-1400 SLP Individual Time Calculation (min): 30 min  Short Term Goals: Week 1: SLP Short Term Goal 1 (Week 1): Pt will utilize compensatory memory strategies recall new daily activities with Min A cues.  SLP Short Term Goal 2 (Week 1): Pt will maintain current topic of activity or conversation with Mod A cues.  SLP Short Term Goal 3 (Week 1): Pt will demonstrate functional problem solving ability with Mod A cues.  SLP Short Term Goal 4 (Week 1): Pt will demonstrate intellectual awareness by listing 2 physical and 2 cognitive deficits related ot CVA with Mod A cues.  SLP Short Term Goal 5 (Week 1): In a distracting environment, pt will demonstrate sustained attention to tasks for ~ 30 minutes with Mod A cues.   Skilled Therapeutic Interventions: Skilled treatment session focused on cognition goals. SLP facilitiated session by providing Total A multimodal cues to use spirometer to specifically target brief moments of sustained attention (30 seconds), follow 1 step directions (<25%), maintain topic/self-awareness of language of confusion (<25%). Pt was left upright in wheelchair with safety belt in place and all needs within reach. Continue per current plan of care.      Function:    Cognition Comprehension Comprehension assist level: Understands basic 25 - 49% of the time/ requires cueing 50 - 75% of the time;Understands basic less than 25% of the time/ requires cueing >75% of the time  Expression   Expression assist level: Expresses basic 25 - 49% of the time/requires cueing 50 - 75% of the time. Uses single words/gestures.;Expresses basis less than 25% of the time/requires cueing >75% of the time.  Social Interaction Social Interaction assist level: Interacts appropriately 75 - 89% of the time - Needs redirection  for appropriate language or to initiate interaction.  Problem Solving Problem solving assist level: Solves basic less than 25% of the time - needs direction nearly all the time or does not effectively solve problems and may need a restraint for safety  Memory Memory assist level: Recognizes or recalls 25 - 49% of the time/requires cueing 50 - 75% of the time;Recognizes or recalls less than 25% of the time/requires cueing greater than 75% of the time    Pain    Therapy/Group: Individual Therapy  Huston Stonehocker 06/15/2017, 2:23 PM

## 2017-06-15 NOTE — Progress Notes (Signed)
Occupational Therapy Session Note  Patient Details  Name: Shelley [name] MRN: [id number] Date of Birth: Feb 24, 1946  Today's Date: 06/15/2017 OT Individual Time: 0700-0758 OT Individual Time Calculation (min): 58 min    Skilled Therapeutic Interventions/Progress Updates:    1:1. Pt supine in bed ready to go. PT reuires significantly increased time to assume supine>sitting EOB with touching A and step by step Vc for sequencing. Pt squat pivot transfer with MOD A and increased time with VC for pivot as pt demo difficulty with motor planning throuhgout transfer. Pt bathes UB at sink with Vc for sequencing bathing body parts as pt perseverates on face and underarms. Pt req1uires A to pull shirt over heard. Pt sit to stand with MOD A for lifting and VC for posture at sink. PT able to advance pants past hips to doff. Pt requires overall total A to don pants. Pt trasnfer back to bed with RW stand pivot and mod A with VC to correct posterior lean and shuffle feet for pivot. Demo spirometer for use to strengthen muscles to cough up spetum. Exited session with pt seated in bed. with call light in reach and all needs met   Therapy Documentation Precautions:  Precautions Precautions: Fall Restrictions Weight Bearing Restrictions: No  See Function Navigator for Current Functional Status.   Therapy/Group: Individual Therapy  Tonny Branch 06/15/2017, 7:24 AM

## 2017-06-16 NOTE — Progress Notes (Signed)
Subjective/Complaints:  No new issues overnight.  Denies pain.  Slept well.  ROS: pt denies nausea, vomiting, diarrhea, cough, shortness of breath or chest pain .   Objective: Vital Signs: Blood pressure (!) 147/88, pulse 75, temperature 98.1 F (36.7 C), temperature source Oral, resp. rate 16, height _0  (1.651 m), weight 73.9 kg (163 lb), SpO2 95 %. No results found. Results for orders placed or performed during the hospital encounter of 06/13/17 (from the past 72 hour(s))  CBC     Status: Abnormal   Collection Time: 06/13/17  7:51 PM  Result Value Ref Range   WBC 7.8 4.0 - 10.5 K/uL   RBC 5.10 3.87 - 5.11 MIL/uL   Hemoglobin 15.7 (H) 12.0 - 15.0 g/dL   HCT 48.4 (H) 36.0 - 46.0 %   MCV 94.9 78.0 - 100.0 fL   MCH 30.8 26.0 - 34.0 pg   MCHC 32.4 30.0 - 36.0 g/dL   RDW 14.7 11.5 - 15.5 %   Platelets 181 150 - 400 K/uL  Creatinine, serum     Status: None   Collection Time: 06/13/17  7:51 PM  Result Value Ref Range   Creatinine, Ser 0.66 0.44 - 1.00 mg/dL   GFR calc non Af Amer >60 >60 mL/min   GFR calc Af Amer >60 >60 mL/min    Comment: (NOTE) The eGFR has been calculated using the CKD EPI equation. This calculation has not been validated in all clinical situations. eGFR's persistently <60 mL/min signify possible Chronic Kidney Disease.   CBC WITH DIFFERENTIAL     Status: Abnormal   Collection Time: 06/14/17  7:41 AM  Result Value Ref Range   WBC 8.4 4.0 - 10.5 K/uL   RBC 4.96 3.87 - 5.11 MIL/uL   Hemoglobin 14.7 12.0 - 15.0 g/dL   HCT 47.3 (H) 36.0 - 46.0 %   MCV 95.4 78.0 - 100.0 fL   MCH 29.6 26.0 - 34.0 pg   MCHC 31.1 30.0 - 36.0 g/dL   RDW 14.7 11.5 - 15.5 %   Platelets 147 (L) 150 - 400 K/uL   Neutrophils Relative % 72 %   Neutro Abs 6.1 1.7 - 7.7 K/uL   Lymphocytes Relative 16 %   Lymphs Abs 1.3 0.7 - 4.0 K/uL   Monocytes Relative 11 %   Monocytes Absolute 0.9 0.1 - 1.0 K/uL   Eosinophils Relative 1 %   Eosinophils Absolute 0.1 0.0 - 0.7 K/uL    Basophils Relative 0 %   Basophils Absolute 0.0 0.0 - 0.1 K/uL  Comprehensive metabolic panel     Status: Abnormal   Collection Time: 06/14/17  7:41 AM  Result Value Ref Range   Sodium 135 135 - 145 mmol/L   Potassium 4.6 3.5 - 5.1 mmol/L   Chloride 104 101 - 111 mmol/L   CO2 16 (L) 22 - 32 mmol/L   Glucose, Bld 95 65 - 99 mg/dL   BUN 15 6 - 20 mg/dL   Creatinine, Ser 0.65 0.44 - 1.00 mg/dL   Calcium 8.9 8.9 - 10.3 mg/dL   Total Protein 5.8 (L) 6.5 - 8.1 g/dL   Albumin 3.2 (L) 3.5 - 5.0 g/dL   AST 23 15 - 41 U/L   ALT 15 14 - 54 U/L   Alkaline Phosphatase 94 38 - 126 U/L   Total Bilirubin 2.0 (H) 0.3 - 1.2 mg/dL   GFR calc non Af Amer >60 >60 mL/min   GFR calc Af Amer >60 >60 mL/min  Comment: (NOTE) The eGFR has been calculated using the CKD EPI equation. This calculation has not been validated in all clinical situations. eGFR's persistently <60 mL/min signify possible Chronic Kidney Disease.    Anion gap 15 5 - 15      General: No acute distress Mood and affect are appropriate Heart:RRR without murmur. No JVD  Lungs: Clear to auscultation, breathing unlabored, no rales or wheezes Abdomen: Positive bowel sounds, soft nontender to palpation, nondistended Extremities: No clubbing, cyanosis, or edema Skin: No evidence of breakdown, no evidence of rash Neurologic: Cranial nerves II through XII intact, motor strength is 4/5 in bilateral deltoid, bicep, tricep, grip, hip flexor, knee extensors, ankle dorsiflexor and plantar flexor  Cerebellar exam normal finger to nose to finger as well as heel to shin in bilateral upper and lower extremities Musculoskeletal: Full range of motion in all 4 extremities. No joint swelling   Assessment/Plan: 1. Functional deficits secondary to acute lacunar infarct in the left dorsal pons and right cerebellum as well as right thalamus    which require 3+ hours per day of interdisciplinary therapy in a comprehensive inpatient rehab  setting. Physiatrist is providing close team supervision and 24 hour management of active medical problems listed below. Physiatrist and rehab team continue to assess barriers to discharge/monitor patient progress toward functional and medical goals. FIM: Function - Bathing Position: Shower Body parts bathed by patient: Right arm, Left arm, Abdomen Body parts bathed by helper: Chest, Front perineal area, Buttocks, Right upper leg, Left upper leg, Right lower leg, Left lower leg, Back Assist Level: Touching or steadying assistance(Pt > 75%)  Function- Upper Body Dressing/Undressing What is the patient wearing?: Button up shirt Button up shirt - Perfomed by helper: Thread/unthread right sleeve, Thread/unthread left sleeve, Pull shirt around back, Button/unbutton shirt Assist Level: Touching or steadying assistance(Pt > 75%) Function - Lower Body Dressing/Undressing What is the patient wearing?: Pants, Non-skid slipper socks Position: Wheelchair/chair at sink Pants- Performed by patient: Thread/unthread left pants leg Pants- Performed by helper: Thread/unthread right pants leg, Pull pants up/down Non-skid slipper socks- Performed by helper: Don/doff right sock, Don/doff left sock Assist for footwear: Maximal assist Assist for lower body dressing: Touching or steadying assistance (Pt > 75%)  Function - Toileting Toileting activity did not occur: No continent bowel/bladder event Toileting steps completed by helper: Adjust clothing prior to toileting, Performs perineal hygiene, Adjust clothing after toileting Toileting Assistive Devices: Grab bar or rail Assist level: Two helpers  Function - Air cabin crew transfer assistive device: Elevated toilet seat/BSC over toilet, Grab bar Assist level to toilet: Moderate assist (Pt 50 - 74%/lift or lower) Assist level from toilet: Moderate assist (Pt 50 - 74%/lift or lower)  Function - Chair/bed transfer Chair/bed transfer method: Stand  pivot Chair/bed transfer assist level: Touching or steadying assistance (Pt > 75%) Chair/bed transfer assistive device: Armrests, Walker  Function - Locomotion: Wheelchair Will patient use wheelchair at discharge?: Yes(tbd) Type: Manual Max wheelchair distance: 35 ft Assist Level: Touching or steadying assistance (Pt > 75%) Wheel 50 feet with 2 turns activity did not occur: Safety/medical concerns Wheel 150 feet activity did not occur: Safety/medical concerns Turns around,maneuvers to table,bed, and toilet,negotiates 3% grade,maneuvers on rugs and over doorsills: No Function - Locomotion: Ambulation Assistive device: Walker-rolling Max distance: 14 ft Assist level: Touching or steadying assistance (Pt > 75%) Assist level: Touching or steadying assistance (Pt > 75%) Walk 50 feet with 2 turns activity did not occur: Safety/medical concerns Walk 150 feet activity did  not occur: Safety/medical concerns Walk 10 feet on uneven surfaces activity did not occur: Safety/medical concerns  Function - Comprehension Comprehension: Auditory Comprehension assist level: Understands basic 25 - 49% of the time/ requires cueing 50 - 75% of the time, Understands basic less than 25% of the time/ requires cueing >75% of the time  Function - Expression Expression: Verbal Expression assist level: Expresses basic 25 - 49% of the time/requires cueing 50 - 75% of the time. Uses single words/gestures., Expresses basis less than 25% of the time/requires cueing >75% of the time.  Function - Social Interaction Social Interaction assist level: Interacts appropriately 75 - 89% of the time - Needs redirection for appropriate language or to initiate interaction.  Function - Problem Solving Problem solving assist level: Solves basic less than 25% of the time - needs direction nearly all the time or does not effectively solve problems and may need a restraint for safety  Function - Memory Memory assist level:  Recognizes or recalls 25 - 49% of the time/requires cueing 50 - 75% of the time, Recognizes or recalls less than 25% of the time/requires cueing greater than 75% of the time Patient normally able to recall (first 3 days only): Current season, That he or she is in a hospital  Medical Problem List and Plan: 1.  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  DVT Prophylaxis/Anticoagulation: Subcutaneous Lovenox. Monitor platelet counts in any signs of bleeding  3. Pain Management: Tylenol as needed  4. Mood: Provide emotional support  5. Neuropsych: This patient is  capable of making decisions on her  own behalf.Needs some assist for memory issues 6. Skin/Wound Care: Routine skin checks  7. Fluids/Electrolytes/Nutrition: Routine I&O's will follow up bmet 8. Seizure disorder. Keppra 500 mg twice a day with no seizures since admission to rehabilitation 9. Hypertension. Lopressor 25 mg twice a day, lisinopril 10 mg daily. Monitor with increased mobility Vitals:   06/15/17 1437 06/16/17 0545  BP: 107/72 (!) 147/88  Pulse: (!) 59 75  Resp: 16 16  Temp: 97.7 F (36.5 C) 98.1 F (36.7 C)  SpO2: 94% 95%  Controlled 06/16/2017   LOS (Days) 3 A FACE TO FACE EVALUATION WAS PERFORMED  SWARTZ,ZACHARY T 06/16/2017, 9:33 AM

## 2017-06-16 NOTE — Progress Notes (Signed)
Social Work Assessment and Plan  Patient Details  Name: Shelley [name] MRN: [id number] Date of Birth: [birth year]  Today's Date: 06/15/2017  Problem List:  Patient Active Problem List   Diagnosis Date Noted  . Left pontine cerebrovascular accident ([place]) 06/13/2017  . Benign essential HTN   . Diastolic dysfunction   . Hypokalemia   . Stroke (cerebrum) ([place]) 06/10/2017  . Acute lacunar stroke ([place]) 06/08/2017  . Diplopia 06/08/2017  . Hypertension   . Anxiety   . Seizures ([place])   . Encounter for screening colonoscopy 05/14/2012   Past Medical History:  Past Medical History:  Diagnosis Date  . Alcohol abuse    drinks awhile   . Anxiety   . CVA (cerebral infarction)    pt denies  . Hypertension   . Osteoporosis   . Seizures ([place])    Past Surgical History:  Past Surgical History:  Procedure Laterality Date  . ABDOMINAL HYSTERECTOMY    . BREAST LUMPECTOMY    . COLONOSCOPY WITH PROPOFOL  06/04/2012   Procedure: COLONOSCOPY WITH PROPOFOL;  Surgeon: Danie Binder, MD;  Location: [facility];  Service: Endoscopy;  Laterality: N/A;  entered cecum @ 0752; total cecal withdrawal time = 13 minutes  . EMPYEMA DRAINAGE    . FLEXIBLE SIGMOIDOSCOPY  03/14/99   internal hemorrhoids  . OOPHORECTOMY    . POLYPECTOMY  06/04/2012   Procedure: POLYPECTOMY;  Surgeon: Danie Binder, MD;  Location: [facility];  Service: Endoscopy;  Laterality: N/A;  descending colon polyps x2; rectal polyp x1   Social History:  reports that she has been smoking cigarettes.  She has been smoking about 0.50 packs per day. she has never used smokeless tobacco. She reports that she does not drink alcohol or use drugs.  Family / Support Systems Marital Status: Married How Long?: 35 years (second marriage for both of them) Patient Roles: Spouse, Parent, Other (Comment)(grandparent) Spouse/Significant Other: Lissandra Keil - husband - [phone] Children: Pt has one son and husband has a dtr.  They have 5  grandchildren together and 3 great grandchildren. Anticipated Caregiver: Spouse  Ability/Limitations of Caregiver: He has back problems, but can provide Sueprvision assist. Caregiver Availability: 24/7 Family Dynamics: husband is very supportive of pt  Social History Preferred language: English Religion: Baptist Cultural Background: baptist Read: Yes Write: Yes Employment Status: Retired Public relations account executive Issues: none reported Guardian/Conservator: N/A - MD has determined that pt is capable of making her own decisions, but needs support with memory issues.   Abuse/Neglect Abuse/Neglect Assessment Can Be Completed: Yes Physical Abuse: Denies Verbal Abuse: Denies Sexual Abuse: Denies Exploitation of patient/patient's resources: Denies Self-Neglect: Denies  Emotional Status Pt's affect, behavior and adjustment status: Pt with a good sense of humor and can be witty/almost quick, but admits to knowing that her health has gone downhill over the past few years.  She seems resigned to the fact that she is where is she functionally and that God has given her a good life and she is ready when her "time comes." Recent Psychosocial Issues: [name] reported that pt has barely left the house for the past three years since she went on seizure medication and gained 60+ pounds. Psychiatric History: Pt with anxiety listed in chart, but CSW questions if pt has been depressed for some time.  No yet diagnoses/documented. Substance Abuse History: none reported by pt or husband, but chart reports some alcohol use at some point.  CSW will continue to assess for this.  Patient / Family Perceptions, Expectations & Goals Pt/Family understanding of illness & functional limitations: Husband reports a good understanding of pt's condition.  With cues for pt's memory, she is able to state she had a stroke, but at first said she had a a heart attack. Premorbid pt/family roles/activities: Per husband,  pt was not doing much but sitting at the table, watching TV, drinking coffee, and smoking over the last three years.  She fills up on unhealthy foods, as well. Anticipated changes in roles/activities/participation: Husband would like to see her be more active and eat healtier, maybe even lose some of the weight she gained to see if that increased her activity level. Pt/family expectations/goals: Husband would like for he and pt to be able to go out to eat again.  He stated pt agreed to this too as she was contemplating coming to CIR.  Community [employer] Agencies: None Premorbid Home Care/DME Agencies: Other (Comment)([name] has a rolling walker, but pt will not use it and prefers furniture walking.  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  He'd love for her to do some tasks. Patient/Family Preliminary Plans: Pt plans to go home with her husband at d/c. Social Work Anticipated Follow Up Needs: HH/OP, Support Group Expected length of stay: 16 to 20 days  Clinical Impression CSW met with pt to introduce self and role of CSW, as well as to complete assessment.  Pt gave permission for CSW to call her husband.  Pt admitted to not doing much the last  couple of years and couldn't even recall if she still sees her PCP or what she does with her time.  She admits to a decline in her health, but seems to have an acceptance about it, stating she is 43 and it's to be expected, but also feels her faith tells her to not be fearful of death and is ready when God is ready for her to die.  [name] wished pt had some better quality of life.  He questions the start of her seizure medication and then subsequent weight gain as the beginning of her decline.  CSW offered support and pt was appreciative.  CSW talked with husband via telephone and is pleased pt is on CIR.  CSW talked with him about expected LOS and level of care needed at d/c.  He is very supportive of pt.  CSW will refer pt to neuropsychologist.    CSW will continue to follow and assist as needed.  Zykia Walla, Silvestre Mesi 06/16/2017, 12:46 AM

## 2017-06-16 NOTE — Progress Notes (Signed)
[facility] Individual Statement of Services  Patient Name:  Shelley [name]  Date:  06/16/2017  Welcome to the [facility].  Our goal is to provide you with an individualized program based on your diagnosis and situation, designed to meet your specific needs.  With this comprehensive rehabilitation program, you will be expected to participate in at least 3 hours of rehabilitation therapies Monday-Friday, with modified therapy programming on the weekends.  Your rehabilitation program will include the following services:  Physical Therapy (PT), Occupational Therapy (OT), Speech Therapy (ST), 24 hour per day rehabilitation nursing, Neuropsychology, Case Management (Social Worker), Rehabilitation Medicine, Nutrition Services and Pharmacy Services  Weekly team conferences will be held on Wednesdays to discuss your progress.  Your Social Worker will talk with you frequently to get your input and to update you on team discussions.  Team conferences with you and your family in attendance may also be held.  Expected length of stay:  16 to 20 days  Overall anticipated outcome:  Supervision with min A for car transfers and stairs  Depending on your progress and recovery, your program may change. Your Social Worker will coordinate services and will keep you informed of any changes. Your Social Worker's name and contact numbers are listed  below.  The following services may also be recommended but are not provided by the [facility] will be made to provide these services after discharge if needed.  Arrangements include referral to agencies that provide these services.  Your insurance has been verified to be:  [employer] Your primary doctor is:  Dr. [name]  Pertinent information will be shared with your doctor and  your insurance company.  Social Worker:  Alfonse Alpers, LCSW  [phone] or [phone]  Information discussed with and copy given to patient by: Trey Sailors, 06/16/2017, 12:23 AM

## 2017-06-17 ENCOUNTER — Inpatient Hospital Stay (HOSPITAL_COMMUNITY): Payer: Medicare Other

## 2017-06-17 MED ORDER — ENOXAPARIN SODIUM 30 MG/0.3ML ~~LOC~~ SOLN
30.0000 mg | Freq: Once | SUBCUTANEOUS | Status: AC
  Administered 2017-06-17: 30 mg via SUBCUTANEOUS
  Filled 2017-06-17: qty 0.3

## 2017-06-17 NOTE — Progress Notes (Signed)
Occupational Therapy Session Note  Patient Details  Name: TERIYAH PURINGTON MRN: [id number] Date of Birth: 1945/08/03  Today's Date: 06/17/2017 OT Individual Time: 1418-1500 OT Individual Time Calculation (min): 42 min    Short Term Goals: Week 1:     Skilled Therapeutic Interventions/Progress Updates:    1;1. Upon entering room pt requires encouragement to participate in tx, but easily pursuaded. Pt requesting to go to bathroom. Pt supine<>EOB<>w/c<>BSC with MOD A for lifting and VC for hand placement throughout mobility with walker/grab bars. Pt able to manage pants with touching A prior to toileting and requires A to pull pants fully past hips after toileting. Pt unable to void on toilet despite increaed time. Pt reports increased confusion today stating, "I dont know where I am." OT reoriented pt to place/situation. Pt washes hands at sink with supervision. Pt stands at dynavision for reaching task 3x1 min with CGA and min VC for scanning. Exited session with pt seated in bed, call light in reach and all needs met.   Therapy Documentation Precautions:  Precautions Precautions: Fall Restrictions Weight Bearing Restrictions: No General:   Vital Signs: Therapy Vitals Temp: 97.9 F (36.6 C) Temp Source: Oral Pulse Rate: (!) 54 Resp: 18 BP: 115/68 Patient Position (if appropriate): Lying Oxygen Therapy SpO2: 97 % O2 Device: Not Delivered  See Function Navigator for Current Functional Status.   Therapy/Group: Individual Therapy  Tonny Branch 06/17/2017, 5:29 PM

## 2017-06-17 NOTE — Progress Notes (Signed)
Subjective/Complaints:  No complaints. Had a reasonable night.   ROS: pt denies nausea, vomiting, diarrhea, cough, shortness of breath or chest pain .   Objective: Vital Signs: Blood pressure 100/68, pulse 69, temperature 98 F (36.7 C), temperature source Oral, resp. rate 18, height 5\' 5"  (1.651 m), weight 73.9 kg (163 lb), SpO2 97 %. No results found. No results found for this or any previous visit (from the past 72 hour(s)).    General: No acute distress Mood and affect are appropriate Heart:RRR without murmur. No JVD  Lungs: CTA Bilaterally without wheezes or rales. Normal effort  Abdomen: Positive bowel sounds, soft nontender to palpation, nondistended Extremities: No clubbing, cyanosis, or edema Skin: No evidence of breakdown, no evidence of rash Neurologic: Cranial nerves II through XII intact, motor strength is 4/5 in bilateral deltoid, bicep, tricep, grip, hip flexor, knee extensors, ankle dorsiflexor and plantar flexor  Cerebellar exam normal finger to nose to finger as well as heel to shin in bilateral upper and lower extremities Musculoskeletal: Full range of motion in all 4 extremities. No joint swelling   Assessment/Plan: 1. 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 Physiatrist is providing close team supervision and 24 hour management of active medical problems listed below. Physiatrist and rehab team continue to assess barriers to discharge/monitor patient progress toward functional and medical goals. FIM: Function - Bathing Position: Shower Body parts bathed by patient: Right arm, Left arm, Abdomen Body parts bathed by helper: Chest, Front perineal area, Buttocks, Right upper leg, Left upper leg, Right lower leg, Left lower leg, Back Assist Level: Touching or steadying  assistance(Pt > 75%)  Function- Upper Body Dressing/Undressing What is the patient wearing?: Button up shirt Button up shirt - Perfomed by helper: Thread/unthread right sleeve, Thread/unthread left sleeve, Pull shirt around back, Button/unbutton shirt Assist Level: Touching or steadying assistance(Pt > 75%) Function - Lower Body Dressing/Undressing What is the patient wearing?: Pants, Non-skid slipper socks Position: Wheelchair/chair at sink Pants- Performed by patient: Thread/unthread left pants leg Pants- Performed by helper: Thread/unthread right pants leg, Pull pants up/down Non-skid slipper socks- Performed by helper: Don/doff right sock, Don/doff left sock Assist for footwear: Maximal assist Assist for lower body dressing: Touching or steadying assistance (Pt > 75%)  Function - Toileting Toileting activity did not occur: No continent bowel/bladder event Toileting steps completed by helper: Adjust clothing prior to toileting, Performs perineal hygiene, Adjust clothing after toileting Toileting Assistive Devices: Grab bar or rail Assist level: Two helpers  Function - Air cabin crew transfer assistive device: Elevated toilet seat/BSC over toilet, Grab bar Assist level to toilet: Moderate assist (Pt 50 - 74%/lift or lower) Assist level from toilet: Moderate assist (Pt 50 - 74%/lift or lower)  Function - Chair/bed transfer Chair/bed transfer method: Stand pivot Chair/bed transfer assist level: Touching or steadying assistance (Pt > 75%) Chair/bed transfer assistive device: Armrests, Walker  Function - Locomotion: Wheelchair Will patient use wheelchair at discharge?: Yes(tbd) Type: Manual Max wheelchair distance: 35 ft Assist Level: Touching or steadying assistance (Pt > 75%) Wheel 50 feet with 2 turns activity did not occur: Safety/medical concerns Wheel 150 feet activity did not occur: Safety/medical concerns Turns around,maneuvers to table,bed, and toilet,negotiates  3% grade,maneuvers on rugs and over doorsills: No Function - Locomotion: Ambulation Assistive device: Walker-rolling Max distance: 14 ft Assist level:  Touching or steadying assistance (Pt > 75%) Assist level: Touching or steadying assistance (Pt > 75%) Walk 50 feet with 2 turns activity did not occur: Safety/medical concerns Walk 150 feet activity did not occur: Safety/medical concerns Walk 10 feet on uneven surfaces activity did not occur: Safety/medical concerns  Function - Comprehension Comprehension: Auditory Comprehension assist level: Understands basic 25 - 49% of the time/ requires cueing 50 - 75% of the time  Function - Expression Expression: Verbal Expression assist level: Expresses basic 25 - 49% of the time/requires cueing 50 - 75% of the time. Uses single words/gestures.  Function - Social Interaction Social Interaction assist level: Interacts appropriately 75 - 89% of the time - Needs redirection for appropriate language or to initiate interaction.  Function - Problem Solving Problem solving assist level: Solves basic less than 25% of the time - needs direction nearly all the time or does not effectively solve problems and may need a restraint for safety  Function - Memory Memory assist level: Recognizes or recalls 25 - 49% of the time/requires cueing 50 - 75% of the time Patient normally able to recall (first 3 days only): Current season, That he or she is in a hospital  Medical Problem List and Plan: 1.  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  DVT Prophylaxis/Anticoagulation: Subcutaneous Lovenox.    -apparently missed a dose last night. Will give 30u this am and resume 40u tonight 3. Pain Management: Tylenol as needed  4. Mood: Provide emotional support  5. Neuropsych: This patient is  capable of making decisions on  her  own behalf.     -memory is an issue thougth 6. Skin/Wound Care: Routine skin checks  7. Fluids/Electrolytes/Nutrition: Routine I&O's will follow up bmet 8. Seizure disorder. Keppra 500 mg twice a day with no seizures since admission to rehabilitation 9. Hypertension. Lopressor 25 mg twice a day, lisinopril 10 mg daily. Monitor with increased mobility Vitals:   06/17/17 0525 06/17/17 0851  BP: 127/75 100/68  Pulse: 79 69  Resp: 16 18  Temp: 98 F (36.7 C)   SpO2: 94% 97%  Controlled 06/17/2017   LOS (Days) 4 A FACE TO FACE EVALUATION WAS PERFORMED  SWARTZ,ZACHARY T 06/17/2017, 9:19 AM

## 2017-06-17 NOTE — Progress Notes (Signed)
Physical Therapy Session Note  Patient Details  Name: Shelley [name] MRN: [id number] Date of Birth: 1945/09/20  Today's Date: 06/17/2017 PT Individual Time: 0903-1000, 1301-1330 PT Individual Time Calculation (min): 57 min , 29 min   Short Term Goals: Week 1:  PT Short Term Goal 1 (Week 1): Pt will be able to peform functional transfers with min assist  PT Short Term Goal 2 (Week 1): Pt will be able to gait x 25' with mod assist with LRAD PT Short Term Goal 3 (Week 1): Pt will be able to perform 3 steps with mod assist to prepare for home entry re-training  Skilled Therapeutic Interventions/Progress Updates:    Session 1: Pt supine in bed upon PT arrival, agreeable to therapy tx and denies pain. Pt transferred from supine>sitting EOB with supervision using bed rails. Pt transferred from bed>w/c stand pivot using RW and min assist, increased time to complete and verbal cues for RW management. Pt transported to gym in w/c. Pt ambulated 2 x 20 ft using RW and min assist, verbal cues for increased step length and gait speed. Pt reporting needing to use the bathroom. Pt transported back to room in w/c. Pt transferred from w/c<>toilet stand pivot with RW and min assist, pt performed clothing management and toileting with supervision. Pt washed hands while seated in w/c at sink. Pt left seated in w/c at end of session with needs in reach and QRB in place.   Session 2:  Pt seated in w/c upon PT arrival, agreeable to therapy tx and denies pain. Pt worked on dynamic standing balance to perform card matching activity. Pt ambulated x 15 ft with RW and min assist. Pt transferred from w/c back to bed, stand pivot with min assist. Pt left supine in bed with needs in reach.   Therapy Documentation Precautions:  Precautions Precautions: Fall Restrictions Weight Bearing Restrictions: No   See Function Navigator for Current Functional Status.   Therapy/Group: Individual Therapy  Netta Corrigan, PT,  DPT 06/17/2017, 9:38 AM

## 2017-06-17 NOTE — Progress Notes (Signed)
Occupational Therapy Session Note  Patient Details  Name: NYCHELLE CASSATA MRN: [id number] Date of Birth: 1946/04/29  Today's Date: 06/17/2017 OT Individual Time: 0700-0800 OT Individual Time Calculation (min): 60 min    Skilled Therapeutic Interventions/Progress Updates:    1:1. Pt sitting EOB preparing breakfast tray upon entering. Pt reportin, "im not happy, I want to go home." OT uses therapeutic use of self to discuss pt displeasures with experience and reassure staff are trying to help pt get home. Pt puts in dentures with set up. PT washes UB from EOB with supervision for sitting balance. Pt dons pull over shirt with touching A for sitting balance as pt is fatigued and leans R. Pt able to thread LLE into pant leg and uses reacher to thread RLE into pants with touching A for sitting balance and stepy by step cues for reacher technique. Pt sot to stand with min A with RW with VC for hand placement. Pt advances pants past hips with touching A. Exited session with pt supine in bed, call light inreach and all needs met.   Therapy Documentation Precautions:  Precautions Precautions: Fall Restrictions Weight Bearing Restrictions: No  See Function Navigator for Current Functional Status.   Therapy/Group: Individual Therapy  Tonny Branch 06/17/2017, 7:13 AM

## 2017-06-18 ENCOUNTER — Inpatient Hospital Stay (HOSPITAL_COMMUNITY): Payer: Medicare Other | Admitting: Speech Pathology

## 2017-06-18 ENCOUNTER — Inpatient Hospital Stay (HOSPITAL_COMMUNITY): Payer: Medicare Other | Admitting: Physical Therapy

## 2017-06-18 ENCOUNTER — Inpatient Hospital Stay (HOSPITAL_COMMUNITY): Payer: Medicare Other | Admitting: Occupational Therapy

## 2017-06-18 ENCOUNTER — Encounter (HOSPITAL_COMMUNITY): Payer: Medicare Other | Admitting: Psychology

## 2017-06-18 DIAGNOSIS — F4323 Adjustment disorder with mixed anxiety and depressed mood: Secondary | ICD-10-CM

## 2017-06-18 NOTE — Progress Notes (Signed)
Subjective/Complaints:  Getting up to eat breakfast.  No new complaints.  Anxious to get home  ROS: pt denies nausea, vomiting, diarrhea, cough, shortness of breath or chest pain    Objective: Vital Signs: Blood pressure 128/84, pulse (!) 53, temperature 97.6 F (36.4 C), temperature source Oral, resp. rate 16, height 5\' 5"  (1.651 m), weight 73.9 kg (163 lb), SpO2 97 %. No results found. No results found for this or any previous visit (from the past 72 hour(s)).    General: No acute distress Mood and affect are appropriate Heart: RRR without murmur. No JVD  Lungs: CTA Bilaterally without wheezes or rales. Normal effort  Abdomen: Positive bowel sounds, soft nontender to palpation, nondistended Extremities: No clubbing, cyanosis, or edema Skin: No evidence of breakdown, no evidence of rash Neurologic: Cranial nerves II through XII intact, motor strength is 4/5 in bilateral deltoid, bicep, tricep, grip, hip flexor, knee extensors, ankle dorsiflexor and plantar flexor Fair insight and awareness Musculoskeletal: Full range of motion in all 4 extremities. No joint swelling Psych: Affect is flat  Assessment/Plan: 1. 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 Physiatrist is providing close team supervision and 24 hour management of active medical problems listed below. Physiatrist and rehab team continue to assess barriers to discharge/monitor patient progress toward functional and medical goals. FIM: Function - Bathing Position: Shower Body parts bathed by patient: Right arm, Left arm, Abdomen Body parts bathed by helper: Chest, Front perineal area, Buttocks, Right upper leg, Left upper leg, Right lower leg, Left lower leg, Back Assist Level: Touching or steadying assistance(Pt > 75%)  Function-  Upper Body Dressing/Undressing What is the patient wearing?: Button up shirt Button up shirt - Perfomed by helper: Thread/unthread right sleeve, Thread/unthread left sleeve, Pull shirt around back, Button/unbutton shirt Assist Level: Touching or steadying assistance(Pt > 75%) Function - Lower Body Dressing/Undressing What is the patient wearing?: Pants, Non-skid slipper socks Position: Wheelchair/chair at sink Pants- Performed by patient: Thread/unthread left pants leg Pants- Performed by helper: Thread/unthread right pants leg, Pull pants up/down Non-skid slipper socks- Performed by helper: Don/doff right sock, Don/doff left sock Assist for footwear: Maximal assist Assist for lower body dressing: Touching or steadying assistance (Pt > 75%)  Function - Toileting Toileting activity did not occur: No continent bowel/bladder event Toileting steps completed by helper: Adjust clothing prior to toileting, Performs perineal hygiene, Adjust clothing after toileting Toileting Assistive Devices: Grab bar or rail Assist level: Two helpers  Function - Air cabin crew transfer assistive device: Elevated toilet seat/BSC over toilet, Grab bar Assist level to toilet: Moderate assist (Pt 50 - 74%/lift or lower) Assist level from toilet: Moderate assist (Pt 50 - 74%/lift or lower)  Function - Chair/bed transfer Chair/bed transfer method: Stand pivot Chair/bed transfer assist level: Touching or steadying assistance (Pt > 75%) Chair/bed transfer assistive device: Armrests, Walker Chair/bed transfer details: Verbal cues for safe use of DME/AE, Verbal cues for precautions/safety  Function - Locomotion: Wheelchair Will patient use wheelchair at discharge?: Yes(tbd) Type: Manual Max wheelchair distance: 35 ft Assist Level: Touching or steadying assistance (Pt > 75%) Wheel 50 feet with 2 turns activity did not occur: Safety/medical concerns Wheel 150 feet activity did not occur: Safety/medical  concerns Turns around,maneuvers to table,bed, and toilet,negotiates 3% grade,maneuvers on rugs and over doorsills: No Function - Locomotion: Ambulation  Assistive device: Walker-rolling Max distance: 20 ft Assist level: Touching or steadying assistance (Pt > 75%) Assist level: Touching or steadying assistance (Pt > 75%) Walk 50 feet with 2 turns activity did not occur: Safety/medical concerns Walk 150 feet activity did not occur: Safety/medical concerns Walk 10 feet on uneven surfaces activity did not occur: Safety/medical concerns  Function - Comprehension Comprehension: Auditory Comprehension assist level: Understands basic 25 - 49% of the time/ requires cueing 50 - 75% of the time  Function - Expression Expression: Verbal Expression assist level: Expresses basic 25 - 49% of the time/requires cueing 50 - 75% of the time. Uses single words/gestures.  Function - Social Interaction Social Interaction assist level: Interacts appropriately 75 - 89% of the time - Needs redirection for appropriate language or to initiate interaction.  Function - Problem Solving Problem solving assist level: Solves basic less than 25% of the time - needs direction nearly all the time or does not effectively solve problems and may need a restraint for safety  Function - Memory Memory assist level: Recognizes or recalls 25 - 49% of the time/requires cueing 50 - 75% of the time Patient normally able to recall (first 3 days only): Current season, That he or she is in a hospital  Medical Problem List and Plan: 1.  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  DVT Prophylaxis/Anticoagulation: Subcutaneous Lovenox.    -Continue Lovenox 40 mg daily 3. Pain Management: Tylenol as needed  4. Mood: Provide emotional support  5. Neuropsych: This patient is  capable of  making decisions on her  own behalf.     -memory deficits at times 6. Skin/Wound Care: Routine skin checks  7. Fluids/Electrolytes/Nutrition: Routine I&O's will follow up bmet 8. Seizure disorder. Keppra 500 mg twice a day with no seizures since admission to rehabilitation 9. Hypertension. Lopressor 25 mg twice a day, lisinopril 10 mg daily.  Good control at present Vitals:   06/17/17 2146 06/18/17 0531  BP: 130/76 128/84  Pulse: 61 (!) 53  Resp:  16  Temp:  97.6 F (36.4 C)  SpO2: 95% 97%      LOS (Days) 5 A FACE TO FACE EVALUATION WAS PERFORMED  Brennah Quraishi T 06/18/2017, 8:52 AM

## 2017-06-18 NOTE — Consult Note (Signed)
Neuropsychological Consultation   Patient:   Shelley [name]   DOB:   1945/11/15  MR Number:  [id number]  Location:  [facility] [id number] [place] [address] Dept: [phone] [facility]: [phone]           Date of Service:   06/18/2017  Start Time:   9:45 End Time:   10:45  Provider/Observer:  Ilean Skill, Psy.D.       Clinical Neuropsychologist       Billing Code/Service: [phone] 4 Units  Chief Complaint:    Shelley [name] is a 71 year old female with history of hypertension and seizure disorder.  Presented on 06/08/2017 with diplopia and fall.  MRI showed acute lacunar infarct in the left dorsal pons.  Acute lacunar infarcts in the right cerebellum and right thalamus as well.  Patient has been improving, but continues to have balance and strength issues.  Coping issues with being in hospital continue with more anxiety vs depressive issues.  Patient does have prior history of anxiety disorder.  Reason for Service:  HPI: Shelley [name] a 60 y.o.right handed femalewith history of hypertension, seizure disorder maintained on Keppra. Per chart reviewand patient,patient lives with spouse. Independent prior to admission. Presented 06/08/2017 with diplopia as well as fall. CT of the headreviewed, unremarkable for acute process.Patient did not receive TPA. MRI showed acute lacunar infarct in the left dorsal pons. Acute lacunar infarcts also the right cerebellum and right thalamus. MRA of head and neck unremarkable. Echocardiogram with ejection fraction of 03% grade 2 diastolic dysfunction. Maintained on Plavix for CVA prophylaxis. Subcutaneous Lovenox for DVT prophylaxis. She continues on Keppra for history of seizure disorder. Initial plan for TEE possible loop recorder however patient with burst of SVT returning back to sinus rhythm discussed with neurology services and plan was to discontinue  TEE no loop recorder needed. Physical and occupational therapy evaluations completed with recommendations of physical medicine rehabilitation consult. Patient was admitted for a comprehensive rehabilitation program  Current Status:  The patient reports that she is rather stressed with being in the hospital and that she is anxious to get back home.  She reports that she is coping better over past day than she was initially doing.  Behavioral Observation: Shelley [name]  presents as a 71 y.o.-year-old Right Caucasian Female who appeared her stated age. her dress was Appropriate and she was Well Groomed and her manners were Appropriate to the situation.  her participation was indicative of Appropriate and Attentive behaviors.  There were physical disabilities noted.  she displayed an appropriate level of cooperation and motivation.     Interactions:    Active Appropriate  Attention:   abnormal and attention span appeared shorter than expected for age  Memory:   within normal limits; recent and remote memory intact  Visuo-spatial:  not examined  Speech (Volume):  normal  Speech:   normal;   Thought Process:  Coherent and Relevant  Though Content:  WNL; not suicidal  Orientation:   person, place, time/date and situation  Judgment:   Fair  Planning:   Fair  Affect:    Anxious  Mood:    Anxious  Insight:   Fair  Intelligence:   normal  Medical History:   Past Medical History:  Diagnosis Date  . Alcohol abuse    drinks awhile   . Anxiety   . CVA (cerebral infarction)    pt denies  .  Hypertension   . Osteoporosis   . Seizures ([place])        Psychiatric History:  Patient has prior history of anxiety, which has been exacerbated with recent CVA.  Also, history is seizure managed with Keppra.    Family Med/Psych History:  Family History  Problem Relation Age of Onset  . Colon cancer Neg Hx     Risk of Suicide/Violence: low Pt denies SI or HI  Impression/DX:  Shelley [name] is a 71 year old female with history of hypertension and seizure disorder.  Presented on 06/08/2017 with diplopia and fall.  MRI showed acute lacunar infarct in the left dorsal pons.  Acute lacunar infarcts in the right cerebellum and right thalamus as well.  Patient has been improving, but continues to have balance and strength issues.  Coping issues with being in hospital continue with more anxiety vs depressive issues.  Patient does have prior history of anxiety disorder.  The patient reports that she is rather stressed with being in the hospital and that she is anxious to get back home.  She reports that she is coping better over past day than she was initially doing.       Electronically Signed   _______________________ Ilean Skill, Psy.D.

## 2017-06-18 NOTE — Progress Notes (Signed)
Occupational Therapy Session Note  Patient Details  Name: Shelley [name] MRN: [id number] Date of Birth: [birth year]  Today's Date: 06/18/2017 OT Individual Time: 1300-1415 OT Individual Time Calculation (min): 75 min   Skilled Therapeutic Interventions/Progress Updates:    Pt seen for OT ADL bathing/dressing session. Pt sitting up in w/c upon arrival, agreeable to tx session. With encouragement, willing to complete bathing at shower level. From w/c level, she gathered clothing items with min cuing. She bathed seated on tub bench, able to cross ankle over knee to wash feet. She ambulated short distance from shower to toilet and dressed seated on toilet. Required significantly increased time to dress with VCs for redirection to task as pt easily distracted by internal and external distractions, talking to self throughout dressing regarding being home, her mother, etc. She stood with steadying assist at RW to pull pants up. Throughout session, she required multimodal cuing for hand placement on RW and grab bars during transfers, no carry over of education btwn transfers. She also required rest breaks throughout seated bathing/dressing tasks due to poor activity tolerance. She completed grooming tasks seated at sink, VCs to locate items and sequencing. Pt returned to supine at end of session, left with all needs in reach, bed alarm on, and husband present.   Therapy Documentation Precautions:  Precautions Precautions: Fall Restrictions Weight Bearing Restrictions: No Pain:   No/ denies pain  See Function Navigator for Current Functional Status.   Therapy/Group: Individual Therapy  Shantal Roan L 06/18/2017, 7:20 AM

## 2017-06-18 NOTE — Progress Notes (Addendum)
Speech Language Pathology Daily Session Note  Patient Details  Name: Shelley [name] MRN: [id number] Date of Birth: May 30, 1946  Today's Date: 06/18/2017 SLP Individual Time: 4944-9675 SLP Individual Time Calculation (min): 60 min  Short Term Goals: Week 1: SLP Short Term Goal 1 (Week 1): Pt will utilize compensatory memory strategies recall new daily activities with Min A cues.  SLP Short Term Goal 2 (Week 1): Pt will maintain current topic of activity or conversation with Mod A cues.  SLP Short Term Goal 3 (Week 1): Pt will demonstrate functional problem solving ability with Mod A cues.  SLP Short Term Goal 4 (Week 1): Pt will demonstrate intellectual awareness by listing 2 physical and 2 cognitive deficits related ot CVA with Mod A cues.  SLP Short Term Goal 5 (Week 1): In a distracting environment, pt will demonstrate sustained attention to tasks for ~ 30 minutes with Mod A cues.   Skilled Therapeutic Interventions: Skilled treatment session focused on cognition goals. SLP received pt in bed. SLP facilitated session by Max A multimodal cues to follow 1 step directions to transfer from bed to wheelchair. Pt required Max A cues d/t decreased attention to task, decreased understanding of task and general confused state. Pt required Max A for attention to putting creamer and sugar in coffee and then more than a reasonable amount of time to open creamer/sugar. With continued cues for orientation of place, pt was able to recall with increased independence but unable to recall after 5 minutes. Pt unable to recall spirometry directions with Total A. Pt able to perform simple money counting and change tasks with Max A cues. Pt able to sort beads according to color with the emergence of self-monitoring for errors. Pt was returned to room, left upright in wheelchair, safety belt donned and all needs within reach. Continue per current plan of care.      Function:    Cognition Comprehension  Comprehension assist level: Understands basic 25 - 49% of the time/ requires cueing 50 - 75% of the time  Expression   Expression assist level: Expresses basic 25 - 49% of the time/requires cueing 50 - 75% of the time. Uses single words/gestures.  Social Interaction Social Interaction assist level: Interacts appropriately 75 - 89% of the time - Needs redirection for appropriate language or to initiate interaction.  Problem Solving Problem solving assist level: Solves basic less than 25% of the time - needs direction nearly all the time or does not effectively solve problems and may need a restraint for safety;Solves basic 25 - 49% of the time - needs direction more than half the time to initiate, plan or complete simple activities  Memory Memory assist level: Recognizes or recalls 25 - 49% of the time/requires cueing 50 - 75% of the time    Pain    Therapy/Group: Individual Therapy  [name] 06/18/2017, 10:07 AM

## 2017-06-18 NOTE — Progress Notes (Signed)
Physical Therapy Session Note  Patient Details  Name: Shelley [name] MRN: [id number] Date of Birth: [birth year]  Today's Date: 06/18/2017 PT Individual Time: 1105-1200 PT Individual Time Calculation (min): 55 min   Short Term Goals: Week 1:  PT Short Term Goal 1 (Week 1): Pt will be able to peform functional transfers with min assist  PT Short Term Goal 2 (Week 1): Pt will be able to gait x 25' with mod assist with LRAD PT Short Term Goal 3 (Week 1): Pt will be able to perform 3 steps with mod assist to prepare for home entry re-training  Skilled Therapeutic Interventions/Progress Updates:  Pt presented in bed with sister-in-law present agreeable to therapy. Pt performed supine to sit with use of features and increased time with min guard and cues for sequencing. Performed stand pivot to w/c with minA no AD and increased time. Pt transported to rehab gym total assist for time management. Performed stand pivot with RW with cues for hand placement and min guard. Pt required increased encouragement to participate in activities as pt stated feeling "very heavy", provided pt edu regarding benefits of activity and current level of deconditioning. Pt performed standing balance activities reaching for horseshoes outside BOS. Pt able to perform all activities without LOB but limited due to increased fatigue. Performed gait training x 5ft with RW, noted decreased R foot clearance and decreasing quad activation with fatigue. Pt required increased time for turns and min verbal cues for safety with RW when performing turns. Pt performed w/c propulsion x 58ft with BUE for endurance in which pt took several rest breaks and significantly increased time. Pt returned to room and remained in w/c to eat lunch with QRB placed and call bell within reach.          Therapy Documentation Precautions:  Precautions Precautions: Fall Restrictions Weight Bearing Restrictions: No General:   Vital Signs: Therapy  Vitals Temp: 97.6 F (36.4 C) Temp Source: Oral Pulse Rate: 70 Resp: 16 BP: 92/67 Patient Position (if appropriate): Lying Oxygen Therapy SpO2: 96 % O2 Device: Not Delivered   See Function Navigator for Current Functional Status.   Therapy/Group: Individual Therapy  [name]  Thekla Colborn, PTA  06/18/2017, 3:58 PM

## 2017-06-19 ENCOUNTER — Inpatient Hospital Stay (HOSPITAL_COMMUNITY): Payer: Medicare Other | Admitting: Physical Therapy

## 2017-06-19 ENCOUNTER — Inpatient Hospital Stay (HOSPITAL_COMMUNITY): Payer: Medicare Other | Admitting: Occupational Therapy

## 2017-06-19 ENCOUNTER — Inpatient Hospital Stay (HOSPITAL_COMMUNITY): Payer: Medicare Other

## 2017-06-19 NOTE — Progress Notes (Signed)
Occupational Therapy Session Note  Patient Details  Name: Shelley [name] MRN: [id number] Date of Birth: [birth year]  Today's Date: 06/19/2017 OT Individual Time: 7824-2353 OT Individual Time Calculation (min): 75 min    Short Term Goals: Week 1:  OT Short Term Goal 1 (Week 1): Pt will complete toilet transfer with min A OT Short Term Goal 2 (Week 1): Pt will stand to complete 2 grooming tasks in order to increase functional activity tolerance OT Short Term Goal 3 (Week 1): Pt will bathe with min A OT Short Term Goal 4 (Week 1): Pt will don UB clothing with VCs  Skilled Therapeutic Interventions/Progress Updates:    Pt seen for OT session focusing on ADL re-training. Pt sitting up in bed upon arrival eating breakfast, and with encouragement agreeable to tx session. She was perseverative on going home, thinking someone told her she would go home today (pt's d/c date not yet set by team). Attempts to orient and redirect. She transferred to EOB using hospital bed functions with VCs for technique. She finished breakfsat seated EOB, pt perseverative on picking up crumbs from muffin and had to be cont redirected to task. Throughout session, pt required significantly increased time and encouragement for all mobility, education being provided throughout regarding importance of participation in order to d/c home and gain independence. She ambulated throughout room with RW, requiring assist for RW management during turns and backing up to sit in w/c. Throughout session she required multimodal cuing for hand placement on RW and grab bars during transfers and ambulation, no carry over of education btwn transfers. She stood to complete toileting tasks and ambulated out of bathroom to complete hand hygiene while standing at sink. Verbal and tactile cues provided for upright posture during ambulation and functional standing tasks.  She dressed seated in w/c, standing with steadying assist to pull pants up and  requiring significantly increased time for all aspects and pt hyperveral about family members, etc throughout session.  Pt left seated in w/c at end of session in therapy day room with hand off to SLP  Therapy Documentation Precautions:  Precautions Precautions: Fall Restrictions Weight Bearing Restrictions: No Pain:   No/ denies pain  See Function Navigator for Current Functional Status.   Therapy/Group: Individual Therapy  [name] L 06/19/2017, 7:06 AM

## 2017-06-19 NOTE — Progress Notes (Signed)
Physical Therapy Session Note  Patient Details  Name: Shelley [name] MRN: [id number] Date of Birth: September 15, 1945  Today's Date: 06/19/2017 PT Individual Time: 1110-1205 PT Individual Time Calculation (min): 55 min   Short Term Goals: Week 1:  PT Short Term Goal 1 (Week 1): Pt will be able to peform functional transfers with min assist  PT Short Term Goal 2 (Week 1): Pt will be able to gait x 25' with mod assist with LRAD PT Short Term Goal 3 (Week 1): Pt will be able to perform 3 steps with mod assist to prepare for home entry re-training  Skilled Therapeutic Interventions/Progress Updates: Pt presented in bed requiring max encouragement to participate in therapy. Performed supine to sit with use of features and required increased time for bed mobility. Pt requesting to return to bed denies pain, only c/o fatigue. With continued max encouragement pt performed stand pivot to w/c with min guard, increased time, and verbal cues for safety. Pt provided continued edu on benefits of mobility, after CVA with pt stating husband will perform all household duties once return to home. Pt transported to rehab gym and performed stand pivot to NuStep with min guard and RW. Pt performed NuStep L1 x 7 min for endurance and reciprocal activity. After rest pt ambulated 15ft with RW and minA. Cues for staying inside RW and improving posture during ambulation. Required cues for hand placement with stand to sit transfer. Pt returned to room and agreeable to sit tin recliner. Performed stand pivot w/c to recliner no AD with pr requiring increased time to perform. Pt refused QRB once in recliner. Pt left with call bell within reach and food tray placed in front of pt.      Therapy Documentation Precautions:  Precautions Precautions: Fall Restrictions Weight Bearing Restrictions: No General:   Vital Signs: Therapy Vitals Temp: 97.7 F (36.5 C) Temp Source: Oral Pulse Rate: (!) 57 Resp: 18 BP: 110/60 Patient  Position (if appropriate): Sitting Oxygen Therapy SpO2: 99 % O2 Device: Not Delivered Pain: Pain Assessment Pain Assessment: No/denies pain  See Function Navigator for Current Functional Status.   Therapy/Group: Individual Therapy  Brendan Gruwell  Eowyn Tabone, PTA  06/19/2017, 4:01 PM

## 2017-06-19 NOTE — Progress Notes (Signed)
Subjective/Complaints:  Working with therapy.  No new complaints.  Had a good night sleep  ROS: pt denies nausea, vomiting, diarrhea, cough, shortness of breath or chest pain   Objective: Vital Signs: Blood pressure 124/70, pulse (!) 59, temperature (!) 97.5 F (36.4 C), temperature source Oral, resp. rate 16, height 5\' 5"  (1.651 m), weight 73.9 kg (163 lb), SpO2 97 %. No results found. No results found for this or any previous visit (from the past 72 hour(s)).    General: No acute distress Mood and affect are appropriate Heart: RRR without murmur. No JVD  Lungs: CTA Bilaterally without wheezes or rales. Normal effort  Abdomen: Positive bowel sounds, soft nontender to palpation, nondistended Extremities: No clubbing, cyanosis, or edema Skin: No evidence of breakdown, no evidence of rash Neurologic: Cranial nerves II through XII intact, motor strength is 4/5 in bilateral deltoid, bicep, tricep, grip, hip flexor, knee extensors, ankle dorsiflexor and plantar flexor Fair insight and awareness Musculoskeletal: Full range of motion in all 4 extremities. No joint swelling Psych: Appropriate affect for the most part  Assessment/Plan: 1. 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 Physiatrist is providing close team supervision and 24 hour management of active medical problems listed below. Physiatrist and rehab team continue to assess barriers to discharge/monitor patient progress toward functional and medical goals. FIM: Function - Bathing Position: Shower Body parts bathed by patient: Right arm, Left arm, Abdomen, Chest, Front perineal area, Buttocks, Right upper leg, Left upper leg, Right lower leg, Left lower leg, Back Body parts bathed by helper: Chest, Front perineal area, Buttocks, Right upper  leg, Left upper leg, Right lower leg, Left lower leg, Back Assist Level: Touching or steadying assistance(Pt > 75%)  Function- Upper Body Dressing/Undressing What is the patient wearing?: Button up shirt Pull over shirt/dress - Perfomed by patient: Thread/unthread right sleeve, Put head through opening, Thread/unthread left sleeve, Pull shirt over trunk Button up shirt - Perfomed by patient: Thread/unthread right sleeve, Thread/unthread left sleeve, Pull shirt around back Button up shirt - Perfomed by helper: Button/unbutton shirt Assist Level: Supervision or verbal cues Function - Lower Body Dressing/Undressing What is the patient wearing?: Pants, Non-skid slipper socks Position: Wheelchair/chair at sink Pants- Performed by patient: Thread/unthread right pants leg, Thread/unthread left pants leg, Pull pants up/down Pants- Performed by helper: Thread/unthread right pants leg, Pull pants up/down Non-skid slipper socks- Performed by patient: Don/doff right sock, Don/doff left sock Non-skid slipper socks- Performed by helper: Don/doff right sock, Don/doff left sock Assist for footwear: Maximal assist Assist for lower body dressing: Touching or steadying assistance (Pt > 75%)  Function - Toileting Toileting activity did not occur: No continent bowel/bladder event Toileting steps completed by patient: Adjust clothing after toileting, Performs perineal hygiene, Adjust clothing prior to toileting Toileting steps completed by helper: Adjust clothing prior to toileting, Performs perineal hygiene, Adjust clothing after toileting Toileting Assistive Devices: Grab bar or rail Assist level: Touching or steadying assistance (Pt.75%)  Function - Air cabin crew transfer assistive device: Elevated toilet seat/BSC over toilet, Grab bar Assist level to toilet: Touching or steadying assistance (Pt > 75%) Assist level from toilet: Touching or steadying assistance (Pt > 75%)  Function - Chair/bed  transfer Chair/bed transfer method: Stand pivot Chair/bed transfer assist level: Touching or steadying assistance (Pt > 75%) Chair/bed transfer assistive device:  Armrests, Walker Chair/bed transfer details: Verbal cues for safe use of DME/AE, Verbal cues for precautions/safety  Function - Locomotion: Wheelchair Will patient use wheelchair at discharge?: Yes(tbd) Type: Manual Max wheelchair distance: 35 ft Assist Level: Touching or steadying assistance (Pt > 75%) Wheel 50 feet with 2 turns activity did not occur: Safety/medical concerns Wheel 150 feet activity did not occur: Safety/medical concerns Turns around,maneuvers to table,bed, and toilet,negotiates 3% grade,maneuvers on rugs and over doorsills: No Function - Locomotion: Ambulation Assistive device: Walker-rolling Max distance: 20 ft Assist level: Touching or steadying assistance (Pt > 75%) Assist level: Touching or steadying assistance (Pt > 75%) Walk 50 feet with 2 turns activity did not occur: Safety/medical concerns Walk 150 feet activity did not occur: Safety/medical concerns Walk 10 feet on uneven surfaces activity did not occur: Safety/medical concerns  Function - Comprehension Comprehension: Auditory Comprehension assist level: Understands basic 25 - 49% of the time/ requires cueing 50 - 75% of the time  Function - Expression Expression: Verbal Expression assist level: Expresses basic 25 - 49% of the time/requires cueing 50 - 75% of the time. Uses single words/gestures.  Function - Social Interaction Social Interaction assist level: Interacts appropriately 75 - 89% of the time - Needs redirection for appropriate language or to initiate interaction.  Function - Problem Solving Problem solving assist level: Solves basic less than 25% of the time - needs direction nearly all the time or does not effectively solve problems and may need a restraint for safety, Solves basic 25 - 49% of the time - needs direction more than  half the time to initiate, plan or complete simple activities  Function - Memory Memory assist level: Recognizes or recalls 25 - 49% of the time/requires cueing 50 - 75% of the time Patient normally able to recall (first 3 days only): Current season, That he or she is in a hospital(With repetition)  Medical Problem List and Plan: 1.  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  DVT Prophylaxis/Anticoagulation: Subcutaneous Lovenox.    -Continue Lovenox 40 mg daily 3. Pain Management: Tylenol as needed  4. Mood: Provide emotional support  5. Neuropsych: This patient is  capable of making decisions on her  own behalf.     -memory deficits at times 6. Skin/Wound Care: Routine skin checks  7. Fluids/Electrolytes/Nutrition: Routine I&O's will follow up bmet 8. Seizure disorder. Keppra 500 mg twice a day with no seizures since admission to rehabilitation 9. Hypertension. Lopressor 25 mg twice a day, lisinopril 10 mg daily.  Good control at present Vitals:   06/18/17 2212 06/19/17 0622  BP: 124/66 124/70  Pulse: (!) 59   Resp:    Temp:  (!) 97.5 F (36.4 C)  SpO2: 96% 97%      LOS (Days) 6 A FACE TO FACE EVALUATION WAS PERFORMED  Reatha Sur T 06/19/2017, 9:04 AM

## 2017-06-19 NOTE — Progress Notes (Signed)
Speech Language Pathology Daily Session Note  Patient Details  Name: Shelley [name] MRN: [id number] Date of Birth: 1946/06/27  Today's Date: 06/19/2017 SLP Individual Time: 3474-2595 SLP Individual Time Calculation (min): 60 min  Short Term Goals: Week 1: SLP Short Term Goal 1 (Week 1): Pt will utilize compensatory memory strategies recall new daily activities with Min A cues.  SLP Short Term Goal 2 (Week 1): Pt will maintain current topic of activity or conversation with Mod A cues.  SLP Short Term Goal 3 (Week 1): Pt will demonstrate functional problem solving ability with Mod A cues.  SLP Short Term Goal 4 (Week 1): Pt will demonstrate intellectual awareness by listing 2 physical and 2 cognitive deficits related ot CVA with Mod A cues.  SLP Short Term Goal 5 (Week 1): In a distracting environment, pt will demonstrate sustained attention to tasks for ~ 30 minutes with Mod A cues.   Skilled Therapeutic Interventions: Skilled ST services focused on cognitive skills. SLP facilitated recall of pervious therapy session events that occurred right before ST session, Pt required unable to recall events, given written cues recall events following 1 minute delay and extra time, however given a 3 minute delay required visual aid to recall events. SLP facilitated sequencing 3 action cards, pt required max-mod A verbal cues for problem solving and mod A verbal cues to correct errors. SLP facilitated identification of 2 physical deficits and 2 cognitive deficits with mod A verbal cues. Pt required mod A verbal to maintain topic and mod A verbal cues for redirection. SLP instructed pt in use of spirometry, pt unable to initially recall directions for spirometry,howevever demonstrated recall of directions with 5 minute delay.Pt was left in room with call bell within reach. Recommend to continue skilled St services.      Function:  Eating Eating     Eating Assist Level: Set up assist for   Eating Set Up  Assist For: Opening containers       Cognition Comprehension Comprehension assist level: Understands basic 90% of the time/cues < 10% of the time  Expression   Expression assist level: Expresses basic needs/ideas: With no assist  Social Interaction Social Interaction assist level: Interacts appropriately 75 - 89% of the time - Needs redirection for appropriate language or to initiate interaction.  Problem Solving Problem solving assist level: Solves basic 50 - 74% of the time/requires cueing 25 - 49% of the time  Memory Memory assist level: Recognizes or recalls 25 - 49% of the time/requires cueing 50 - 75% of the time    Pain Pain Assessment Pain Assessment: No/denies pain  Therapy/Group: Individual Therapy  Tyrail Grandfield  [facility] 06/19/2017, 12:48 PM

## 2017-06-20 ENCOUNTER — Inpatient Hospital Stay (HOSPITAL_COMMUNITY): Payer: Medicare Other | Admitting: Occupational Therapy

## 2017-06-20 ENCOUNTER — Inpatient Hospital Stay (HOSPITAL_COMMUNITY): Payer: Medicare Other | Admitting: Physical Therapy

## 2017-06-20 ENCOUNTER — Inpatient Hospital Stay (HOSPITAL_COMMUNITY): Payer: Medicare Other

## 2017-06-20 LAB — CREATININE, SERUM
Creatinine, Ser: 0.72 mg/dL (ref 0.44–1.00)
GFR calc non Af Amer: >60 mL/min (ref >60)

## 2017-06-20 MED ORDER — NICOTINE 21 MG/24HR TD PT24
21.0000 mg | MEDICATED_PATCH | Freq: Every day | TRANSDERMAL | Status: DC
  Administered 2017-06-20 – 2017-06-29 (×10): 21 mg via TRANSDERMAL
  Filled 2017-06-20 (×10): qty 1

## 2017-06-20 NOTE — Progress Notes (Signed)
Subjective/Complaints:  Patient is sitting up feeding herself at edge of bed.  Asking when she is going to be able to go home.  No complaints today  ROS: pt denies nausea, vomiting, diarrhea, cough, shortness of breath or chest pain   Objective: Vital Signs: Blood pressure 115/65, pulse 68, temperature 98.4 F (36.9 C), temperature source Oral, resp. rate 18, height '5\' 5"'  (1.651 m), weight 73.9 kg (163 lb), SpO2 96 %. No results found. Results for orders placed or performed during the hospital encounter of 06/13/17 (from the past 72 hour(s))  Creatinine, serum     Status: None   Collection Time: 06/20/17  5:31 AM  Result Value Ref Range   Creatinine, Ser 0.72 0.44 - 1.00 mg/dL   GFR calc non Af Amer >60 >60 mL/min   GFR calc Af Amer >60 >60 mL/min    Comment: (NOTE) The eGFR has been calculated using the CKD EPI equation. This calculation has not been validated in all clinical situations. eGFR's persistently <60 mL/min signify possible Chronic Kidney Disease.       General: No acute distress Mood and affect are appropriate Heart: RRR without murmur. No JVD   Lungs: CTA Bilaterally without wheezes or rales. Normal effort  Abdomen: BS +, non-tender, non-distended  Extremities: No clubbing, cyanosis, or edema Skin: No evidence of breakdown, no evidence of rash Neurologic: Cranial nerves II through XII intact, motor strength is 4/5 in bilateral deltoid, bicep, tricep, grip, hip flexor, knee extensors, ankle dorsiflexor and plantar flexor Fair insight and awareness Musculoskeletal: Full range of motion in all 4 extremities. No joint swelling Psych: Appropriate affect for the most part  Assessment/Plan: 1. 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 Physiatrist is providing close team  supervision and 24 hour management of active medical problems listed below. Physiatrist and rehab team continue to assess barriers to discharge/monitor patient progress toward functional and medical goals. FIM: Function - Bathing Position: Shower Body parts bathed by patient: Right arm, Left arm, Abdomen, Chest, Front perineal area, Buttocks, Right upper leg, Left upper leg, Right lower leg, Left lower leg, Back Body parts bathed by helper: Chest, Front perineal area, Buttocks, Right upper leg, Left upper leg, Right lower leg, Left lower leg, Back Assist Level: Touching or steadying assistance(Pt > 75%)  Function- Upper Body Dressing/Undressing What is the patient wearing?: Button up shirt Pull over shirt/dress - Perfomed by patient: Thread/unthread right sleeve, Put head through opening, Thread/unthread left sleeve, Pull shirt over trunk Button up shirt - Perfomed by patient: Thread/unthread right sleeve, Thread/unthread left sleeve, Pull shirt around back Button up shirt - Perfomed by helper: Button/unbutton shirt Assist Level: Supervision or verbal cues Function - Lower Body Dressing/Undressing What is the patient wearing?: Pants, Non-skid slipper socks Position: Wheelchair/chair at sink Pants- Performed by patient: Thread/unthread right pants leg, Thread/unthread left pants leg, Pull pants up/down Pants- Performed by helper: Thread/unthread right pants leg, Pull pants up/down Non-skid slipper socks- Performed by patient: Don/doff right sock, Don/doff left sock Non-skid slipper socks- Performed by helper: Don/doff right sock, Don/doff left sock Assist for footwear: Maximal assist Assist for lower body dressing: Touching or steadying assistance (Pt > 75%)  Function - Toileting Toileting activity did not occur: No continent bowel/bladder event Toileting steps completed by patient: Adjust clothing prior to toileting, Performs perineal hygiene,  Adjust clothing after toileting Toileting  steps completed by helper: Adjust clothing prior to toileting, Performs perineal hygiene, Adjust clothing after toileting Toileting Assistive Devices: Grab bar or rail Assist level: Touching or steadying assistance (Pt.75%)  Function - Toilet Transfers Toilet transfer assistive device: Elevated toilet seat/BSC over toilet, Grab bar Assist level to toilet: Touching or steadying assistance (Pt > 75%) Assist level from toilet: Touching or steadying assistance (Pt > 75%)  Function - Chair/bed transfer Chair/bed transfer method: Stand pivot Chair/bed transfer assist level: Touching or steadying assistance (Pt > 75%) Chair/bed transfer assistive device: Armrests, Walker Chair/bed transfer details: Verbal cues for safe use of DME/AE, Verbal cues for precautions/safety  Function - Locomotion: Wheelchair Will patient use wheelchair at discharge?: Yes(tbd) Type: Manual Max wheelchair distance: 35 ft Assist Level: Touching or steadying assistance (Pt > 75%) Wheel 50 feet with 2 turns activity did not occur: Safety/medical concerns Wheel 150 feet activity did not occur: Safety/medical concerns Turns around,maneuvers to table,bed, and toilet,negotiates 3% grade,maneuvers on rugs and over doorsills: No Function - Locomotion: Ambulation Assistive device: Walker-rolling Max distance: 20 ft Assist level: Touching or steadying assistance (Pt > 75%) Assist level: Touching or steadying assistance (Pt > 75%) Walk 50 feet with 2 turns activity did not occur: Safety/medical concerns Walk 150 feet activity did not occur: Safety/medical concerns Walk 10 feet on uneven surfaces activity did not occur: Safety/medical concerns  Function - Comprehension Comprehension: Auditory Comprehension assist level: Understands basic 90% of the time/cues < 10% of the time  Function - Expression Expression: Verbal Expression assist level: Expresses basic needs/ideas: With no assist  Function - Social  Interaction Social Interaction assist level: Interacts appropriately 75 - 89% of the time - Needs redirection for appropriate language or to initiate interaction.  Function - Problem Solving Problem solving assist level: Solves basic 50 - 74% of the time/requires cueing 25 - 49% of the time  Function - Memory Memory assist level: Recognizes or recalls 25 - 49% of the time/requires cueing 50 - 75% of the time Patient normally able to recall (first 3 days only): Current season, That he or she is in a hospital(With repetition)  Medical Problem List and Plan: 1.  Diplopia with decreased functional mobility  secondary to acute lacunar infarct in the left dorsal pons and right cerebellum as well as right thalamus  CIR PT, OT,SLP--team conference today 2.  DVT Prophylaxis/Anticoagulation: Subcutaneous Lovenox.    -Continue Lovenox 40 mg daily 3. Pain Management: Tylenol as needed  4. Mood: Provide emotional support  5. Neuropsych: This patient is  capable of making decisions on her  own behalf.     -memory deficits at times 6. Skin/Wound Care: Routine skin checks  7. Fluids/Electrolytes/Nutrition: Routine I&O's will follow up bmet 8. Seizure disorder. Keppra 500 mg twice a day with no seizures since admission to rehabilitation 9. Hypertension. Lopressor 25 mg twice a day, lisinopril 10 mg daily.  Blood pressure under control Vitals:   06/19/17 2237 06/20/17 0418  BP: 129/73 115/65  Pulse: 60 68  Resp:  18  Temp:  98.4 F (36.9 C)  SpO2:  96%      LOS (Days) 7 A FACE TO FACE EVALUATION WAS PERFORMED  SWARTZ,ZACHARY T 06/20/2017, 8:38 AM

## 2017-06-20 NOTE — Patient Care Conference (Signed)
Inpatient RehabilitationTeam Conference and Plan of Care Update Date: 06/20/2017   Time: 10:30 AM    Patient Name: Shelley [name]      Medical Record Number: [id number]  Date of Birth: [birth year] Sex: Female         Room/Bed: 4W17C/4W17C-01 Payor Info: Payor: [employer] MEDICARE / Plan: [facility] MEDICARE / Product Type: *No Product type* /    Admitting Diagnosis: CVA  Admit Date/Time:  06/13/2017  6:23 PM Admission Comments: No comment available   Primary Diagnosis:  <principal problem not specified> Principal Problem: <principal problem not specified>  Patient Active Problem List   Diagnosis Date Noted  . Adjustment disorder with mixed anxiety and depressed mood   . Left pontine cerebrovascular accident ([place]) 06/13/2017  . Benign essential HTN   . Diastolic dysfunction   . Hypokalemia   . Stroke (cerebrum) ([place]) 06/10/2017  . Acute lacunar stroke ([place]) 06/08/2017  . Diplopia 06/08/2017  . Hypertension   . Anxiety   . Seizures ([place])   . Encounter for screening colonoscopy 05/14/2012    Expected Discharge Date: Expected Discharge Date: 06/29/17  Team Members Present: Physician leading conference: Dr. Delice Lesch Social Worker Present: Alfonse Alpers, LCSW Nurse Present: Dorthula Nettles, RN PT Present: Barrie Folk, PT;Rosita Dechalus, PTA OT Present: Napoleon Form, OT SLP Present: Charolett Bumpers, SLP PPS Coordinator present : Daiva Nakayama, RN, CRRN     Current Status/Progress Goal Weekly Team Focus  Medical   left pontine infarct, hypertensive. improved intake  improve functional mobility  nutrition, bp control, stroke ed   Bowel/Bladder   Incontient of bladder/bowel with urgency  Contient of bladder/bowel   Assess and provide Incontient care with bowel and bladder training   Swallow/Nutrition/ Hydration             ADL's   Min A overall with increased time, encouragement, VCs for attention to task and rest breaks for all ADLs  Supervision overall  ADL re-training,  functional activity tolerance, d/c planning, family education   Mobility   min guard bed mobility with use of features, minA at times min gurd sit/stand transfers, gait 106ft with RW, poor endurance  supervision transfers and gait; min stairs and car  participation, endurance, transfers, gait, stair training   Communication             Safety/Cognition/ Behavioral Observations  max-mod A,id deficits,sustained attention,recall, orientation, basic problem solving   Supervision  identifying deficits, selective attention/topic maintaince, problem solving, recall strategies    Pain   Denies  Pain slight discomfort to LLE with touch      0  Assess and address c/o of pain and discomfort   Skin   BLE dry, scaly, and flaky. Eucerin cream applied BID  No new breakdown while on rehab  assess skin q shift and prn    Rehab Goals Patient on target to meet rehab goals: Yes Rehab Goals Revised: none *See Care Plan and progress notes for long and short-term goals.     Barriers to Discharge  Current Status/Progress Possible Resolutions Date Resolved   Physician    Medical stability        manage medical issues      Nursing                  PT                    OT  SLP                SW                Discharge Planning/Teaching Needs:  Pt to return to her home with her husband to provide 24/7 supervision.  [name] can come to go through therapy closer to d/c, as needed.   Team Discussion:  Pt is overall medically stable with stable HTN and no seizures.  Pt started on nicotine patch.  Pt needs a lot of encouragement and time to do anything, but then is min A overall.  She is internally and externally distracted per SLP.  Pt is S to min guard with PT and walked 30' ambulation.   Revisions to Treatment Plan:  none    Continued Need for Acute Rehabilitation Level of Care: The patient requires daily medical management by a physician with specialized training in physical  medicine and rehabilitation for the following conditions: Daily direction of a multidisciplinary physical rehabilitation program to ensure safe treatment while eliciting the highest outcome that is of practical value to the patient.: Yes Daily medical management of patient stability for increased activity during participation in an intensive rehabilitation regime.: Yes Daily analysis of laboratory values and/or radiology reports with any subsequent need for medication adjustment of medical intervention for : Neurological problems;Blood pressure problems  Elysa Womac, Silvestre Mesi 06/20/2017, 2:11 PM

## 2017-06-20 NOTE — Progress Notes (Signed)
Occupational Therapy Session Note  Patient Details  Name: Shelley [name] MRN: [id number] Date of Birth: [birth year]  Today's Date: 06/20/2017 OT Individual Time: 1105-1205 OT Individual Time Calculation (min): 60 min    Short Term Goals: Week 1:  OT Short Term Goal 1 (Week 1): Pt will complete toilet transfer with min A OT Short Term Goal 2 (Week 1): Pt will stand to complete 2 grooming tasks in order to increase functional activity tolerance OT Short Term Goal 3 (Week 1): Pt will bathe with min A OT Short Term Goal 4 (Week 1): Pt will don UB clothing with VCs  Skilled Therapeutic Interventions/Progress Updates:    Pt seen for OT ADL bathing/dressing session. Pt in supine upon arrival and with encouragement agreeable to tx session. She transferred to EOB with supervision and required significantly increased time and encouragement before standing to being functional tasks. She ambulated with overall guarding assist, however, required VCs not to use furnitire to lean on when walking and manual assist for RW management during functional ambulation/ transfers and turns.  She bathed seated on tub bench in shower, VCs for redicretion to task as she would begin to perseverate on various things. She returned to toilet to dress, threaded items with distant supervision in order to not distract pt. Pt then with continent BM, assist provided for thorough hygiene. Stoood at [employer] with guarding assist to pull pants up. She ambulated out of bathroom and completed hand hygiene standing at sink.  She returned to w/c and  left seated  at end of session, all needs in reach and awaiting lunch tray   Therapy Documentation Precautions:  Precautions Precautions: Fall Restrictions Weight Bearing Restrictions: No Pain:   No/ denies pain  See Function Navigator for Current Functional Status.   Therapy/Group: Individual Therapy  Verdis Koval L 06/20/2017, 7:14 AM

## 2017-06-20 NOTE — Progress Notes (Signed)
Physical Therapy Session Note  Patient Details  Name: TACORI KVAMME MRN: [id number] Date of Birth: January 26, 1946  Today's Date: 06/20/2017 PT Individual Time: 0935-1003 and 1300-1400 PT Individual Time Calculation (min): 28 min and 60 min  Short Term Goals: Week 1:  PT Short Term Goal 1 (Week 1): Pt will be able to peform functional transfers with min assist  PT Short Term Goal 2 (Week 1): Pt will be able to gait x 25' with mod assist with LRAD PT Short Term Goal 3 (Week 1): Pt will be able to perform 3 steps with mod assist to prepare for home entry re-training  Skilled Therapeutic Interventions/Progress Updates: Tx1: Pt presented in w/c agreeable to therapy with encouragement. Pt transported to rehab gym for time management. Performed standing balance activities playing checkers at high/low table. Pt required mod cues for sustained task in minimally distracting environment. Pt able to maintain standing for periods up to 5 minutes. Pt handed off to SLP at end of session.   Tx2: Pt presented in w/c completing lunch with husband present. Pt transported to rehab gym total assist. Pt performed stand pivot transfer to NuStep min guard no AD. Performed NuStep for endurance and sustained activity x 5 min and x 3 min L1. Pt required max encouragement to continue participation on therapy. Pt participated in obstacle course x 22f weaving through cones and turning into w/c. Pt ambulated with min guard and required required min/mod cues and increased time for negotiation of cones.Pt returned to room and required max encouragement to remain in w/c after session. Pt left with husband present and needs met.      Therapy Documentation Precautions:  Precautions Precautions: Fall Restrictions Weight Bearing Restrictions: No   See Function Navigator for Current Functional Status.   Therapy/Group: Individual Therapy  Asmi Fugere  Doral Ventrella, PTA  06/20/2017, 12:45 PM

## 2017-06-20 NOTE — Progress Notes (Signed)
Speech Language Pathology Daily Session Note  Patient Details  Name: UNKNOWN FLANNIGAN MRN: [id number] Date of Birth: 09-Dec-1945  Today's Date: 06/20/2017 SLP Individual Time: 1000-1030 SLP Individual Time Calculation (min): 30 min  Short Term Goals: Week 1: SLP Short Term Goal 1 (Week 1): Pt will utilize compensatory memory strategies recall new daily activities with Min A cues.  SLP Short Term Goal 2 (Week 1): Pt will maintain current topic of activity or conversation with Mod A cues.  SLP Short Term Goal 3 (Week 1): Pt will demonstrate functional problem solving ability with Mod A cues.  SLP Short Term Goal 4 (Week 1): Pt will demonstrate intellectual awareness by listing 2 physical and 2 cognitive deficits related ot CVA with Mod A cues.  SLP Short Term Goal 5 (Week 1): In a distracting environment, pt will demonstrate sustained attention to tasks for ~ 30 minutes with Mod A cues.   Skilled Therapeutic Interventions: Skilled ST services focused on cognitive skills. SLP introduced memory notebook to aid in recall of daily and novel events. SLP facilitated recall of daily information, today's therapy events, utilizing memory notebook,pt demonstrated ability to recall today's events with supervision A question cues to utilize memory notebook. Self recording events in memory notebook might be unsuccessful due to pt being internally and externally distracted requiring max A verbal cues to sustain attention during witting at word level, although some attention deficits believed to be at baseline. Pt demonstrated emergent awareness, recognizing written errors with supervision A question cues and correcting errors with Mod A verbal cues. Pt was left in room with call bell within reach. Recommend to continue skilled ST services.      Function:  Eating Eating                 Cognition Comprehension Comprehension assist level: Understands basic 90% of the time/cues < 10% of the time  Expression    Expression assist level: Expresses basic needs/ideas: With no assist  Social Interaction Social Interaction assist level: Interacts appropriately 75 - 89% of the time - Needs redirection for appropriate language or to initiate interaction.  Problem Solving Problem solving assist level: Solves basic 50 - 74% of the time/requires cueing 25 - 49% of the time  Memory Memory assist level: Recognizes or recalls 25 - 49% of the time/requires cueing 50 - 75% of the time    Pain Pain Assessment Pain Assessment: No/denies pain Pain Score: 0-No pain  Therapy/Group: Individual Therapy  Chip Canepa  [facility] 06/20/2017, 3:08 PM

## 2017-06-20 NOTE — Progress Notes (Signed)
Physical Therapy Session Note  Patient Details  Name: Shelley [name] MRN: [id number] Date of Birth: 06/11/1946  Today's Date: 06/20/2017 PT Individual Time: 5146-0479 PT Individual Time Calculation (min): 45 min   Short Term Goals: Week 1:  PT Short Term Goal 1 (Week 1): Pt will be able to peform functional transfers with min assist  PT Short Term Goal 2 (Week 1): Pt will be able to gait x 25' with mod assist with LRAD PT Short Term Goal 3 (Week 1): Pt will be able to perform 3 steps with mod assist to prepare for home entry re-training  Skilled Therapeutic Interventions/Progress Updates:   Pt received semirecumbent in bed and agreeable to PT. PT offered to allow pt to finish breakfast sitting EOB or sitting up in [name], but pt reports that she has finished brakfast.   Come to sitting EOB with supervision assist and heavy use of rails. Sit<>stand to pull pants up at EOB with supervision assist and cues for UE placement.Pt able to pull pants up with supervision assist standing EOB with intermittent 1-0 UE support.  Gait to WC x 5 ft with supervision assist and max cues for safety .Pt transpored to rehab gym. PT instructed pt inGait with RW x 55f. Multiple standing rest breaks due to inability to multitask walking and talking. Standing tolerance/balance to organize self in rehab gym2 x 3 minutes with lateral and cross body reaches. Min cues for redirection to remain on task  [facility] and all needs met.        Therapy Documentation Precautions:  Precautions Precautions: Fall Restrictions Weight Bearing Restrictions: No General:   Vital Signs:   Pain:   Mobility:   Locomotion :    Trunk/Postural Assessment :    Balance:   Exercises:   Other Treatments:     See Function Navigator for Current Functional Status.   Therapy/Group: Individual Therapy  [name]12/19/2018, 8:48 AM

## 2017-06-21 ENCOUNTER — Inpatient Hospital Stay (HOSPITAL_COMMUNITY): Payer: Medicare Other | Admitting: Speech Pathology

## 2017-06-21 ENCOUNTER — Inpatient Hospital Stay (HOSPITAL_COMMUNITY): Payer: Medicare Other | Admitting: Occupational Therapy

## 2017-06-21 ENCOUNTER — Inpatient Hospital Stay (HOSPITAL_COMMUNITY): Payer: Medicare Other | Admitting: Physical Therapy

## 2017-06-21 NOTE — Progress Notes (Signed)
Physical Therapy Weekly Progress Note  Patient Details  Name: Shelley [name] MRN: [id number] Date of Birth: 1946/06/09  Beginning of progress report period: June 13, 2017 End of progress report period: June 21, 2017  Today's Date: 06/21/2017 PT Individual Time: 0810-0900 PT Individual Time Calculation (min): 50 min   Patient has met 3 of 3 short term goals.  Pt making steady progress towards goals. Currently, pt able to perform bed mobility with supervision assist and use of bed features. Sit<>stand and stand pivot transfers with RW and supervision assist. PT able to ambulate up to 56f with min-supervision assist, but limited by lethargy and internal and external distractibility.   Patient continues to demonstrate the following deficits muscle weakness, decreased cardiorespiratoy endurance, impaired timing and sequencing and ataxia, decreased initiation, decreased attention, decreased awareness, decreased problem solving, decreased safety awareness, decreased memory and delayed processing and decreased sitting balance, decreased standing balance, decreased postural control, hemiplegia and decreased balance strategies and therefore will continue to benefit from skilled PT intervention to increase functional independence with mobility.  Patient progressing toward long term goals..  Continue plan of care.  PT Short Term Goals Week 1:  PT Short Term Goal 1 (Week 1): Pt will be able to peform functional transfers with min assist  PT Short Term Goal 1 - Progress (Week 1): Met PT Short Term Goal 2 (Week 1): Pt will be able to gait x 25' with mod assist with LRAD PT Short Term Goal 2 - Progress (Week 1): Met PT Short Term Goal 3 (Week 1): Pt will be able to perform 3 steps with mod assist to prepare for home entry re-training PT Short Term Goal 3 - Progress (Week 1): Met Week 2:  PT Short Term Goal 1 (Week 2): STG = LTG due to ELOS  Skilled Therapeutic Interventions/Progress Updates:    Pt received sitting EOB and agreeable to PT, but requesting to finish breakfast. PT returned after 10 min   [facility] Gait training x 530fwith RW and supervision assist overall from PT. Min cues for attention to task, AD management, and posture. Additional gait training x 3024fith shopping cart and superivison assist from PT. Pt noted to have increased gait speed and normalized gait pattern with shopping cart compared to RW.  Standing balance/tolerance to  Throw ball into basketball hoop. Supervision assist to maintain balance. Pt able to particpate in task for 3 minute prior to requesting to sit. Stair negotiation training x 4 steps with supervision-min assist. Moderate cues from PT for step to gait pattern, improved posture and proper UE placement. [facility] returned to [facility] and all needs met.         Therapy Documentation Precautions:  Precautions Precautions: Fall Restrictions Weight Bearing Restrictions: No Pain: 0/10  General: missed time: 10 min: pt eating.   See Function Navigator for Current Functional Status.  Therapy/Group: Individual Therapy  AusLorie Phenix/20/2018, 9:00 AM

## 2017-06-21 NOTE — Progress Notes (Signed)
Occupational Therapy Weekly Progress Note  Patient Details  Name: Shelley [name] MRN: [id number] Date of Birth: [birth year]  Beginning of progress report period: June 14, 2017 End of progress report period: June 21, 2017  Today's Date: 06/21/2017 OT Individual Time: 0945-1100 OT Individual Time Calculation (min): 75 min    Patient has met 4 of 4 short term goals.  Pt is making slow but steady progress towards OT goals. She requires significantly increased time and encouragement for participation in all tasks. She is limited by impaired attention, requiring frequent cuing for redirection to task due to internal and external distractions. She also requires significantly increased time and rest breaks for all tasks due to decreased functional activity tolerance.  She is on track to meet supervision level goals.   Patient continues to demonstrate the following deficits:apraxia and muscle weakness (generalized) and therefore will continue to benefit from skilled OT intervention to enhance overall performance with BADL and Reduce care partner burden.  Patient progressing toward long term goals..  Continue plan of care.  OT Short Term Goals Week 1:  OT Short Term Goal 1 (Week 1): Pt will complete toilet transfer with min A OT Short Term Goal 1 - Progress (Week 1): Met OT Short Term Goal 2 (Week 1): Pt will stand to complete 2 grooming tasks in order to increase functional activity tolerance OT Short Term Goal 2 - Progress (Week 1): Met OT Short Term Goal 3 (Week 1): Pt will bathe with min A OT Short Term Goal 3 - Progress (Week 1): Met OT Short Term Goal 4 (Week 1): Pt will don UB clothing with VCs OT Short Term Goal 4 - Progress (Week 1): Met Week 2:  OT Short Term Goal 1 (Week 2): STG=LTG due to LOS  Skilled Therapeutic Interventions/Progress Updates:    Pt seen for OT Session focusing on ADL re-training and functional transfers. Pt sitting up in w/c upon arrival,l agreeable to tx  session.  She completed bathing/dressing from w/c level at sink with assist for set-up. Pt with slightly better attention to task today compared to previous sessions. She was ablet conversate with therapist about the holidays while also completing bathing/dressing.  In ADL apartment, she completed simulated tub/shower transfer utilizing tub transfer bench. Completed with guarding assist following demonstration and VCs for technique. Cont to require VCs for redirection to task even in the middle of a transfer.  She then ambulated out of bathroom to couch and completed sit <> stand from low soft surface couch with close supervision and VCs for hand placement on RW. Following extended rest break, she ambulated into kitchen and practiced kitchen accessibility with multimodal cuing for  RW management in functional context.  Pt returned to room at end of session and with encouragement willing to stay sitting up in recliner. Left with all needs in reach.   During session, discussed pt's need for 24 hr supervision assist at d/c. Pt reports her husband works and is not always there, she was unsure of who they have planned to cover 24 hr supervision. Education provided regarding pt's high fall risk and impaired cognition including functional implications and need for assist at d/c.    Therapy Documentation Precautions:  Precautions Precautions: Fall Restrictions Weight Bearing Restrictions: No Pain:   No/ denies pain  See Function Navigator for Current Functional Status.   Therapy/Group: Individual Therapy  Kinnie Kaupp L 06/21/2017, 7:10 AM

## 2017-06-21 NOTE — Progress Notes (Signed)
Speech Language Pathology Weekly Progress and Session Note  Patient Details  Name: Shelley [name] MRN: [id number] Date of Birth: 08/23/1945  Beginning of progress report period: June 14, 2017 End of progress report period: June 21, 2017  Today's Date: 06/21/2017 SLP Individual Time: 1300-1355 SLP Individual Time Calculation (min): 55 min  Short Term Goals: Week 1: SLP Short Term Goal 1 (Week 1): Pt will utilize compensatory memory strategies recall new daily activities with Min A cues.  SLP Short Term Goal 1 - Progress (Week 1): Not met SLP Short Term Goal 2 (Week 1): Pt will maintain current topic of activity or conversation with Mod A cues.  SLP Short Term Goal 2 - Progress (Week 1): Met SLP Short Term Goal 3 (Week 1): Pt will demonstrate functional problem solving ability with Mod A cues.  SLP Short Term Goal 3 - Progress (Week 1): Met SLP Short Term Goal 4 (Week 1): Pt will demonstrate intellectual awareness by listing 2 physical and 2 cognitive deficits related ot CVA with Mod A cues.  SLP Short Term Goal 4 - Progress (Week 1): Met SLP Short Term Goal 5 (Week 1): In a distracting environment, pt will demonstrate sustained attention to tasks for ~ 30 minutes with Mod A cues.  SLP Short Term Goal 5 - Progress (Week 1): Met    New Short Term Goals: Week 2: SLP Short Term Goal 1 (Week 2): Patient will demonstrate selective attention in a mildy distracting enviornment for ~30 minutes with Min A verbal cues for redirection.  SLP Short Term Goal 2 (Week 2): Patient will utilize external aids to recall orientation information with Mod A verbal cues.  SLP Short Term Goal 3 (Week 2): Pt will demonstrate functional problem with functional and familiar tasks with Min A verbal cues.  SLP Short Term Goal 4 (Week 2): Pt will maintain current topic of conversation for ~3 turn with Min A verbal cues.  SLP Short Term Goal 5 (Week 2): Patient will self-monitor and correct errors during  functional tasks with Mod A verbal cues.   Weekly Progress Updates: Patient has made functional gains and has met 4 of 5 STG's this reporting period. Currently, patient requires overall Mod A verbal cues for problem solving, intellectual awareness and attention and Max A verbal cues for recall of information with use of external aids. Patient and family education is ongoing and patient would benefit from continued skilled SLP intervention to maximize her cognitive function and overall functional independence prior to discharge. Patient's progress may be limited by prior level of functioning and baseline memory impairments per family report.      Intensity: Minumum of 1-2 x/day, 30 to 90 minutes Frequency: 3 to 5 out of 7 days Duration/Length of Stay: 12/28 Treatment/Interventions: Functional tasks;Patient/family education;Therapeutic Activities;Cognitive remediation/compensation;Cueing hierarchy;Environmental controls;Internal/external aids   Daily Session  Skilled Therapeutic Interventions: Skilled treatment session focused on cognitive goals. SLP facilitated session by initiating her memory notebook to record basic orientation information and a timeline of recent events to maximize recall of information. A calendar was also provided to maximize recall of date. Patient was able to recall functional information with Mod-Max A verbal and question cues for utilization of external aids. Patient demonstrated emergent awareness of confusion and made several attempts throughout the session to self-correct errors. Patient left upright in recliner with all needs within reach, quick release belt in place and brother present. Continue with current plan of care.        Function:  Cognition Comprehension Comprehension assist level: Understands basic 90% of the time/cues < 10% of the time  Expression   Expression assist level: Expresses basic needs/ideas: With no assist  Social Interaction Social  Interaction assist level: Interacts appropriately 75 - 89% of the time - Needs redirection for appropriate language or to initiate interaction.  Problem Solving Problem solving assist level: Solves basic 50 - 74% of the time/requires cueing 25 - 49% of the time  Memory Memory assist level: Recognizes or recalls 25 - 49% of the time/requires cueing 50 - 75% of the time   Pain No/Denies Pain   Therapy/Group: Individual Therapy  Beckem Tomberlin 06/21/2017, 2:47 PM

## 2017-06-21 NOTE — Progress Notes (Signed)
Subjective/Complaints: No new issues. "i'm ready to go home". Slept ok  ROS: pt denies nausea, vomiting, diarrhea, cough, shortness of breath or chest pain   Objective: Vital Signs: Blood pressure 126/87, pulse 71, temperature 98 F (36.7 C), temperature source Oral, resp. rate 18, height 5' 5" (1.651 m), weight 71 kg (156 lb 8.4 oz), SpO2 98 %. No results found. Results for orders placed or performed during the hospital encounter of 06/13/17 (from the past 72 hour(s))  Creatinine, serum     Status: None   Collection Time: 06/20/17  5:31 AM  Result Value Ref Range   Creatinine, Ser 0.72 0.44 - 1.00 mg/dL   GFR calc non Af Amer >60 >60 mL/min   GFR calc Af Amer >60 >60 mL/min    Comment: (NOTE) The eGFR has been calculated using the CKD EPI equation. This calculation has not been validated in all clinical situations. eGFR's persistently <60 mL/min signify possible Chronic Kidney Disease.       General: No acute distress Mood and affect are appropriate Heart: RRR without murmur. No JVD    Lungs: CTA Bilaterally without wheezes or rales. Normal effort  Abdomen: BS+  Extremities: No clubbing, cyanosis, or edema Skin: No evidence of breakdown, no evidence of rash Neurologic: Cranial nerves II through XII intact, motor strength is 4/5 in bilateral deltoid, bicep, tricep, grip, hip flexor, knee extensors, ankle dorsiflexor and plantar flexor Fair insight and awareness Musculoskeletal: Full range of motion in all 4 extremities. No joint swelling Psych: Appropriate affect for the most part  Assessment/Plan: 1. 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 Physiatrist is providing close team supervision and 24 hour management of active medical problems listed below. Physiatrist and rehab team  continue to assess barriers to discharge/monitor patient progress toward functional and medical goals. FIM: Function - Bathing Position: Shower Body parts bathed by patient: Right arm, Left arm, Abdomen, Chest, Front perineal area, Buttocks, Right upper leg, Left upper leg, Right lower leg, Left lower leg, Back Body parts bathed by helper: Chest, Front perineal area, Buttocks, Right upper leg, Left upper leg, Right lower leg, Left lower leg, Back Assist Level: Touching or steadying assistance(Pt > 75%)  Function- Upper Body Dressing/Undressing What is the patient wearing?: Pull over shirt/dress Pull over shirt/dress - Perfomed by patient: Thread/unthread right sleeve, Put head through opening, Thread/unthread left sleeve, Pull shirt over trunk Button up shirt - Perfomed by patient: Thread/unthread right sleeve, Thread/unthread left sleeve, Pull shirt around back Button up shirt - Perfomed by helper: Button/unbutton shirt Assist Level: Set up Set up : To obtain clothing/put away Function - Lower Body Dressing/Undressing What is the patient wearing?: Pants, Shoes Position: Other (comment)(sitting on toilet) Pants- Performed by patient: Thread/unthread right pants leg, Thread/unthread left pants leg, Pull pants up/down Pants- Performed by helper: Thread/unthread right pants leg, Pull pants up/down Non-skid slipper socks- Performed by patient: Don/doff right sock, Don/doff left sock Non-skid slipper socks- Performed by helper: Don/doff right sock, Don/doff left sock Shoes - Performed by patient: Don/doff right shoe, Don/doff left shoe(slide on) Assist for footwear: Supervision/touching assist Assist for lower body dressing: Touching or steadying assistance (Pt > 75%)  Function - Toileting Toileting activity did not occur: No continent bowel/bladder event Toileting steps completed by patient: Adjust clothing prior to toileting, Performs perineal hygiene, Adjust   clothing after  toileting Toileting steps completed by helper: Adjust clothing prior to toileting, Performs perineal hygiene, Adjust clothing after toileting Toileting Assistive Devices: Grab bar or rail Assist level: Touching or steadying assistance (Pt.75%)  Function - Toilet Transfers Toilet transfer assistive device: Elevated toilet seat/BSC over toilet, Grab bar Assist level to toilet: Touching or steadying assistance (Pt > 75%) Assist level from toilet: Touching or steadying assistance (Pt > 75%)  Function - Chair/bed transfer Chair/bed transfer method: Stand pivot Chair/bed transfer assist level: Supervision or verbal cues Chair/bed transfer assistive device: Walker Chair/bed transfer details: Verbal cues for safe use of DME/AE, Verbal cues for precautions/safety  Function - Locomotion: Wheelchair Will patient use wheelchair at discharge?: Yes(tbd) Type: Manual Max wheelchair distance: 35 ft Assist Level: Touching or steadying assistance (Pt > 75%) Wheel 50 feet with 2 turns activity did not occur: Safety/medical concerns Wheel 150 feet activity did not occur: Safety/medical concerns Turns around,maneuvers to table,bed, and toilet,negotiates 3% grade,maneuvers on rugs and over doorsills: No Function - Locomotion: Ambulation Assistive device: Walker-rolling Max distance: 76f  Assist level: Touching or steadying assistance (Pt > 75%) Assist level: Touching or steadying assistance (Pt > 75%) Walk 50 feet with 2 turns activity did not occur: Safety/medical concerns Walk 150 feet activity did not occur: Safety/medical concerns Walk 10 feet on uneven surfaces activity did not occur: Safety/medical concerns  Function - Comprehension Comprehension: Auditory Comprehension assist level: Understands basic 90% of the time/cues < 10% of the time  Function - Expression Expression: Verbal Expression assist level: Expresses basic needs/ideas: With no assist  Function - Social Interaction Social  Interaction assist level: Interacts appropriately 75 - 89% of the time - Needs redirection for appropriate language or to initiate interaction.  Function - Problem Solving Problem solving assist level: Solves basic 50 - 74% of the time/requires cueing 25 - 49% of the time  Function - Memory Memory assist level: Recognizes or recalls 25 - 49% of the time/requires cueing 50 - 75% of the time Patient normally able to recall (first 3 days only): Current season, That he or she is in a hospital(With repetition)  Medical Problem List and Plan: 1.  Diplopia with decreased functional mobility  secondary to acute lacunar infarct in the left dorsal pons and right cerebellum as well as right thalamus  CIR PT, OT,SLP--ELOS 12/28 2.  DVT Prophylaxis/Anticoagulation: Subcutaneous Lovenox.    -Continue Lovenox 40 mg daily 3. Pain Management: Tylenol as needed  4. Mood: Provide emotional support  5. Neuropsych: This patient is  capable of making decisions on her  own behalf.     -memory deficits at times 6. Skin/Wound Care: Routine skin checks  7. Fluids/Electrolytes/Nutrition: Routine I&O's will follow up bmet 8. Seizure disorder. Keppra 500 mg twice a day with no seizures since admission to rehabilitation 9. Hypertension. Lopressor 25 mg twice a day, lisinopril 10 mg daily.  Blood pressure under control Vitals:   06/20/17 1456 06/21/17 0433  BP: 113/82 126/87  Pulse: 74 71  Resp: 18 18  Temp: (!) 97.5 F (36.4 C) 98 F (36.7 C)  SpO2: 99% 98%      LOS (Days) 8 A FACE TO FACE EVALUATION WAS PERFORMED  Vernell Back T 06/21/2017, 9:50 AM

## 2017-06-22 ENCOUNTER — Inpatient Hospital Stay (HOSPITAL_COMMUNITY): Payer: Medicare Other | Admitting: Speech Pathology

## 2017-06-22 ENCOUNTER — Inpatient Hospital Stay (HOSPITAL_COMMUNITY): Payer: Medicare Other | Admitting: Physical Therapy

## 2017-06-22 ENCOUNTER — Inpatient Hospital Stay (HOSPITAL_COMMUNITY): Payer: Medicare Other | Admitting: Occupational Therapy

## 2017-06-22 NOTE — Progress Notes (Signed)
Physical Therapy Session Note  Patient Details  Name: Shelley [name] MRN: [id number] Date of Birth: 16-Aug-1945   Today's Date: 06/22/2017 PT Individual Time:  1115-1200 AND 1545-1630   45 min and 45 min   Short Term Goals: Week 2:  PT Short Term Goal 1 (Week 2): STG = LTG due to ELOS  Skilled Therapeutic Interventions/Progress Updates:  session 1 Pt received supine in bed and agreeable to PT. Supine>sit transfer with supervision assist and moderate cues for participation in therapy.  Stand pivot transfer to [facility] with supervision assist and RW. Min cues for UE placement. Pt transported to gift shop of hospital. Gait training x 10f with RW through gift shop to sit at table in food court. Supervision assist to sit. Min assist stand from low seat and no Arm rests. Pt transported back to room. Stand pivot transfer to sit EOB with RW support. Pt left sitting EOB with table in place, and call bell in reach.   Session 2.  Pt received supine in bed and agreeable to PT. Supine>sit transfer withsupervisoin assist. [facility] min cues for AD management. Pt transported to rehab gym. Seated BLE therex. HS curls, LAQ, hip abduction, ankle PF, all complete x 8 BLE with level 2 tband.  Gait training with RW x 771fwith supervision assist and only min cues for step height and continued attention to task. [facility] and all needs met.            Therapy Documentation Precautions:  Precautions Precautions: Fall Restrictions Weight Bearing Restrictions: No Vital Signs: Therapy Vitals Temp: 97.8 F (36.6 C) Temp Source: Oral Pulse Rate: 60 Resp: 17 BP: 125/72 Patient Position (if appropriate): Lying Oxygen Therapy SpO2: 96 % O2 Device: Not Delivered Pain: Pain Assessment Pain Score: 0-No pain See Function Navigator for Current Functional Status.   Therapy/Group: Individual  Therapy  AuLorie Phenix2/21/2018, 8:00 AM

## 2017-06-22 NOTE — Progress Notes (Signed)
Speech Language Pathology Daily Session Note  Patient Details  Name: Shelley [name] MRN: [id number] Date of Birth: 05/18/1946  Today's Date: 06/22/2017 SLP Individual Time: 2585-2778 SLP Individual Time Calculation (min): 55 min  Short Term Goals: Week 2: SLP Short Term Goal 1 (Week 2): Patient will demonstrate selective attention in a mildy distracting enviornment for ~30 minutes with Min A verbal cues for redirection.  SLP Short Term Goal 2 (Week 2): Patient will utilize external aids to recall orientation information with Mod A verbal cues.  SLP Short Term Goal 3 (Week 2): Pt will demonstrate functional problem with functional and familiar tasks with Min A verbal cues.  SLP Short Term Goal 4 (Week 2): Pt will maintain current topic of conversation for ~3 turn with Min A verbal cues.  SLP Short Term Goal 5 (Week 2): Patient will self-monitor and correct errors during functional tasks with Mod A verbal cues.   Skilled Therapeutic Interventions: Skilled treatment session focused on cognitive goals. SLP facilitated session by providing Mod A verbal cues for alternating attention between self-feeding and functional conversation for ~20 minutes with Mod-Max A verbal cues needed for use of external aids to recall basic orientation information. Patient also required Mod A verbal cues for problem solving in regards to utilizing call bell to request pain medication for headache and for donning adhesive onto dentures. Patient left supine in bed with all needs within reach and alarm on. Continue with current plan of care.      Function:   Cognition Comprehension Comprehension assist level: Understands basic 90% of the time/cues < 10% of the time  Expression   Expression assist level: Expresses basic needs/ideas: With no assist  Social Interaction Social Interaction assist level: Interacts appropriately 75 - 89% of the time - Needs redirection for appropriate language or to initiate interaction.   Problem Solving Problem solving assist level: Solves basic 50 - 74% of the time/requires cueing 25 - 49% of the time  Memory Memory assist level: Recognizes or recalls 25 - 49% of the time/requires cueing 50 - 75% of the time    Pain Pain Assessment Pain Score: 0-No pain  Therapy/Group: Individual Therapy  Shelley [name] 06/22/2017, 8:44 AM

## 2017-06-22 NOTE — Progress Notes (Signed)
Occupational Therapy Session Note  Patient Details  Name: Shelley [name] MRN: [id number] Date of Birth: [birth year]  Today's Date: 06/22/2017 OT Individual Time: 0900-1000 OT Individual Time Calculation (min): 60 min    Short Term Goals: Week 2:  OT Short Term Goal 1 (Week 2): STG=LTG due to LOS  Skilled Therapeutic Interventions/Progress Updates:    Pt seen for OT ADL bathing/dressing session. Pt in supine upon arrival, agreeable to tx session. She moved throughout session with RW and supervision- guarding assist. She gathered clothing from low drawers in prep for shower. Bathed seated on tub bench, standing with support of grab bars to complete pericare/ buttock hygiene, VCs throughout for attention to task and to address all body parts, she refused washing of hair. She exited shower and returned to toilet, requiring VCs and manual assist for RW management in functional context and to prevent sitting prematurely. She dressed seated on toilet, assist for button up shirt for time management, guarding assist for standing clothing management.  She returned to w/c at end of session, left set-up with all needs in reach.   Therapy Documentation Precautions:  Precautions Precautions: Fall Restrictions Weight Bearing Restrictions: No Pain: Pain Assessment Pain Score: 0-No pain  See Function Navigator for Current Functional Status.   Therapy/Group: Individual Therapy  Lakera Viall L 06/22/2017, 6:27 AM

## 2017-06-22 NOTE — Progress Notes (Signed)
Social Work Patient ID: Shelley [name], female   DOB: 05/23/1946, 71 y.o.   MRN: [id number]   CSW met with pt and husband to update them on team conference discussion and targeted d/c date of 06-29-17.  Pt thinks this sounds like a long time away, but husband wants her to keep getting stronger and for them to go out together again at some point.  CSW will continue to follow and assist as needed.

## 2017-06-23 ENCOUNTER — Inpatient Hospital Stay (HOSPITAL_COMMUNITY): Payer: Medicare Other | Admitting: Occupational Therapy

## 2017-06-23 DIAGNOSIS — I10 Essential (primary) hypertension: Secondary | ICD-10-CM

## 2017-06-23 DIAGNOSIS — R569 Unspecified convulsions: Secondary | ICD-10-CM

## 2017-06-23 NOTE — Progress Notes (Signed)
Occupational Therapy Session Note  Patient Details  Name: Shelley [name] MRN: [id number] Date of Birth: 07/12/1945  Today's Date: 06/23/2017 OT Individual Time: 1400-1430 OT Individual Time Calculation (min): 30 min    Short Term Goals: Week 2:  OT Short Term Goal 1 (Week 2): STG=LTG due to LOS  Skilled Therapeutic Interventions/Progress Updates:    Pt seen for OT session focusing on functional transfers and family education. Pt in supine upon arrival with husband present and with encouragement, agreeable to tx session. Throughout session, required significantly increased time, encouragement, and VCs for redirection to task in order to complete any mobility. She ambulated throughout session with close supervision, completing sit<> stands with supervision and VCs for hand placement and RW management. In ADL apartment, she ambulated short distance into bathroom and completed simulated tub/shower transfer utilizing tub bench in simulation of home environment, completed with supervision/ VCs. [name] made aware of proper set-up and use of bench. Recommend "dry run" of transfer at d/c prior to actual showering task, he voiced understanding. Pt returned to room at end of session encouraged to stay sitting up in w/c in order to build OOB tolerance. LEft with husband present. Education provided throughout session regarding DME, importance of participation and independence with ADLs, and d/c planning.   Therapy Documentation Precautions:  Precautions Precautions: Fall Restrictions Weight Bearing Restrictions: No Pain: Pain Assessment Pain Score: 0-No pain   See Function Navigator for Current Functional Status.   Therapy/Group: Individual Therapy  Lahoma Constantin L 06/23/2017, 6:48 AM

## 2017-06-23 NOTE — Progress Notes (Signed)
Occupational Therapy Session Note  Patient Details  Name: Shelley [name] MRN: [id number] Date of Birth: 03-Jul-1946  Today's Date: 06/23/2017 OT Individual Time: 1001-1044 OT Individual Time Calculation (min): 43 min    Short Term Goals: Week 2:  OT Short Term Goal 1 (Week 2): STG=LTG due to LOS  Skilled Therapeutic Interventions/Progress Updates:    Pt resting semi-supine in bed upon OT arrival and was initially resistant to participating in session.  Encouragement and education provided, pt agreed to participate with max encouragement.  Pt transitioned to sitting EOB and doffed/donned shirt with setup of supplies and SBA.  Pt threaded pants with significantly increased time and close SBA/min guard during standing portion of task with RW.  Pt donned slip-on shoes with setup of supplies.  Pt stood and ambulated to bathroom with RW and min guard to complete toileting with A for perineal care.  Notified NA of strong urine odor noticed in brief and during continent episode on commode.  Pt ambulated to sink with RW and min guard to complete hand hygiene from standing.  Pt returned to sitting position in w/c with SBA to complete hair care.  Throughout session, pt required significantly increased time to complete tasks d/t increased distractibility and tangential thoughts with increased time to process information.  Pt could not recall which button to press for nursing assistance, pt re-oriented to call light.  Pt left resting in w/c with QRB in place and all needs in reach upon OT departure.  Therapy Documentation Precautions:  Precautions Precautions: Fall Restrictions Weight Bearing Restrictions: No Vital Signs: Therapy Vitals Pulse Rate: (!) 53 BP: 120/79 Pain: Pain Assessment Pain Assessment: No/denies pain  See Function Navigator for Current Functional Status.   Therapy/Group: Individual Therapy  Marcella Dubs 06/23/2017, 12:17 PM

## 2017-06-23 NOTE — Progress Notes (Signed)
Subjective/Complaints: Patient seen lying in bed this morning. She states she did not sleep very well last night, but is unsure why.  ROS: Denies nausea, vomiting, diarrhea, shortness of breath or chest pain   Objective: Vital Signs: Blood pressure 120/79, pulse (!) 53, temperature 98 F (36.7 C), temperature source Oral, resp. rate 18, height 5\' 5"  (1.651 m), weight 71 kg (156 lb 8.4 oz), SpO2 94 %. No results found. No results found for this or any previous visit (from the past 72 hour(s)).    General: No acute distress. Vital signs reviewed. Psych: Mood and affect are normal Heart: RRR. No JVD    Lungs: CTA Bilaterally. Normal effort  Abdomen: BS+. Nondistended  Musculoskeletal: No edema. No tenderness. Skin: No evidence of breakdown, no evidence of rash Neurologic:  Motor: 4-4+/5 in bilateral deltoid, bicep, tricep, grip, hip flexor, knee extensors, ankle dorsiflexor and plantar flexor Fair insight and awareness  Assessment/Plan: 1. 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 Physiatrist is providing close team supervision and 24 hour management of active medical problems listed below. Physiatrist and rehab team continue to assess barriers to discharge/monitor patient progress toward functional and medical goals. FIM: Function - Bathing Position: Shower Body parts bathed by patient: Right arm, Left arm, Abdomen, Chest, Front perineal area, Buttocks, Right upper leg, Left upper leg, Right lower leg, Left lower leg Body parts bathed by helper: Back Assist Level: Touching or steadying assistance(Pt > 75%)  Function- Upper Body Dressing/Undressing What is the patient wearing?: Button up shirt Pull over shirt/dress - Perfomed by patient: Thread/unthread right sleeve, Put head through opening,  Thread/unthread left sleeve, Pull shirt over trunk Button up shirt - Perfomed by patient: Thread/unthread right sleeve, Thread/unthread left sleeve, Pull shirt around back Button up shirt - Perfomed by helper: Button/unbutton shirt Assist Level: Set up Set up : To obtain clothing/put away Function - Lower Body Dressing/Undressing What is the patient wearing?: Pants, Shoes Position: Other (comment)(sitting on toilet) Pants- Performed by patient: Thread/unthread right pants leg, Thread/unthread left pants leg, Pull pants up/down Pants- Performed by helper: Thread/unthread right pants leg, Pull pants up/down Non-skid slipper socks- Performed by patient: Don/doff right sock, Don/doff left sock Non-skid slipper socks- Performed by helper: Don/doff right sock, Don/doff left sock Shoes - Performed by patient: Don/doff right shoe, Don/doff left shoe(slide on) Assist for footwear: Supervision/touching assist Assist for lower body dressing: Touching or steadying assistance (Pt > 75%)  Function - Toileting Toileting activity did not occur: No continent bowel/bladder event Toileting steps completed by patient: Adjust clothing prior to toileting, Performs perineal hygiene, Adjust clothing after toileting Toileting steps completed by helper: Adjust clothing prior to toileting, Performs perineal hygiene, Adjust clothing after toileting Toileting Assistive Devices: Grab bar or rail Assist level: Touching or steadying assistance (Pt.75%)  Function - Air cabin crew transfer assistive device: Elevated toilet seat/BSC over toilet, Grab bar Assist level to toilet: Touching or steadying assistance (Pt > 75%) Assist level from toilet: Touching or steadying assistance (Pt > 75%)  Function - Chair/bed transfer Chair/bed transfer method: Stand pivot Chair/bed transfer assist level: Supervision or verbal cues Chair/bed transfer assistive device: Walker Chair/bed transfer details: Verbal cues for safe  use of DME/AE, Verbal cues for precautions/safety  Function - Locomotion: Wheelchair Will patient use wheelchair at discharge?: Yes(tbd) Type: Manual Max wheelchair  distance: 35 ft Assist Level: Touching or steadying assistance (Pt > 75%) Wheel 50 feet with 2 turns activity did not occur: Safety/medical concerns Wheel 150 feet activity did not occur: Safety/medical concerns Turns around,maneuvers to table,bed, and toilet,negotiates 3% grade,maneuvers on rugs and over doorsills: No Function - Locomotion: Ambulation Assistive device: Walker-rolling Max distance: 83ft Assist level: Supervision or verbal cues Assist level: Supervision or verbal cues Walk 50 feet with 2 turns activity did not occur: Safety/medical concerns Assist level: Supervision or verbal cues Walk 150 feet activity did not occur: Safety/medical concerns Walk 10 feet on uneven surfaces activity did not occur: Safety/medical concerns  Function - Comprehension Comprehension: Auditory Comprehension assist level: Understands basic 90% of the time/cues < 10% of the time  Function - Expression Expression: Verbal Expression assist level: Expresses basic needs/ideas: With no assist  Function - Social Interaction Social Interaction assist level: Interacts appropriately 75 - 89% of the time - Needs redirection for appropriate language or to initiate interaction.  Function - Problem Solving Problem solving assist level: Solves basic 50 - 74% of the time/requires cueing 25 - 49% of the time  Function - Memory Memory assist level: Recognizes or recalls 25 - 49% of the time/requires cueing 50 - 75% of the time Patient normally able to recall (first 3 days only): Current season, That he or she is in a hospital(With repetition)  Medical Problem List and Plan: 1.  Diplopia with decreased functional mobility  secondary to acute lacunar infarct in the left dorsal pons and right cerebellum as well as right thalamus    Cont CIR  2.   DVT Prophylaxis/Anticoagulation: Subcutaneous Lovenox.  3. Pain Management: Tylenol as needed  4. Mood: Provide emotional support  5. Neuropsych: This patient is  capable of making decisions on her  own behalf.     -memory deficits at times 6. Skin/Wound Care: Routine skin checks  7. Fluids/Electrolytes/Nutrition: Routine I&O's    BMP within acceptable range on 12/13   Labs ordered for Monday 8. Seizure disorder. Keppra 500 mg twice a day with no seizures since admission to rehabilitation 9. Hypertension. Lopressor 25 mg twice a day, lisinopril 10 mg daily.  Blood pressure under control Vitals:   06/23/17 0202 06/23/17 0944  BP: 140/78 120/79  Pulse: (!) 55 (!) 53  Resp: 18   Temp: 98 F (36.7 C)   SpO2: 94%     Relatively controlled on 12/22  LOS (Days) 10 A FACE TO FACE EVALUATION WAS PERFORMED  Shelley [name] [name] 06/23/2017, 11:55 AM

## 2017-06-23 NOTE — Plan of Care (Signed)
  Not Progressing RH BLADDER ELIMINATION RH STG MANAGE BLADDER WITH ASSISTANCE Description STG Manage Bladder With mod Assistance  06/23/2017 1049 - Not Progressing by Hillery Jacks, RN  LBM 12/19- Incontinent.

## 2017-06-23 NOTE — Plan of Care (Signed)
  Progressing RH SKIN INTEGRITY RH STG SKIN FREE OF INFECTION/BREAKDOWN 06/23/2017 2345 - Progressing by Edd Arbour, RN RH STG MAINTAIN SKIN INTEGRITY WITH ASSISTANCE Description STG Maintain Skin Integrity With min Assistance.  06/23/2017 2345 - Progressing by Edd Arbour, RN RH SAFETY RH STG ADHERE TO SAFETY PRECAUTIONS W/ASSISTANCE/DEVICE Description STG Adhere to Safety Precautions With min Assistance/Device.  06/23/2017 2345 - Progressing by Edd Arbour, RN

## 2017-06-24 NOTE — Progress Notes (Signed)
Subjective/Complaints: Pt seen laying in bed this seen AM.  She slept well overnight.  She is looking forward to her day of rest today.   ROS: Denies nausea, vomiting, diarrhea, shortness of breath or chest pain   Objective: Vital Signs: Blood pressure 125/76, pulse (!) 58, temperature 98.6 F (37 C), temperature source Oral, resp. rate 18, height 5\' 5"  (1.651 m), weight 71 kg (156 lb 8.4 oz), SpO2 95 %. No results found. No results found for this or any previous visit (from the past 72 hour(s)).   General: No acute distress. Vital signs reviewed. Psych: Mood and affect are normal Heart: RRR. No JVD    Lungs: CTA Bilaterally. Normal effort  Abdomen: BS+. Nondistended  Musculoskeletal: No edema. No tenderness. Skin: No evidence of breakdown, no evidence of rash Neurologic:  Motor: 4+/5 in bilateral deltoid, bicep, tricep, grip, hip flexor, knee extensors, ankle dorsiflexor and plantar flexor Fair insight and awareness  Assessment/Plan: 1. 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 Physiatrist is providing close team supervision and 24 hour management of active medical problems listed below. Physiatrist and rehab team continue to assess barriers to discharge/monitor patient progress toward functional and medical goals. FIM: Function - Bathing Position: Shower Body parts bathed by patient: Right arm, Left arm, Abdomen, Chest, Front perineal area, Buttocks, Right upper leg, Left upper leg, Right lower leg, Left lower leg Body parts bathed by helper: Back Assist Level: Touching or steadying assistance(Pt > 75%)  Function- Upper Body Dressing/Undressing What is the patient wearing?: Button up shirt Pull over shirt/dress - Perfomed by patient: Thread/unthread right sleeve, Put head through opening,  Thread/unthread left sleeve, Pull shirt over trunk Button up shirt - Perfomed by patient: Thread/unthread right sleeve, Thread/unthread left sleeve, Pull shirt around back Button up shirt - Perfomed by helper: Button/unbutton shirt Assist Level: Set up Set up : To obtain clothing/put away Function - Lower Body Dressing/Undressing What is the patient wearing?: Pants, Shoes Position: Other (comment)(sitting on toilet) Pants- Performed by patient: Thread/unthread right pants leg, Thread/unthread left pants leg, Pull pants up/down Pants- Performed by helper: Thread/unthread right pants leg, Pull pants up/down Non-skid slipper socks- Performed by patient: Don/doff right sock, Don/doff left sock Non-skid slipper socks- Performed by helper: Don/doff right sock, Don/doff left sock Shoes - Performed by patient: Don/doff right shoe, Don/doff left shoe(slide on) Assist for footwear: Supervision/touching assist Assist for lower body dressing: Touching or steadying assistance (Pt > 75%)  Function - Toileting Toileting activity did not occur: No continent bowel/bladder event Toileting steps completed by patient: Adjust clothing prior to toileting, Performs perineal hygiene, Adjust clothing after toileting Toileting steps completed by helper: Adjust clothing prior to toileting, Performs perineal hygiene, Adjust clothing after toileting Toileting Assistive Devices: Grab bar or rail Assist level: Touching or steadying assistance (Pt.75%)  Function - Air cabin crew transfer assistive device: Elevated toilet seat/BSC over toilet, Grab bar Assist level to toilet: Touching or steadying assistance (Pt > 75%) Assist level from toilet: Touching or steadying assistance (Pt > 75%)  Function - Chair/bed transfer Chair/bed transfer method: Stand pivot Chair/bed transfer assist level: Supervision or verbal cues Chair/bed transfer assistive device: Walker Chair/bed transfer details: Verbal cues for safe  use of DME/AE, Verbal cues for precautions/safety  Function - Locomotion: Wheelchair Will patient use wheelchair at discharge?: Yes(tbd) Type:  Manual Max wheelchair distance: 35 ft Assist Level: Touching or steadying assistance (Pt > 75%) Wheel 50 feet with 2 turns activity did not occur: Safety/medical concerns Wheel 150 feet activity did not occur: Safety/medical concerns Turns around,maneuvers to table,bed, and toilet,negotiates 3% grade,maneuvers on rugs and over doorsills: No Function - Locomotion: Ambulation Assistive device: Walker-rolling Max distance: 10ft Assist level: Supervision or verbal cues Assist level: Supervision or verbal cues Walk 50 feet with 2 turns activity did not occur: Safety/medical concerns Assist level: Supervision or verbal cues Walk 150 feet activity did not occur: Safety/medical concerns Walk 10 feet on uneven surfaces activity did not occur: Safety/medical concerns  Function - Comprehension Comprehension: Auditory Comprehension assist level: Understands basic 90% of the time/cues < 10% of the time  Function - Expression Expression: Verbal Expression assist level: Expresses basic needs/ideas: With no assist  Function - Social Interaction Social Interaction assist level: Interacts appropriately 75 - 89% of the time - Needs redirection for appropriate language or to initiate interaction.  Function - Problem Solving Problem solving assist level: Solves basic 50 - 74% of the time/requires cueing 25 - 49% of the time  Function - Memory Memory assist level: Recognizes or recalls 25 - 49% of the time/requires cueing 50 - 75% of the time Patient normally able to recall (first 3 days only): Current season, That he or she is in a hospital(With repetition)  Medical Problem List and Plan: 1.  Diplopia with decreased functional mobility  secondary to acute lacunar infarct in the left dorsal pons and right cerebellum as well as right thalamus    Cont CIR  2.   DVT Prophylaxis/Anticoagulation: Subcutaneous Lovenox.  3. Pain Management: Tylenol as needed  4. Mood: Provide emotional support  5. Neuropsych: This patient is  capable of making decisions on her  own behalf.     -memory deficits at times 6. Skin/Wound Care: Routine skin checks  7. Fluids/Electrolytes/Nutrition: Routine I&O's    BMP within acceptable range on 12/13   Labs ordered for tomorrow 8. Seizure disorder. Keppra 500 mg twice a day with no seizures since admission to rehabilitation 9. Hypertension. Lopressor 25 mg twice a day, lisinopril 10 mg daily.  Blood pressure under control Vitals:   06/24/17 1009 06/24/17 1409  BP: 115/75 125/76  Pulse: 60 (!) 58  Resp:  18  Temp:  98.6 F (37 C)  SpO2:  95%    Relatively controlled on 12/23  LOS (Days) 11 A FACE TO FACE EVALUATION WAS PERFORMED  Ankit [name] 06/24/2017, 3:16 PM

## 2017-06-24 NOTE — Plan of Care (Signed)
  Not Progressing RH BOWEL ELIMINATION RH STG MANAGE BOWEL WITH ASSISTANCE Description STG Manage Bowel with mod Assistance.   06/24/2017 1511 - Not Progressing by Hillery Jacks, RN  LBM 12/19- Sorbitol given at 1409

## 2017-06-25 ENCOUNTER — Inpatient Hospital Stay (HOSPITAL_COMMUNITY): Payer: Medicare Other | Admitting: Physical Therapy

## 2017-06-25 ENCOUNTER — Inpatient Hospital Stay (HOSPITAL_COMMUNITY): Payer: Medicare Other | Admitting: Occupational Therapy

## 2017-06-25 ENCOUNTER — Inpatient Hospital Stay (HOSPITAL_COMMUNITY): Payer: Medicare Other | Admitting: Speech Pathology

## 2017-06-25 DIAGNOSIS — I63541 Cerebral infarction due to unspecified occlusion or stenosis of right cerebellar artery: Secondary | ICD-10-CM

## 2017-06-25 LAB — CBC WITH DIFFERENTIAL/PLATELET
Basophils Absolute: 0 10*3/uL (ref 0.0–0.1)
Basophils Relative: 0 %
EOS PCT: 0 %
Eosinophils Absolute: 0 10*3/uL (ref 0.0–0.7)
HCT: 46.2 % — ABNORMAL HIGH (ref 36.0–46.0)
HEMOGLOBIN: 15.1 g/dL — AB (ref 12.0–15.0)
LYMPHS ABS: 1.9 10*3/uL (ref 0.7–4.0)
LYMPHS PCT: 28 %
MCH: 30.8 pg (ref 26.0–34.0)
MCHC: 32.7 g/dL (ref 30.0–36.0)
MCV: 94.1 fL (ref 78.0–100.0)
MONOS PCT: 5 %
Monocytes Absolute: 0.3 10*3/uL (ref 0.1–1.0)
Neutro Abs: 4.4 10*3/uL (ref 1.7–7.7)
Neutrophils Relative %: 67 %
PLATELETS: 228 10*3/uL (ref 150–400)
RBC: 4.91 MIL/uL (ref 3.87–5.11)
RDW: 13.9 % (ref 11.5–15.5)
WBC: 6.7 10*3/uL (ref 4.0–10.5)

## 2017-06-25 LAB — BASIC METABOLIC PANEL
Anion gap: 8 (ref 5–15)
BUN: 9 mg/dL (ref 6–20)
CHLORIDE: 105 mmol/L (ref 101–111)
CO2: 23 mmol/L (ref 22–32)
CREATININE: 0.75 mg/dL (ref 0.44–1.00)
Calcium: 8.9 mg/dL (ref 8.9–10.3)
GFR calc Af Amer: >60 mL/min (ref >60)
GFR calc non Af Amer: >60 mL/min (ref >60)
Glucose, Bld: 121 mg/dL — ABNORMAL HIGH (ref 65–99)
POTASSIUM: 3.6 mmol/L (ref 3.5–5.1)
SODIUM: 136 mmol/L (ref 135–145)

## 2017-06-25 NOTE — Plan of Care (Signed)
  Progressing RH BOWEL ELIMINATION RH STG MANAGE BOWEL WITH ASSISTANCE Description STG Manage Bowel with mod Assistance.   06/25/2017 0026 - Progressing by Edd Arbour, RN RH BLADDER ELIMINATION RH STG MANAGE BLADDER WITH ASSISTANCE Description STG Manage Bladder With mod Assistance  06/25/2017 0026 - Progressing by Edd Arbour, RN RH SAFETY RH STG ADHERE TO SAFETY PRECAUTIONS W/ASSISTANCE/DEVICE Description STG Adhere to Safety Precautions With min Assistance/Device.  06/25/2017 0026 - Progressing by Edd Arbour, RN

## 2017-06-25 NOTE — Progress Notes (Signed)
Subjective/Complaints: Visiting with husband, no other issues  ROS: Denies nausea, vomiting, diarrhea, shortness of breath or chest pain   Objective: Vital Signs: Blood pressure 127/76, pulse (!) 56, temperature 97.8 F (36.6 C), temperature source Oral, resp. rate 16, height 5\' 5"  (1.651 m), weight 71 kg (156 lb 8.4 oz), SpO2 95 %. No results found. No results found for this or any previous visit (from the past 72 hour(s)).   General: No acute distress. Vital signs reviewed. Psych: Mood and affect are normal Heart: RRR. No JVD    Lungs: CTA Bilaterally. Normal effort  Abdomen: BS+. Nondistended  Musculoskeletal: No edema. No tenderness. Skin: No evidence of breakdown, no evidence of rash Neurologic:  Motor: 4+/5 in bilateral deltoid, bicep, tricep, grip, hip flexor, knee extensors, ankle dorsiflexor and plantar flexor Fair insight and awareness  Assessment/Plan: 1. 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 Physiatrist is providing close team supervision and 24 hour management of active medical problems listed below. Physiatrist and rehab team continue to assess barriers to discharge/monitor patient progress toward functional and medical goals. FIM: Function - Bathing Position: Shower Body parts bathed by patient: Right arm, Left arm, Abdomen, Chest, Front perineal area, Buttocks, Right upper leg, Left upper leg, Right lower leg, Left lower leg, Back Body parts bathed by helper: Back Assist Level: Supervision or verbal cues  Function- Upper Body Dressing/Undressing What is the patient wearing?: Button up shirt Pull over shirt/dress - Perfomed by patient: Thread/unthread right sleeve, Put head through opening, Thread/unthread left sleeve, Pull shirt over trunk Button up shirt - Perfomed by  patient: Thread/unthread right sleeve, Thread/unthread left sleeve, Pull shirt around back Button up shirt - Perfomed by helper: Button/unbutton shirt Assist Level: Supervision or verbal cues Set up : To obtain clothing/put away Function - Lower Body Dressing/Undressing What is the patient wearing?: Pants, Non-skid slipper socks Position: Other (comment)(Sitting on toilet) Pants- Performed by patient: Thread/unthread right pants leg, Thread/unthread left pants leg, Pull pants up/down Pants- Performed by helper: Thread/unthread right pants leg, Pull pants up/down Non-skid slipper socks- Performed by patient: Don/doff right sock, Don/doff left sock Non-skid slipper socks- Performed by helper: Don/doff right sock, Don/doff left sock Shoes - Performed by patient: Don/doff right shoe, Don/doff left shoe(slide on) Assist for footwear: Independent Assist for lower body dressing: Supervision or verbal cues  Function - Toileting Toileting activity did not occur: No continent bowel/bladder event Toileting steps completed by patient: Adjust clothing prior to toileting, Performs perineal hygiene, Adjust clothing after toileting Toileting steps completed by helper: Adjust clothing prior to toileting, Performs perineal hygiene, Adjust clothing after toileting Toileting Assistive Devices: Grab bar or rail Assist level: Touching or steadying assistance (Pt.75%)  Function - Air cabin crew transfer assistive device: Elevated toilet seat/BSC over toilet, Grab bar Assist level to toilet: Touching or steadying assistance (Pt > 75%) Assist level from toilet: Touching or steadying assistance (Pt > 75%)  Function - Chair/bed transfer Chair/bed transfer method: Stand pivot Chair/bed transfer assist level: Touching or steadying assistance (Pt > 75%) Chair/bed transfer assistive device: Walker Chair/bed transfer details: Verbal cues for precautions/safety, Verbal cues for safe use of DME/AE  Function  - Locomotion: Wheelchair Will patient use wheelchair at discharge?: Yes(tbd) Type: Manual Max wheelchair distance: 35 ft Assist Level: Touching or steadying assistance (Pt > 75%) Wheel 50  feet with 2 turns activity did not occur: Safety/medical concerns Wheel 150 feet activity did not occur: Safety/medical concerns Turns around,maneuvers to table,bed, and toilet,negotiates 3% grade,maneuvers on rugs and over doorsills: No Function - Locomotion: Ambulation Assistive device: Walker-rolling Max distance: 30 ft Assist level: Touching or steadying assistance (Pt > 75%) Assist level: Touching or steadying assistance (Pt > 75%) Walk 50 feet with 2 turns activity did not occur: Safety/medical concerns Assist level: Supervision or verbal cues Walk 150 feet activity did not occur: Safety/medical concerns Walk 10 feet on uneven surfaces activity did not occur: Safety/medical concerns  Function - Comprehension Comprehension: Auditory Comprehension assist level: Understands basic 90% of the time/cues < 10% of the time  Function - Expression Expression: Verbal Expression assist level: Expresses basic needs/ideas: With no assist  Function - Social Interaction Social Interaction assist level: Interacts appropriately 75 - 89% of the time - Needs redirection for appropriate language or to initiate interaction.  Function - Problem Solving Problem solving assist level: Solves basic 50 - 74% of the time/requires cueing 25 - 49% of the time  Function - Memory Memory assist level: Recognizes or recalls 50 - 74% of the time/requires cueing 25 - 49% of the time Patient normally able to recall (first 3 days only): Current season, That he or she is in a hospital  Medical Problem List and Plan: 1.  Diplopia with decreased functional mobility  secondary to acute lacunar infarct in the left dorsal pons and right cerebellum as well as right thalamus    Cont CIR PT, OT, SLP 2.  DVT  Prophylaxis/Anticoagulation: Subcutaneous Lovenox.  3. Pain Management: Tylenol as needed  4. Mood: Provide emotional support  5. Neuropsych: This patient is  capable of making decisions on her  own behalf.     -memory deficits at times 6. Skin/Wound Care: Routine skin checks  7. Fluids/Electrolytes/Nutrition: Routine I&O's , Intake of fluids low   BMP within acceptable range on 12/13, recheck today given poor intake    8. Seizure disorder. Keppra 500 mg twice a day with no seizures since admission to rehabilitation 9. Hypertension. Lopressor 25 mg twice a day, lisinopril 10 mg daily.  Vitals:   06/25/17 0516 06/25/17 0931  BP: 127/76   Pulse: (!) 56 (!) 56  Resp: 16   Temp: 97.8 F (36.6 C)   SpO2: 95%     Relatively controlled on 12/24  LOS (Days) 12 A FACE TO FACE EVALUATION WAS PERFORMED  Charlett Blake 06/25/2017, 12:43 PM

## 2017-06-25 NOTE — Progress Notes (Signed)
Physical Therapy Session Note  Patient Details  Name: Shelley [name] MRN: [id number] Date of Birth: 05/15/1946  Today's Date: 06/25/2017 PT Individual Time: 1400-1445 PT Individual Time Calculation (min): 45 min   Short Term Goals: Week 2:  PT Short Term Goal 1 (Week 2): STG = LTG due to ELOS  Skilled Therapeutic Interventions/Progress Updates:    Pt seated in w/c finishing lunch, agreeable to participate in therapy session with some encouragement. Pt reports no pain this PM. Ambulation 2 x 50 ft with RW and CGA, v/c for upright posture. Pt exhibits improved tolerance for ambulation this PM, continues to amb with decreased gait speed and is easily distracted from task. Standing balance in // bars: Romberg stance, modified tandem stance 2 x 30 sec each with no UE support and CGA; side-steps with BUE support and v/c for upright posture. Assisted pt back to bed at end of therapy session, CGA for bed mobility. Pt left supine in bed with needs in reach, bed alarm in place.   Therapy Documentation Precautions:  Precautions Precautions: Fall Restrictions Weight Bearing Restrictions: No  See Function Navigator for Current Functional Status.   Therapy/Group: Individual Therapy  Excell Seltzer, PT, DPT  06/25/2017, 3:34 PM

## 2017-06-25 NOTE — Progress Notes (Signed)
Physical Therapy Session Note  Patient Details  Name: BRESHAY ILG MRN: [id number] Date of Birth: 10/24/1945  Today's Date: 06/25/2017 PT Individual Time: 0800-0900 PT Individual Time Calculation (min): 60 min   Short Term Goals: Week 2:  PT Short Term Goal 1 (Week 2): STG = LTG due to ELOS  Skilled Therapeutic Interventions/Progress Updates:    Pt seated EOB finishing breakfast, agreeable to participate in therapy session. Pt reports no pain this AM. Stand pivot transfer with CGA and use of RW for safety. Ambulation 2 x 30 ft with RW and Mod Assist for balance, seated rest break between bouts of ambulation. Pt ambulates with significantly decreased gait speed and requires max verbal cueing when turning to sit for safety and for RW management. Ascend/descend 4 stairs with 2 handrails and Mod Assist, v/c for step-to gait pattern. Seated BLE therex x 10 reps. Pt is easily distracted and requires frequent redirection to attend to therapy tasks. Pt left seated in w/c in room with quick-release belt in place, needs in reach.   Therapy Documentation Precautions:  Precautions Precautions: Fall Restrictions Weight Bearing Restrictions: No  See Function Navigator for Current Functional Status.   Therapy/Group: Individual Therapy  Excell Seltzer, PT, DPT  06/25/2017, 10:19 AM

## 2017-06-25 NOTE — Progress Notes (Signed)
Occupational Therapy Session Note  Patient Details  Name: Shelley [name] MRN: [id number] Date of Birth: 1946/01/01  Today's Date: 06/25/2017 OT Individual Time: 1100-1158 OT Individual Time Calculation (min): 58 min    Short Term Goals: Week 2:  OT Short Term Goal 1 (Week 2): STG=LTG due to LOS  Skilled Therapeutic Interventions/Progress Updates:    Pt seen for OT ADL bathing/dressing session. Pt sitting up in w/c upon arrival, voiced increased fatigue, however with encouragement willing to participate in bathing session. She ambulated throughout session with RW and supervision, requiring mod cuing for RW management specifically during turns as she would leave it out behind. She gathered clothing items from low drawers with guarding assist. She required significantly increased time and min cuing for redirection to task during functional activities, easily distracted by internal and external factors. She bathed seated on tub bench then returned to toilet to dress. She stood to complete 1 grooming task before requiring seated rest break.  Pt left seated in w/c at end of session donning socks. Left set-up with meal tray and all needs in reach, QRB donned.   Therapy Documentation Precautions:  Precautions Precautions: Fall Restrictions Weight Bearing Restrictions: No Pain:   No/ denies pain   See Function Navigator for Current Functional Status.   Therapy/Group: Individual Therapy  Jonte Shiller L 06/25/2017, 6:41 AM

## 2017-06-25 NOTE — Progress Notes (Signed)
Speech Language Pathology Daily Session Note  Patient Details  Name: Shelley [name] MRN: [id number] Date of Birth: 1946-05-21  Today's Date: 06/25/2017 SLP Individual Time: 0930-1000 SLP Individual Time Calculation (min): 30 min  Short Term Goals: Week 2: SLP Short Term Goal 1 (Week 2): Patient will demonstrate selective attention in a mildy distracting enviornment for ~30 minutes with Min A verbal cues for redirection.  SLP Short Term Goal 2 (Week 2): Patient will utilize external aids to recall orientation information with Mod A verbal cues.  SLP Short Term Goal 3 (Week 2): Pt will demonstrate functional problem with functional and familiar tasks with Min A verbal cues.  SLP Short Term Goal 4 (Week 2): Pt will maintain current topic of conversation for ~3 turn with Min A verbal cues.  SLP Short Term Goal 5 (Week 2): Patient will self-monitor and correct errors during functional tasks with Mod A verbal cues.   Skilled Therapeutic Interventions: Skilled treatment session focused on cognitive goals. SLP facilitated session by providing Min-Mod A verbal and visual cues for use of external aids to recall functional information and to recall events from previous therapy sessions. Patient left upright in wheelchair with all needs within reach. Continue with current plan of care.      Function:   Cognition Comprehension Comprehension assist level: Understands basic 90% of the time/cues < 10% of the time  Expression   Expression assist level: Expresses basic needs/ideas: With no assist  Social Interaction Social Interaction assist level: Interacts appropriately 75 - 89% of the time - Needs redirection for appropriate language or to initiate interaction.  Problem Solving Problem solving assist level: Solves basic 50 - 74% of the time/requires cueing 25 - 49% of the time  Memory Memory assist level: Recognizes or recalls 50 - 74% of the time/requires cueing 25 - 49% of the time     Pain No/Denies Pain   Therapy/Group: Individual Therapy  Shirleyann Montero, [place] 06/25/2017, 11:15 AM

## 2017-06-26 NOTE — Progress Notes (Addendum)
Subjective/Complaints:  No issues overnite occ cough,   ROS: Denies nausea, vomiting, diarrhea, shortness of breath or chest pain   Objective: Vital Signs: Blood pressure 128/86, pulse 62, temperature 97.8 F (36.6 C), temperature source Oral, resp. rate 16, height '5\' 5"'  (1.651 m), weight 71 kg (156 lb 8.4 oz), SpO2 96 %. No results found. Results for orders placed or performed during the hospital encounter of 06/13/17 (from the past 72 hour(s))  Basic metabolic panel     Status: Abnormal   Collection Time: 06/25/17 12:43 PM  Result Value Ref Range   Sodium 136 135 - 145 mmol/L   Potassium 3.6 3.5 - 5.1 mmol/L   Chloride 105 101 - 111 mmol/L   CO2 23 22 - 32 mmol/L   Glucose, Bld 121 (H) 65 - 99 mg/dL   BUN 9 6 - 20 mg/dL   Creatinine, Ser 0.75 0.44 - 1.00 mg/dL   Calcium 8.9 8.9 - 10.3 mg/dL   GFR calc non Af Amer >60 >60 mL/min   GFR calc Af Amer >60 >60 mL/min    Comment: (NOTE) The eGFR has been calculated using the CKD EPI equation. This calculation has not been validated in all clinical situations. eGFR's persistently <60 mL/min signify possible Chronic Kidney Disease.    Anion gap 8 5 - 15  CBC with Differential/Platelet     Status: Abnormal   Collection Time: 06/25/17 12:43 PM  Result Value Ref Range   WBC 6.7 4.0 - 10.5 K/uL   RBC 4.91 3.87 - 5.11 MIL/uL   Hemoglobin 15.1 (H) 12.0 - 15.0 g/dL   HCT 46.2 (H) 36.0 - 46.0 %   MCV 94.1 78.0 - 100.0 fL   MCH 30.8 26.0 - 34.0 pg   MCHC 32.7 30.0 - 36.0 g/dL   RDW 13.9 11.5 - 15.5 %   Platelets 228 150 - 400 K/uL   Neutrophils Relative % 67 %   Neutro Abs 4.4 1.7 - 7.7 K/uL   Lymphocytes Relative 28 %   Lymphs Abs 1.9 0.7 - 4.0 K/uL   Monocytes Relative 5 %   Monocytes Absolute 0.3 0.1 - 1.0 K/uL   Eosinophils Relative 0 %   Eosinophils Absolute 0.0 0.0 - 0.7 K/uL   Basophils Relative 0 %   Basophils Absolute 0.0 0.0 - 0.1 K/uL     General: No acute distress. Vital signs reviewed. Psych: Mood and affect  are normal Heart: RRR. No JVD    Lungs: CTA Bilaterally. Normal effort  Abdomen: BS+. Nondistended  Musculoskeletal: No edema. No tenderness. Skin: No evidence of breakdown, no evidence of rash Neurologic:  Motor: 4+/5 in bilateral deltoid, bicep, tricep, grip, hip flexor, knee extensors, ankle dorsiflexor and plantar flexor Fair insight and awareness  Assessment/Plan: 1. 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 Physiatrist is providing close team supervision and 24 hour management of active medical problems listed below. Physiatrist and rehab team continue to assess barriers to discharge/monitor patient progress toward functional and medical goals. FIM: Function - Bathing Position: Shower Body parts bathed by patient: Right arm, Left arm, Abdomen, Chest, Front perineal area, Buttocks, Right upper leg, Left upper leg, Right lower leg, Left lower leg, Back Body parts bathed by helper: Back Assist Level: Supervision or verbal cues  Function- Upper Body Dressing/Undressing What is the patient  wearing?: Button up shirt Pull over shirt/dress - Perfomed by patient: Thread/unthread right sleeve, Put head through opening, Thread/unthread left sleeve, Pull shirt over trunk Button up shirt - Perfomed by patient: Thread/unthread right sleeve, Thread/unthread left sleeve, Pull shirt around back Button up shirt - Perfomed by helper: Button/unbutton shirt Assist Level: Supervision or verbal cues Set up : To obtain clothing/put away Function - Lower Body Dressing/Undressing What is the patient wearing?: Pants, Non-skid slipper socks Position: Other (comment)(Sitting on toilet) Pants- Performed by patient: Thread/unthread right pants leg, Thread/unthread left pants leg, Pull pants up/down Pants- Performed by  helper: Thread/unthread right pants leg, Pull pants up/down Non-skid slipper socks- Performed by patient: Don/doff right sock, Don/doff left sock Non-skid slipper socks- Performed by helper: Don/doff right sock, Don/doff left sock Shoes - Performed by patient: Don/doff right shoe, Don/doff left shoe(slide on) Assist for footwear: Independent Assist for lower body dressing: Supervision or verbal cues  Function - Toileting Toileting activity did not occur: No continent bowel/bladder event Toileting steps completed by patient: Adjust clothing prior to toileting, Performs perineal hygiene, Adjust clothing after toileting Toileting steps completed by helper: Adjust clothing prior to toileting, Performs perineal hygiene, Adjust clothing after toileting Toileting Assistive Devices: Grab bar or rail Assist level: Touching or steadying assistance (Pt.75%)  Function - Air cabin crew transfer assistive device: Elevated toilet seat/BSC over toilet, Grab bar Assist level to toilet: Touching or steadying assistance (Pt > 75%) Assist level from toilet: Touching or steadying assistance (Pt > 75%)  Function - Chair/bed transfer Chair/bed transfer method: Stand pivot Chair/bed transfer assist level: Touching or steadying assistance (Pt > 75%) Chair/bed transfer assistive device: Walker Chair/bed transfer details: Verbal cues for precautions/safety, Verbal cues for safe use of DME/AE  Function - Locomotion: Wheelchair Will patient use wheelchair at discharge?: Yes(tbd) Type: Manual Max wheelchair distance: 35 ft Assist Level: Touching or steadying assistance (Pt > 75%) Wheel 50 feet with 2 turns activity did not occur: Safety/medical concerns Wheel 150 feet activity did not occur: Safety/medical concerns Turns around,maneuvers to table,bed, and toilet,negotiates 3% grade,maneuvers on rugs and over doorsills: No Function - Locomotion: Ambulation Assistive device: Walker-rolling Max distance:  50 Assist level: Touching or steadying assistance (Pt > 75%) Assist level: Touching or steadying assistance (Pt > 75%) Walk 50 feet with 2 turns activity did not occur: Safety/medical concerns Assist level: Touching or steadying assistance (Pt > 75%) Walk 150 feet activity did not occur: Safety/medical concerns Walk 10 feet on uneven surfaces activity did not occur: Safety/medical concerns  Function - Comprehension Comprehension: Auditory Comprehension assist level: Understands basic 90% of the time/cues < 10% of the time  Function - Expression Expression: Verbal Expression assist level: Expresses basic needs/ideas: With no assist  Function - Social Interaction Social Interaction assist level: Interacts appropriately 75 - 89% of the time - Needs redirection for appropriate language or to initiate interaction.  Function - Problem Solving Problem solving assist level: Solves basic 50 - 74% of the time/requires cueing 25 - 49% of the time  Function - Memory Memory assist level: Recognizes or recalls 50 - 74% of the time/requires cueing 25 - 49% of the time Patient normally able to recall (first 3 days only): Current season, That he or she is in a hospital  Medical Problem List and Plan: 1.  Diplopia with decreased functional mobility  secondary to acute lacunar infarct in the left dorsal pons and right cerebellum as well as right thalamus    Cont CIR PT, OT, SLP,  team conf in am 2.  DVT Prophylaxis/Anticoagulation: Subcutaneous Lovenox. PLT nl 12/24 3. Pain Management: Tylenol as needed  4. Mood: Provide emotional support  5. Neuropsych: This patient is  capable of making decisions on her  own behalf.     -memory deficits at times 6. Skin/Wound Care: Routine skin checks  7. Fluids/Electrolytes/Nutrition: Routine I&O's , Intake of fluids low   BMP within acceptable range on 12/13, recheck BMET 12/24 normal     8. Seizure disorder. Keppra 500 mg twice a day with no seizures since  admission to rehabilitation 9. Hypertension. Lopressor 25 mg twice a day, lisinopril 10 mg daily.  Vitals:   06/25/17 2033 06/26/17 0500  BP: 138/70 128/86  Pulse: 66 62  Resp:  16  Temp:  97.8 F (36.6 C)  SpO2: 98% 96%    Relatively controlled on 12/25  LOS (Days) 13 A FACE TO FACE EVALUATION WAS PERFORMED  Charlett Blake 06/26/2017, 8:38 AM

## 2017-06-27 ENCOUNTER — Inpatient Hospital Stay (HOSPITAL_COMMUNITY): Payer: Medicare Other | Admitting: Physical Therapy

## 2017-06-27 ENCOUNTER — Inpatient Hospital Stay (HOSPITAL_COMMUNITY): Payer: Medicare Other | Admitting: Occupational Therapy

## 2017-06-27 ENCOUNTER — Inpatient Hospital Stay (HOSPITAL_COMMUNITY): Payer: Medicare Other

## 2017-06-27 ENCOUNTER — Other Ambulatory Visit: Payer: Self-pay

## 2017-06-27 LAB — CREATININE, SERUM
Creatinine, Ser: 0.69 mg/dL (ref 0.44–1.00)
GFR calc Af Amer: >60 mL/min (ref >60)
GFR calc non Af Amer: >60 mL/min (ref >60)

## 2017-06-27 NOTE — Progress Notes (Signed)
Physical Therapy Session Note  Patient Details  Name: Shelley [name] MRN: [id number] Date of Birth: 11-Jul-1945  Today's Date: 06/27/2017 PT Individual Time: 1415-1515 PT Individual Time Calculation (min): 60 min   Short Term Goals: Week 2:  PT Short Term Goal 1 (Week 2): STG = LTG due to ELOS  Skilled Therapeutic Interventions/Progress Updates:   Pt received sitting in WC and agreeable to PT Pt transported to rehab gym. PT instructed pt in Stair negotiation x 12( 3"x8 and 6"x4) with supervision assist. Min cues for step to gait pattern and posture, significant increased time allowed for task. Nustep endurance and reciprocal movement training x 8 minutes with min cues for increased speed to improved cardiovascular training. Gait training x 45f in hospital gift shop [place]. PT provided min cues for increased gait speed and posture. Throughout treatment, pt performed sit<>stand with sueprvision assist and intermittent cues for proper UE placement. [name] in room at end of treatment. Family education initiated for husband to provide proper safety cues with transfers and gait. Pt returned to room and performed ambulatory transfer to bed with supervision assist. Sit>supine completed with supervision assist, and  Pt left supine in bed with call bell in reach and all needs met.       Therapy Documentation Precautions:  Precautions Precautions: Fall Restrictions Weight Bearing Restrictions: No Vital Signs: Therapy Vitals Temp: 98.6 F (37 C) Temp Source: Oral Pulse Rate: 65 Resp: 17 BP: 90/64 Patient Position (if appropriate): Sitting Oxygen Therapy SpO2: 96 % O2 Device: Not Delivered Pain: 0/10   See Function Navigator for Current Functional Status.   Therapy/Group: Individual Therapy  [name]12/26/2018, 4:36 PM

## 2017-06-27 NOTE — Progress Notes (Signed)
Speech Language Pathology Daily Session Note  Patient Details  Name: ADAIRA CENTOLA MRN: [id number] Date of Birth: [birth year]  Today's Date: 06/27/2017 SLP Individual Time: 1130-1200 SLP Individual Time Calculation (min): 30 min  Short Term Goals: Week 2: SLP Short Term Goal 1 (Week 2): Patient will demonstrate selective attention in a mildy distracting enviornment for ~30 minutes with Min A verbal cues for redirection.  SLP Short Term Goal 2 (Week 2): Patient will utilize external aids to recall orientation information with Mod A verbal cues.  SLP Short Term Goal 3 (Week 2): Pt will demonstrate functional problem with functional and familiar tasks with Min A verbal cues.  SLP Short Term Goal 4 (Week 2): Pt will maintain current topic of conversation for ~3 turn with Min A verbal cues.  SLP Short Term Goal 5 (Week 2): Patient will self-monitor and correct errors during functional tasks with Mod A verbal cues.   Skilled Therapeutic Interventions: Skilled ST services focused on cognitive skills. SLP facilitated recall of today's events utilizing therapy schedule and memory notebook requiring mod A cues to utilize visual aid. SLP facilitated orientation to place and time with min A verbal cues and problem solving skills with 3-4 step sequence cards with Mod A verbal cues. Pt demonstrated ability to maintain topic over 5 exchanges with min A verbal cues for redirection and required min A verbal cues for selective attention. Pt was left in room with call bell within reach. Recommend to continue skilled ST services.     Function:  Eating Eating   Modified Consistency Diet: No Eating Assist Level: More than reasonable amount of time           Cognition Comprehension Comprehension assist level: Understands basic 90% of the time/cues < 10% of the time  Expression   Expression assist level: Expresses basic needs/ideas: With no assist  Social Interaction Social Interaction assist level:  Interacts appropriately 75 - 89% of the time - Needs redirection for appropriate language or to initiate interaction.  Problem Solving Problem solving assist level: Solves basic 50 - 74% of the time/requires cueing 25 - 49% of the time  Memory Memory assist level: Recognizes or recalls 50 - 74% of the time/requires cueing 25 - 49% of the time    Pain Pain Assessment Pain Assessment: No/denies pain Faces Pain Scale: No hurt Pain Type: Acute pain Pain Location: Head Pain Descriptors / Indicators: Headache Pain Frequency: Intermittent Pain Onset: Gradual Patients Stated Pain Goal: 0 Pain Intervention(s): Medication (See eMAR)  Therapy/Group: Individual Therapy  Darika Ildefonso  [facility] 06/27/2017, 12:18 PM

## 2017-06-27 NOTE — Patient Care Conference (Signed)
Inpatient RehabilitationTeam Conference and Plan of Care Update Date: 06/27/2017   Time: 10:50 Am    Patient Name: Shelley [name]      Medical Record Number: [id number]  Date of Birth: 1945-10-27 Sex: Female         Room/Bed: 4W17C/4W17C-01 Payor Info: Payor: [employer] MEDICARE / Plan: [facility] MEDICARE / Product Type: *No Product type* /    Admitting Diagnosis: CVA  Admit Date/Time:  06/13/2017  6:23 PM Admission Comments: No comment available   Primary Diagnosis:  <principal problem not specified> Principal Problem: <principal problem not specified>  Patient Active Problem List   Diagnosis Date Noted  . Adjustment disorder with mixed anxiety and depressed mood   . 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 Benign essential HTN   . Diastolic dysfunction   . Hypokalemia   . Stroke (cerebrum) ([place]) 06/10/2017  . Acute lacunar stroke ([place]) 06/08/2017  . Diplopia 06/08/2017  . Hypertension   . Anxiety   . Seizures ([place])   . Encounter for screening colonoscopy 05/14/2012    Expected Discharge Date: Expected Discharge Date: 06/29/17  Team Members Present: Physician leading conference: Dr. Alysia Penna Social Worker Present: Ovidio Kin, LCSW Nurse Present: Dorthula Nettles, RN PT Present: Phylliss Bob, PTA;Barrie Folk, PT OT Present: Napoleon Form, OT SLP Present: Charolett Bumpers, SLP PPS Coordinator present : Daiva Nakayama, RN, CRRN     Current Status/Progress Goal Weekly Team Focus  Medical        medically stable     Bowel/Bladder   LBM 06/25/17  continent of b/b  toileting schedule, assess for and provide incontinent care decrease incontinence   Swallow/Nutrition/ Hydration             ADL's   Supervision overall with VCs for attention to task and RW management; increased time and rest breaks required throughout ADLs  Supervision overall  ADL re-training, activity tolerance, d/c planning   Mobility   Supervision overall with bed mobility,  transfers and ambulation up to 164ft.   Supervision assist with RW for gait, transfers, and bed mobility.   safety with trasnfers, increased endurance, improved balance.      Communication             Safety/Cognition/ Behavioral Observations  Min-mod A recall with external aid  Supervision A selective attention, Recall Mod A, awareness/problem solving/orientation Min A      Pain   denies any pain LLE sensitive to touch  pain <=2  assess for pain, treat per plan of care, reevaluate for effectivess after giving analgesics   Skin   BLE dry,scaly, and flaky, Eucerin cream BID  Skin will be within normal limits with no breakdown. impoved dry skin  assess q shift and prn      *See Care Plan and progress notes for long and short-term goals.     Barriers to Discharge  Current Status/Progress Possible Resolutions Date Resolved   Physician                    Nursing                  PT                    OT                  SLP                SW  Discharge Planning/Teaching Needs:  Home with husband who can provide 24 hr supervision, here daily and will need to participate in therapies prior to discharge home      Team Discussion:  Progressing toward her goals of supervision level. Husband has been in for education and comfortable with her care. Easily distracted and needs increased time to perform task. Recall is improving and attention. Medically stable for discharge Friday.  Revisions to Treatment Plan:  DC 12/28       Godfrey Tritschler, Gardiner Rhyme 06/28/2017, 8:23 AM

## 2017-06-27 NOTE — Progress Notes (Signed)
Occupational Therapy Session Note  Patient Details  Name: Shelley [name] MRN: [id number] Date of Birth: Dec 22, 1945  Today's Date: 06/27/2017 OT Individual Time: 3382-5053 OT Individual Time Calculation (min): 60 min    Short Term Goals: Week 2:  OT Short Term Goal 1 (Week 2): STG=LTG due to LOS  Skilled Therapeutic Interventions/Progress Updates:    Pt seen for OT ADL bathing/dressing session. Pt in supine upon arrival, agreeable to tx session. She ambulated throughout session with RW and close supervision, min cuing throughout for RW management during functional ambulation and transfers. She bathed seated on tub bench with supervision and returned to w/c to dress, standing at RW to pull pants up, VCs for hand placement on RW during sit <> stands.. She requires significantly increased time to complete all tasks, min cuing this session for attention and redirection to task, improvement from previous sessions.  Pt left seated in w/c at end of session, all needs in reach and QRB donned.   Therapy Documentation Precautions:  Precautions Precautions: Fall Restrictions Weight Bearing Restrictions: No Pain:   No/ denies pain  See Function Navigator for Current Functional Status.   Therapy/Group: Individual Therapy  Amarius Toto L 06/27/2017, 7:08 AM

## 2017-06-27 NOTE — Progress Notes (Signed)
Subjective/Complaints:  No cough today, plans to smoke when she leaves hospital  ROS: Denies nausea, vomiting, diarrhea, shortness of breath or chest pain   Objective: Vital Signs: Blood pressure (!) 141/78, pulse (!) 53, temperature 98.6 F (37 C), temperature source Oral, resp. rate 16, height '5\' 5"'  (1.651 m), weight 69.1 kg (152 lb 5.4 oz), SpO2 96 %. No results found. Results for orders placed or performed during the hospital encounter of 06/13/17 (from the past 72 hour(s))  Basic metabolic panel     Status: Abnormal   Collection Time: 06/25/17 12:43 PM  Result Value Ref Range   Sodium 136 135 - 145 mmol/L   Potassium 3.6 3.5 - 5.1 mmol/L   Chloride 105 101 - 111 mmol/L   CO2 23 22 - 32 mmol/L   Glucose, Bld 121 (H) 65 - 99 mg/dL   BUN 9 6 - 20 mg/dL   Creatinine, Ser 0.75 0.44 - 1.00 mg/dL   Calcium 8.9 8.9 - 10.3 mg/dL   GFR calc non Af Amer >60 >60 mL/min   GFR calc Af Amer >60 >60 mL/min    Comment: (NOTE) The eGFR has been calculated using the CKD EPI equation. This calculation has not been validated in all clinical situations. eGFR's persistently <60 mL/min signify possible Chronic Kidney Disease.    Anion gap 8 5 - 15  CBC with Differential/Platelet     Status: Abnormal   Collection Time: 06/25/17 12:43 PM  Result Value Ref Range   WBC 6.7 4.0 - 10.5 K/uL   RBC 4.91 3.87 - 5.11 MIL/uL   Hemoglobin 15.1 (H) 12.0 - 15.0 g/dL   HCT 46.2 (H) 36.0 - 46.0 %   MCV 94.1 78.0 - 100.0 fL   MCH 30.8 26.0 - 34.0 pg   MCHC 32.7 30.0 - 36.0 g/dL   RDW 13.9 11.5 - 15.5 %   Platelets 228 150 - 400 K/uL   Neutrophils Relative % 67 %   Neutro Abs 4.4 1.7 - 7.7 K/uL   Lymphocytes Relative 28 %   Lymphs Abs 1.9 0.7 - 4.0 K/uL   Monocytes Relative 5 %   Monocytes Absolute 0.3 0.1 - 1.0 K/uL   Eosinophils Relative 0 %   Eosinophils Absolute 0.0 0.0 - 0.7 K/uL   Basophils Relative 0 %   Basophils Absolute 0.0 0.0 - 0.1 K/uL  Creatinine, serum     Status: None   Collection  Time: 06/27/17  5:40 AM  Result Value Ref Range   Creatinine, Ser 0.69 0.44 - 1.00 mg/dL   GFR calc non Af Amer >60 >60 mL/min   GFR calc Af Amer >60 >60 mL/min    Comment: (NOTE) The eGFR has been calculated using the CKD EPI equation. This calculation has not been validated in all clinical situations. eGFR's persistently <60 mL/min signify possible Chronic Kidney Disease.      General: No acute distress. Vital signs reviewed. Psych: Mood and affect are normal Heart: RRR. No JVD    Lungs: CTA Bilaterally. Normal effort  Abdomen: BS+. Nondistended  Musculoskeletal: No edema. No tenderness. Skin: No evidence of breakdown, no evidence of rash Neurologic:  Motor: 4+/5 in bilateral deltoid, bicep, tricep, grip, hip flexor, knee extensors, ankle dorsiflexor and plantar flexor Fair insight and awareness  Assessment/Plan: 1. Functional deficits secondary to acute lacunar infarct in the left dorsal pons and right cerebellum as well as right thalamus    which require 3+ hours per day of interdisciplinary therapy in a comprehensive  inpatient rehab setting. Physiatrist is providing close team supervision and 24 hour management of active medical problems listed below. Physiatrist and rehab team continue to assess barriers to discharge/monitor patient progress toward functional and medical goals. FIM: Function - Bathing Position: Shower Body parts bathed by patient: Right arm, Left arm, Abdomen, Chest, Front perineal area, Buttocks, Right upper leg, Left upper leg, Right lower leg, Left lower leg Body parts bathed by helper: Back Assist Level: Supervision or verbal cues  Function- Upper Body Dressing/Undressing What is the patient wearing?: Button up shirt Pull over shirt/dress - Perfomed by patient: Thread/unthread right sleeve, Put head through opening, Thread/unthread left sleeve, Pull shirt over trunk Button up shirt - Perfomed by patient: Thread/unthread right sleeve, Thread/unthread  left sleeve, Pull shirt around back, Button/unbutton shirt Button up shirt - Perfomed by helper: Button/unbutton shirt Assist Level: Supervision or verbal cues Set up : To obtain clothing/put away Function - Lower Body Dressing/Undressing What is the patient wearing?: Pants, Shoes Position: Wheelchair/chair at sink Pants- Performed by patient: Thread/unthread right pants leg, Thread/unthread left pants leg, Pull pants up/down Pants- Performed by helper: Thread/unthread right pants leg, Pull pants up/down Non-skid slipper socks- Performed by patient: Don/doff right sock, Don/doff left sock Non-skid slipper socks- Performed by helper: Don/doff right sock, Don/doff left sock Shoes - Performed by patient: Don/doff right shoe, Don/doff left shoe(Slide on) Assist for footwear: Independent Assist for lower body dressing: Supervision or verbal cues  Function - Toileting Toileting activity did not occur: No continent bowel/bladder event Toileting steps completed by patient: Adjust clothing prior to toileting, Performs perineal hygiene, Adjust clothing after toileting Toileting steps completed by helper: Adjust clothing prior to toileting, Performs perineal hygiene, Adjust clothing after toileting Toileting Assistive Devices: Grab bar or rail Assist level: Supervision or verbal cues  Function - Air cabin crew transfer assistive device: Elevated toilet seat/BSC over toilet, Grab bar Assist level to toilet: Supervision or verbal cues Assist level from toilet: Supervision or verbal cues  Function - Chair/bed transfer Chair/bed transfer method: Stand pivot Chair/bed transfer assist level: Touching or steadying assistance (Pt > 75%) Chair/bed transfer assistive device: Walker Chair/bed transfer details: Verbal cues for precautions/safety, Verbal cues for safe use of DME/AE  Function - Locomotion: Wheelchair Will patient use wheelchair at discharge?: Yes(tbd) Type: Manual Max wheelchair  distance: 35 ft Assist Level: Touching or steadying assistance (Pt > 75%) Wheel 50 feet with 2 turns activity did not occur: Safety/medical concerns Wheel 150 feet activity did not occur: Safety/medical concerns Turns around,maneuvers to table,bed, and toilet,negotiates 3% grade,maneuvers on rugs and over doorsills: No Function - Locomotion: Ambulation Assistive device: Walker-rolling Max distance: 50 Assist level: Touching or steadying assistance (Pt > 75%) Assist level: Touching or steadying assistance (Pt > 75%) Walk 50 feet with 2 turns activity did not occur: Safety/medical concerns Assist level: Touching or steadying assistance (Pt > 75%) Walk 150 feet activity did not occur: Safety/medical concerns Walk 10 feet on uneven surfaces activity did not occur: Safety/medical concerns  Function - Comprehension Comprehension: Auditory Comprehension assist level: Understands basic 90% of the time/cues < 10% of the time  Function - Expression Expression: Verbal Expression assist level: Expresses basic needs/ideas: With no assist  Function - Social Interaction Social Interaction assist level: Interacts appropriately 75 - 89% of the time - Needs redirection for appropriate language or to initiate interaction.  Function - Problem Solving Problem solving assist level: Solves basic 50 - 74% of the time/requires cueing 25 - 49% of the time  Function - Memory Memory assist level: Recognizes or recalls 50 - 74% of the time/requires cueing 25 - 49% of the time Patient normally able to recall (first 3 days only): Current season, That he or she is in a hospital  Medical Problem List and Plan: 1.  Diplopia with decreased functional mobility  secondary to acute lacunar infarct in the left dorsal pons and right cerebellum as well as right thalamus    Cont CIR PT, OT, SLP, Team conference today please see physician documentation under team conference tab, met with team face-to-face to discuss  problems,progress, and goals. Formulized individual treatment plan based on medical history, underlying problem and comorbidities. 2.  DVT Prophylaxis/Anticoagulation: Subcutaneous Lovenox. PLT nl 12/24 3. Pain Management: Tylenol as needed  4. Mood: Provide emotional support  5. Neuropsych: This patient is  capable of making decisions on her  own behalf.     -memory deficits at times 6. Skin/Wound Care: Routine skin checks  7. Fluids/Electrolytes/Nutrition: Routine I&O's , Intake of fluids low   BMP within acceptable range on 12/13, recheck BMET 12/24 normal     8. Seizure disorder. Keppra 500 mg twice a day with no seizures since admission to rehabilitation 9. Hypertension. Lopressor 25 mg twice a day, lisinopril 10 mg daily.  Vitals:   06/26/17 2024 06/27/17 0540  BP: (!) 141/95 (!) 141/78  Pulse: 61 (!) 53  Resp:  16  Temp:  98.6 F (37 C)  SpO2:  96%    Relatively controlled on 12/26  LOS (Days) 14 A FACE TO FACE EVALUATION WAS PERFORMED  Charlett Blake 06/27/2017, 10:45 AM

## 2017-06-27 NOTE — Progress Notes (Signed)
Physical Therapy Session Note  Patient Details  Name: Shelley [name] MRN: [id number] Date of Birth: [birth year]  Today's Date: 06/27/2017 PT Individual Time: 1300-1345 PT Individual Time Calculation (min): 45 min   Short Term Goals: Week 2:  PT Short Term Goal 1 (Week 2): STG = LTG due to ELOS  Skilled Therapeutic Interventions/Progress Updates:    Pt seated in w/c in room upon therapist arrival, pt is agreeable to participate in therapy session and reports no pain. Sit to stand with Min Assist to RW. Ambulation 2 x 50 ft with RW and SBA, encouragement for upright posture and to increase gait speed. Pt exhibits improved ability to perform turns with RW to sit safely in w/c with decreased verbal cueing needed this date. Ascend/descend 4 stairs with 2 handrails and Mod Assist, v/c for step-to gait pattern for safety. Seated BLE therex x 10 reps. Pt left seated in w/c in room with quick-release belt in place, needs in reach.  Therapy Documentation Precautions:  Precautions Precautions: Fall Restrictions Weight Bearing Restrictions: No  See Function Navigator for Current Functional Status.   Therapy/Group: Individual Therapy  Excell Seltzer, PT, DPT  06/27/2017, 4:24 PM

## 2017-06-28 ENCOUNTER — Inpatient Hospital Stay (HOSPITAL_COMMUNITY): Payer: Medicare Other | Admitting: Speech Pathology

## 2017-06-28 ENCOUNTER — Inpatient Hospital Stay (HOSPITAL_COMMUNITY): Payer: Medicare Other | Admitting: Physical Therapy

## 2017-06-28 ENCOUNTER — Inpatient Hospital Stay (HOSPITAL_COMMUNITY): Payer: Medicare Other | Admitting: Occupational Therapy

## 2017-06-28 LAB — COMPREHENSIVE METABOLIC PANEL
ALT: 20 U/L (ref 14–54)
ANION GAP: 10 (ref 5–15)
AST: 20 U/L (ref 15–41)
Albumin: 3.3 g/dL — ABNORMAL LOW (ref 3.5–5.0)
Alkaline Phosphatase: 104 U/L (ref 38–126)
BILIRUBIN TOTAL: 0.5 mg/dL (ref 0.3–1.2)
BUN: 10 mg/dL (ref 6–20)
CHLORIDE: 103 mmol/L (ref 101–111)
CO2: 25 mmol/L (ref 22–32)
Calcium: 9.3 mg/dL (ref 8.9–10.3)
Creatinine, Ser: 0.69 mg/dL (ref 0.44–1.00)
GFR calc Af Amer: >60 mL/min (ref >60)
Glucose, Bld: 113 mg/dL — ABNORMAL HIGH (ref 65–99)
POTASSIUM: 4 mmol/L (ref 3.5–5.1)
Sodium: 138 mmol/L (ref 135–145)
TOTAL PROTEIN: 6.3 g/dL — AB (ref 6.5–8.1)

## 2017-06-28 NOTE — Progress Notes (Signed)
Subjective/Complaints:  No issues overnite, discussed smoking cessation  ROS: Denies nausea, vomiting, diarrhea, shortness of breath or chest pain   Objective: Vital Signs: Blood pressure (!) 141/73, pulse 60, temperature 97.8 F (36.6 C), temperature source Oral, resp. rate 17, height _0  (1.651 m), weight 69.1 kg (152 lb 5.4 oz), SpO2 97 %. No results found. Results for orders placed or performed during the hospital encounter of 06/13/17 (from the past 72 hour(s))  Basic metabolic panel     Status: Abnormal   Collection Time: 06/25/17 12:43 PM  Result Value Ref Range   Sodium 136 135 - 145 mmol/L   Potassium 3.6 3.5 - 5.1 mmol/L   Chloride 105 101 - 111 mmol/L   CO2 23 22 - 32 mmol/L   Glucose, Bld 121 (H) 65 - 99 mg/dL   BUN 9 6 - 20 mg/dL   Creatinine, Ser 0.75 0.44 - 1.00 mg/dL   Calcium 8.9 8.9 - 10.3 mg/dL   GFR calc non Af Amer >60 >60 mL/min   GFR calc Af Amer >60 >60 mL/min    Comment: (NOTE) The eGFR has been calculated using the CKD EPI equation. This calculation has not been validated in all clinical situations. eGFR's persistently <60 mL/min signify possible Chronic Kidney Disease.    Anion gap 8 5 - 15  CBC with Differential/Platelet     Status: Abnormal   Collection Time: 06/25/17 12:43 PM  Result Value Ref Range   WBC 6.7 4.0 - 10.5 K/uL   RBC 4.91 3.87 - 5.11 MIL/uL   Hemoglobin 15.1 (H) 12.0 - 15.0 g/dL   HCT 46.2 (H) 36.0 - 46.0 %   MCV 94.1 78.0 - 100.0 fL   MCH 30.8 26.0 - 34.0 pg   MCHC 32.7 30.0 - 36.0 g/dL   RDW 13.9 11.5 - 15.5 %   Platelets 228 150 - 400 K/uL   Neutrophils Relative % 67 %   Neutro Abs 4.4 1.7 - 7.7 K/uL   Lymphocytes Relative 28 %   Lymphs Abs 1.9 0.7 - 4.0 K/uL   Monocytes Relative 5 %   Monocytes Absolute 0.3 0.1 - 1.0 K/uL   Eosinophils Relative 0 %   Eosinophils Absolute 0.0 0.0 - 0.7 K/uL   Basophils Relative 0 %   Basophils Absolute 0.0 0.0 - 0.1 K/uL  Creatinine, serum     Status: None   Collection Time:  06/27/17  5:40 AM  Result Value Ref Range   Creatinine, Ser 0.69 0.44 - 1.00 mg/dL   GFR calc non Af Amer >60 >60 mL/min   GFR calc Af Amer >60 >60 mL/min    Comment: (NOTE) The eGFR has been calculated using the CKD EPI equation. This calculation has not been validated in all clinical situations. eGFR's persistently <60 mL/min signify possible Chronic Kidney Disease.      General: No acute distress. Vital signs reviewed. Psych: Mood and affect are normal Heart: RRR. No JVD    Lungs: CTA Bilaterally. Normal effort  Abdomen: BS+. Nondistended  Musculoskeletal: No edema. No tenderness. Skin: No evidence of breakdown, no evidence of rash Neurologic:  Motor: 4+/5 in bilateral deltoid, bicep, tricep, grip, hip flexor, knee extensors, ankle dorsiflexor and plantar flexor Fair insight and awareness  Assessment/Plan: 1. 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 Physiatrist is  providing close team supervision and 24 hour management of active medical problems listed below. Physiatrist and rehab team continue to assess barriers to discharge/monitor patient progress toward functional and medical goals. FIM: Function - Bathing Position: Shower Body parts bathed by patient: Right arm, Left arm, Abdomen, Chest, Front perineal area, Buttocks, Right upper leg, Left upper leg, Right lower leg, Left lower leg Body parts bathed by helper: Back Assist Level: Supervision or verbal cues  Function- Upper Body Dressing/Undressing What is the patient wearing?: Button up shirt Pull over shirt/dress - Perfomed by patient: Thread/unthread right sleeve, Put head through opening, Thread/unthread left sleeve, Pull shirt over trunk Button up shirt - Perfomed by patient: Thread/unthread right sleeve, Thread/unthread left  sleeve, Pull shirt around back, Button/unbutton shirt Button up shirt - Perfomed by helper: Button/unbutton shirt Assist Level: Supervision or verbal cues Set up : To obtain clothing/put away Function - Lower Body Dressing/Undressing What is the patient wearing?: Pants, Shoes Position: Wheelchair/chair at sink Pants- Performed by patient: Thread/unthread right pants leg, Thread/unthread left pants leg, Pull pants up/down Pants- Performed by helper: Thread/unthread right pants leg, Pull pants up/down Non-skid slipper socks- Performed by patient: Don/doff right sock, Don/doff left sock Non-skid slipper socks- Performed by helper: Don/doff right sock, Don/doff left sock Shoes - Performed by patient: Don/doff right shoe, Don/doff left shoe(Slide on) Assist for footwear: Independent Assist for lower body dressing: Supervision or verbal cues  Function - Toileting Toileting activity did not occur: No continent bowel/bladder event Toileting steps completed by patient: Adjust clothing prior to toileting, Performs perineal hygiene, Adjust clothing after toileting Toileting steps completed by helper: Adjust clothing prior to toileting, Performs perineal hygiene, Adjust clothing after toileting Toileting Assistive Devices: Grab bar or rail Assist level: Supervision or verbal cues  Function - Air cabin crew transfer assistive device: Elevated toilet seat/BSC over toilet, Grab bar Assist level to toilet: Supervision or verbal cues Assist level from toilet: Supervision or verbal cues  Function - Chair/bed transfer Chair/bed transfer method: Stand pivot Chair/bed transfer assist level: Supervision or verbal cues Chair/bed transfer assistive device: Environmental consultant, Armrests Chair/bed transfer details: Verbal cues for precautions/safety, Verbal cues for safe use of DME/AE  Function - Locomotion: Wheelchair Will patient use wheelchair at discharge?: Yes(tbd) Type: Manual Max wheelchair distance: 35  ft Assist Level: Touching or steadying assistance (Pt > 75%) Wheel 50 feet with 2 turns activity did not occur: Safety/medical concerns Wheel 150 feet activity did not occur: Safety/medical concerns Turns around,maneuvers to table,bed, and toilet,negotiates 3% grade,maneuvers on rugs and over doorsills: No Function - Locomotion: Ambulation Assistive device: Walker-rolling Max distance: 50 Assist level: Supervision or verbal cues Assist level: Supervision or verbal cues Walk 50 feet with 2 turns activity did not occur: Safety/medical concerns Assist level: Supervision or verbal cues Walk 150 feet activity did not occur: Safety/medical concerns Walk 10 feet on uneven surfaces activity did not occur: Safety/medical concerns  Function - Comprehension Comprehension: Auditory Comprehension assist level: Understands basic 90% of the time/cues < 10% of the time  Function - Expression Expression: Verbal Expression assist level: Expresses basic needs/ideas: With no assist  Function - Social Interaction Social Interaction assist level: Interacts appropriately 75 - 89% of the time - Needs redirection for appropriate language or to initiate interaction.  Function - Problem Solving Problem solving assist level: Solves basic 50 - 74% of the time/requires cueing 25 - 49% of the time  Function - Memory Memory assist level: Recognizes or recalls 50 - 74% of the time/requires  cueing 25 - 49% of the time Patient normally able to recall (first 3 days only): Current season, That he or she is in a hospital  Medical Problem List and Plan: 1.  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  DVT Prophylaxis/Anticoagulation: Subcutaneous Lovenox. PLT nl 12/24 3. Pain Management: Tylenol as needed  4. Mood: Provide emotional support  5. Neuropsych: This patient is  capable of  making decisions on her  own behalf.     -memory deficits at times 6. Skin/Wound Care: Routine skin checks  7. Fluids/Electrolytes/Nutrition: Routine I&O's , Intake of fluids low   BMP within acceptable range on 12/13, recheck BMET 12/24 normal     8. Seizure disorder. Keppra 500 mg twice a day with no seizures since admission to rehabilitation 9. Hypertension. Lopressor 25 mg twice a day, lisinopril 10 mg daily.  Vitals:   06/27/17 2122 06/28/17 0255  BP: 127/79 (!) 141/73  Pulse: 64 60  Resp:  17  Temp:  97.8 F (36.6 C)  SpO2:  97%    Relatively controlled on 12/27  LOS (Days) 15 A FACE TO FACE EVALUATION WAS PERFORMED  Charlett Blake 06/28/2017, 7:35 AM

## 2017-06-28 NOTE — Progress Notes (Signed)
Occupational Therapy Discharge Summary  Patient Details  Name: Shelley [name] MRN: [id number] Date of Birth: 06-Mar-1946   Patient has met 33 of 11 long term goals due to improved activity tolerance, improved balance, postural control, ability to compensate for deficits, improved attention, improved awareness and improved coordination.  Patient to discharge at overall Supervision level.  Patient's care partner is independent to provide the necessary physical and cognitive assistance at discharge.  [name] has been present for several OT sessions during rehab admission. Caregiver education completed regarding use of tub transfer bench for bathing as well as cuing required for RW management. She requires significantly increased time and encouragement for all mobility and ADL tasks.    Recommendation:  Patient will benefit from ongoing skilled OT services in home health setting to continue to advance functional skills in the area of BADL, iADL and Reduce care partner burden.  Equipment: tub transfer bench  Reasons for discharge: treatment goals met and discharge from hospital  Patient/family agrees with progress made and goals achieved: Yes  OT Discharge Precautions/Restrictions  Precautions Precautions: Fall Restrictions Weight Bearing Restrictions: No General Chart Reviewed: Yes Vision Baseline Vision/History: Wears glasses Wears Glasses: At all times Patient Visual Report: No change from baseline Perception  Perception: Within Functional Limits Praxis Praxis: Intact Cognition Overall Cognitive Status: Impaired/Different from baseline Arousal/Alertness: Awake/alert Orientation Level: Oriented to person;Other (comment)(Change in cognition on final day of therapy) Attention: Sustained Sustained Attention: Impaired Sustained Attention Impairment: Verbal complex;Functional basic Memory: Impaired Memory Impairment: Decreased short term memory Decreased Short Term Memory:  Functional basic;Verbal basic Awareness: Impaired Awareness Impairment: Intellectual impairment Problem Solving: Impaired Problem Solving Impairment: Functional basic;Verbal complex;Functional complex Decision Making: Impaired Initiating: Appears intact Safety/Judgment: Impaired Sensation Sensation Light Touch: Appears Intact Proprioception: Appears Intact Coordination Gross Motor Movements are Fluid and Coordinated: No Fine Motor Movements are Fluid and Coordinated: Yes Coordination and Movement Description: Generalized weakness and deconditioning Motor  Motor Motor: Abnormal postural alignment and control;Other (comment) Motor - Discharge Observations: Generalized weakness and decreased initiation.  Trunk/Postural Assessment  Cervical Assessment Cervical Assessment: Exceptions to WFL(Forward head) Thoracic Assessment Thoracic Assessment: Exceptions to WFL(Rounded shoulders; kyphotic) Lumbar Assessment Lumbar Assessment: Exceptions to WFL(Posterior pelvic tilt) Postural Control Postural Control: Deficits on evaluation Righting Reactions: mild delay in standing  Balance Balance Balance Assessed: Yes Static Sitting Balance Static Sitting - Level of Assistance: 6: Modified independent (Device/Increase time) Dynamic Sitting Balance Dynamic Sitting - Balance Support: During functional activity;Feet supported Dynamic Sitting - Level of Assistance: 6: Modified independent (Device/Increase time) Sitting balance - Comments: Sitting to complete bathing/dressing tasks Static Standing Balance Static Standing - Balance Support: Bilateral upper extremity supported Static Standing - Level of Assistance: 5: Stand by assistance Static Standing - Comment/# of Minutes: During bathing/dressing tasks Dynamic Standing Balance Dynamic Standing - Balance Support: During functional activity;Right upper extremity supported;Left upper extremity supported Dynamic Standing - Level of Assistance: 5:  Stand by assistance Extremity/Trunk Assessment RUE Assessment RUE Assessment: Within Functional Limits LUE Assessment LUE Assessment: Within Functional Limits   See Function Navigator for Current Functional Status.  Shelley [name] L 06/28/2017, 3:33 PM

## 2017-06-28 NOTE — Progress Notes (Signed)
Occupational Therapy Session Note  Patient Details  Name: Shelley [name] MRN: [id number] Date of Birth: September 16, 1945  Today's Date: 06/28/2017 OT Individual Time: 1300-1404 OT Individual Time Calculation (min): 64 min    Short Term Goals: Week 2:  OT Short Term Goal 1 (Week 2): STG=LTG due to LOS  Skilled Therapeutic Interventions/Progress Updates:    Therapist arrived for scheduled session at 10:45, pt in supine voicing increased fatigue and general "not feeling well". She refused all OOB tx and desired to stay in bed to rest.  Therapist returned at 1300 to make up session, pt sitting EOB reading newspaper. Pt with much more confusion this session compared to past sessions. She was oriented to person and month only.  With lots of encouragement and extra time, she completed functional ambulation and transfers with supervision using RW. She gathered clothing items from drawers with supervision and significantly increased time and cuing in order to obtain all needed items. Throughout session, required VCs and some manual facilitation for RW management in functional context.  She bathed seated on tub bench with supervision. Returned to toilet to dress, standing with RW to pull pants up with supervision. Throughout tasks, required VCs for redirection to task and pt asking several times throughout session to be re-oriented to time and situation. She was slightly agitated and confused as she feared family did not know she was in hospital as she just learned she was here.  Pt returned to w/c at end of session, left seated with all needs in reach in prep for next tx session. RN made aware of pt's decline in cognitive status.   Therapy Documentation Precautions:  Precautions Precautions: Fall Restrictions Weight Bearing Restrictions: No Pain:   No/ denies pain  See Function Navigator for Current Functional Status.   Therapy/Group: Individual Therapy  Cecile Gillispie L 06/28/2017, 7:13 AM

## 2017-06-28 NOTE — Discharge Summary (Signed)
Discharge summary job [id number]

## 2017-06-28 NOTE — Progress Notes (Signed)
Physical Therapy Discharge Summary  Patient Details  Name: Shelley [name] MRN: [id number] Date of Birth: [birth year]  Today's Date: 06/28/2017 PT Individual Time: 8307-4600 PT Individual Time Calculation (min): 72 min    Patient has met 8 of 8 long term goals due to improved activity tolerance, improved balance, improved postural control, increased strength, increased range of motion, ability to compensate for deficits and improved awareness.  Patient to discharge at an ambulatory level Supervision.   Patient's care partner is independent to provide the necessary physical and cognitive assistance at discharge.  Reasons goals not met: all PT goals met.   Recommendation:  Patient will benefit from ongoing skilled PT services in home health setting to continue to advance safe functional mobility, address ongoing impairments in balance, safety, endurance, transfers, gait, family education and minimize fall risk.  Equipment: No equipment provided  Reasons for discharge: treatment goals met and discharge from hospital  Patient/family agrees with progress made and goals achieved: Yes   PT treatment:  PT instructed pt in Grad day assessment to measure progress toward goals. See below for details. Pt functioning at overall supervision assist for bed mobility, transfers and gait with RW up to 155f. PT instructed pt in car transfer with min assist to step into truck height with running board. Supervision assist to car height. Stair training with supervision assist as stated below. Patient returned to room and left [facility] in reach and all needs met.      PT Discharge Precautions/Restrictions   fall Pain   0/10  Sensation Sensation Light Touch: Appears Intact(reports mild paratheia in the L) Proprioception: Appears Intact Coordination Gross Motor Movements are Fluid and Coordinated: No Fine Motor Movements are Fluid and Coordinated: Yes Coordination and Movement  Description: mild dyskinesia in RLE Motor  Motor Motor: Abnormal postural alignment and control;Other (comment) Motor - Discharge Observations: Generalized weakness and decreased initiation.   Mobility Bed Mobility Bed Mobility: Rolling Right;Rolling Left;Supine to Sit;Sit to Supine Rolling Right: 5: Supervision Rolling Left: 5: Supervision Rolling Left Details: Tactile cues for sequencing Supine to Sit: 5: Supervision Sit to Supine: 5: Supervision Transfers Transfers: Yes Sit to Stand: 5: Supervision Stand to Sit: 5: Supervision Stand Pivot Transfers: 5: Supervision(with RW) Locomotion  Ambulation Ambulation: Yes Ambulation/Gait Assistance: 5: Supervision Ambulation Distance (Feet): 120 Feet Assistive device: Rolling walker Ambulation/Gait Assistance Details: Verbal cues for gait pattern;Verbal cues for precautions/safety Gait Gait: Yes Gait Pattern: Impaired Gait Pattern: Right foot flat;Wide base of support Stairs / Additional Locomotion Stairs: Yes Stair Management Technique: Two rails Number of Stairs: 8 Height of Stairs: 6 Wheelchair Mobility Wheelchair Mobility: No  Trunk/Postural Assessment  Cervical Assessment Cervical Assessment: Exceptions to [facility] Assessment Thoracic Assessment: Exceptions to [facility]um[facility] Assessment Lumbar Assessment: Exceptions to WAl[facility] HospitalPostural Control Postural Control: Deficits on evaluation Righting Reactions: mild delay in standing  Balance Balance Balance Assessed: Yes Static Sitting Balance Static Sitting - Level of Assistance: 6: Modified independent (Device/Increase time) Dynamic Sitting Balance Dynamic Sitting - Level of Assistance: 6: Modified independent (Device/Increase time) Static Standing Balance Static Standing - Level of Assistance: 5: Stand by assistance Dynamic Standing Balance Dynamic Standing - Level of Assistance: 5: Stand by assistance Extremity Assessment      RLE Assessment RLE Assessment: Within  Functional Limits(grossly observed 4+/5 with delayed initiation) LLE Assessment LLE Assessment: Within Functional Limits(grossly observed 4+/5)   See Function Navigator for Current Functional Status.  [name]12/27/2018, 9:46 AM

## 2017-06-28 NOTE — Progress Notes (Signed)
Speech Language Pathology Discharge Summary  Patient Details  Name: Shelley [name] MRN: [id number] Date of Birth: 1945/10/10  Today's Date: 06/28/2017 SLP Individual Time: 1420-1500 SLP Individual Time Calculation (min): 40 min   Skilled Therapeutic Interventions:  Pt was seen for skilled ST targeting cognitive goals and grad day activities.  Pt was oriented to place and situation with min assist question cues but orientation seemed to fluctuate throughout session as pt often referred to being at home.  Pt could recall discharge date with min question cues as well.  SLP administered the [employer]-Basic standardized cognitive assessment to measure progress from initial evaluation.  Pt scored 13/30 on assessment with cognitive impairment generalized across all domains but most markedly in memory, attention, and problem solving.  Pt was returned to room and left in bed with bed alarm set and call bell within reach.  Pt's son arrived as therapist was leaving and was updated regarding pt's current progress in therapies and working towards discharge tomorrow.  Al;l questions were answered to his satisfaction at this time.      Patient has met 4 of 6 long term goals.  Patient to discharge at overall Mod;Min level.  Reasons goals not met:     Clinical Impression/Discharge Summary:  Pt has made slow, functional gains while inpatient and is discharging at an overall min-mod assist level due to moderate cognitive impairment characterized by fluctuating orientation, storage and retrieval of information, selective attention to tasks, problem solving, and awareness of deficits.  Pt is discharging home with 24/7 supervision from family.  Would recommend ST follow up and additional education at next venue of care given inconsistent family attendance and participation in therapy sessions while inpatient.    Care Partner:  Caregiver Able to Provide Assistance: Yes  Type of Caregiver Assistance:  Physical;Cognitive  Recommendation:  Home Health SLP;24 hour supervision/assistance  Rationale for SLP Follow Up: Reduce caregiver burden;Maximize cognitive function and independence   Equipment: none recommended by SLP    Reasons for discharge: Discharged from hospital   Patient/Family Agrees with Progress Made and Goals Achieved: Yes   Function:  Eating Eating                 Cognition Comprehension Comprehension assist level: Understands basic 90% of the time/cues < 10% of the time  Expression   Expression assist level: Expresses basic needs/ideas: With extra time/assistive device  Social Interaction Social Interaction assist level: Interacts appropriately 75 - 89% of the time - Needs redirection for appropriate language or to initiate interaction.  Problem Solving Problem solving assist level: Solves basic 50 - 74% of the time/requires cueing 25 - 49% of the time  Memory Memory assist level: Recognizes or recalls 50 - 74% of the time/requires cueing 25 - 49% of the time   Emilio Math 06/28/2017, 9:09 PM

## 2017-06-28 NOTE — Discharge Summary (Signed)
NAME:  Shelley [name], Shelley [name] NO.:  [id number]  MEDICAL RECORD NO.:  [id number]  LOCATION:                                 FACILITY:  PHYSICIAN:  Charlett Blake, M.D.DATE OF BIRTH:  Oct 04, 1945  DATE OF ADMISSION:  06/13/2017 DATE OF DISCHARGE:                              DISCHARGE SUMMARY   DISCHARGE DIAGNOSES: 1. 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 2. Subcutaneous Lovenox for DVT prophylaxis. 3. Pain management. 4. Seizure disorder. 5. Hypertension.  HISTORY OF PRESENT ILLNESS:  This is a 71 year old right-handed female with history of hypertension and seizure disorder who lives with spouse, independent prior to admission.  Presented on June 08, 2017 with diplopia as well as a fall.  CT of the head unremarkable.  The patient did not receive tPA.  MRI showed acute lacunar infarct in the left dorsal pons, acute lacunar infarcts also in the right cerebellum and right thalamus.  MRA of head and neck unremarkable.  Echocardiogram with ejection fraction of 42% grade 2 diastolic dysfunction.  Maintained on Plavix for CVA prophylaxis.  Subcutaneous Lovenox for DVT prophylaxis. The patient had been maintained on Keppra for history of seizure disorder.  Initial plan for TEE possible loop recorder, however, the patient with burst of SVT returning back to sinus rhythm.  Discussed with Neurology Services.  Plan was to discontinue TEE and no loop recorder was needed.  Physical and occupational therapy ongoing.  The patient was admitted for comprehensive rehab program.  PAST MEDICAL HISTORY:  See discharge diagnoses.  SOCIAL HISTORY:  Lives with spouse, independent prior to admission.  FUNCTIONAL STATUS UPON ADMISSION TO REHAB SERVICES:  Minimal assist, 5 feet rolling walker, minimal assist stand pivot transfers, min mod assist activities of daily living.  PHYSICAL EXAMINATION:  VITAL SIGNS: Blood  pressure 159/90, pulse 66, temperature 98, and respirations 18. GENERAL: Alert female, in no acute distress. HEENT: EOMs intact. NECK:  Supple.  Nontender.  No JVD. CARDIAC: Rate controlled. ABDOMEN: Soft, nontender.  Good bowel sounds. LUNGS: Clear to auscultation without wheeze. NEURO: She followed full commands.  Fair awareness of her deficits.  REHABILITATION HOSPITAL COURSE:  The patient was admitted to Inpatient Rehab Services with therapies initiated on a 3-hour daily basis consisting of physical therapy, occupational therapy, speech therapy, and rehabilitation nursing.  The following issues were addressed during the patient's rehabilitation stay.  Pertaining to Ms. Bauza's acute lacunar infarction in the left dorsal pons, right cerebellum and thalamus remained stable, maintained on Plavix therapy.  She would follow up with Neurology Services.  Subcutaneous Lovenox for DVT prophylaxis.  No bleeding episodes.  She remained on Keppra for history of seizure disorder.  No seizure activity noted.  Blood pressures controlled on lisinopril, Norvasc 10 mg daily as well as Lopressor.  She did have a history of tobacco abuse.  Maintained on a NicoDerm patch.  Receiving full counseling in regard to cessation of nicotine products. Patient with hyperlipidemia maintained on Lipitor.  The patient received weekly collaborative interdisciplinary team conferences to discuss estimated length of stay, family teaching, any barriers to discharge.  She  was ambulating 90 feet to the hospital gift shop rolling walker supervision.  Performed sit to stand, supervision assist.  She can gather her belongings with some safety cues for dressing, grooming and homemaking.  She bathe, seated in tub bench with supervision, returned to wheelchair to dress, able to stand to pull on her pants. Again, she needed some cues overall for her safety which was discussed with spouse.  She could communicate her full needs,  family teaching completed and plan to discharge to home.  DISCHARGE MEDICATIONS:  Included: 1. Plavix 75 mg p.o. daily. 2. Keppra 500 mg p.o. b.i.d. 3. Lisinopril 10 mg p.o. daily. 4. Lopressor 25 mg p.o. b.i.d. 5. NicoDerm patch taper as directed. 6. Norvasc 10 mg daily 7. Lipitor 20 mg daily  DIET:  Her diet was regular consistency.  FOLLOWUP:  She would follow up with Dr. Alysia Penna at the outpatient rehab center as directed; Dr. Antony Contras, Neurology Services called for appointment; and Dr. [name], Medical Management.  SPECIAL INSTRUCTIONS:  No smoking.     Lauraine Rinne, P.A.   ______________________________ Charlett Blake, M.D.    DA/MEDQ  D:  06/28/2017  T:  06/28/2017  Job:  [id number]  cc:   Paula Compton. Willey Blade, MD Pramod P. Leonie Man, MD Charlett Blake, M.D.

## 2017-06-28 NOTE — Progress Notes (Signed)
Pt reports that not feeling well, VS taken and recorded, has refused therapy. Will provide info to PA

## 2017-06-29 MED ORDER — METOPROLOL TARTRATE 25 MG PO TABS: 25.0000 mg | ORAL_TABLET | Freq: Two times a day (BID) | ORAL | 0 refills | Status: DC

## 2017-06-29 MED ORDER — NICOTINE 21 MG/24HR TD PT24: MEDICATED_PATCH | TRANSDERMAL | 0 refills | Status: DC

## 2017-06-29 MED ORDER — LEVETIRACETAM 500 MG PO TABS: 500.0000 mg | ORAL_TABLET | Freq: Two times a day (BID) | ORAL | 0 refills | Status: DC

## 2017-06-29 MED ORDER — AMLODIPINE BESYLATE 10 MG PO TABS: 10.0000 mg | ORAL_TABLET | Freq: Every day | ORAL | 11 refills | Status: DC

## 2017-06-29 MED ORDER — LISINOPRIL 10 MG PO TABS: 10.0000 mg | ORAL_TABLET | Freq: Every day | ORAL | 0 refills | Status: DC

## 2017-06-29 MED ORDER — CLOPIDOGREL BISULFATE 75 MG PO TABS: 75.0000 mg | ORAL_TABLET | Freq: Every day | ORAL | 0 refills | Status: DC

## 2017-06-29 MED ORDER — ATORVASTATIN CALCIUM 20 MG PO TABS: 20.0000 mg | ORAL_TABLET | Freq: Every day | ORAL | 11 refills | Status: DC

## 2017-06-29 NOTE — Progress Notes (Signed)
Social Work Patient ID: Shelley [name], female   DOB: [birth year], 71 y.o.   MRN: [id number]   CSW met with pt and spoke with husband via telephone re: team conference discussion and that team kept pt's d/c on 06-29-17.  Husband is prepared to take her home.  CSW told him that team recommended a RW and tub transfer bench.  Pt has a RW at home already and husband wants CSW to order the tub bench to be delivered to her room.  CSW also set up home health therapy for pt, as well.  CSW remains available to assist as needed.

## 2017-06-29 NOTE — Plan of Care (Signed)
Pt remains incontinent at times and cognition comes and goes.

## 2017-06-29 NOTE — Progress Notes (Signed)
Social Work Discharge Note  The overall goal for the admission was met for:   Discharge location: Yes - home with husband  Length of Stay: Yes - 16 days  Discharge activity level: Yes - supervision overall, but min to mod A for speech tasks  Home/community participation: Yes  Services provided included: MD, RD, PT, OT, SLP, Pharmacy, Neuropsych and SW  Financial Services: Private Insurance: [employer]  Follow-up services arranged: Home Health: PT/OT/ST from [facility], DME: tub transfer bench from [facility] and Patient/Family has no preference for HH/DME agencies  Comments (or additional information): Pt to d/c home with her husband to provide 24/7 supervision.  HH to f/u for ongoing therapies.  Patient/Family verbalized understanding of follow-up arrangements: Yes  Individual responsible for coordination of the follow-up plan: pt's husband  Confirmed correct DME delivered: Trey Sailors 06/29/2017    N[place] Kimple, Silvestre Mesi

## 2017-06-29 NOTE — Discharge Instructions (Signed)
Inpatient Rehab Discharge Instructions  Shelley [name] Discharge date and time: No discharge date for patient encounter.   Activities/Precautions/ Functional Status: Activity: activity as tolerated Diet: regular diet Wound Care: none needed Functional status:  ___ No restrictions     ___ Walk up steps independently ___ 24/7 supervision/assistance   ___ Walk up steps with assistance ___ Intermittent supervision/assistance  ___ Bathe/dress independently ___ Walk with walker     ___ Bathe/dress with assistance ___ Walk Independently    ___ Shower independently ___ Walk with assistance    ___ Shower with assistance ___ No alcohol     ___ Return to work/school ________  COMMUNITY REFERRALS UPON DISCHARGE:   Home Health:   PT     OT     ST         Agency:  [facility] Phone:  [phone] Medical Equipment/Items Ordered:  Tub transfer bench  Agency/Supplier:  [facility]   Phone:  [phone]  GENERAL COMMUNITY RESOURCES FOR PATIENT/FAMILY: Support Groups:  [place] [facility]                              Meets the third Monday of each month from 1:30 to 2:30 pm (next meeting is July 23, 2017)                              At the [facility]  [address], [place]                              For more information, please call Mauricio Po at [phone]  Special Instructions:    My questions have been answered and I understand these instructions. I will adhere to these goals and the provided educational materials after my discharge from the hospital.  Patient/Caregiver Signature _______________________________ Date __________  Clinician Signature _______________________________________ Date __________  Please bring this form and your medication list with you to all your follow-up doctor's appointments. Inpatient Rehab Discharge Instructions  Shelley [name] Discharge date and time: No discharge date for patient encounter.    Activities/Precautions/ Functional Status: Activity: activity as tolerated Diet: regular diet Wound Care: none needed Functional status:  ___ No restrictions     ___ Walk up steps independently ___ 24/7 supervision/assistance   ___ Walk up steps with assistance ___ Intermittent supervision/assistance  ___ Bathe/dress independently ___ Walk with walker     _x STROKE/TIA DISCHARGE INSTRUCTIONS SMOKING Cigarette smoking nearly doubles your risk of having a stroke & is the single most alterable risk factor  If you smoke or have smoked in the last 12 months, you are advised to quit smoking for your health.  Most of the excess cardiovascular risk related to smoking disappears within a year of stopping.  Ask you doctor about anti-smoking medications  [place] Quit Line: 1-800-QUIT NOW  Free Smoking Cessation Classes [phone]  CHOLESTEROL Know your levels; limit fat & cholesterol in your diet  Lipid Panel     Component Value Date/Time   CHOL 177 06/09/2017 0513   TRIG 74 06/09/2017 0513   HDL 61 06/09/2017 0513   CHOLHDL 2.9 06/09/2017 0513   VLDL 15 06/09/2017 0513   LDLCALC 101 (H) 06/09/2017 0513      Many patients benefit from treatment even if their cholesterol is at goal.  Goal: Total Cholesterol (CHOL) less than 160  Goal:  Triglycerides (TRIG) less than 150  Goal:  HDL greater than 40  Goal:  LDL (LDLCALC) less than 100   BLOOD PRESSURE American Stroke Association blood pressure target is less that 120/80 mm/Hg  Your discharge blood pressure is:  BP: 126/87  Monitor your blood pressure  Limit your salt and alcohol intake  Many individuals will require more than one medication for high blood pressure  DIABETES (A1c is a blood sugar average for last 3 months) Goal HGBA1c is under 7% (HBGA1c is blood sugar average for last 3 months)  Diabetes: No known diagnosis of diabetes    Lab Results  Component Value Date   HGBA1C 5.6 06/09/2017     Your HGBA1c can be  lowered with medications, healthy diet, and exercise.  Check your blood sugar as directed by your physician  Call your physician if you experience unexplained or low blood sugars.  PHYSICAL ACTIVITY/REHABILITATION Goal is 30 minutes at least 4 days per week  Activity: Increase activity slowly, Therapies: Physical Therapy: Home Health Return to work:   Activity decreases your risk of heart attack and stroke and makes your heart stronger.  It helps control your weight and blood pressure; helps you relax and can improve your mood.  Participate in a regular exercise program.  Talk with your doctor about the best form of exercise for you (dancing, walking, swimming, cycling).  DIET/WEIGHT Goal is to maintain a healthy weight  Your discharge diet is: Diet Heart Room service appropriate? Yes; Fluid consistency: Thin  liquids Your height is:  Height: 5\' 5"  (165.1 cm) Your current weight is: Weight: 71 kg (156 lb 8.4 oz) Your Body Mass Index (BMI) is:  BMI (Calculated): 26.05  Following the type of diet specifically designed for you will help prevent another stroke.  Your goal weight range is:    Your goal Body Mass Index (BMI) is 19-24.  Healthy food habits can help reduce 3 risk factors for stroke:  High cholesterol, hypertension, and excess weight.  RESOURCES Stroke/Support Group:  Call [phone]   STROKE EDUCATION PROVIDED/REVIEWED AND GIVEN TO PATIENT Stroke warning signs and symptoms How to activate emergency medical system (call 911). Medications prescribed at discharge. Need for follow-up after discharge. Personal risk factors for stroke. Pneumonia vaccine given:  Flu vaccine given:  My questions have been answered, the writing is legible, and I understand these instructions.  I will adhere to these goals & educational materials that have been provided to me after my discharge from the hospital.   __ Bathe/dress with assistance ___ Walk Independently    ___ Shower  independently ___ Walk with assistance    ___ Shower with assistance ___ No alcohol     ___ Return to work/school ________  Special Instructions:    My questions have been answered and I understand these instructions. I will adhere to these goals and the provided educational materials after my discharge from the hospital.  Patient/Caregiver Signature _______________________________ Date __________  Clinician Signature _______________________________________ Date __________  Please bring this form and your medication list with you to all your follow-up doctor's appointments. Inpatient Rehab Discharge Instructions  Shelley [name] Discharge date and time: No discharge date for patient encounter.   Activities/Precautions/ Functional Status: Activity: activity as tolerated Diet: regular diet Wound Care: none needed Functional status:  ___ No restrictions     ___ Walk up steps independently ___ 24/7 supervision/assistance   ___ Walk up steps with  assistance ___ Intermittent supervision/assistance  ___ Bathe/dress independently ___ Walk with walker     ___ Bathe/dress with assistance ___ Walk Independently    ___ Shower independently ___ Walk with assistance    ___ Shower with assistance ___ No alcohol     ___ Return to work/school ________  Special Instructions:    My questions have been answered and I understand these instructions. I will adhere to these goals and the provided educational materials after my discharge from the hospital.  Patient/Caregiver Signature _______________________________ Date __________  Clinician Signature _______________________________________ Date __________  Please bring this form and your medication list with you to all your follow-up doctor's appointments.

## 2017-06-29 NOTE — Progress Notes (Signed)
Subjective/Complaints:    ROS: Denies nausea, vomiting, diarrhea, shortness of breath or chest pain   Objective: Vital Signs: Blood pressure 126/78, pulse 60, temperature 98.7 F (37.1 C), temperature source Oral, resp. rate 17, height '5\' 5"'  (1.651 m), weight 69.1 kg (152 lb 5.4 oz), SpO2 97 %. No results found. Results for orders placed or performed during the hospital encounter of 06/13/17 (from the past 72 hour(s))  Creatinine, serum     Status: None   Collection Time: 06/27/17  5:40 AM  Result Value Ref Range   Creatinine, Ser 0.69 0.44 - 1.00 mg/dL   GFR calc non Af Amer >60 >60 mL/min   GFR calc Af Amer >60 >60 mL/min    Comment: (NOTE) The eGFR has been calculated using the CKD EPI equation. This calculation has not been validated in all clinical situations. eGFR's persistently <60 mL/min signify possible Chronic Kidney Disease.   Comprehensive metabolic panel     Status: Abnormal   Collection Time: 06/28/17  3:58 PM  Result Value Ref Range   Sodium 138 135 - 145 mmol/L   Potassium 4.0 3.5 - 5.1 mmol/L   Chloride 103 101 - 111 mmol/L   CO2 25 22 - 32 mmol/L   Glucose, Bld 113 (H) 65 - 99 mg/dL   BUN 10 6 - 20 mg/dL   Creatinine, Ser 0.69 0.44 - 1.00 mg/dL   Calcium 9.3 8.9 - 10.3 mg/dL   Total Protein 6.3 (L) 6.5 - 8.1 g/dL   Albumin 3.3 (L) 3.5 - 5.0 g/dL   AST 20 15 - 41 U/L   ALT 20 14 - 54 U/L   Alkaline Phosphatase 104 38 - 126 U/L   Total Bilirubin 0.5 0.3 - 1.2 mg/dL   GFR calc non Af Amer >60 >60 mL/min   GFR calc Af Amer >60 >60 mL/min    Comment: (NOTE) The eGFR has been calculated using the CKD EPI equation. This calculation has not been validated in all clinical situations. eGFR's persistently <60 mL/min signify possible Chronic Kidney Disease.    Anion gap 10 5 - 15     General: No acute distress. Vital signs reviewed. Psych: Mood and affect are normal Heart: RRR. No JVD    Lungs: CTA Bilaterally. Normal effort  Abdomen: BS+. Nondistended   Musculoskeletal: No edema. No tenderness. Skin: No evidence of breakdown, no evidence of rash Neurologic:  Motor: 4+/5 in bilateral deltoid, bicep, tricep, grip, hip flexor, knee extensors, ankle dorsiflexor and plantar flexor Fair insight and awareness  Assessment/Plan: 1. Functional deficits secondary to acute lacunar infarct in the left dorsal pons and right cerebellum as well as right thalamus     Stable for D/C today F/u PCP in 3-4 weeks F/u PM&R 2 weeks See D/C summary See D/C instructions FIM: Function - Bathing Position: Shower Body parts bathed by patient: Right arm, Left arm, Abdomen, Chest, Front perineal area, Buttocks, Right upper leg, Left upper leg, Right lower leg, Left lower leg, Back Body parts bathed by helper: Back Assist Level: Supervision or verbal cues  Function- Upper Body Dressing/Undressing What is the patient wearing?: Pull over shirt/dress Pull over shirt/dress - Perfomed by patient: Thread/unthread right sleeve, Put head through opening, Thread/unthread left sleeve, Pull shirt over trunk Button up shirt - Perfomed by patient: Thread/unthread right sleeve, Thread/unthread left sleeve, Pull shirt around back, Button/unbutton shirt Button up shirt - Perfomed by helper: Button/unbutton shirt Assist Level: More than reasonable time Set up : To obtain clothing/put away  Function - Lower Body Dressing/Undressing What is the patient wearing?: Pants, Shoes Position: Other (comment)(Sitting on toilet) Pants- Performed by patient: Thread/unthread right pants leg, Thread/unthread left pants leg, Pull pants up/down Pants- Performed by helper: Thread/unthread right pants leg, Pull pants up/down Non-skid slipper socks- Performed by patient: Don/doff right sock, Don/doff left sock Non-skid slipper socks- Performed by helper: Don/doff right sock, Don/doff left sock Shoes - Performed by patient: Don/doff right shoe, Don/doff left shoe(Slide on) Assist for footwear:  Independent Assist for lower body dressing: Supervision or verbal cues  Function - Toileting Toileting activity did not occur: No continent bowel/bladder event Toileting steps completed by patient: Adjust clothing prior to toileting, Performs perineal hygiene, Adjust clothing after toileting Toileting steps completed by helper: Adjust clothing prior to toileting, Performs perineal hygiene, Adjust clothing after toileting Toileting Assistive Devices: Grab bar or rail Assist level: Supervision or verbal cues  Function - Air cabin crew transfer assistive device: Elevated toilet seat/BSC over toilet, Grab bar Assist level to toilet: Supervision or verbal cues Assist level from toilet: Supervision or verbal cues  Function - Chair/bed transfer Chair/bed transfer method: Stand pivot Chair/bed transfer assist level: Supervision or verbal cues Chair/bed transfer assistive device: Environmental consultant, Armrests Chair/bed transfer details: Verbal cues for precautions/safety, Verbal cues for safe use of DME/AE  Function - Locomotion: Wheelchair Will patient use wheelchair at discharge?: No Type: Manual Wheelchair activity did not occur: N/A Max wheelchair distance: 35 ft Assist Level: Touching or steadying assistance (Pt > 75%) Wheel 50 feet with 2 turns activity did not occur: N/A Wheel 150 feet activity did not occur: N/A Turns around,maneuvers to table,bed, and toilet,negotiates 3% grade,maneuvers on rugs and over doorsills: No Function - Locomotion: Ambulation Assistive device: Walker-rolling Max distance: 122f  Assist level: Supervision or verbal cues Assist level: Supervision or verbal cues Walk 50 feet with 2 turns activity did not occur: Safety/medical concerns Assist level: Supervision or verbal cues Walk 150 feet activity did not occur: Safety/medical concerns Walk 10 feet on uneven surfaces activity did not occur: Safety/medical concerns  Function - Comprehension Comprehension:  Auditory Comprehension assist level: Understands basic 90% of the time/cues < 10% of the time  Function - Expression Expression: Verbal Expression assist level: Expresses basic needs/ideas: With extra time/assistive device  Function - Social Interaction Social Interaction assist level: Interacts appropriately 75 - 89% of the time - Needs redirection for appropriate language or to initiate interaction.  Function - Problem Solving Problem solving assist level: Solves basic 50 - 74% of the time/requires cueing 25 - 49% of the time  Function - Memory Memory assist level: Recognizes or recalls 50 - 74% of the time/requires cueing 25 - 49% of the time Patient normally able to recall (first 3 days only): None of the above  Medical Problem List and Plan: 1.  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  DVT Prophylaxis/Anticoagulation: Subcutaneous Lovenox. PLT nl 12/24 3. Pain Management: Tylenol as needed  4. Mood: Provide emotional support  5. Neuropsych: This patient is  capable of making decisions on her  own behalf.     -memory deficits at times 6. Skin/Wound Care: Routine skin checks  7. Fluids/Electrolytes/Nutrition: Routine I&O's , Intake of fluids low   BMP within acceptable range on 12/13, recheck BMET 12/27 normal     8. Seizure disorder. Keppra 500 mg twice  a day with no seizures since admission to rehabilitation 9. Hypertension. Lopressor 25 mg twice a day, lisinopril 10 mg daily.  Vitals:   06/28/17 1105 06/29/17 0420  BP:  126/78  Pulse: (!) 55 60  Resp:  17  Temp:  98.7 F (37.1 C)  SpO2:  97%     controlled on 12/28  LOS (Days) 16 A FACE TO FACE EVALUATION WAS PERFORMED  Charlett Blake 06/29/2017, 7:37 AM

## 2017-06-30 DIAGNOSIS — Z9181 History of falling: Secondary | ICD-10-CM | POA: Diagnosis not present

## 2017-06-30 DIAGNOSIS — M6281 Muscle weakness (generalized): Secondary | ICD-10-CM | POA: Diagnosis not present

## 2017-06-30 DIAGNOSIS — M810 Age-related osteoporosis without current pathological fracture: Secondary | ICD-10-CM | POA: Diagnosis not present

## 2017-06-30 DIAGNOSIS — Z7901 Long term (current) use of anticoagulants: Secondary | ICD-10-CM | POA: Diagnosis not present

## 2017-06-30 DIAGNOSIS — I69398 Other sequelae of cerebral infarction: Secondary | ICD-10-CM | POA: Diagnosis not present

## 2017-06-30 DIAGNOSIS — I10 Essential (primary) hypertension: Secondary | ICD-10-CM | POA: Diagnosis not present

## 2017-06-30 DIAGNOSIS — R2689 Other abnormalities of gait and mobility: Secondary | ICD-10-CM | POA: Diagnosis not present

## 2017-06-30 DIAGNOSIS — H532 Diplopia: Secondary | ICD-10-CM | POA: Diagnosis not present

## 2017-06-30 LAB — URINE CULTURE

## 2017-07-06 DIAGNOSIS — M6281 Muscle weakness (generalized): Secondary | ICD-10-CM | POA: Diagnosis not present

## 2017-07-06 DIAGNOSIS — H532 Diplopia: Secondary | ICD-10-CM | POA: Diagnosis not present

## 2017-07-06 DIAGNOSIS — R2689 Other abnormalities of gait and mobility: Secondary | ICD-10-CM | POA: Diagnosis not present

## 2017-07-06 DIAGNOSIS — I69398 Other sequelae of cerebral infarction: Secondary | ICD-10-CM | POA: Diagnosis not present

## 2017-07-06 DIAGNOSIS — Z7901 Long term (current) use of anticoagulants: Secondary | ICD-10-CM | POA: Diagnosis not present

## 2017-07-06 DIAGNOSIS — I10 Essential (primary) hypertension: Secondary | ICD-10-CM | POA: Diagnosis not present

## 2017-07-06 DIAGNOSIS — M810 Age-related osteoporosis without current pathological fracture: Secondary | ICD-10-CM | POA: Diagnosis not present

## 2017-07-06 DIAGNOSIS — Z9181 History of falling: Secondary | ICD-10-CM | POA: Diagnosis not present

## 2017-07-11 DIAGNOSIS — Z9181 History of falling: Secondary | ICD-10-CM | POA: Diagnosis not present

## 2017-07-11 DIAGNOSIS — E860 Dehydration: Secondary | ICD-10-CM | POA: Diagnosis not present

## 2017-07-11 DIAGNOSIS — R2689 Other abnormalities of gait and mobility: Secondary | ICD-10-CM | POA: Diagnosis not present

## 2017-07-11 DIAGNOSIS — E785 Hyperlipidemia, unspecified: Secondary | ICD-10-CM | POA: Diagnosis not present

## 2017-07-11 DIAGNOSIS — M810 Age-related osteoporosis without current pathological fracture: Secondary | ICD-10-CM | POA: Diagnosis not present

## 2017-07-11 DIAGNOSIS — M6281 Muscle weakness (generalized): Secondary | ICD-10-CM | POA: Diagnosis not present

## 2017-07-11 DIAGNOSIS — H532 Diplopia: Secondary | ICD-10-CM | POA: Diagnosis not present

## 2017-07-11 DIAGNOSIS — I69398 Other sequelae of cerebral infarction: Secondary | ICD-10-CM | POA: Diagnosis not present

## 2017-07-11 DIAGNOSIS — Z7901 Long term (current) use of anticoagulants: Secondary | ICD-10-CM | POA: Diagnosis not present

## 2017-07-11 DIAGNOSIS — I10 Essential (primary) hypertension: Secondary | ICD-10-CM | POA: Diagnosis not present

## 2017-07-12 ENCOUNTER — Inpatient Hospital Stay (HOSPITAL_COMMUNITY): Payer: Medicare Other

## 2017-07-12 ENCOUNTER — Inpatient Hospital Stay (HOSPITAL_COMMUNITY)
Admission: EM | Admit: 2017-07-12 | Discharge: 2017-07-31 | DRG: 239 | Disposition: A | Discharge status: Hospice | Payer: Medicare Other | Attending: Internal Medicine | Admitting: Internal Medicine

## 2017-07-12 ENCOUNTER — Encounter (HOSPITAL_COMMUNITY): Payer: Self-pay | Admitting: *Deleted

## 2017-07-12 ENCOUNTER — Emergency Department (HOSPITAL_COMMUNITY): Payer: Medicare Other

## 2017-07-12 ENCOUNTER — Encounter (HOSPITAL_COMMUNITY)
Admission: EM | Disposition: A | Discharge status: Hospice | Payer: Self-pay | Source: Home / Self Care | Attending: Internal Medicine

## 2017-07-12 ENCOUNTER — Emergency Department (HOSPITAL_COMMUNITY): Payer: Medicare Other | Admitting: Certified Registered"

## 2017-07-12 ENCOUNTER — Other Ambulatory Visit: Payer: Self-pay

## 2017-07-12 DIAGNOSIS — F1010 Alcohol abuse, uncomplicated: Secondary | ICD-10-CM | POA: Diagnosis not present

## 2017-07-12 DIAGNOSIS — Z9181 History of falling: Secondary | ICD-10-CM | POA: Diagnosis not present

## 2017-07-12 DIAGNOSIS — J9811 Atelectasis: Secondary | ICD-10-CM | POA: Diagnosis not present

## 2017-07-12 DIAGNOSIS — D649 Anemia, unspecified: Secondary | ICD-10-CM | POA: Diagnosis present

## 2017-07-12 DIAGNOSIS — R578 Other shock: Secondary | ICD-10-CM | POA: Diagnosis present

## 2017-07-12 DIAGNOSIS — G40909 Epilepsy, unspecified, not intractable, without status epilepticus: Secondary | ICD-10-CM | POA: Diagnosis present

## 2017-07-12 DIAGNOSIS — I70202 Unspecified atherosclerosis of native arteries of extremities, left leg: Secondary | ICD-10-CM | POA: Diagnosis not present

## 2017-07-12 DIAGNOSIS — Z992 Dependence on renal dialysis: Secondary | ICD-10-CM | POA: Diagnosis not present

## 2017-07-12 DIAGNOSIS — I480 Paroxysmal atrial fibrillation: Secondary | ICD-10-CM | POA: Diagnosis not present

## 2017-07-12 DIAGNOSIS — E440 Moderate protein-calorie malnutrition: Secondary | ICD-10-CM

## 2017-07-12 DIAGNOSIS — R05 Cough: Secondary | ICD-10-CM | POA: Diagnosis not present

## 2017-07-12 DIAGNOSIS — N186 End stage renal disease: Secondary | ICD-10-CM | POA: Diagnosis not present

## 2017-07-12 DIAGNOSIS — Z515 Encounter for palliative care: Secondary | ICD-10-CM

## 2017-07-12 DIAGNOSIS — I481 Persistent atrial fibrillation: Secondary | ICD-10-CM | POA: Diagnosis present

## 2017-07-12 DIAGNOSIS — F0150 Vascular dementia without behavioral disturbance: Secondary | ICD-10-CM | POA: Diagnosis present

## 2017-07-12 DIAGNOSIS — R52 Pain, unspecified: Secondary | ICD-10-CM | POA: Diagnosis not present

## 2017-07-12 DIAGNOSIS — R630 Anorexia: Secondary | ICD-10-CM

## 2017-07-12 DIAGNOSIS — N179 Acute kidney failure, unspecified: Secondary | ICD-10-CM

## 2017-07-12 DIAGNOSIS — M810 Age-related osteoporosis without current pathological fracture: Secondary | ICD-10-CM | POA: Diagnosis present

## 2017-07-12 DIAGNOSIS — Z7189 Other specified counseling: Secondary | ICD-10-CM | POA: Diagnosis not present

## 2017-07-12 DIAGNOSIS — F17210 Nicotine dependence, cigarettes, uncomplicated: Secondary | ICD-10-CM | POA: Diagnosis present

## 2017-07-12 DIAGNOSIS — J849 Interstitial pulmonary disease, unspecified: Secondary | ICD-10-CM | POA: Diagnosis not present

## 2017-07-12 DIAGNOSIS — I749 Embolism and thrombosis of unspecified artery: Secondary | ICD-10-CM | POA: Diagnosis not present

## 2017-07-12 DIAGNOSIS — Z66 Do not resuscitate: Secondary | ICD-10-CM | POA: Diagnosis present

## 2017-07-12 DIAGNOSIS — I743 Embolism and thrombosis of arteries of the lower extremities: Secondary | ICD-10-CM | POA: Diagnosis not present

## 2017-07-12 DIAGNOSIS — I10 Essential (primary) hypertension: Secondary | ICD-10-CM | POA: Diagnosis not present

## 2017-07-12 DIAGNOSIS — I4891 Unspecified atrial fibrillation: Secondary | ICD-10-CM | POA: Diagnosis not present

## 2017-07-12 DIAGNOSIS — I110 Hypertensive heart disease with heart failure: Secondary | ICD-10-CM | POA: Diagnosis present

## 2017-07-12 DIAGNOSIS — I998 Other disorder of circulatory system: Secondary | ICD-10-CM | POA: Diagnosis not present

## 2017-07-12 DIAGNOSIS — Z89612 Acquired absence of left leg above knee: Secondary | ICD-10-CM | POA: Diagnosis not present

## 2017-07-12 DIAGNOSIS — Z6824 Body mass index (BMI) 24.0-24.9, adult: Secondary | ICD-10-CM | POA: Diagnosis not present

## 2017-07-12 DIAGNOSIS — Z90710 Acquired absence of both cervix and uterus: Secondary | ICD-10-CM

## 2017-07-12 DIAGNOSIS — T82898A Other specified complication of vascular prosthetic devices, implants and grafts, initial encounter: Secondary | ICD-10-CM | POA: Diagnosis not present

## 2017-07-12 DIAGNOSIS — D696 Thrombocytopenia, unspecified: Secondary | ICD-10-CM | POA: Diagnosis present

## 2017-07-12 DIAGNOSIS — K5730 Diverticulosis of large intestine without perforation or abscess without bleeding: Secondary | ICD-10-CM | POA: Diagnosis not present

## 2017-07-12 DIAGNOSIS — R0902 Hypoxemia: Secondary | ICD-10-CM | POA: Diagnosis not present

## 2017-07-12 DIAGNOSIS — Z419 Encounter for procedure for purposes other than remedying health state, unspecified: Secondary | ICD-10-CM

## 2017-07-12 DIAGNOSIS — Z8673 Personal history of transient ischemic attack (TIA), and cerebral infarction without residual deficits: Secondary | ICD-10-CM | POA: Diagnosis not present

## 2017-07-12 DIAGNOSIS — Z720 Tobacco use: Secondary | ICD-10-CM | POA: Diagnosis not present

## 2017-07-12 DIAGNOSIS — J90 Pleural effusion, not elsewhere classified: Secondary | ICD-10-CM

## 2017-07-12 DIAGNOSIS — J189 Pneumonia, unspecified organism: Secondary | ICD-10-CM | POA: Diagnosis not present

## 2017-07-12 DIAGNOSIS — R911 Solitary pulmonary nodule: Secondary | ICD-10-CM | POA: Diagnosis present

## 2017-07-12 DIAGNOSIS — R0602 Shortness of breath: Secondary | ICD-10-CM | POA: Diagnosis not present

## 2017-07-12 DIAGNOSIS — E876 Hypokalemia: Secondary | ICD-10-CM | POA: Diagnosis not present

## 2017-07-12 DIAGNOSIS — I70222 Atherosclerosis of native arteries of extremities with rest pain, left leg: Secondary | ICD-10-CM | POA: Diagnosis not present

## 2017-07-12 DIAGNOSIS — I69398 Other sequelae of cerebral infarction: Secondary | ICD-10-CM | POA: Diagnosis not present

## 2017-07-12 DIAGNOSIS — J690 Pneumonitis due to inhalation of food and vomit: Secondary | ICD-10-CM | POA: Diagnosis not present

## 2017-07-12 DIAGNOSIS — I361 Nonrheumatic tricuspid (valve) insufficiency: Secondary | ICD-10-CM | POA: Diagnosis not present

## 2017-07-12 DIAGNOSIS — R1310 Dysphagia, unspecified: Secondary | ICD-10-CM | POA: Diagnosis not present

## 2017-07-12 DIAGNOSIS — J9601 Acute respiratory failure with hypoxia: Secondary | ICD-10-CM | POA: Diagnosis not present

## 2017-07-12 DIAGNOSIS — R569 Unspecified convulsions: Secondary | ICD-10-CM | POA: Diagnosis not present

## 2017-07-12 DIAGNOSIS — I771 Stricture of artery: Secondary | ICD-10-CM | POA: Diagnosis not present

## 2017-07-12 DIAGNOSIS — S78119A Complete traumatic amputation at level between unspecified hip and knee, initial encounter: Secondary | ICD-10-CM | POA: Diagnosis not present

## 2017-07-12 DIAGNOSIS — H532 Diplopia: Secondary | ICD-10-CM | POA: Diagnosis not present

## 2017-07-12 DIAGNOSIS — M62262 Nontraumatic ischemic infarction of muscle, left lower leg: Secondary | ICD-10-CM

## 2017-07-12 DIAGNOSIS — I959 Hypotension, unspecified: Secondary | ICD-10-CM | POA: Diagnosis not present

## 2017-07-12 DIAGNOSIS — I4819 Other persistent atrial fibrillation: Secondary | ICD-10-CM

## 2017-07-12 DIAGNOSIS — I5032 Chronic diastolic (congestive) heart failure: Secondary | ICD-10-CM | POA: Diagnosis present

## 2017-07-12 DIAGNOSIS — I739 Peripheral vascular disease, unspecified: Secondary | ICD-10-CM | POA: Diagnosis not present

## 2017-07-12 DIAGNOSIS — M6281 Muscle weakness (generalized): Secondary | ICD-10-CM | POA: Diagnosis not present

## 2017-07-12 DIAGNOSIS — Z7901 Long term (current) use of anticoagulants: Secondary | ICD-10-CM | POA: Diagnosis not present

## 2017-07-12 DIAGNOSIS — M79605 Pain in left leg: Secondary | ICD-10-CM | POA: Diagnosis not present

## 2017-07-12 DIAGNOSIS — G9340 Encephalopathy, unspecified: Secondary | ICD-10-CM | POA: Diagnosis not present

## 2017-07-12 DIAGNOSIS — Z452 Encounter for adjustment and management of vascular access device: Secondary | ICD-10-CM | POA: Diagnosis not present

## 2017-07-12 DIAGNOSIS — R410 Disorientation, unspecified: Secondary | ICD-10-CM | POA: Diagnosis not present

## 2017-07-12 DIAGNOSIS — R846 Abnormal cytological findings in specimens from respiratory organs and thorax: Secondary | ICD-10-CM | POA: Diagnosis not present

## 2017-07-12 DIAGNOSIS — R2689 Other abnormalities of gait and mobility: Secondary | ICD-10-CM | POA: Diagnosis not present

## 2017-07-12 DIAGNOSIS — F419 Anxiety disorder, unspecified: Secondary | ICD-10-CM | POA: Diagnosis present

## 2017-07-12 DIAGNOSIS — M79672 Pain in left foot: Secondary | ICD-10-CM | POA: Diagnosis present

## 2017-07-12 DIAGNOSIS — I744 Embolism and thrombosis of arteries of extremities, unspecified: Secondary | ICD-10-CM | POA: Diagnosis not present

## 2017-07-12 DIAGNOSIS — R579 Shock, unspecified: Secondary | ICD-10-CM | POA: Diagnosis not present

## 2017-07-12 DIAGNOSIS — R918 Other nonspecific abnormal finding of lung field: Secondary | ICD-10-CM | POA: Diagnosis not present

## 2017-07-12 HISTORY — PX: FEMORAL-POPLITEAL BYPASS GRAFT: SHX937

## 2017-07-12 LAB — CBC
HEMATOCRIT: 40.6 % (ref 36.0–46.0)
Hemoglobin: 13.4 g/dL (ref 12.0–15.0)
MCH: 30.4 pg (ref 26.0–34.0)
MCHC: 33 g/dL (ref 30.0–36.0)
MCV: 92.1 fL (ref 78.0–100.0)
Platelets: 139 10*3/uL — ABNORMAL LOW (ref 150–400)
RBC: 4.41 MIL/uL (ref 3.87–5.11)
RDW: 14.1 % (ref 11.5–15.5)
WBC: 9.9 10*3/uL (ref 4.0–10.5)

## 2017-07-12 LAB — BASIC METABOLIC PANEL
ANION GAP: 10 (ref 5–15)
BUN: 36 mg/dL — ABNORMAL HIGH (ref 6–20)
CALCIUM: 8.1 mg/dL — AB (ref 8.9–10.3)
CO2: 21 mmol/L — ABNORMAL LOW (ref 22–32)
Chloride: 107 mmol/L (ref 101–111)
Creatinine, Ser: 1.09 mg/dL — ABNORMAL HIGH (ref 0.44–1.00)
GFR, EST AFRICAN AMERICAN: 57 mL/min — AB (ref >60)
GFR, EST NON AFRICAN AMERICAN: 49 mL/min — AB (ref >60)
Glucose, Bld: 129 mg/dL — ABNORMAL HIGH (ref 65–99)
Potassium: 3.1 mmol/L — ABNORMAL LOW (ref 3.5–5.1)
Sodium: 138 mmol/L (ref 135–145)

## 2017-07-12 LAB — CBC WITH DIFFERENTIAL/PLATELET
BASOS ABS: 0 10*3/uL (ref 0.0–0.1)
BASOS PCT: 0 %
Eosinophils Absolute: 0 10*3/uL (ref 0.0–0.7)
Eosinophils Relative: 0 %
HEMATOCRIT: 48.4 % — AB (ref 36.0–46.0)
Hemoglobin: 16.1 g/dL — ABNORMAL HIGH (ref 12.0–15.0)
Lymphocytes Relative: 16 %
Lymphs Abs: 2.2 10*3/uL (ref 0.7–4.0)
MCH: 30.6 pg (ref 26.0–34.0)
MCHC: 33.3 g/dL (ref 30.0–36.0)
MCV: 92 fL (ref 78.0–100.0)
MONOS PCT: 7 %
Monocytes Absolute: 0.9 10*3/uL (ref 0.1–1.0)
NEUTROS ABS: 10.4 10*3/uL — AB (ref 1.7–7.7)
NEUTROS PCT: 77 %
Platelets: 176 10*3/uL (ref 150–400)
RBC: 5.26 MIL/uL — AB (ref 3.87–5.11)
RDW: 14.2 % (ref 11.5–15.5)
WBC: 13.6 10*3/uL — ABNORMAL HIGH (ref 4.0–10.5)

## 2017-07-12 LAB — I-STAT CHEM 8, ED
BUN: 49 mg/dL — AB (ref 6–20)
CREATININE: 1.4 mg/dL — AB (ref 0.44–1.00)
Calcium, Ion: 1.05 mmol/L — ABNORMAL LOW (ref 1.15–1.40)
Chloride: 103 mmol/L (ref 101–111)
Glucose, Bld: 123 mg/dL — ABNORMAL HIGH (ref 65–99)
HEMATOCRIT: 49 % — AB (ref 36.0–46.0)
Hemoglobin: 16.7 g/dL — ABNORMAL HIGH (ref 12.0–15.0)
POTASSIUM: 3.8 mmol/L (ref 3.5–5.1)
Sodium: 137 mmol/L (ref 135–145)
TCO2: 21 mmol/L — ABNORMAL LOW (ref 22–32)

## 2017-07-12 LAB — MAGNESIUM: Magnesium: 1.6 mg/dL — ABNORMAL LOW (ref 1.7–2.4)

## 2017-07-12 LAB — I-STAT TROPONIN, ED: TROPONIN I, POC: 0.03 ng/mL (ref 0.00–0.08)

## 2017-07-12 LAB — PHOSPHORUS: Phosphorus: 4 mg/dL (ref 2.5–4.6)

## 2017-07-12 LAB — ABO/RH: ABO/RH(D): A POS

## 2017-07-12 LAB — GLUCOSE, CAPILLARY: Glucose-Capillary: 121 mg/dL — ABNORMAL HIGH (ref 65–99)

## 2017-07-12 LAB — I-STAT CG4 LACTIC ACID, ED: Lactic Acid, Venous: 2.01 mmol/L (ref 0.5–1.9)

## 2017-07-12 SURGERY — BYPASS GRAFT FEMORAL-POPLITEAL ARTERY
Anesthesia: General | Site: Leg Lower | Laterality: Left

## 2017-07-12 MED ORDER — ROCURONIUM BROMIDE 100 MG/10ML IV SOLN
INTRAVENOUS | Status: DC | PRN
  Administered 2017-07-12: 100 mg via INTRAVENOUS

## 2017-07-12 MED ORDER — LORAZEPAM 2 MG/ML IJ SOLN
1.0000 mg | Freq: Four times a day (QID) | INTRAMUSCULAR | Status: AC | PRN
  Administered 2017-07-13: 1 mg via INTRAVENOUS
  Filled 2017-07-12: qty 1

## 2017-07-12 MED ORDER — POTASSIUM CHLORIDE CRYS ER 20 MEQ PO TBCR: 20.0000 meq | EXTENDED_RELEASE_TABLET | Freq: Once | ORAL | Status: DC

## 2017-07-12 MED ORDER — HEPARIN BOLUS VIA INFUSION
2000.0000 [IU] | Freq: Once | INTRAVENOUS | Status: AC
  Administered 2017-07-13: 2000 [IU] via INTRAVENOUS
  Filled 2017-07-12: qty 2000

## 2017-07-12 MED ORDER — LORAZEPAM 1 MG PO TABS
1.0000 mg | ORAL_TABLET | Freq: Four times a day (QID) | ORAL | Status: AC | PRN
  Administered 2017-07-14: 1 mg via ORAL
  Filled 2017-07-12: qty 1

## 2017-07-12 MED ORDER — PANTOPRAZOLE SODIUM 40 MG IV SOLR: 40.0000 mg | INTRAVENOUS | Status: DC

## 2017-07-12 MED ORDER — HEPARIN SODIUM (PORCINE) 5000 UNIT/ML IJ SOLN
INTRAMUSCULAR | Status: DC | PRN
  Administered 2017-07-12: 17:00:00

## 2017-07-12 MED ORDER — ETOMIDATE 2 MG/ML IV SOLN
INTRAVENOUS | Status: DC | PRN
  Administered 2017-07-12: 12 mg via INTRAVENOUS

## 2017-07-12 MED ORDER — GUAIFENESIN-DM 100-10 MG/5ML PO SYRP
15.0000 mL | ORAL_SOLUTION | ORAL | Status: DC | PRN
  Filled 2017-07-12: qty 15

## 2017-07-12 MED ORDER — THIAMINE HCL 100 MG/ML IJ SOLN
100.0000 mg | Freq: Every day | INTRAMUSCULAR | Status: DC
  Administered 2017-07-12 – 2017-07-16 (×5): 100 mg via INTRAVENOUS
  Filled 2017-07-12 (×4): qty 1
  Filled 2017-07-12: qty 2

## 2017-07-12 MED ORDER — LIDOCAINE 2% (20 MG/ML) 5 ML SYRINGE
INTRAMUSCULAR | Status: AC
  Filled 2017-07-12: qty 5

## 2017-07-12 MED ORDER — AMLODIPINE BESYLATE 10 MG PO TABS: 10.0000 mg | ORAL_TABLET | Freq: Every day | ORAL | Status: DC

## 2017-07-12 MED ORDER — PANTOPRAZOLE SODIUM 40 MG PO TBEC: 40.0000 mg | DELAYED_RELEASE_TABLET | Freq: Every day | ORAL | Status: DC

## 2017-07-12 MED ORDER — FENTANYL CITRATE (PF) 250 MCG/5ML IJ SOLN
INTRAMUSCULAR | Status: AC
  Filled 2017-07-12: qty 5

## 2017-07-12 MED ORDER — POTASSIUM CHLORIDE 10 MEQ/50ML IV SOLN
10.0000 meq | INTRAVENOUS | Status: AC
  Administered 2017-07-12 – 2017-07-13 (×4): 10 meq via INTRAVENOUS

## 2017-07-12 MED ORDER — DILTIAZEM HCL 100 MG IV SOLR
5.0000 mg/h | INTRAVENOUS | Status: AC
  Administered 2017-07-12: 5 mg/h via INTRAVENOUS
  Filled 2017-07-12: qty 100

## 2017-07-12 MED ORDER — ALBUMIN HUMAN 5 % IV SOLN
INTRAVENOUS | Status: DC | PRN
  Administered 2017-07-12: 18:00:00 via INTRAVENOUS

## 2017-07-12 MED ORDER — DEXAMETHASONE SODIUM PHOSPHATE 10 MG/ML IJ SOLN
INTRAMUSCULAR | Status: DC | PRN
  Administered 2017-07-12: 10 mg via INTRAVENOUS

## 2017-07-12 MED ORDER — ONDANSETRON HCL 4 MG/2ML IJ SOLN: 4.0000 mg | Freq: Once | INTRAMUSCULAR | Status: DC | PRN

## 2017-07-12 MED ORDER — LABETALOL HCL 5 MG/ML IV SOLN: 10.0000 mg | INTRAVENOUS | Status: DC | PRN

## 2017-07-12 MED ORDER — METOPROLOL TARTRATE 5 MG/5ML IV SOLN
2.0000 mg | INTRAVENOUS | Status: AC | PRN
  Administered 2017-07-20 (×2): 5 mg via INTRAVENOUS
  Filled 2017-07-12 (×2): qty 5

## 2017-07-12 MED ORDER — CEFAZOLIN SODIUM-DEXTROSE 2-3 GM-%(50ML) IV SOLR
INTRAVENOUS | Status: DC | PRN
  Administered 2017-07-12: 2 g via INTRAVENOUS

## 2017-07-12 MED ORDER — ALUM & MAG HYDROXIDE-SIMETH 200-200-20 MG/5ML PO SUSP
15.0000 mL | ORAL | Status: DC | PRN
  Filled 2017-07-12: qty 30

## 2017-07-12 MED ORDER — SODIUM CHLORIDE 0.9 % IV SOLN
INTRAVENOUS | Status: DC
  Administered 2017-07-13: 10 mL/h via INTRAVENOUS

## 2017-07-12 MED ORDER — SUGAMMADEX SODIUM 500 MG/5ML IV SOLN
INTRAVENOUS | Status: DC | PRN
  Administered 2017-07-12: 300 mg via INTRAVENOUS

## 2017-07-12 MED ORDER — PHENYLEPHRINE HCL 10 MG/ML IJ SOLN
INTRAVENOUS | Status: DC | PRN
  Administered 2017-07-12: 50 ug/min via INTRAVENOUS

## 2017-07-12 MED ORDER — PROPOFOL 10 MG/ML IV BOLUS
INTRAVENOUS | Status: AC
  Filled 2017-07-12: qty 20

## 2017-07-12 MED ORDER — FOLIC ACID 5 MG/ML IJ SOLN
1.0000 mg | Freq: Every day | INTRAMUSCULAR | Status: DC
  Administered 2017-07-12 – 2017-07-16 (×5): 1 mg via INTRAVENOUS
  Filled 2017-07-12 (×5): qty 0.2

## 2017-07-12 MED ORDER — CLOPIDOGREL BISULFATE 75 MG PO TABS
75.0000 mg | ORAL_TABLET | Freq: Every day | ORAL | Status: DC
  Administered 2017-07-14: 75 mg via ORAL
  Filled 2017-07-12 (×2): qty 1

## 2017-07-12 MED ORDER — METOPROLOL TARTRATE 5 MG/5ML IV SOLN
2.0000 mg | Freq: Once | INTRAVENOUS | Status: DC
  Filled 2017-07-12: qty 5

## 2017-07-12 MED ORDER — DILTIAZEM HCL 100 MG IV SOLR
5.0000 mg/h | INTRAVENOUS | Status: DC
  Administered 2017-07-12 – 2017-07-13 (×3): 10 mg/h via INTRAVENOUS
  Filled 2017-07-12 (×3): qty 100

## 2017-07-12 MED ORDER — LISINOPRIL 10 MG PO TABS: 10.0000 mg | ORAL_TABLET | Freq: Every day | ORAL | Status: DC

## 2017-07-12 MED ORDER — POLYETHYLENE GLYCOL 3350 17 G PO PACK
17.0000 g | PACK | Freq: Every day | ORAL | Status: DC | PRN
  Administered 2017-07-18 – 2017-07-19 (×2): 17 g via ORAL
  Filled 2017-07-12 (×3): qty 1

## 2017-07-12 MED ORDER — MORPHINE SULFATE (PF) 4 MG/ML IV SOLN
4.0000 mg | INTRAVENOUS | Status: DC | PRN
  Administered 2017-07-13 – 2017-07-15 (×9): 4 mg via INTRAVENOUS
  Filled 2017-07-12 (×10): qty 1

## 2017-07-12 MED ORDER — PHENYLEPHRINE HCL 10 MG/ML IJ SOLN
INTRAMUSCULAR | Status: DC | PRN
  Administered 2017-07-12 (×2): 400 ug via INTRAVENOUS

## 2017-07-12 MED ORDER — NICOTINE 21 MG/24HR TD PT24
21.0000 mg | MEDICATED_PATCH | Freq: Every day | TRANSDERMAL | Status: DC
Start: 2017-07-12 — End: 2017-07-22
  Administered 2017-07-13 – 2017-07-22 (×10): 21 mg via TRANSDERMAL
  Filled 2017-07-12 (×12): qty 1

## 2017-07-12 MED ORDER — PHENOL 1.4 % MT LIQD
1.0000 | OROMUCOSAL | Status: DC | PRN
  Filled 2017-07-12: qty 177

## 2017-07-12 MED ORDER — OXYCODONE-ACETAMINOPHEN 5-325 MG PO TABS
1.0000 | ORAL_TABLET | ORAL | Status: DC | PRN
  Administered 2017-07-15 (×2): 2 via ORAL
  Filled 2017-07-12 (×2): qty 2

## 2017-07-12 MED ORDER — FENTANYL CITRATE (PF) 100 MCG/2ML IJ SOLN: 25.0000 ug | INTRAMUSCULAR | Status: DC | PRN

## 2017-07-12 MED ORDER — SODIUM CHLORIDE 0.9 % IV BOLUS (SEPSIS)
1000.0000 mL | Freq: Once | INTRAVENOUS | Status: AC
  Administered 2017-07-12: 1000 mL via INTRAVENOUS

## 2017-07-12 MED ORDER — SODIUM CHLORIDE 0.9 % IV SOLN
1.0000 g | Freq: Once | INTRAVENOUS | Status: AC
  Administered 2017-07-12: 1 g via INTRAVENOUS
  Filled 2017-07-12: qty 10

## 2017-07-12 MED ORDER — MAGNESIUM OXIDE 400 MG PO TABS: 400.0000 mg | ORAL_TABLET | Freq: Two times a day (BID) | ORAL | Status: DC

## 2017-07-12 MED ORDER — 0.9 % SODIUM CHLORIDE (POUR BTL) OPTIME
TOPICAL | Status: DC | PRN
  Administered 2017-07-12: 2000 mL

## 2017-07-12 MED ORDER — SODIUM CHLORIDE 0.9 % IV SOLN
500.0000 mg | Freq: Two times a day (BID) | INTRAVENOUS | Status: DC
  Administered 2017-07-13 – 2017-07-16 (×8): 500 mg via INTRAVENOUS
  Filled 2017-07-12 (×9): qty 5

## 2017-07-12 MED ORDER — ONDANSETRON HCL 4 MG/2ML IJ SOLN
4.0000 mg | Freq: Four times a day (QID) | INTRAMUSCULAR | Status: DC | PRN
  Administered 2017-07-18: 4 mg via INTRAVENOUS
  Filled 2017-07-12: qty 2

## 2017-07-12 MED ORDER — HYDRALAZINE HCL 20 MG/ML IJ SOLN: 5.0000 mg | INTRAMUSCULAR | Status: DC | PRN

## 2017-07-12 MED ORDER — METOPROLOL TARTRATE 25 MG PO TABS: 25.0000 mg | ORAL_TABLET | Freq: Two times a day (BID) | ORAL | Status: DC

## 2017-07-12 MED ORDER — LACTATED RINGERS IV SOLN
INTRAVENOUS | Status: DC | PRN
  Administered 2017-07-12: 17:00:00 via INTRAVENOUS

## 2017-07-12 MED ORDER — ATORVASTATIN CALCIUM 20 MG PO TABS
20.0000 mg | ORAL_TABLET | Freq: Every day | ORAL | Status: DC
  Administered 2017-07-14 – 2017-07-21 (×7): 20 mg via ORAL
  Filled 2017-07-12 (×10): qty 1

## 2017-07-12 MED ORDER — LEVETIRACETAM 500 MG PO TABS: 500.0000 mg | ORAL_TABLET | Freq: Two times a day (BID) | ORAL | Status: DC

## 2017-07-12 MED ORDER — FENTANYL CITRATE (PF) 100 MCG/2ML IJ SOLN
INTRAMUSCULAR | Status: DC | PRN
  Administered 2017-07-12 (×2): 50 ug via INTRAVENOUS

## 2017-07-12 MED ORDER — DILTIAZEM LOAD VIA INFUSION
INTRAVENOUS | Status: DC | PRN
  Administered 2017-07-12: 5 mg via INTRAVENOUS

## 2017-07-12 MED ORDER — ONDANSETRON HCL 4 MG/2ML IJ SOLN
INTRAMUSCULAR | Status: DC | PRN
  Administered 2017-07-12: 4 mg via INTRAVENOUS

## 2017-07-12 MED ORDER — SUCCINYLCHOLINE CHLORIDE 20 MG/ML IJ SOLN
INTRAMUSCULAR | Status: DC | PRN
  Administered 2017-07-12: 120 mg via INTRAVENOUS

## 2017-07-12 MED ORDER — LIDOCAINE HCL (CARDIAC) 20 MG/ML IV SOLN
INTRAVENOUS | Status: DC | PRN
  Administered 2017-07-12: 40 mg via INTRAVENOUS

## 2017-07-12 MED ORDER — METOPROLOL TARTRATE 5 MG/5ML IV SOLN
INTRAVENOUS | Status: AC
  Filled 2017-07-12: qty 5

## 2017-07-12 MED ORDER — HEPARIN (PORCINE) IN NACL 100-0.45 UNIT/ML-% IJ SOLN
950.0000 [IU]/h | INTRAMUSCULAR | Status: DC
  Administered 2017-07-13: 950 [IU]/h via INTRAVENOUS
  Filled 2017-07-12 (×3): qty 250

## 2017-07-12 SURGICAL SUPPLY — 71 items
ADH SKN CLS APL DERMABOND .7 (GAUZE/BANDAGES/DRESSINGS) ×1
AGENT HMST SPONGE THK3/8 (HEMOSTASIS)
BANDAGE ESMARK 6X9 LF (GAUZE/BANDAGES/DRESSINGS) IMPLANT
BNDG CMPR 9X6 STRL LF SNTH (GAUZE/BANDAGES/DRESSINGS)
BNDG ESMARK 6X9 LF (GAUZE/BANDAGES/DRESSINGS)
CANISTER SUCT 3000ML PPV (MISCELLANEOUS) ×3 IMPLANT
CATH EMB 3FR 80CM (CATHETERS) ×4 IMPLANT
CATH EMB 4FR 80CM (CATHETERS) ×2 IMPLANT
CLIP FOGARTY SPRING 6M (CLIP) ×3 IMPLANT
CLIP VESOCCLUDE MED 24/CT (CLIP) ×3 IMPLANT
CLIP VESOCCLUDE SM WIDE 24/CT (CLIP) ×3 IMPLANT
COVER PROBE W GEL 5X96 (DRAPES) ×3 IMPLANT
CUFF TOURNIQUET SINGLE 24IN (TOURNIQUET CUFF) IMPLANT
CUFF TOURNIQUET SINGLE 34IN LL (TOURNIQUET CUFF) IMPLANT
CUFF TOURNIQUET SINGLE 44IN (TOURNIQUET CUFF) IMPLANT
DERMABOND ADVANCED (GAUZE/BANDAGES/DRESSINGS) ×2
DERMABOND ADVANCED .7 DNX12 (GAUZE/BANDAGES/DRESSINGS) ×1 IMPLANT
DRAIN CHANNEL 15F RND FF W/TCR (WOUND CARE) IMPLANT
DRAPE C-ARM 42X72 X-RAY (DRAPES) ×3 IMPLANT
DRAPE HALF SHEET 40X57 (DRAPES) IMPLANT
DRSG COVADERM 4X6 (GAUZE/BANDAGES/DRESSINGS) ×2 IMPLANT
DRSG COVADERM 4X8 (GAUZE/BANDAGES/DRESSINGS) ×2 IMPLANT
ELECT REM PT RETURN 9FT ADLT (ELECTROSURGICAL) ×3
ELECTRODE REM PT RTRN 9FT ADLT (ELECTROSURGICAL) ×1 IMPLANT
EVACUATOR SILICONE 100CC (DRAIN) IMPLANT
GLOVE BIO SURGEON STRL SZ 6.5 (GLOVE) ×1 IMPLANT
GLOVE BIO SURGEON STRL SZ7 (GLOVE) ×5 IMPLANT
GLOVE BIO SURGEONS STRL SZ 6.5 (GLOVE) ×1
GLOVE BIOGEL PI IND STRL 6.5 (GLOVE) IMPLANT
GLOVE BIOGEL PI IND STRL 7.0 (GLOVE) IMPLANT
GLOVE BIOGEL PI IND STRL 7.5 (GLOVE) ×1 IMPLANT
GLOVE BIOGEL PI INDICATOR 6.5 (GLOVE) ×8
GLOVE BIOGEL PI INDICATOR 7.0 (GLOVE) ×2
GLOVE BIOGEL PI INDICATOR 7.5 (GLOVE) ×2
GLOVE ECLIPSE 7.5 STRL STRAW (GLOVE) ×4 IMPLANT
GOWN STRL REUS W/ TWL LRG LVL3 (GOWN DISPOSABLE) ×3 IMPLANT
GOWN STRL REUS W/ TWL XL LVL3 (GOWN DISPOSABLE) IMPLANT
GOWN STRL REUS W/TWL LRG LVL3 (GOWN DISPOSABLE) ×9
GOWN STRL REUS W/TWL XL LVL3 (GOWN DISPOSABLE) ×3
HEMOSTAT SPONGE AVITENE ULTRA (HEMOSTASIS) IMPLANT
INSERT FOGARTY SM (MISCELLANEOUS) IMPLANT
KIT BASIN OR (CUSTOM PROCEDURE TRAY) ×3 IMPLANT
KIT ROOM TURNOVER OR (KITS) ×3 IMPLANT
MARKER GRAFT CORONARY BYPASS (MISCELLANEOUS) IMPLANT
NS IRRIG 1000ML POUR BTL (IV SOLUTION) ×6 IMPLANT
PACK PERIPHERAL VASCULAR (CUSTOM PROCEDURE TRAY) ×3 IMPLANT
PAD ARMBOARD 7.5X6 YLW CONV (MISCELLANEOUS) ×6 IMPLANT
SET MICROPUNCTURE 5F STIFF (MISCELLANEOUS) IMPLANT
STAPLER VISISTAT 35W (STAPLE) ×2 IMPLANT
STOPCOCK 4 WAY LG BORE MALE ST (IV SETS) IMPLANT
SUT ETHILON 3 0 PS 1 (SUTURE) IMPLANT
SUT GORETEX 5 0 TT13 24 (SUTURE) IMPLANT
SUT GORETEX 6.0 TT13 (SUTURE) IMPLANT
SUT MNCRL AB 4-0 PS2 18 (SUTURE) ×9 IMPLANT
SUT PROLENE 5 0 C 1 24 (SUTURE) ×3 IMPLANT
SUT PROLENE 6 0 BV (SUTURE) ×3 IMPLANT
SUT PROLENE 7 0 BV 1 (SUTURE) ×6 IMPLANT
SUT SILK 2 0 PERMA HAND 18 BK (SUTURE) IMPLANT
SUT SILK 3 0 (SUTURE)
SUT SILK 3-0 18XBRD TIE 12 (SUTURE) IMPLANT
SUT VIC AB 2-0 CT1 27 (SUTURE) ×9
SUT VIC AB 2-0 CT1 TAPERPNT 27 (SUTURE) ×2 IMPLANT
SUT VIC AB 3-0 SH 27 (SUTURE) ×15
SUT VIC AB 3-0 SH 27X BRD (SUTURE) ×3 IMPLANT
SYR 3ML LL SCALE MARK (SYRINGE) ×4 IMPLANT
TAPE UMBILICAL COTTON 1/8X30 (MISCELLANEOUS) IMPLANT
TOWEL GREEN STERILE (TOWEL DISPOSABLE) ×3 IMPLANT
TRAY FOLEY W/METER SILVER 16FR (SET/KITS/TRAYS/PACK) ×3 IMPLANT
TUBING EXTENTION W/L.L. (IV SETS) IMPLANT
UNDERPAD 30X30 (UNDERPADS AND DIAPERS) ×3 IMPLANT
WATER STERILE IRR 1000ML POUR (IV SOLUTION) ×3 IMPLANT

## 2017-07-12 NOTE — Op Note (Addendum)
OPERATIVE NOTE   PROCEDURE: 1. Thrombectomy left femoropopliteal artery, anterior tibial artery, tibioperoneal trunk and peroneal artery 2. Exploration of left calf compartments  PRE-OPERATIVE DIAGNOSIS: threatened left leg   POST-OPERATIVE DIAGNOSIS: same as above   SURGEON: Adele Barthel, MD  ASSISTANT(S): Dagoberto Ligas, PAC   ANESTHESIA: general  ESTIMATED BLOOD LOSS: 50 cc  FINDING(S): 1.  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  Chronic thrombus retrieved from anterior tibial artery, tibioperoneal trunk and peroneal artery: no return of backbleeding 3.  Dead muscle in all compartments 4.  Palpable R femoral artery at end of case  SPECIMEN(S):  none  INDICATIONS:   Shelley [name] is a 72 y.o. female who presents with several days of rest pain.  On exam, her left foot was acutely ischemic with loss of neurologic function.  I recommended to the patient and husband: left leg thrombectomy, possible bypass, possible fasciotomy.  The risk, benefits, and alternative for proposed operations were discussed with the patient and her husband.  The patient's husband is aware the risks include but are not limited to: bleeding, infection, myocardial infarction, stroke, limb loss, nerve damage, complications from fasciotomy, need for additional procedures in the future, wound complications, and inability to salvage the left leg given its advance state of ischemia.  The patient's husband has given consent to proceed.  Additionally, I had explicitly discussed the high likelihood that the left leg was not salvageable and amputation was going to be necessary.  The patient and her husband had NOT given permission for an amputation, so this was not planned for this procedure.   DESCRIPTION: After obtaining full informed written consent, the patient was brought back to the operating room and placed supine upon  the operating table.  The patient received IV antibiotics prior to induction.  A procedure time out was completed and the correct surgical site was verified.  After obtaining adequate anesthesia, the patient was prepped and draped in the standard fashion for: left leg bypass.  I turned my attention to the left calf.  I made an incision one finger width behind the tibia and then dissected down to the fascia with electrocautery.  I opened the fascia with electrocautery.  The calf muscle encountered was not reactive to electrical stimulation.    I dissected out the medial popliteal vein.  This appeared to thrombosed.  I dissected out the popliteal artery.  This artery look abnormal with an echymotic appearance.  I took down the soleus muscle to gain exposure of the division into the anterior tibial artery and tibioperoneal trunk.  This muscle was completely non-reactive to electrical stimulation.  In this process, a small injury to the popliteal vein occurred.  There was no bleeding from the vein and some intravenous thrombus was noted.  I subsequently ligated this medial popliteal vein and transected it to gain additional exposure of the tibioperoneal trunk and its division into peroneal and posterior tibial artery.  I placed vessel loops on the tibial arteries to gain control.  I then made a transverse arteriotomy in the below-the-knee popliteal artery.  There was no bleeding but chronic thrombus was evident.  I passed 3 and 4 Fogarty catheter all the way up to the level of the common femoral artery from the popliteal space.  I extracted chronic thrombus and eventually got back pulsatile bleeding.  I injected heparinized saline into the popliteal artery proximally and put it under  tension.  I then passed the 3 Fogarty down each tibial artery until I got no further thrombus back.  There was no back bleeding from any of these tibial arteries.  I repaired the below-the-knee popliteal artery with  2 running  stitches of 7-0 Prolene.  Prior to completing this repair, I released all vessel loops.  There was no antegrade bleeding suggesting the superficial femoral artery had already clotted and there remained no retrograde bleeding.    At this point, I washed out the popliteal space.  The tissue looked pallorous and dead.  I tested the two posterior compartments and the muscles appeared non-reactive to electric stimulation, suggesting they were dead.  The popliteal space was reapproximated with a 3-0 Vicryl in the subcutaneous tissue.  The skin was stapled.  I then turned my attention to the lateral calf.  I made an incision halfway between the anterior and lateral compartments.  Using electrocautery, I dissected down the two compartments.  I made an opening the fascia of both compartments and tested the muscle in both compartments: no muscle stimulation was noted, suggesting the muscles were dead also.  The skin was reapproximated with staples.  Normally, I would proceed with a left above-knee amputation but neither the patient nor her husband gave any permission for such.   COMPLICATIONS: none  CONDITION: stable   Adele Barthel, MD, [facility] [place] Office: [phone] Pager: [phone]  07/12/2017, 6:53 PM

## 2017-07-12 NOTE — Progress Notes (Signed)
ANTICOAGULATION CONSULT NOTE - Initial Consult  Pharmacy Consult:  Heparin Indication:  New-onset Afib  Allergies  Allergen Reactions  . Acyclovir And Related     Patient Measurements: Height: 5\' 5"  (165.1 cm) Weight: 152 lb 5.4 oz (69.1 kg) IBW/kg (Calculated) : 57 Heparin Dosing Weight: 69 kg  Vital Signs: Temp: 94.6 F (34.8 C) (01/10 2057) Temp Source: Oral (01/10 2057) BP: 109/86 (01/10 2015) Pulse Rate: 100 (01/10 2042)  Labs: Recent Labs    07/12/17 1545 07/12/17 1553 07/12/17 1950  HGB 16.1* 16.7* 13.4  HCT 48.4* 49.0* 40.6  PLT 176  --  139*  CREATININE  --  1.40* 1.09*    Estimated Creatinine Clearance: 45.5 mL/min (A) (by C-G formula based on SCr of 1.09 mg/dL (H)).   Medical History: Past Medical History:  Diagnosis Date  . Alcohol abuse    drinks awhile   . Anxiety   . CVA (cerebral infarction)    pt denies  . Hypertension   . Osteoporosis   . Seizures ([place])       Assessment: 35 YOF presented with leg pain/discoloration and claudication, now s/p thrombectomy on 07/12/17.  Pharmacy consulted to initiate IV heparin for new-onset Afib at midnight.  Noted that patient had a stroke on 06/08/18 and MD okay with bolusing heparin.  Hemoglobin/hematocrit WNL, platelet count is slightly low.  No bleeding reported.   Goal of Therapy:  Heparin level 0.3-0.7 units/ml Monitor platelets by anticoagulation protocol: Yes    Plan:  At 12MN, start IV heparin at 950 units/hr after a reduced bolus of 2000 units Check 8 hr heparin level Daily heparin level and CBC   Livian Vanderbeck D. Mina Marble, PharmD, BCPS Pager:  [phone] 07/12/2017, 9:26 PM

## 2017-07-12 NOTE — Anesthesia Preprocedure Evaluation (Signed)
Anesthesia Evaluation  Patient identified by MRN, date of birth, ID band Patient confused    Reviewed: Patient's Chart, lab work & pertinent test results, Unable to perform ROS - Chart review only  Airway Mallampati: II  TM Distance: >3 FB     Dental  (+) Partial Upper   Pulmonary Current Smoker,    breath sounds clear to auscultation + decreased breath sounds      Cardiovascular hypertension,  Rhythm:Irregular Rate:Tachycardia     Neuro/Psych    GI/Hepatic   Endo/Other    Renal/GU      Musculoskeletal   Abdominal   Peds  Hematology   Anesthesia Other Findings   Reproductive/Obstetrics                             Anesthesia Physical Anesthesia Plan  ASA: IV and emergent  Anesthesia Plan: General   Post-op Pain Management:    Induction: Intravenous  PONV Risk Score and Plan: 1 and Ondansetron and Dexamethasone  Airway Management Planned: Oral ETT  Additional Equipment: Arterial line  Intra-op Plan:   Post-operative Plan: Extubation in OR  Informed Consent: I have reviewed the patients History and Physical, chart, labs and discussed the procedure including the risks, benefits and alternatives for the proposed anesthesia with the patient or authorized representative who has indicated his/her understanding and acceptance.   Dental advisory given  Plan Discussed with: CRNA and Anesthesiologist  Anesthesia Plan Comments:         Anesthesia Quick Evaluation

## 2017-07-12 NOTE — Anesthesia Procedure Notes (Signed)
Central Venous Catheter Insertion Performed by: Roberts Gaudy, MD, anesthesiologist Start/End1/04/2018 5:35 PM, 07/12/2017 5:46 PM Preanesthetic checklist: patient identified, IV checked, site marked, risks and benefits discussed, surgical consent, monitors and equipment checked, pre-op evaluation and timeout performed Position: supine Hand hygiene performed  and maximum sterile barriers used  Catheter size: 8 Fr Central line was placed.Double lumen Procedure performed using ultrasound guided technique. Ultrasound Notes:anatomy identified, needle tip was noted to be adjacent to the nerve/plexus identified and no ultrasound evidence of intravascular and/or intraneural injection Attempts: 1 Following insertion, dressing applied, Biopatch and line sutured. Post procedure assessment: blood return through all ports, free fluid flow and no air  Patient tolerated the procedure well with no immediate complications.

## 2017-07-12 NOTE — Consult Note (Signed)
Cardiology Consultation:   [name] ID: Shelley [name]; [id number]; 12-12-1945   Admit date: 07/12/2017 Date of Consult: 07/12/2017  Primary Care Provider: [name], MD Primary Cardiologist: Dr. Lovena Le   [name] Profile:   Shelley [name] is a 72 y.o. 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 Bridgett Larsson.  [name] admitted on 06/08/2017 with double vision, difficulty with ambulation. Work up reveled Bilateral infarcts in multiple areas-embolic-source unknown.  No thrombus on transthoracic echocardiogram.  Did not fell need of transesophageal echocardiogram. Initial plan for TEE possible loop recorder however [name] with burst of SVT returning back to sinus rhythm discussed with neurology services and plan was to discontinue TEE no loop recorder needed.   Discharge from inpatient rehb 06/28/17.   History of Present Illness:  Shelley [name] presented with left foot pain x few days.  She is limited as [name] is recovering from anesthesia.  No family at bedside.  History obtained from reviewing chart.  She has severe discoloration of left leg. Shelley [name] was taken to Thrombectomy left femoropopliteal artery, anterior tibial artery, tibioperoneal trunk and peroneal artery however not good blood flow. Likely need amputation.  During surgery [name] went into A. fib with RVR at rate of 130 bpm.  Now her rate improved to 80s on IV Cardizem at 10 g/h.  Creatinine 1.4.  Lactic acid 2.1. CXR showed IMPRESSION: Left IJ central line in place with tip at Shelley level of Shelley mid/upper SVC. No pneumothorax.  Past Medical History:  Diagnosis Date  . Alcohol abuse    drinks awhile   . Anxiety   . CVA (cerebral infarction)    pt denies  . Hypertension   . Osteoporosis   . Seizures ([place])     Past Surgical History:  Procedure Laterality Date  .  ABDOMINAL HYSTERECTOMY    . BREAST LUMPECTOMY    . COLONOSCOPY WITH PROPOFOL  06/04/2012   Procedure: COLONOSCOPY WITH PROPOFOL;  Surgeon: Danie Binder, MD;  Location: [facility];  Service: Endoscopy;  Laterality: N/A;  entered cecum @ 0752; total cecal withdrawal time = 13 minutes  . EMPYEMA DRAINAGE    . FLEXIBLE SIGMOIDOSCOPY  03/14/99   internal hemorrhoids  . OOPHORECTOMY    . POLYPECTOMY  06/04/2012   Procedure: POLYPECTOMY;  Surgeon: Danie Binder, MD;  Location: [facility];  Service: Endoscopy;  Laterality: N/A;  descending colon polyps x2; rectal polyp x1      Inpatient Medications: Scheduled Meds: . metoprolol tartrate      . metoprolol tartrate  2 mg Intravenous Once   Continuous Infusions:  PRN Meds: fentaNYL (SUBLIMAZE) injection, ondansetron (ZOFRAN) IV  Allergies:    Allergies  Allergen Reactions  . Acyclovir And Related     Social History:   Social History   Socioeconomic History  . Marital status: Married    Spouse name: Not on file  . Number of children: Not on file  . Years of education: Not on file  . Highest education level: Not on file  Social Needs  . Financial resource strain: Not on file  . Food insecurity - worry: Not on file  . Food insecurity - inability: Not on file  . Transportation needs - medical: Not on file  . Transportation needs - non-medical: Not on file  Occupational History  . Not on file  Tobacco Use  . Smoking status: Current Some Day Smoker    Packs/day: 0.50    Types: Cigarettes  . Smokeless tobacco: Never Used  Substance and Sexual Activity  . Alcohol use: No    Comment: hx of ETOH abuse  . Drug use: No  . Sexual activity: Not on file  Other Topics Concern  . Not on file  Social History Narrative  . Not on file    Family History:    Family History  Problem Relation Age of Onset  . Colon cancer Neg Hx      ROS:  Please see Shelley history of present illness.  ROS All other ROS reviewed and negative.     Physical  Exam/Data:   Vitals:   07/12/17 1545 07/12/17 1600 07/12/17 1643 07/12/17 1915  BP: (!) 79/68 (!) 84/72  103/80  Pulse:    78  Resp: 20 (!) 23  (!) 26  Temp:    (!) 97 F (36.1 C)  SpO2:    93%  Weight:   152 lb 5.4 oz (69.1 kg)   Height:   5\' 5"  (1.651 m)     Intake/Output Summary (Last 24 hours) at 07/12/2017 1937 Last data filed at 07/12/2017 1917 Gross per 24 hour  Intake 1250 ml  Output 150 ml  Net 1100 ml   Filed Weights   07/12/17 1643  Weight: 152 lb 5.4 oz (69.1 kg)   Body mass index is 25.35 kg/m.  General: Ill-appearing elderly female lethargic in no acute distress HEENT: normal Lymph: no adenopathy Neck: no JVD Endocrine:  No thryomegaly Vascular: No carotid bruits;  Cardiac: Irregularly irregular l S1, S2; RRR; no murmur  Lungs:  clear to auscultation bilaterally, no wheezing, rhonchi or rales  Abd: soft, nontender, no hepatomegaly  Ext: no edema Musculoskeletal:  No deformities, BUE and BLE strength normal and equal Skin: Severe discoloration of left leg with cold extremities, pulse is not palpable on left leg Neuro: No focal abnormality Psych: sedated   EKG:  Shelley EKG was personally reviewed and demonstrates: AFibrillation at rate of 114 bpm Telemetry:  Telemetry was personally reviewed and demonstrates: She had fibrillation at rate of 80s  Relevant CV Studies:   Echo 06/09/17 Study Conclusions  - Left ventricle: Shelley cavity size was normal. Wall thickness was   increased in a pattern of mild LVH. Systolic function was normal.   Shelley estimated ejection fraction was in Shelley range of 55% to 60%.   Features are consistent with a pseudonormal left ventricular   filling pattern, with concomitant abnormal relaxation and   increased filling pressure (grade 2 diastolic dysfunction). - Right atrium: Shelley atrium was mildly dilated. - Atrial septum: No defect or patent foramen ovale was identified.  Laboratory Data:  Chemistry Recent Labs  Lab  07/12/17 1553  NA 137  K 3.8  CL 103  GLUCOSE 123*  BUN 49*  CREATININE 1.40*    No results for input(s): PROT, ALBUMIN, AST, ALT, ALKPHOS, BILITOT in Shelley last 168 hours. Hematology Recent Labs  Lab 07/12/17 1545 07/12/17 1553  WBC 13.6*  --   RBC 5.26*  --   HGB 16.1* 16.7*  HCT 48.4* 49.0*  MCV 92.0  --   MCH 30.6  --   MCHC 33.3  --   RDW 14.2  --   PLT 176  --    Cardiac EnzymesNo results for input(s): TROPONINI in Shelley last 168 hours.  Recent Labs  Lab 07/12/17 1552  TROPIPOC 0.03    BNPNo results for input(s): BNP, PROBNP in Shelley last 168 hours.  DDimer No results for input(s): DDIMER in Shelley last 168 hours.  Radiology/Studies:  Dg Chest Port 1 View  Result Date: 07/12/2017 CLINICAL DATA:  CVL PLACEMENT-LEFT IJ EXAM: PORTABLE CHEST 1 VIEW COMPARISON:  Chest x-ray from earlier same day. FINDINGS: Left IJ central line appears adequately positioned with tip at Shelley level of Shelley mid/upper SVC. Heart size and mediastinal contours are stable. Nodular density was described in Shelley right upper lung on earlier chest x-ray, not as well seen on today's study which may be related to oblique [name] positioning. No pneumothorax seen. IMPRESSION: Left IJ central line in place with tip at Shelley level of Shelley mid/upper SVC. No pneumothorax. Electronically Signed   By: Franki Cabot M.D.   On: 07/12/2017 19:23   Dg Chest Portable 1 View  Result Date: 07/12/2017 CLINICAL DATA:  Coughing congestion. EXAM: PORTABLE CHEST 1 VIEW COMPARISON:  06/08/2017 FINDINGS: 1534 hours. Persistent nodular density in Shelley right upper lung. Left base atelectasis or scarring noted. No pulmonary edema or substantial pleural effusion. Cardiopericardial silhouette is at upper limits of normal for size. Shelley visualized bony structures of Shelley thorax are intact. Telemetry leads overlie Shelley chest. IMPRESSION: Persistent irregular nodular density in Shelley right upper lobe. CT chest without contrast recommended to further  evaluate. Left base atelectasis or scarring. Electronically Signed   By: Misty Stanley M.D.   On: 07/12/2017 15:48    Assessment and Plan:   1. Atrial fibrillation at rapid ventricular rate -New onset.  Rate improved to 80s on IV Cardizem 10mg  /hours. CHADSVASC score of 5 (AGE,sex, HTN and CVA).  IV heparin for anticoagulation. -Seems her recent embolic stroke and left leg embolization are due to atrial fibrillation.   For questions or updates, please contact [place] Please consult [email or web address] for contact info under Cardiology/STEMI.   Jarrett Soho, [place]  07/12/2017 7:37 PM   Shelley [name] was seen and examined, and I agree with Shelley history, physical exam, assessment and plan as documented above, with modifications as noted below. I have also personally reviewed all relevant documentation, old records, labs, and both radiographic and cardiovascular studies. I have also independently interpreted old and new ECG's.  72 year old woman who was hospitalized in early to mid December for acute multiple posterior cerebral circulation strokes all deemed secondary to lacunar infarcts from small vessel disease.  Prior to this when it was unclear if she had cryptogenic strokes and possible atrial fibrillation, a TEE and implantable loop recorder were considered when she was evaluated by Dr. Lovena Le in consultation. She was discharged on metoprolol, Lipitor, and Plavix. She was then admitted for inpatient rehabilitation and discharged on June 28, 2017.  She then came to [facility] ED today with left foot and leg pain and discoloration. She was taken to [facility] OR by vascular surgery for threatened left leg and underwent thrombectomy of Shelley left femoral popliteal artery, anterior tibial artery, tibioperoneal trunk and peroneal artery.  There was chronic thrombus in Shelley femoral popliteal artery and anterior tibial artery, tibioperoneal trunk, and peroneal artery.  Shelley muscle was found to be dead  in all compartments. Vascular surgery would have ordinarily performed a left above-Shelley-knee amputation but consent was not obtained from either Shelley [name] or her husband.  Recommendations: She was started on intravenous diltiazem with good heart rate control, currently in Shelley 80 bpm range.  I will start IV heparin for anticoagulation  given Shelley likelihood of a left above-Shelley-knee amputation in Shelley near future.  I suspect she has had embolization to her left leg due to atrial fibrillation.   Kate Sable, MD, [facility]  07/12/2017 8:22 PM

## 2017-07-12 NOTE — Anesthesia Postprocedure Evaluation (Signed)
Anesthesia Post Note  Patient: Shelley [name]  Procedure(s) Performed: Left Leg Popliteal, Superficial Femoral Artery, Comon Femoral Artery, Anterior Peroneal Artery Thrombectomies. (Left Leg Lower)     Patient location during evaluation: PACU Anesthesia Type: General Level of consciousness: awake and alert Pain management: pain level controlled Vital Signs Assessment: post-procedure vital signs reviewed and stable Respiratory status: spontaneous breathing, nonlabored ventilation, respiratory function stable and patient connected to nasal cannula oxygen Cardiovascular status: blood pressure returned to baseline and stable Postop Assessment: no apparent nausea or vomiting Anesthetic complications: no    Last Vitals:  Vitals:   07/12/17 2015 07/12/17 2057  BP: 109/86   Pulse: 72   Resp: 19   Temp: (!) 36.1 C (!) 34.8 C  SpO2: 100%     Last Pain:  Vitals:   07/12/17 2057  TempSrc: Oral  PainSc:                  Effie Berkshire

## 2017-07-12 NOTE — Transfer of Care (Signed)
Immediate Anesthesia Transfer of Care Note  Patient: LACRESHA FUSILIER  Procedure(s) Performed: Left Leg Popliteal, Superficial Femoral Artery, Comon Femoral Artery, Anterior Peroneal Artery Thrombectomies. (Left Leg Lower)  Patient Location: PACU  Anesthesia Type:General  Level of Consciousness: sedated and responds to stimulation  Airway & Oxygen Therapy: Patient Spontanous Breathing and Patient connected to face mask oxygen  Post-op Assessment: Report given to RN, Post -op Vital signs reviewed and stable and Patient moving all extremities  Post vital signs: Reviewed and stable  Last Vitals:  Vitals:   07/12/17 1545 07/12/17 1600  BP: (!) 79/68 (!) 84/72  Resp: 20 (!) 23  SpO2:      Last Pain:  Vitals:   07/12/17 1510  PainSc: 8          Complications: No apparent anesthesia complications

## 2017-07-12 NOTE — Anesthesia Procedure Notes (Signed)
Arterial Line Insertion Start/End1/04/2018 5:10 PM, 07/12/2017 5:15 PM Performed by: Roberts Gaudy, MD, anesthesiologist  Patient location: Pre-op. Preanesthetic checklist: patient identified, IV checked, site marked, risks and benefits discussed, surgical consent, monitors and equipment checked, pre-op evaluation and timeout performed Left, brachial was placed Catheter size: 20 G Hand hygiene performed   Attempts: 1 Procedure performed without using ultrasound guided technique. Following insertion, line sutured, dressing applied and Biopatch. Patient tolerated the procedure well with no immediate complications.

## 2017-07-12 NOTE — Consult Note (Addendum)
Requested by:  Dr. Lacinda Axon [facility] ED)  Reason for consultation: left foot ischemia    History of Present Illness   Shelley [name] is a 72 y.o. (1945/09/05) female with multiple co-morbidities including recent B CVA who presents with cc: left foot pain.  Patient's memory and cognitive function is limited per her husband, so portions of this history are reconstructed from the EMR and the patient.  The patient is unclear when her left leg pain began.  She says a "few days ago."  The husband notes the patient has been complaining at night of left foot pain over the last two nights.  It is unclear whether the patient previously had any intermittent claudication or rest pain.  Over the last day reportedly she has had marked skin change in the left foot and calf with now paralysis and anesthesia.  She also notes a general sense of poor health with fatigue and repeated coughing.  .  Past Medical History:  Diagnosis Date  . Alcohol abuse    drinks awhile   . Anxiety   . CVA (cerebral infarction)    pt denies  . Hypertension   . Osteoporosis   . Seizures ([place])     Past Surgical History:  Procedure Laterality Date  . ABDOMINAL HYSTERECTOMY    . BREAST LUMPECTOMY    . COLONOSCOPY WITH PROPOFOL  06/04/2012   Procedure: COLONOSCOPY WITH PROPOFOL;  Surgeon: Danie Binder, MD;  Location: [facility];  Service: Endoscopy;  Laterality: N/A;  entered cecum @ 0752; total cecal withdrawal time = 13 minutes  . EMPYEMA DRAINAGE    . FLEXIBLE SIGMOIDOSCOPY  03/14/99   internal hemorrhoids  . OOPHORECTOMY    . POLYPECTOMY  06/04/2012   Procedure: POLYPECTOMY;  Surgeon: Danie Binder, MD;  Location: [facility];  Service: Endoscopy;  Laterality: N/A;  descending colon polyps x2; rectal polyp x1     Social History   Socioeconomic History  . Marital status: Married    Spouse name: Not on file  . Number of children: Not on file  . Years of education: Not on file  . Highest education level: Not on file  Social  Needs  . Financial resource strain: Not on file  . Food insecurity - worry: Not on file  . Food insecurity - inability: Not on file  . Transportation needs - medical: Not on file  . Transportation needs - non-medical: Not on file  Occupational History  . Not on file  Tobacco Use  . Smoking status: Current Some Day Smoker    Packs/day: 0.50    Types: Cigarettes  . Smokeless tobacco: Never Used  Substance and Sexual Activity  . Alcohol use: No    Comment: hx of ETOH abuse  . Drug use: No  . Sexual activity: Not on file  Other Topics Concern  . Not on file  Social History Narrative  . Not on file    Family History  Problem Relation Age of Onset  . Colon cancer Neg Hx     No current facility-administered medications for this encounter.    Current Outpatient Medications  Medication Sig Dispense Refill  . amLODipine (NORVASC) 10 MG tablet Take 1 tablet (10 mg total) by mouth daily. 30 tablet 11  . atorvastatin (LIPITOR) 20 MG tablet Take 1 tablet (20 mg total) by mouth daily. 30 tablet 11  . clopidogrel (PLAVIX) 75 MG tablet Take 1 tablet (75 mg total) by mouth daily. 30 tablet 0  .  levETIRAcetam (KEPPRA) 500 MG tablet Take 1 tablet (500 mg total) by mouth 2 (two) times daily. 60 tablet 0  . lisinopril (PRINIVIL,ZESTRIL) 10 MG tablet Take 1 tablet (10 mg total) by mouth daily. 30 tablet 0  . magnesium oxide (MAG-OX) 400 MG tablet Take 1 tablet (400 mg total) by mouth 2 (two) times daily. 60 tablet 0  . metoprolol tartrate (LOPRESSOR) 25 MG tablet Take 1 tablet (25 mg total) by mouth 2 (two) times daily. 60 tablet 0  . nicotine (NICODERM CQ - DOSED IN MG/24 HOURS) 21 mg/24hr patch 21 mg patch daily times one week then 14 mg patch daily 3 weeks then 7 mg patch daily 3 weeks and stop 28 patch 0    Allergies  Allergen Reactions  . Acyclovir And Related     REVIEW OF SYSTEMS (negative unless checked):   Cardiac:  []  Chest pain or chest pressure? [x]  Shortness of breath upon  activity? [x]  Shortness of breath when lying flat? []  Irregular heart rhythm?  Vascular:  []  Pain in calf, thigh, or hip brought on by walking? []  Pain in feet at night that wakes you up from your sleep? []  Blood clot in your veins? []  Leg swelling?  Pulmonary:  []  Oxygen at home? [x]  Productive cough? []  Wheezing?  Neurologic:  [x]  Sudden weakness in arms or legs? [x]  Sudden numbness in arms or legs? []  Sudden onset of difficult speaking or slurred speech? []  Temporary loss of vision in one eye? []  Problems with dizziness?  Gastrointestinal:  []  Blood in stool? []  Vomited blood?  Genitourinary:  []  Burning when urinating? []  Blood in urine?  Psychiatric:  []  Major depression  Hematologic:  []  Bleeding problems? []  Problems with blood clotting?  Dermatologic:  []  Rashes or ulcers?  Constitutional:  []  Fever or chills?  Ear/Nose/Throat:  []  Change in hearing? []  Nose bleeds? []  Sore throat?  Musculoskeletal:  []  Back pain? []  Joint pain? [x]  Muscle pain?   Physical Examination     Vitals:   07/12/17 1504 07/12/17 1511  BP:  (!) 86/59  Resp:  (!) 22  SpO2: 96%    There is no height or weight on file to calculate BMI.  General Alert, Confused, WD, Ill appearing  Head [place]/AT,    Ear/Nose/ Throat Hearing grossly intact, nares without erythema or drainage, oropharynx without Erythema or Exudate, Mallampati score: 3,   Eyes PERRL, tracks with eyes,    Neck Supple, mid-line trachea,    Pulmonary Sym exp, Decreased BLL air movt, rhonchi on B bases  Cardiac Irregularly, irregular rate and rhythm, no Murmurs, No rubs, No S3,S4  Vascular Vessel Right Left  Radial Faintly palpable Faintly palpable  Brachial Faintly palpable Faintly palpable  Carotid Palpable, No Bruit Palpable, No Bruit  Aorta Not palpable N/A  Femoral Faintly palpable Faintly palpable  Popliteal Not palpable Not palpable  PT Not palpable Not palpable  DP Faintly palpable Not  palpable    Gastro- intestinal soft, non-distended, non-tender to palpation, No guarding or rebound, no HSM, no masses, no CVAT B, No palpable prominent aortic pulse, Surgical incisions well healed  Musculo- skeletal M/S 3-5/5 BUE and RLE due to poor effect, No knee extension or flex due to pain, no DF/PF, L leg looks dead from mid-calf distal with blistering of skin (see images below)  Neurologic Pain and light touch intact in extremities except for no sensation in L foot, Motor exam as listed above  Psychiatric Judgement not intact, Mood &  affect appropriate for pt's clinical situation  Dermatologic See M/S exam for extremity exam, No rashes otherwise noted  Lymphatic  Palpable lymph nodes: None           Laboratory   CBC CBC Latest Ref Rng & Units 07/12/2017 06/25/2017 06/14/2017  WBC 4.0 - 10.5 K/uL - 6.7 8.4  Hemoglobin 12.0 - 15.0 g/dL 16.7(H) 15.1(H) 14.7  Hematocrit 36.0 - 46.0 % 49.0(H) 46.2(H) 47.3(H)  Platelets 150 - 400 K/uL - 228 147(L)    BMP BMP Latest Ref Rng & Units 07/12/2017 06/28/2017 06/27/2017  Glucose 65 - 99 mg/dL 123(H) 113(H) -  BUN 6 - 20 mg/dL 49(H) 10 -  Creatinine 0.44 - 1.00 mg/dL 1.40(H) 0.69 0.69  Sodium 135 - 145 mmol/L 137 138 -  Potassium 3.5 - 5.1 mmol/L 3.8 4.0 -  Chloride 101 - 111 mmol/L 103 103 -  CO2 22 - 32 mmol/L - 25 -  Calcium 8.9 - 10.3 mg/dL - 9.3 -    Coagulation Lab Results  Component Value Date   INR 1.02 06/08/2017   No results found for: PTT  Lipids    Component Value Date/Time   CHOL 177 06/09/2017 0513   TRIG 74 06/09/2017 0513   HDL 61 06/09/2017 0513   CHOLHDL 2.9 06/09/2017 0513   VLDL 15 06/09/2017 0513   LDLCALC 101 (H) 06/09/2017 0513    Radiology     Dg Chest Portable 1 View  Result Date: 07/12/2017 CLINICAL DATA:  Coughing congestion. EXAM: PORTABLE CHEST 1 VIEW COMPARISON:  06/08/2017 FINDINGS: 1534 hours. Persistent nodular density in the right upper lung. Left base atelectasis or scarring  noted. No pulmonary edema or substantial pleural effusion. Cardiopericardial silhouette is at upper limits of normal for size. The visualized bony structures of the thorax are intact. Telemetry leads overlie the chest. IMPRESSION: Persistent irregular nodular density in the right upper lobe. CT chest without contrast recommended to further evaluate. Left base atelectasis or scarring. Electronically Signed   By: Misty Stanley M.D.   On: 07/12/2017 15:48    Medical Decision Making   PIERA DOWNS is a 72 y.o. female who presents with: threatened L leg, B CVA, hypotensive with tachycardia (possible sepsis of unknown etiology), likely afib   Unfortunately, I suspect this L leg is already non-salvageable but there is no evidence of rigor mortis, so I agreed to try to salvage this L leg.  Given prior history of B CVA from a known embolic source, left leg findings may be due to embolic phenomena, possibly due to afib.  Patient has multiple significant co-morbidities including possible early sepsis of unknown etiology.    I discussed with the patient and her husband: left leg thrombectomy, possible bypass, possible fasciotomy.    Neither the patient nor the husband gave permission to proceed with amputation of the left leg. The risk, benefits, and alternative for proposed operations were discussed with the patient and her husband. The patient's husband is aware the risks include but are not limited to: bleeding, infection, myocardial infarction, stroke, limb loss, nerve damage, complications from fasciotomy, need for additional procedures in the future, wound complications, and inability to salvage the left leg given its advance state of ischemia.  The patient's husband has given consent to proceed.  I had a frank discussion with the patient's husband there was real chance of her spouse dying given the multiple active co-morbidities.  Thank you for allowing [place] to participate in this patient's  care.  Adele Barthel, MD, FACS [facility] Office: [phone] Pager: [phone]  07/12/2017, 4:13 PM

## 2017-07-12 NOTE — Anesthesia Procedure Notes (Signed)
Procedure Name: Intubation Date/Time: 07/12/2017 5:27 PM Performed by: Lance Coon, CRNA Pre-anesthesia Checklist: Patient identified, Emergency Drugs available, Suction available, Patient being monitored and Timeout performed Patient Re-evaluated:Patient Re-evaluated prior to induction Oxygen Delivery Method: Circle system utilized Preoxygenation: Pre-oxygenation with 100% oxygen Induction Type: IV induction, Rapid sequence and Cricoid Pressure applied Laryngoscope Size: Miller and 3 Grade View: Grade I Tube type: Oral Tube size: 7.0 mm Number of attempts: 1 Airway Equipment and Method: Stylet Placement Confirmation: ETT inserted through vocal cords under direct vision,  positive ETCO2 and breath sounds checked- equal and bilateral Secured at: 21 cm Tube secured with: Tape Dental Injury: Teeth and Oropharynx as per pre-operative assessment

## 2017-07-12 NOTE — ED Triage Notes (Signed)
Pt was recently discharged one week ago for left sided stroke and started having some left calf pain a couple of days and then left leg discoloration since last night.  Pt has a congested cough and faint radials.  Pt has a left leg dark purple discoloration from distal knee down and no pulses found by Dr. Sherry Ruffing via palpation or doppler.  Pt has pain 8/10 and is alert to answer questions and follow directions

## 2017-07-12 NOTE — Consult Note (Signed)
PULMONARY / CRITICAL CARE MEDICINE   Name: Shelley [name] MRN: [id number] DOB: 1946/06/29    ADMISSION DATE:  07/12/2017 CONSULTATION DATE:  07/12/17  REFERRING MD:  Bridgett Larsson  CHIEF COMPLAINT:  Left foot pain  HISTORY OF PRESENT ILLNESS:  Pt is encephelopathic; therefore, this HPI is obtained from chart review. Shelley [name] is a 72 y.o. female with PMH as outlined below including recent admission 06/08/17 for CVA. She presented to [facility] ED 07/12/17 with left foot pain, unclear when it began, but per husband, pt had been complaining of pain during the night for the past 2 nights.  1 day prior to admit, she began to have skin color changes to the left foot and calf along with paralysis and anesthesia.  Due to concerns for threatened left leg, she was taken to the OR in attempts to salvage the leg.  Pt nor husband did not agree to amputation; therefore, thrombectomy was planned.  She had thrombectomy of the left femoropopliteal artery, anterior tibial artery, tibioperoneal trunk, and peroneal artery.  She was also found to have dead muscle in all compartments.  Post OR, she was admitted to the ICU and PCCM was asked to help with med management.  Per family, vascular planning on amputation in AM 1/11.  At time of my exam patient seemed to not understand any of today's intraoperative findings or the plan for amputation in the AM. She c/o leg pain throughout the exam. She also c/o the light hurting her eyes. Denied SOB, Cough, CP.    PAST MEDICAL HISTORY :  She  has a past medical history of Alcohol abuse, Anxiety, CVA (cerebral infarction), Hypertension, Osteoporosis, and Seizures ([place]).  PAST SURGICAL HISTORY: She  has a past surgical history that includes Abdominal hysterectomy; Breast lumpectomy; Oophorectomy; Empyema drainage; Flexible sigmoidoscopy (03/14/99); Colonoscopy with propofol (06/04/2012); and polypectomy (06/04/2012).  Allergies  Allergen Reactions  . Acyclovir And Related     No  current facility-administered medications on file prior to encounter.    Current Outpatient Medications on File Prior to Encounter  Medication Sig  . amLODipine (NORVASC) 10 MG tablet Take 1 tablet (10 mg total) by mouth daily.  Marland Kitchen atorvastatin (LIPITOR) 20 MG tablet Take 1 tablet (20 mg total) by mouth daily.  . clopidogrel (PLAVIX) 75 MG tablet Take 1 tablet (75 mg total) by mouth daily.  Marland Kitchen levETIRAcetam (KEPPRA) 500 MG tablet Take 1 tablet (500 mg total) by mouth 2 (two) times daily.  Marland Kitchen lisinopril (PRINIVIL,ZESTRIL) 10 MG tablet Take 1 tablet (10 mg total) by mouth daily.  . magnesium oxide (MAG-OX) 400 MG tablet Take 1 tablet (400 mg total) by mouth 2 (two) times daily.  . metoprolol tartrate (LOPRESSOR) 25 MG tablet Take 1 tablet (25 mg total) by mouth 2 (two) times daily.  . nicotine (NICODERM CQ - DOSED IN MG/24 HOURS) 21 mg/24hr patch 21 mg patch daily times one week then 14 mg patch daily 3 weeks then 7 mg patch daily 3 weeks and stop   FAMILY HISTORY:  Her has no family status information on file.   SOCIAL HISTORY: She  reports that she has been smoking cigarettes.  She has been smoking about 0.50 packs per day. she has never used smokeless tobacco. She reports that she does not drink alcohol or use drugs.  REVIEW OF SYSTEMS:    Review of Systems  Constitutional: Negative.   HENT: Negative.   Eyes: Negative.   Respiratory: Negative.   Cardiovascular: Negative.  Gastrointestinal: Negative.   Genitourinary: Negative.   Musculoskeletal: Positive for joint pain.       Pain in Left leg  Skin: Negative.   Neurological: Negative.   Endo/Heme/Allergies: Negative.   Psychiatric/Behavioral: Negative.    SUBJECTIVE:  Patient reports "my leg hurts. My head might hurt too; I don't know."  VITAL SIGNS: BP (!) 84/72   Resp (!) 23   Ht 5\' 5"  (1.651 m)   Wt 69.1 kg (152 lb 5.4 oz)   SpO2 96%   BMI 25.35 kg/m   INTAKE / OUTPUT: I/O last 3 completed shifts: In: 250 [IV  Piggyback:250] Out: 100 [Urine:100]  PHYSICAL EXAMINATION: General: Elderly cachectic female, critically ill, lying in ICU bed on face mask Neuro: Awake, slightly confused. Is following commands and answering some questions HEENT: [place]/AT. PERRL, sclerae anicteric. Cardiovascular: IRIR with Rate 110, no M/R/G.  Lungs: Respirations even and unlabored.  CTA bilaterally, No W/R/R. Abdomen: BS x 4, soft, NT/ND.  Musculoskeletal: Left foot and leg cyanotic and cold, no edema. No pedal pulses palpated. Can palpate left femoral pulse.  Skin: Left foot and leg cyanotic and cold.  LABS:  BMET Recent Labs  Lab 07/12/17 1553  NA 137  K 3.8  CL 103  BUN 49*  CREATININE 1.40*  GLUCOSE 123*    Electrolytes No results for input(s): CALCIUM, MG, PHOS in the last 168 hours.  CBC Recent Labs  Lab 07/12/17 1545 07/12/17 1553  WBC 13.6*  --   HGB 16.1* 16.7*  HCT 48.4* 49.0*  PLT 176  --    Coag's No results for input(s): APTT, INR in the last 168 hours.  Sepsis Markers Recent Labs  Lab 07/12/17 1554  LATICACIDVEN 2.01*   ABG No results for input(s): PHART, PCO2ART, PO2ART in the last 168 hours.  Liver Enzymes No results for input(s): AST, ALT, ALKPHOS, BILITOT, ALBUMIN in the last 168 hours.  Cardiac Enzymes No results for input(s): TROPONINI, PROBNP in the last 168 hours.  Glucose No results for input(s): GLUCAP in the last 168 hours.  Imaging Dg Chest Portable 1 View  Result Date: 07/12/2017 CLINICAL DATA:  Coughing congestion. EXAM: PORTABLE CHEST 1 VIEW COMPARISON:  06/08/2017 FINDINGS: 1534 hours. Persistent nodular density in the right upper lung. Left base atelectasis or scarring noted. No pulmonary edema or substantial pleural effusion. Cardiopericardial silhouette is at upper limits of normal for size. The visualized bony structures of the thorax are intact. Telemetry leads overlie the chest. IMPRESSION: Persistent irregular nodular density in the right upper lobe.  CT chest without contrast recommended to further evaluate. Left base atelectasis or scarring. Electronically Signed   By: Misty Stanley M.D.   On: 07/12/2017 15:48   STUDIES:  CXR 1/10 > RUL nodular density.  CULTURES: None.  ANTIBIOTICS: None.  SIGNIFICANT EVENTS: 1/10 > admitted with left leg pain, likely ischemic from embolic phenomenon.  Taken to OR.  LINES/TUBES: L IJ CVL 1/10 >   DISCUSSION: 72 y.o. female admitted 1/10 with left leg pain.  Felt to have ischemia; therefore, taken to OR for attempts at thrombectomy.  In OR, found to have dead muscle in all LLE compartments. Per family, vascular planning on amputation in AM 1/11.  ASSESSMENT / PLAN:  PULMONARY 1. Acute Hypoxic Respiratory failure - Pox 91% on simple face mask; will try HFNC.  - lungs CTA b/l on exam and no accessory muscle use. Patient reports she does not use supplemental O2 at home.  - CXR from this evening  on my review shows an elevated left hemidiaphragm but no infiltrates or edema or effusions - will start IS and Flutter - has hx of dCHF; may be getting volume overloaded. Will decrease IVF's to KVO  2. RUL nodular density. - nonemergent CT scan for follow-up  3. Tobacco dependence. - Continue nicotine patch.  CARDIOVASCULAR 1. Left leg ischemia - concern for non-salvagable limb; thrombectomy performed 1/10.  Per family, vascular planning on amputation in AM 1/11. - lactate 2.0 this afternoon; repeat lactate now - pain control as needed  2. A.fib - ? New onset, no documented hx. - continue dilt gtt for rate control - replete electrolytes - cardiology is following - obtain TTE - was on heparin gtt; now on hold for surgery  3 Hx HTN; soft BP since post-op: - hold home BP meds; continue to monitor  RENAL 1. Hypokalemia; AKI; Hypocalcemia. - Replete K with 40MEQ IV; will check Mg and Phos - Creatinine 1.09 down from 1.4 on admission, up from 0.67 baseline - 1g Ca gluconate. - BMP in  AM.  GASTROINTESTINAL Nutrition. NPO for now.  HEMATOLOGIC Hemoconcentration. VTE Prophylaxis. Heparin gtt. CBC in AM.  INFECTIOUS No indication of infection. Monitor clinically.  ENDOCRINE No acute issues.  NEUROLOGIC 1. Hx recent CVA 2. EtOh abuse. - continue Thiamine / Folate. - monitor CIWA  Family updated: Husband, son, daughter updated at bedside.  Interdisciplinary Family Meeting v Palliative Care Meeting:  Due by: 07/19/17.  CC time: 45 min.  Vernie Murders, MD Pulmonary & Critical Care Medicine Pager: [phone]

## 2017-07-12 NOTE — ED Provider Notes (Signed)
[facility] EMERGENCY DEPARTMENT Provider Note   CSN: [id number] Arrival date & time: 07/12/17  1504     History   Chief Complaint Chief Complaint  Patient presents with  . Claudication  . Leg Pain    HPI DENAE ZULUETA is a 72 y.o. female.  HPI   72 year old female with hx of tobacco abuse, HTN, recent stroke currently on Plavix brought here via EMS for L leg discoloration.  Patient report for the past 3-4 days she has had persistent pain to her left lower extremity.  She described pain as a throbbing burning sensation with tingling numbness which has gotten progressively worse.  Nothing seems to make it better.  Last night and this morning her husband noticed that her left leg is discolored and EMS was contacted and patient brought here for further evaluation.  Currently she describes her pain as "very bad" with intermittent tingling sensation.  No report of fever, chills, chest pain, shortness of breath, back pain.  No specific treatment tried.  Patient has never had this problem before.  She has a wheelchair at home and use crutches on occasion.  She was diagnosed with a lacunar infarct last month and has been on Plavix.  She is a heavy smoker, a pack and half a day for the past 20 years but quit approximately 2 weeks ago.  She denies any recent injury.  Stroke is ischemic in nature.  She does report a nonproductive cough for the past several weeks since discontinue her tobacco use.      Past Medical History:  Diagnosis Date  . Alcohol abuse    drinks awhile   . Anxiety   . CVA (cerebral infarction)    pt denies  . Hypertension   . Osteoporosis   . Seizures [facility])     Patient Active Problem List   Diagnosis Date Noted  . Adjustment disorder with mixed anxiety and depressed mood   . 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 Benign essential HTN   . Diastolic dysfunction   . Hypokalemia   . Stroke (cerebrum) ([place]) 06/10/2017  . Acute  lacunar stroke ([place]) 06/08/2017  . Diplopia 06/08/2017  . Hypertension   . Anxiety   . Seizures ([place])   . Encounter for screening colonoscopy 05/14/2012    Past Surgical History:  Procedure Laterality Date  . ABDOMINAL HYSTERECTOMY    . BREAST LUMPECTOMY    . COLONOSCOPY WITH PROPOFOL  06/04/2012   Procedure: COLONOSCOPY WITH PROPOFOL;  Surgeon: Danie Binder, MD;  Location: [facility];  Service: Endoscopy;  Laterality: N/A;  entered cecum @ 0752; total cecal withdrawal time = 13 minutes  . EMPYEMA DRAINAGE    . FLEXIBLE SIGMOIDOSCOPY  03/14/99   internal hemorrhoids  . OOPHORECTOMY    . POLYPECTOMY  06/04/2012   Procedure: POLYPECTOMY;  Surgeon: Danie Binder, MD;  Location: [facility];  Service: Endoscopy;  Laterality: N/A;  descending colon polyps x2; rectal polyp x1    OB History    No data available       Home Medications    Prior to Admission medications   Medication Sig Start Date End Date Taking? Authorizing Provider  amLODipine (NORVASC) 10 MG tablet Take 1 tablet (10 mg total) by mouth daily. 06/29/17 06/29/18  Angiulli, Lavon Paganini, PA-C  atorvastatin (LIPITOR) 20 MG tablet Take 1 tablet (20 mg total) by mouth daily. 06/29/17 06/29/18  Angiulli, Lavon Paganini, PA-C  clopidogrel (PLAVIX) 75  MG tablet Take 1 tablet (75 mg total) by mouth daily. 06/29/17   Angiulli, Lavon Paganini, PA-C  levETIRAcetam (KEPPRA) 500 MG tablet Take 1 tablet (500 mg total) by mouth 2 (two) times daily. 06/29/17   Angiulli, Lavon Paganini, PA-C  lisinopril (PRINIVIL,ZESTRIL) 10 MG tablet Take 1 tablet (10 mg total) by mouth daily. 06/29/17   Angiulli, Lavon Paganini, PA-C  magnesium oxide (MAG-OX) 400 MG tablet Take 1 tablet (400 mg total) by mouth 2 (two) times daily. 06/12/17   Georgette Shell, MD  metoprolol tartrate (LOPRESSOR) 25 MG tablet Take 1 tablet (25 mg total) by mouth 2 (two) times daily. 06/29/17   Angiulli, Lavon Paganini, PA-C  nicotine (NICODERM CQ - DOSED IN MG/24 HOURS) 21 mg/24hr patch 21 mg patch daily  times one week then 14 mg patch daily 3 weeks then 7 mg patch daily 3 weeks and stop 06/29/17   Angiulli, Lavon Paganini, PA-C    Family History Family History  Problem Relation Age of Onset  . Colon cancer Neg Hx     Social History Social History   Tobacco Use  . Smoking status: Current Some Day Smoker    Packs/day: 0.50    Types: Cigarettes  . Smokeless tobacco: Never Used  Substance Use Topics  . Alcohol use: No    Comment: hx of ETOH abuse  . Drug use: No     Allergies   Acyclovir and related   Review of Systems Review of Systems  All other systems reviewed and are negative.    Physical Exam Updated Vital Signs BP (!) 84/72   Resp (!) 23   Ht 5\' 5"  (1.651 m)   Wt 69.1 kg (152 lb 5.4 oz)   SpO2 96%   BMI 25.35 kg/m   Physical Exam  Constitutional: She is oriented to person, place, and time. She appears well-developed and well-nourished. No distress.  Elderly female nontoxic in appearance  HENT:  Head: Atraumatic.  Eyes: Conjunctivae are normal.  Neck: Neck supple.  Cardiovascular:  Irregularly irregular heart rhythm without murmur rubs or gallops  Pulmonary/Chest:  Scattered rhonchi in the upper lung field, lower lung fields, clear  Abdominal: Soft. There is no tenderness.  Neurological: She is alert and oriented to person, place, and time.  Skin: No rash noted.  Left lower extremity: Significant discoloration of the lower extremity with appearance of mottled skin, purplish, cold to the touch, without palpable dorsalis or posterior tibialis pulses.  Tender to palpation.  Discoloration extending to the proximal tib-fib region.  Please refer to pictures below.  Psychiatric: She has a normal mood and affect.  Nursing note and vitals reviewed.        ED Treatments / Results  Labs (all labs ordered are listed, but only abnormal results are displayed) Labs Reviewed  CBC WITH DIFFERENTIAL/PLATELET - Abnormal; Notable for the following components:       Result Value   WBC 13.6 (*)    RBC 5.26 (*)    Hemoglobin 16.1 (*)    HCT 48.4 (*)    Neutro Abs 10.4 (*)    All other components within normal limits  I-STAT CHEM 8, ED - Abnormal; Notable for the following components:   BUN 49 (*)    Creatinine, Ser 1.40 (*)    Glucose, Bld 123 (*)    Calcium, Ion 1.05 (*)    TCO2 21 (*)    Hemoglobin 16.7 (*)    HCT 49.0 (*)    All other  components within normal limits  I-STAT CG4 LACTIC ACID, ED - Abnormal; Notable for the following components:   Lactic Acid, Venous 2.01 (*)    All other components within normal limits  BASIC METABOLIC PANEL  CBC  I-STAT TROPONIN, ED  TYPE AND SCREEN    EKG  EKG Interpretation None       Radiology Dg Chest Portable 1 View  Result Date: 07/12/2017 CLINICAL DATA:  Coughing congestion. EXAM: PORTABLE CHEST 1 VIEW COMPARISON:  06/08/2017 FINDINGS: 1534 hours. Persistent nodular density in the right upper lung. Left base atelectasis or scarring noted. No pulmonary edema or substantial pleural effusion. Cardiopericardial silhouette is at upper limits of normal for size. The visualized bony structures of the thorax are intact. Telemetry leads overlie the chest. IMPRESSION: Persistent irregular nodular density in the right upper lobe. CT chest without contrast recommended to further evaluate. Left base atelectasis or scarring. Electronically Signed   By: Misty Stanley M.D.   On: 07/12/2017 15:48    Procedures Procedures (including critical care time)  Medications Ordered in ED Medications  0.9 % irrigation (POUR BTL) (2,000 mLs Irrigation Given 07/12/17 1642)  heparin 6,000 Units in sodium chloride 0.9 % 500 mL irrigation ( Irrigation Given 07/12/17 1642)  metoprolol tartrate (LOPRESSOR) injection 2 mg (not administered)  metoprolol tartrate (LOPRESSOR) 5 MG/5ML injection (not administered)  diltiazem (CARDIZEM) 100 mg in dextrose 5 % 100 mL (1 mg/mL) infusion (not administered)  sodium chloride 0.9 % bolus  1,000 mL (1,000 mLs Intravenous Transfusing/Transfer 07/12/17 1632)     Initial Impression / Assessment and Plan / ED Course  I have reviewed the triage vital signs and the nursing notes.  Pertinent labs & imaging results that were available during my care of the patient were reviewed by me and considered in my medical decision making (see chart for details).     BP (!) 84/72   Resp (!) 23   Ht 5\' 5"  (1.651 m)   Wt 69.1 kg (152 lb 5.4 oz)   SpO2 96%   BMI 25.35 kg/m    Final Clinical Impressions(s) / ED Diagnoses   Final diagnoses:  Nontraumatic ischemic infarction of muscle of left lower leg    ED Discharge Orders    None     3:36 PM Patient here with acute onset of left leg discoloration concerning for acute thrombotic arterial occlusion.  Significant history of tobacco abuse, prior history of lacunar infarct last month.  Neurology was consulted and states that patient is safe to have heparin if needed.  Vascular surgeon, Dr. Vallarie Mare has been consulted by Dr. Sherry Ruffing and agrees to see patient probably.  Pain medication offered but patient declined at this time.  Patient made n.p.o.  IV fluid given as patient appears to be hypotensive with a blood pressure of 86/59, and tachycardic.  History of atrial fibrillation. Care discussed with Dr. Sherry Ruffing.   4:14 PM Vascular surgeon Dr. Bridgett Larsson has seen and evaluate pt. Anticipate admission for further management.    CRITICAL CARE Performed by: Domenic Moras Total critical care time: 30 minutes Critical care time was exclusive of separately billable procedures and treating other patients. Critical care was necessary to treat or prevent imminent or life-threatening deterioration. Critical care was time spent personally by me on the following activities: development of treatment plan with patient and/or surrogate as well as nursing, discussions with consultants, evaluation of patient's response to treatment, examination of patient, obtaining  history from patient or surrogate, ordering and performing treatments  and interventions, ordering and review of laboratory studies, ordering and review of radiographic studies, pulse oximetry and re-evaluation of patient's condition.    Domenic Moras, PA-C 07/12/17 1728    Tegeler, Gwenyth Allegra, MD 07/13/17 1438

## 2017-07-13 ENCOUNTER — Encounter (HOSPITAL_COMMUNITY)
Admission: EM | Disposition: A | Discharge status: Hospice | Payer: Self-pay | Source: Home / Self Care | Attending: Internal Medicine

## 2017-07-13 ENCOUNTER — Inpatient Hospital Stay (HOSPITAL_COMMUNITY): Payer: Medicare Other

## 2017-07-13 ENCOUNTER — Encounter (HOSPITAL_COMMUNITY): Payer: Self-pay | Admitting: Vascular Surgery

## 2017-07-13 ENCOUNTER — Inpatient Hospital Stay (HOSPITAL_COMMUNITY): Payer: Medicare Other | Admitting: Certified Registered Nurse Anesthetist

## 2017-07-13 DIAGNOSIS — I361 Nonrheumatic tricuspid (valve) insufficiency: Secondary | ICD-10-CM

## 2017-07-13 DIAGNOSIS — T82898A Other specified complication of vascular prosthetic devices, implants and grafts, initial encounter: Secondary | ICD-10-CM

## 2017-07-13 HISTORY — PX: THROMBECTOMY FEMORAL ARTERY: SHX6406

## 2017-07-13 HISTORY — PX: AMPUTATION: SHX166

## 2017-07-13 LAB — PROTIME-INR
INR: 1.18
PROTHROMBIN TIME: 14.9 s (ref 11.4–15.2)

## 2017-07-13 LAB — POCT I-STAT 7, (LYTES, BLD GAS, ICA,H+H)
Acid-base deficit: 7 mmol/L — ABNORMAL HIGH (ref 0.0–2.0)
Acid-base deficit: 7 mmol/L — ABNORMAL HIGH (ref 0.0–2.0)
Bicarbonate: 15.5 mmol/L — ABNORMAL LOW (ref 20.0–28.0)
Bicarbonate: 18.1 mmol/L — ABNORMAL LOW (ref 20.0–28.0)
CALCIUM ION: 1.17 mmol/L (ref 1.15–1.40)
Calcium, Ion: 1.13 mmol/L — ABNORMAL LOW (ref 1.15–1.40)
HEMATOCRIT: 41 % (ref 36.0–46.0)
HEMATOCRIT: 38 % (ref 36.0–46.0)
HEMOGLOBIN: 12.9 g/dL (ref 12.0–15.0)
Hemoglobin: 13.9 g/dL (ref 12.0–15.0)
O2 SAT: 95 %
O2 Saturation: 100 %
PCO2 ART: 30.5 mmHg — AB (ref 32.0–48.0)
PO2 ART: 184 mmHg — AB (ref 83.0–108.0)
POTASSIUM: 3.3 mmol/L — AB (ref 3.5–5.1)
POTASSIUM: 3.2 mmol/L — AB (ref 3.5–5.1)
Patient temperature: 35.6
SODIUM: 140 mmol/L (ref 135–145)
Sodium: 140 mmol/L (ref 135–145)
TCO2: 16 mmol/L — AB (ref 22–32)
TCO2: 19 mmol/L — AB (ref 22–32)
pCO2 arterial: 24.8 mmHg — ABNORMAL LOW (ref 32.0–48.0)
pH, Arterial: 7.404 (ref 7.350–7.450)
pH, Arterial: 7.374 (ref 7.350–7.450)
pO2, Arterial: 76 mmHg — ABNORMAL LOW (ref 83.0–108.0)

## 2017-07-13 LAB — ECHOCARDIOGRAM COMPLETE
AOASC: 36 cm
E decel time: 208 msec
FS: 29 % (ref 28–44)
Height: 65 in
IVS/LV PW RATIO, ED: 1.39
LA ID, A-P, ES: 37 mm
LA diam end sys: 37 mm
LA vol A4C: 73.1 ml
LA vol index: 40.1 mL/m2
LA vol: 71.8 mL
LADIAMINDEX: 2.06 cm/m2
LDCA: 2.54 cm2
LVOTD: 18 mm
MV Dec: 208
MV Peak grad: 4 mmHg
MV pk A vel: 41.2 m/s
MV pk E vel: 96.5 m/s
PW: 7.26 mm — AB (ref 0.6–1.1)
Reg peak vel: 262 cm/s
TAPSE: 19.1 mm
TRMAXVEL: 262 cm/s
Weight: 2437.41 oz

## 2017-07-13 LAB — COMPREHENSIVE METABOLIC PANEL
ALK PHOS: 100 U/L (ref 38–126)
ALT: 20 U/L (ref 14–54)
ANION GAP: 12 (ref 5–15)
AST: 43 U/L — ABNORMAL HIGH (ref 15–41)
Albumin: 3.4 g/dL — ABNORMAL LOW (ref 3.5–5.0)
BUN: 27 mg/dL — ABNORMAL HIGH (ref 6–20)
CO2: 17 mmol/L — AB (ref 22–32)
CREATININE: 0.78 mg/dL (ref 0.44–1.00)
Calcium: 8.7 mg/dL — ABNORMAL LOW (ref 8.9–10.3)
Chloride: 107 mmol/L (ref 101–111)
Glucose, Bld: 178 mg/dL — ABNORMAL HIGH (ref 65–99)
Potassium: 4 mmol/L (ref 3.5–5.1)
SODIUM: 136 mmol/L (ref 135–145)
TOTAL PROTEIN: 6 g/dL — AB (ref 6.5–8.1)
Total Bilirubin: 1.2 mg/dL (ref 0.3–1.2)

## 2017-07-13 LAB — MRSA PCR SCREENING: MRSA by PCR: NEGATIVE

## 2017-07-13 LAB — CK: CK TOTAL: 2279 U/L — AB (ref 38–234)

## 2017-07-13 LAB — PHOSPHORUS: PHOSPHORUS: 3.9 mg/dL (ref 2.5–4.6)

## 2017-07-13 LAB — LACTIC ACID, PLASMA
LACTIC ACID, VENOUS: 0.9 mmol/L (ref 0.5–1.9)
Lactic Acid, Venous: 1 mmol/L (ref 0.5–1.9)

## 2017-07-13 LAB — SURGICAL PCR SCREEN
MRSA, PCR: NEGATIVE
STAPHYLOCOCCUS AUREUS: NEGATIVE

## 2017-07-13 LAB — MAGNESIUM: Magnesium: 2.1 mg/dL (ref 1.7–2.4)

## 2017-07-13 SURGERY — THROMBECTOMY, ARTERY, FEMORAL
Anesthesia: General | Laterality: Left

## 2017-07-13 MED ORDER — FENTANYL CITRATE (PF) 100 MCG/2ML IJ SOLN
INTRAMUSCULAR | Status: DC | PRN
  Administered 2017-07-13 (×2): 50 ug via INTRAVENOUS

## 2017-07-13 MED ORDER — IOPAMIDOL (ISOVUE-370) INJECTION 76%
INTRAVENOUS | Status: AC
  Administered 2017-07-13: 100 mL
  Filled 2017-07-13: qty 100

## 2017-07-13 MED ORDER — OXYCODONE HCL 5 MG PO TABS: 5.0000 mg | ORAL_TABLET | Freq: Once | ORAL | Status: DC | PRN

## 2017-07-13 MED ORDER — CHLORHEXIDINE GLUCONATE 0.12 % MT SOLN
15.0000 mL | Freq: Two times a day (BID) | OROMUCOSAL | Status: DC
  Administered 2017-07-13 – 2017-07-31 (×29): 15 mL via OROMUCOSAL
  Filled 2017-07-13 (×20): qty 15

## 2017-07-13 MED ORDER — PHENYLEPHRINE HCL 10 MG/ML IJ SOLN
INTRAVENOUS | Status: DC | PRN
  Administered 2017-07-13: 50 ug/min via INTRAVENOUS

## 2017-07-13 MED ORDER — LIDOCAINE HCL (CARDIAC) 20 MG/ML IV SOLN
INTRAVENOUS | Status: DC | PRN
  Administered 2017-07-13: 30 mg via INTRAVENOUS

## 2017-07-13 MED ORDER — 0.9 % SODIUM CHLORIDE (POUR BTL) OPTIME
TOPICAL | Status: DC | PRN
  Administered 2017-07-13: 2000 mL

## 2017-07-13 MED ORDER — HYDROMORPHONE HCL 1 MG/ML IJ SOLN: 0.2500 mg | INTRAMUSCULAR | Status: DC | PRN

## 2017-07-13 MED ORDER — ALBUMIN HUMAN 5 % IV SOLN
INTRAVENOUS | Status: DC | PRN
  Administered 2017-07-13: 19:00:00 via INTRAVENOUS

## 2017-07-13 MED ORDER — ONDANSETRON HCL 4 MG/2ML IJ SOLN
INTRAMUSCULAR | Status: DC | PRN
  Administered 2017-07-13: 4 mg via INTRAVENOUS

## 2017-07-13 MED ORDER — LACTATED RINGERS IV SOLN
INTRAVENOUS | Status: DC | PRN
  Administered 2017-07-13 (×2): via INTRAVENOUS

## 2017-07-13 MED ORDER — FENTANYL CITRATE (PF) 100 MCG/2ML IJ SOLN: 25.0000 ug | INTRAMUSCULAR | Status: DC | PRN

## 2017-07-13 MED ORDER — HEPARIN (PORCINE) IN NACL 100-0.45 UNIT/ML-% IJ SOLN
950.0000 [IU]/h | INTRAMUSCULAR | Status: DC
  Filled 2017-07-13: qty 250

## 2017-07-13 MED ORDER — FENTANYL CITRATE (PF) 250 MCG/5ML IJ SOLN
INTRAMUSCULAR | Status: AC
  Filled 2017-07-13: qty 5

## 2017-07-13 MED ORDER — DILTIAZEM HCL 100 MG IV SOLR
INTRAVENOUS | Status: DC | PRN
  Administered 2017-07-13: 10 mg/h via INTRAVENOUS

## 2017-07-13 MED ORDER — DILTIAZEM HCL 100 MG IV SOLR
5.0000 mg/h | INTRAVENOUS | Status: DC
  Administered 2017-07-14: 10 mg/h via INTRAVENOUS
  Administered 2017-07-14 – 2017-07-15 (×2): 5 mg/h via INTRAVENOUS
  Administered 2017-07-15 – 2017-07-17 (×5): 10 mg/h via INTRAVENOUS
  Administered 2017-07-17 – 2017-07-18 (×3): 12.5 mg/h via INTRAVENOUS
  Filled 2017-07-13 (×13): qty 100

## 2017-07-13 MED ORDER — SODIUM CHLORIDE 0.9 % IV SOLN
INTRAVENOUS | Status: DC | PRN
  Administered 2017-07-13: 500 mL

## 2017-07-13 MED ORDER — SODIUM CHLORIDE 0.9 % IV SOLN
INTRAVENOUS | Status: DC
  Administered 2017-07-14: 11:00:00 via INTRAVENOUS
  Administered 2017-07-15: 50 mL/h via INTRAVENOUS
  Administered 2017-07-18: 17:00:00 via INTRAVENOUS
  Administered 2017-07-19: 1000 mL via INTRAVENOUS

## 2017-07-13 MED ORDER — CEFAZOLIN SODIUM-DEXTROSE 2-4 GM/100ML-% IV SOLN
INTRAVENOUS | Status: AC
  Filled 2017-07-13: qty 100

## 2017-07-13 MED ORDER — CEFAZOLIN SODIUM-DEXTROSE 2-4 GM/100ML-% IV SOLN
2.0000 g | INTRAVENOUS | Status: AC
  Administered 2017-07-13: 2 g via INTRAVENOUS

## 2017-07-13 MED ORDER — ROCURONIUM BROMIDE 100 MG/10ML IV SOLN
INTRAVENOUS | Status: DC | PRN
  Administered 2017-07-13: 50 mg via INTRAVENOUS

## 2017-07-13 MED ORDER — MAGNESIUM SULFATE 2 GM/50ML IV SOLN
2.0000 g | Freq: Once | INTRAVENOUS | Status: AC
  Administered 2017-07-13: 2 g via INTRAVENOUS
  Filled 2017-07-13: qty 50

## 2017-07-13 MED ORDER — PHENYLEPHRINE HCL 10 MG/ML IJ SOLN
INTRAMUSCULAR | Status: DC | PRN
  Administered 2017-07-13: 40 ug via INTRAVENOUS

## 2017-07-13 MED ORDER — OXYCODONE HCL 5 MG/5ML PO SOLN: 5.0000 mg | Freq: Once | ORAL | Status: DC | PRN

## 2017-07-13 MED ORDER — ORAL CARE MOUTH RINSE
15.0000 mL | Freq: Two times a day (BID) | OROMUCOSAL | Status: DC
  Administered 2017-07-13 – 2017-07-31 (×29): 15 mL via OROMUCOSAL

## 2017-07-13 MED ORDER — SODIUM CHLORIDE 0.9 % IV BOLUS (SEPSIS)
500.0000 mL | Freq: Once | INTRAVENOUS | Status: AC
  Administered 2017-07-13: 500 mL via INTRAVENOUS

## 2017-07-13 MED ORDER — HEPARIN SODIUM (PORCINE) 1000 UNIT/ML IJ SOLN
INTRAMUSCULAR | Status: DC | PRN
  Administered 2017-07-13: 8000 [IU] via INTRAVENOUS

## 2017-07-13 MED ORDER — SUGAMMADEX SODIUM 200 MG/2ML IV SOLN
INTRAVENOUS | Status: DC | PRN
  Administered 2017-07-13: 138.2 mg via INTRAVENOUS

## 2017-07-13 MED ORDER — PROPOFOL 10 MG/ML IV BOLUS
INTRAVENOUS | Status: DC | PRN
  Administered 2017-07-13: 50 mg via INTRAVENOUS

## 2017-07-13 MED ORDER — LACTATED RINGERS IV SOLN
INTRAVENOUS | Status: DC
  Administered 2017-07-13: 18:00:00 via INTRAVENOUS

## 2017-07-13 SURGICAL SUPPLY — 92 items
ADH SKN CLS APL DERMABOND .7 (GAUZE/BANDAGES/DRESSINGS) ×1
ADH SKN CLS LQ APL DERMABOND (GAUZE/BANDAGES/DRESSINGS) ×1
BANDAGE ACE 4X5 VEL STRL LF (GAUZE/BANDAGES/DRESSINGS) ×3 IMPLANT
BANDAGE ACE 6X5 VEL STRL LF (GAUZE/BANDAGES/DRESSINGS) ×3 IMPLANT
BANDAGE ESMARK 6X9 LF (GAUZE/BANDAGES/DRESSINGS) IMPLANT
BLADE SAW GIGLI 510 (BLADE) ×2 IMPLANT
BLADE SAW GIGLI 510MM (BLADE) ×1
BNDG CMPR 9X6 STRL LF SNTH (GAUZE/BANDAGES/DRESSINGS)
BNDG COHESIVE 6X5 TAN STRL LF (GAUZE/BANDAGES/DRESSINGS) ×5 IMPLANT
BNDG ESMARK 6X9 LF (GAUZE/BANDAGES/DRESSINGS)
BNDG GAUZE ELAST 4 BULKY (GAUZE/BANDAGES/DRESSINGS) ×5 IMPLANT
CANISTER SUCT 3000ML PPV (MISCELLANEOUS) ×3 IMPLANT
CANNULA VESSEL 3MM 2 BLNT TIP (CANNULA) IMPLANT
CATH EMB 3FR 40CM (CATHETERS) ×2 IMPLANT
CATH EMB 3FR 80CM (CATHETERS) IMPLANT
CATH EMB 4FR 80CM (CATHETERS) ×2 IMPLANT
CATH EMB 5FR 80CM (CATHETERS) IMPLANT
CLIP TI WIDE RED SMALL 6 (CLIP) ×2 IMPLANT
CLIP VESOCCLUDE MED 24/CT (CLIP) ×3 IMPLANT
CLIP VESOCCLUDE MED 6/CT (CLIP) ×3 IMPLANT
CLIP VESOCCLUDE SM WIDE 24/CT (CLIP) ×3 IMPLANT
COVER SURGICAL LIGHT HANDLE (MISCELLANEOUS) ×3 IMPLANT
CUFF TOURNIQUET SINGLE 24IN (TOURNIQUET CUFF) IMPLANT
CUFF TOURNIQUET SINGLE 34IN LL (TOURNIQUET CUFF) IMPLANT
CUFF TOURNIQUET SINGLE 44IN (TOURNIQUET CUFF) IMPLANT
DERMABOND ADHESIVE PROPEN (GAUZE/BANDAGES/DRESSINGS) ×2
DERMABOND ADVANCED (GAUZE/BANDAGES/DRESSINGS) ×2
DERMABOND ADVANCED .7 DNX12 (GAUZE/BANDAGES/DRESSINGS) ×1 IMPLANT
DERMABOND ADVANCED .7 DNX6 (GAUZE/BANDAGES/DRESSINGS) IMPLANT
DRAIN CHANNEL 15F RND FF W/TCR (WOUND CARE) IMPLANT
DRAIN CHANNEL 19F RND (DRAIN) IMPLANT
DRAPE HALF SHEET 40X57 (DRAPES) ×3 IMPLANT
DRAPE ORTHO SPLIT 77X108 STRL (DRAPES) ×6
DRAPE SURG ORHT 6 SPLT 77X108 (DRAPES) ×2 IMPLANT
DRAPE X-RAY CASS 24X20 (DRAPES) IMPLANT
DRSG ADAPTIC 3X8 NADH LF (GAUZE/BANDAGES/DRESSINGS) ×3 IMPLANT
ELECT CAUTERY BLADE 6.4 (BLADE) ×3 IMPLANT
ELECT REM PT RETURN 9FT ADLT (ELECTROSURGICAL) ×3
ELECTRODE REM PT RTRN 9FT ADLT (ELECTROSURGICAL) ×1 IMPLANT
EVACUATOR SILICONE 100CC (DRAIN) IMPLANT
GAUZE SPONGE 4X4 12PLY STRL (GAUZE/BANDAGES/DRESSINGS) ×3 IMPLANT
GLOVE BIO SURGEON STRL SZ7.5 (GLOVE) ×3 IMPLANT
GLOVE BIOGEL PI IND STRL 6 (GLOVE) IMPLANT
GLOVE BIOGEL PI IND STRL 7.0 (GLOVE) IMPLANT
GLOVE BIOGEL PI IND STRL 7.5 (GLOVE) IMPLANT
GLOVE BIOGEL PI INDICATOR 6 (GLOVE) ×2
GLOVE BIOGEL PI INDICATOR 7.0 (GLOVE) ×2
GLOVE BIOGEL PI INDICATOR 7.5 (GLOVE) ×2
GLOVE ECLIPSE 7.0 STRL STRAW (GLOVE) ×2 IMPLANT
GLOVE SURG SS PI 7.5 STRL IVOR (GLOVE) ×2 IMPLANT
GOWN STRL REUS W/ TWL LRG LVL3 (GOWN DISPOSABLE) ×2 IMPLANT
GOWN STRL REUS W/ TWL XL LVL3 (GOWN DISPOSABLE) ×1 IMPLANT
GOWN STRL REUS W/TWL LRG LVL3 (GOWN DISPOSABLE) ×6
GOWN STRL REUS W/TWL XL LVL3 (GOWN DISPOSABLE) ×3
INSERT FOGARTY SM (MISCELLANEOUS) IMPLANT
KIT BASIN OR (CUSTOM PROCEDURE TRAY) ×3 IMPLANT
KIT ROOM TURNOVER OR (KITS) ×3 IMPLANT
MARKER GRAFT CORONARY BYPASS (MISCELLANEOUS) IMPLANT
NS IRRIG 1000ML POUR BTL (IV SOLUTION) ×6 IMPLANT
PACK GENERAL/GYN (CUSTOM PROCEDURE TRAY) ×1 IMPLANT
PACK PERIPHERAL VASCULAR (CUSTOM PROCEDURE TRAY) ×3 IMPLANT
PAD ARMBOARD 7.5X6 YLW CONV (MISCELLANEOUS) ×6 IMPLANT
SET COLLECT BLD 21X3/4 12 (NEEDLE) IMPLANT
STAPLER VISISTAT (STAPLE) ×2 IMPLANT
STAPLER VISISTAT 35W (STAPLE) ×3 IMPLANT
STOCKINETTE IMPERVIOUS LG (DRAPES) ×3 IMPLANT
STOPCOCK 4 WAY LG BORE MALE ST (IV SETS) IMPLANT
SUT ETHILON 3 0 PS 1 (SUTURE) IMPLANT
SUT GORETEX 6.0 TT13 (SUTURE) IMPLANT
SUT GORETEX 6.0 TT9 (SUTURE) IMPLANT
SUT MNCRL AB 4-0 PS2 18 (SUTURE) ×6 IMPLANT
SUT PROLENE 5 0 C 1 24 (SUTURE) ×5 IMPLANT
SUT PROLENE 6 0 BV (SUTURE) ×3 IMPLANT
SUT PROLENE 7 0 BV 1 (SUTURE) IMPLANT
SUT SILK 0 TIES 10X30 (SUTURE) ×3 IMPLANT
SUT SILK 2 0 (SUTURE)
SUT SILK 2 0 SH (SUTURE) ×1 IMPLANT
SUT SILK 2-0 18XBRD TIE 12 (SUTURE) ×1 IMPLANT
SUT SILK 3 0 (SUTURE)
SUT SILK 3-0 18XBRD TIE 12 (SUTURE) IMPLANT
SUT VIC AB 2-0 CT1 18 (SUTURE) ×8 IMPLANT
SUT VIC AB 2-0 CT1 27 (SUTURE) ×3
SUT VIC AB 2-0 CT1 TAPERPNT 27 (SUTURE) ×2 IMPLANT
SUT VIC AB 3-0 SH 27 (SUTURE) ×3
SUT VIC AB 3-0 SH 27X BRD (SUTURE) ×2 IMPLANT
SYRINGE 3CC LL L/F (MISCELLANEOUS) ×4 IMPLANT
TAPE UMBILICAL COTTON 1/8X30 (MISCELLANEOUS) IMPLANT
TOWEL GREEN STERILE (TOWEL DISPOSABLE) ×6 IMPLANT
TRAY FOLEY W/METER SILVER 16FR (SET/KITS/TRAYS/PACK) IMPLANT
TUBING EXTENTION W/L.L. (IV SETS) IMPLANT
UNDERPAD 30X30 (UNDERPADS AND DIAPERS) ×3 IMPLANT
WATER STERILE IRR 1000ML POUR (IV SOLUTION) ×3 IMPLANT

## 2017-07-13 NOTE — Progress Notes (Addendum)
ANTICOAGULATION CONSULT NOTE  Pharmacy Consult:  Heparin Indication:  New-onset Afib  Allergies  Allergen Reactions  . Acyclovir And Related     Patient Measurements: Height: 5\' 5"  (165.1 cm) Weight: 152 lb 5.4 oz (69.1 kg) IBW/kg (Calculated) : 57 Heparin Dosing Weight: 69 kg  Vital Signs: Temp: 97.8 F (36.6 C) (01/11 1202) Temp Source: Oral (01/11 1202) BP: 99/77 (01/11 1200) Pulse Rate: 76 (01/11 1200)  Labs: Recent Labs    07/12/17 1545 07/12/17 1553 07/12/17 1950 07/13/17 0434  HGB 16.1* 16.7* 13.4  --   HCT 48.4* 49.0* 40.6  --   PLT 176  --  139*  --   LABPROT  --   --   --  14.9  INR  --   --   --  1.18  CREATININE  --  1.40* 1.09* 0.78  CKTOTAL  --   --   --  2,279*    Estimated Creatinine Clearance: 62 mL/min (by C-G formula based on SCr of 0.78 mg/dL).   Medical History: Past Medical History:  Diagnosis Date  . Alcohol abuse    drinks awhile   . Anxiety   . CVA (cerebral infarction)    pt denies  . Hypertension   . Osteoporosis   . Seizures ([place])    Assessment: 32 yoF presented with leg pain/discoloration and claudication, now s/p thrombectomy on 07/12/17. Pharmacy consulted to initiate IV heparin for new-onset afib. Heparin level scheduled for 0800 this AM not drawn. Plan for possible AKA today, heparin to stop at 1400 for CTA abd/pelvis.  Goal of Therapy:  Heparin level 0.3-0.7 units/ml Monitor platelets by anticoagulation protocol: Yes   Plan:  F/u after CTA for surgery plans, when to restart heparin  Mahmoud Blazejewski N. Gerarda Fraction, PharmD PGY1 Pharmacy Resident Pager: [phone]  07/13/2017, 1:06 PM

## 2017-07-13 NOTE — Anesthesia Preprocedure Evaluation (Signed)
Anesthesia Evaluation  Patient identified by MRN, date of birth, ID band Patient awake    Reviewed: Allergy & Precautions, H&P , NPO status , Patient's Chart, lab work & pertinent test results, reviewed documented beta blocker date and time   Airway Mallampati: II  TM Distance: >3 FB Neck ROM: Full    Dental no notable dental hx. (+) Partial Lower, Partial Upper, Dental Advisory Given   Pulmonary Current Smoker,    Pulmonary exam normal breath sounds clear to auscultation       Cardiovascular hypertension, Pt. on medications and Pt. on home beta blockers + dysrhythmias Atrial Fibrillation  Rhythm:Irregular Rate:Normal     Neuro/Psych Seizures -,  Anxiety CVA    GI/Hepatic negative GI ROS, Neg liver ROS,   Endo/Other  negative endocrine ROS  Renal/GU negative Renal ROS  negative genitourinary   Musculoskeletal   Abdominal   Peds  Hematology negative hematology ROS (+)   Anesthesia Other Findings   Reproductive/Obstetrics negative OB ROS                             Anesthesia Physical Anesthesia Plan  ASA: III  Anesthesia Plan: General   Post-op Pain Management:    Induction: Intravenous  PONV Risk Score and Plan: 3 and Ondansetron and Dexamethasone  Airway Management Planned: Oral ETT  Additional Equipment:   Intra-op Plan:   Post-operative Plan: Extubation in OR  Informed Consent: I have reviewed the patients History and Physical, chart, labs and discussed the procedure including the risks, benefits and alternatives for the proposed anesthesia with the patient or authorized representative who has indicated his/her understanding and acceptance.   Dental advisory given  Plan Discussed with: CRNA  Anesthesia Plan Comments:         Anesthesia Quick Evaluation

## 2017-07-13 NOTE — Progress Notes (Signed)
ANTICOAGULATION CONSULT NOTE - Initial Consult  Pharmacy Consult for Heparin Indication: atrial fibrillation  Allergies  Allergen Reactions  . Acyclovir And Related     Patient Measurements: Height: 5\' 5"  (165.1 cm) Weight: 152 lb 5.4 oz (69.1 kg) IBW/kg (Calculated) : 57 Heparin Dosing Weight: 69 kg  Vital Signs: Temp: 97.4 F (36.3 C) (01/11 2100) Temp Source: Oral (01/11 2100) BP: 85/58 (01/11 2100) Pulse Rate: 87 (01/11 2100)  Labs: Recent Labs    07/12/17 1545 07/12/17 1553 07/12/17 1711 07/12/17 1824 07/12/17 1950 07/13/17 0434  HGB 16.1* 16.7* 13.9 12.9 13.4  --   HCT 48.4* 49.0* 41.0 38.0 40.6  --   PLT 176  --   --   --  139*  --   LABPROT  --   --   --   --   --  14.9  INR  --   --   --   --   --  1.18  CREATININE  --  1.40*  --   --  1.09* 0.78  CKTOTAL  --   --   --   --   --  2,279*    Estimated Creatinine Clearance: 62 mL/min (by C-G formula based on SCr of 0.78 mg/dL).   Medical History: Past Medical History:  Diagnosis Date  . Alcohol abuse    drinks awhile   . Anxiety   . CVA (cerebral infarction)    pt denies  . Hypertension   . Osteoporosis   . 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 Heparin was off for surgery.  Pharmacy consulted to resume IV heparin at 0500 AM on 07/14/17.   EBL from procedure was 250 cc per surgery note.  H/H on admission was within normal limits.  Platelets were slightly low at 139.  Goal of Therapy:  Heparin level 0.3-0.7 units/ml Monitor platelets by anticoagulation protocol: Yes   Plan:  Resume heparin at 950 units/hr at 0500 on 07/14/17.  Heparin level 8 hours after restart. Daily heparin level and CBC while on therapy.  Monitor for bleeding.   Sloan Leiter, PharmD, BCPS, BCCCP Clinical Pharmacist Clinical phone 07/13/2017 until 11PM [phone] After hours,  please call [phone] 07/13/2017,9:09 PM

## 2017-07-13 NOTE — Progress Notes (Signed)
  Progress Note    I have interviewed and examined patient with notable non-viable left leg below the knee. She also has no femoral pulse on the left and this is corroborated by CT scan. Will proceed with left common femoral thrombectomy and aka on left.   Stewart Pimenta C. Donzetta Matters, MD [facility] [place] Office: [phone] Pager: [phone]  07/13/2017 5:45 PM

## 2017-07-13 NOTE — Transfer of Care (Signed)
Immediate Anesthesia Transfer of Care Note  Patient: Shelley [name]  Procedure(s) Performed: THROMBECTOMY FEMORAL ARTERY (Left ) AMPUTATION ABOVE KNEE (Left )  Patient Location: PACU  Anesthesia Type:General  Level of Consciousness: awake and alert   Airway & Oxygen Therapy: Patient Spontanous Breathing and Patient connected to nasal cannula oxygen  Post-op Assessment: Report given to RN and Post -op Vital signs reviewed and stable  Post vital signs: Reviewed and stable  Last Vitals:  Vitals:   07/13/17 1530 07/13/17 1600  BP:  111/74  Pulse:    Resp:  15  Temp: (!) 36.4 C   SpO2:      Last Pain:  Vitals:   07/13/17 1530  TempSrc: Oral  PainSc:          Complications: No apparent anesthesia complications

## 2017-07-13 NOTE — Progress Notes (Signed)
   Daily Progress Note   Assessment/Planning:   POD #1 s/p TE L leg for L threatened leg likely due to thromboembolism, new onset Afib   Intraop, L lower leg was essentially dead with no viable muscles in compartments.  Vein and arteries were thrombosed.  CPK reflects this dead muscle.  The lack of perfusion is likely beneficial in this case, as the overwhelming release of cytokines from the mass of dead tissue has been blunted.  Pt more amendable to L AKA this AM.  Will schedule this later today.  Pt appears to have converted back to NSR with Diltiazem drip.  Continued heparin drip until NOON.  Will resume after L AKA  Echo ordered already.  May need rest of thromboembolism work-up after L AKA: CTA chest/abd/pelvis to look for aortic thrombus  I ordered L arterial duplex to evaluate the proximal femoral vasculature.  This patient was taken urgently to the OR due to the threaten leg status, so imaging of the proximal arterial flow was not done.  As long as the PFA is patent, she should be able to heal her AKA.   Subjective  - 1 Day Post-Op   More oriented this AM, understands intraop findings and agrees to proceed with L AKA   Objective   Vitals:   07/13/17 0630 07/13/17 0645 07/13/17 0700 07/13/17 0755  BP:   110/78   Pulse: 84 83 85   Resp: 18 15 15    Temp:    97.6 F (36.4 C)  TempSrc:    Oral  SpO2: 93% 95% 95%   Weight:      Height:         Intake/Output Summary (Last 24 hours) at 07/13/2017 0904 Last data filed at 07/13/2017 0700 Gross per 24 hour  Intake 1460 ml  Output 1185 ml  Net 275 ml    PULM  BLL rales  CV  RRR, Dilt gtt  GI  soft, NTND  VASC L foot demarcating at proximal calf  NEURO No sensation or motor in L foot    Laboratory   CBC CBC Latest Ref Rng & Units 07/12/2017 07/12/2017 07/12/2017  WBC 4.0 - 10.5 K/uL 9.9 - 13.6(H)  Hemoglobin 12.0 - 15.0 g/dL 13.4 16.7(H) 16.1(H)  Hematocrit 36.0 - 46.0 % 40.6 49.0(H) 48.4(H)  Platelets 150 - 400  K/uL 139(L) - 176    BMET    Component Value Date/Time   NA 136 07/13/2017 0434   K 4.0 07/13/2017 0434   CL 107 07/13/2017 0434   CO2 17 (L) 07/13/2017 0434   GLUCOSE 178 (H) 07/13/2017 0434   BUN 27 (H) 07/13/2017 0434   CREATININE 0.78 07/13/2017 0434   CALCIUM 8.7 (L) 07/13/2017 0434   GFRNONAA >60 07/13/2017 0434   GFRAA >60 07/13/2017 0434     Adele Barthel, MD, FACS [facility] [place] Office: [phone] Pager: [phone]  07/13/2017, 9:04 AM

## 2017-07-13 NOTE — Progress Notes (Addendum)
   Daily Progress Note  Markedly abnormal arterial duplex noted.  Will need to obtain stat Abd/pelvis CTA w/ BRo to determine next step.  - Appears embolism was more proximal than expected.  Unfortunately, pt wasn't stable enough to get imaging studies preop (threatened limb, unstable afib) - Pt had palpable left femoral pulse at end of case, so hopefully these findings are not NEW embolus - Will need proximal studies to determine what in addition to L AKA will be needed.   - Pt appears to need L femoral TE also in additional L AKA.  Adele Barthel, MD, FACS [facility] [place] Office: [phone] Pager: [phone]  07/13/2017, 11:26 AM

## 2017-07-13 NOTE — Anesthesia Procedure Notes (Signed)
Procedure Name: Intubation Date/Time: 07/13/2017 6:42 PM Performed by: Eligha Bridegroom, CRNA Pre-anesthesia Checklist: Patient identified, Emergency Drugs available, Suction available, Patient being monitored and Timeout performed Patient Re-evaluated:Patient Re-evaluated prior to induction Oxygen Delivery Method: Circle system utilized Preoxygenation: Pre-oxygenation with 100% oxygen Induction Type: IV induction Ventilation: Mask ventilation without difficulty Laryngoscope Size: Mac and 4 Grade View: Grade II Tube type: Oral Tube size: 7.0 mm Number of attempts: 1 Airway Equipment and Method: Stylet Secured at: 20 cm Tube secured with: Tape Dental Injury: Teeth and Oropharynx as per pre-operative assessment

## 2017-07-13 NOTE — Progress Notes (Signed)
Progress Note  Patient Name: Shelley [name] Date of Encounter: 07/13/2017  Primary Cardiologist:   Cristopher Peru, MD   Subjective   She denies pain or SOB.   Inpatient Medications    Scheduled Meds: . atorvastatin  20 mg Oral Daily  . chlorhexidine  15 mL Mouth Rinse BID  . clopidogrel  75 mg Oral Daily  . folic acid  1 mg Intravenous Daily  . mouth rinse  15 mL Mouth Rinse q12n4p  . metoprolol tartrate  2 mg Intravenous Once  . nicotine  21 mg Transdermal Daily  . thiamine  100 mg Intravenous Daily   Continuous Infusions: . sodium chloride 10 mL/hr at 07/13/17 0800  . diltiazem (CARDIZEM) infusion 10 mg/hr (07/13/17 0800)  . heparin 950 Units/hr (07/13/17 0800)  . levETIRAcetam Stopped (07/13/17 0015)   PRN Meds: alum & mag hydroxide-simeth, hydrALAZINE, labetalol, LORazepam **OR** LORazepam, metoprolol tartrate, morphine injection, ondansetron, oxyCODONE-acetaminophen, polyethylene glycol   Vital Signs    Vitals:   07/13/17 0700 07/13/17 0755 07/13/17 0800 07/13/17 0900  BP: 110/78  110/79   Pulse: 85  79 77  Resp: 15  16 16   Temp:  97.6 F (36.4 C)    TempSrc:  Oral    SpO2: 95%  94% 92%  Weight:      Height:        Intake/Output Summary (Last 24 hours) at 07/13/2017 1000 Last data filed at 07/13/2017 0900 Gross per 24 hour  Intake 1509 ml  Output 1385 ml  Net 124 ml   Filed Weights   07/12/17 1643  Weight: 152 lb 5.4 oz (69.1 kg)    Telemetry    Atrial fib with rate controlled - Personally Reviewed  ECG    NA - Personally Reviewed  Physical Exam   GEN: No acute distress.   Neck: No  JVD Cardiac: Irregular RR,  murmurs, rubs, or gallops.  Respiratory: Clear to auscultation bilaterally. GI: Soft, nontender, non-distended  MS: No  edema; No deformity.  Left lower leg ischemic changes Neuro:  Nonfocal  Psych: Normal affect   Labs    Chemistry Recent Labs  Lab 07/12/17 1553 07/12/17 1950 07/13/17 0434  NA 137 138 136  K 3.8 3.1*  4.0  CL 103 107 107  CO2  --  21* 17*  GLUCOSE 123* 129* 178*  BUN 49* 36* 27*  CREATININE 1.40* 1.09* 0.78  CALCIUM  --  8.1* 8.7*  PROT  --   --  6.0*  ALBUMIN  --   --  3.4*  AST  --   --  43*  ALT  --   --  20  ALKPHOS  --   --  100  BILITOT  --   --  1.2  GFRNONAA  --  49* >60  GFRAA  --  57* >60  ANIONGAP  --  10 12     Hematology Recent Labs  Lab 07/12/17 1545 07/12/17 1553 07/12/17 1950  WBC 13.6*  --  9.9  RBC 5.26*  --  4.41  HGB 16.1* 16.7* 13.4  HCT 48.4* 49.0* 40.6  MCV 92.0  --  92.1  MCH 30.6  --  30.4  MCHC 33.3  --  33.0  RDW 14.2  --  14.1  PLT 176  --  139*    Cardiac EnzymesNo results for input(s): TROPONINI in the last 168 hours.  Recent Labs  Lab 07/12/17 1552  TROPIPOC 0.03     BNPNo results for  input(s): BNP, PROBNP in the last 168 hours.   DDimer No results for input(s): DDIMER in the last 168 hours.   Radiology    Dg Chest Port 1 View  Result Date: 07/12/2017 CLINICAL DATA:  CVL PLACEMENT-LEFT IJ EXAM: PORTABLE CHEST 1 VIEW COMPARISON:  Chest x-ray from earlier same day. FINDINGS: Left IJ central line appears adequately positioned with tip at the level of the mid/upper SVC. Heart size and mediastinal contours are stable. Nodular density was described in the right upper lung on earlier chest x-ray, not as well seen on today's study which may be related to oblique patient positioning. No pneumothorax seen. IMPRESSION: Left IJ central line in place with tip at the level of the mid/upper SVC. No pneumothorax. Electronically Signed   By: Franki Cabot M.D.   On: 07/12/2017 19:23   Dg Chest Portable 1 View  Result Date: 07/12/2017 CLINICAL DATA:  Coughing congestion. EXAM: PORTABLE CHEST 1 VIEW COMPARISON:  06/08/2017 FINDINGS: 1534 hours. Persistent nodular density in the right upper lung. Left base atelectasis or scarring noted. No pulmonary edema or substantial pleural effusion. Cardiopericardial silhouette is at upper limits of normal for  size. The visualized bony structures of the thorax are intact. Telemetry leads overlie the chest. IMPRESSION: Persistent irregular nodular density in the right upper lobe. CT chest without contrast recommended to further evaluate. Left base atelectasis or scarring. Electronically Signed   By: Misty Stanley M.D.   On: 07/12/2017 15:48    Cardiac Studies   ECHO :  Pending  Patient Profile     72 y.o. 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 Bridgett Larsson.   Assessment & Plan     ATRIAL FIB WITH RVR:  Rate controlled on IV Cardizem.  Continue through surgery today.  Possibly change to PO in the AM.  She is no heparin and can be changed to [place] prior to discharge.     For questions or updates, please contact [place] Please consult [email or web address] for contact info under Cardiology/STEMI.   Signed, Minus Breeding, MD  07/13/2017, 10:00 AM

## 2017-07-13 NOTE — Progress Notes (Signed)
  Echocardiogram 2D Echocardiogram has been performed.  Sloan Takagi G Hayli Milligan 07/13/2017, 2:08 PM

## 2017-07-13 NOTE — Progress Notes (Signed)
LLE arterial duplex prelim: Left Proximal CFA is patent. Left Distal CFA and SFA in entirety is occluded. Left Proximal to mid PFA is patent with dampened flow, but with partial thrombus, and appears to be occluded distally.  Landry Mellow, RDMS, RVT

## 2017-07-13 NOTE — Anesthesia Postprocedure Evaluation (Signed)
Anesthesia Post Note  Patient: Shelley [name]  Procedure(s) Performed: THROMBECTOMY FEMORAL ARTERY (Left ) AMPUTATION ABOVE KNEE (Left )     Patient location during evaluation: PACU Anesthesia Type: General Level of consciousness: awake and alert Pain management: pain level controlled Vital Signs Assessment: post-procedure vital signs reviewed and stable Respiratory status: spontaneous breathing, nonlabored ventilation and respiratory function stable Cardiovascular status: blood pressure returned to baseline and stable Postop Assessment: no apparent nausea or vomiting Anesthetic complications: no    Last Vitals:  Vitals:   07/13/17 2020 07/13/17 2035  BP: 106/65 93/66  Pulse: 81 89  Resp: 19 14  Temp:  (!) 36.4 C  SpO2: 97% 96%    Last Pain:  Vitals:   07/13/17 1530  TempSrc: Oral  PainSc:                  Caylie Sandquist,W. EDMOND

## 2017-07-13 NOTE — Op Note (Signed)
Patient name: ABBEE [name] MRN: [id number] DOB: April 28, 1946 Sex: female  07/13/2017 Pre-operative Diagnosis: necrotic left lower extremity Post-operative diagnosis:  Same Surgeon:  Erlene Quan C. Donzetta Matters, MD Assistant: Gerri Lins, PA Procedure Performed: 1.  Left lower extremity embolectomy 2.  Left above knee amputation  Indications:  72 year old female underwent left femoral embolectomy day prior to this procedure but unfortunately had nonviable tissue from the knee down. She has CT scan that demonstrated an occluded distal external iliac artery with reconstitution of the distal profunda but no other reconstitute artery. She was therefore indicated for left lower extremity embolectomy as well as above-the-knee amputation.  Findings: There is acute thrombus in the common femoral artery and significant thrombus extending down the superficial femoral front of her moral arteries. Following embolectomy we had brisk inflow as well as adequate backbleeding from the profunda. We did not have any backbleeding from the superficial femoral artery. At amputation there was necrotic muscle extending a few centimeters above the knee and so the amputation was taken mid thigh where all muscles apparently viable although the superficial veins were clotted throughout. The superficial femoral artery had strong antegrade bleeding at the level of the amputation and a completion all tissue appeared viable.   Procedure:  The patient was identified in the holding area and taken to the operating room where she is placed supine on the operative table and general endotracheal anesthesia was induced. She is sterilely prepped and draped the left lower extremity given antibiotics and timeout called. On this we marked out the common femoral artery by palpation although there was no palpable pulse. We also marked out above-the-knee incision for amputation. We then made a large dural incision overlying the common femoral artery  dissected down and placed vessel loops around the distal external iliac artery as well as the superficial femoral front force arteries. Patient was given 8000 units of heparin. Following this we opened become from artery large to deliver at the bifurcation. We counter acute thrombus. The first pass a 4 Fogarty Devoria Albe could distally which was to the extent of the Fogarty catheter and returned into the superficial femoral artery on multiple passes. We were satisfied that we had no residual thrombus but did not have adequate back leave this vessel was flushed with saline. We then passed a 3 Fogarty down the front of Morse artery return thrombus and had adequate backbleeding. We passed one more time until no thrombus was returned. Internal attention proximally were redressed 4 Fogarty and had brisk inflow. We then passed again and were satisfied that all residual thrombus was gone. All vessels were clamped we irrigated the lumen with heparinized saline and repaired the arteriotomy 5-0 Prolene suture. Hemostasis obtained in the wound and closed in layers with Vicryl and 4-0 Monocryl Dermabond level of the skin. Internal attention distally where a fishmouth type incision was made above the knee. We initially encountered nonviable muscle although all tissue was bleeding and subfascial veins were thrombosed. We then had to move up approximately 10 more centimeters to the mid thigh where a new fishmouth incision was made and carried down through the tissue level the bone with electrocautery. We then elevated the periosteum of the bone divide the femur with a Gigli saw. Posterior flap was created with amputation knife and the blood vessels were clamped. We then suture ligated the vessels pulled the nerve on tension ligated this with Vicryl tie and trimmed the nerve. The bone was smoothed with rasp. Hemostasis obtained where was  irrigated. Fascia was reapproximated with 2-0 Vicryl suture and skin with staples. Patient is in Y  from anesthesia having the procedure without any calm occasion. All counts were correct at completion.   EBL: 250cc  Brandon C. Donzetta Matters, MD [facility] Office: [phone] Pager: [phone]

## 2017-07-13 NOTE — Progress Notes (Signed)
PULMONARY / CRITICAL CARE MEDICINE   Name: Shelley [name] MRN: [id number] DOB: Jan 12, 1946    ADMISSION DATE:  07/12/2017 CONSULTATION DATE:  07/12/2017  REFERRING MD:  Dr Bridgett Larsson  CHIEF COMPLAINT:  Left foot pain  SUBJECTIVE:  No acute complaints, denies CP or SOB.  Reports pain in her left leg.  VITAL SIGNS: BP 102/72   Pulse 72   Temp 97.6 F (36.4 C) (Oral)   Resp 15   Ht 5\' 5"  (1.651 m)   Wt 152 lb 5.4 oz (69.1 kg)   SpO2 98%   BMI 25.35 kg/m   HEMODYNAMICS:    VENTILATOR SETTINGS:    INTAKE / OUTPUT: I/O last 3 completed shifts: In: 9678 [I.V.:1210; IV Piggyback:250] Out: 9381 [Urine:1135; Blood:50]  PHYSICAL EXAMINATION: General:  No acute distress Neuro:  A&O to person and place.  Appears somewhat delirious HEENT:  NCAT Cardiovascular:  Irregular rate and rhythm Lungs:  CTAB, no distress Abdomen:  Soft, non tender Musculoskeletal:  Ischemic changes to left lower leg  LABS:  BMET Recent Labs  Lab 07/12/17 1553 07/12/17 1950 07/13/17 0434  NA 137 138 136  K 3.8 3.1* 4.0  CL 103 107 107  CO2  --  21* 17*  BUN 49* 36* 27*  CREATININE 1.40* 1.09* 0.78  GLUCOSE 123* 129* 178*    Electrolytes Recent Labs  Lab 07/12/17 1950 07/12/17 2230 07/13/17 0434  CALCIUM 8.1*  --  8.7*  MG  --  1.6* 2.1  PHOS  --  4.0 3.9    CBC Recent Labs  Lab 07/12/17 1545 07/12/17 1553 07/12/17 1950  WBC 13.6*  --  9.9  HGB 16.1* 16.7* 13.4  HCT 48.4* 49.0* 40.6  PLT 176  --  139*    Coag's Recent Labs  Lab 07/13/17 0434  INR 1.18    Sepsis Markers Recent Labs  Lab 07/12/17 1554 07/12/17 2328 07/13/17 0434  LATICACIDVEN 2.01* 0.9 1.0    ABG No results for input(s): PHART, PCO2ART, PO2ART in the last 168 hours.  Liver Enzymes Recent Labs  Lab 07/13/17 0434  AST 43*  ALT 20  ALKPHOS 100  BILITOT 1.2  ALBUMIN 3.4*    Cardiac Enzymes No results for input(s): TROPONINI, PROBNP in the last 168 hours.  Glucose Recent Labs  Lab  07/12/17 2054  GLUCAP 121*    Imaging Dg Chest Port 1 View  Result Date: 07/12/2017 CLINICAL DATA:  CVL PLACEMENT-LEFT IJ EXAM: PORTABLE CHEST 1 VIEW COMPARISON:  Chest x-ray from earlier same day. FINDINGS: Left IJ central line appears adequately positioned with tip at the level of the mid/upper SVC. Heart size and mediastinal contours are stable. Nodular density was described in the right upper lung on earlier chest x-ray, not as well seen on today's study which may be related to oblique patient positioning. No pneumothorax seen. IMPRESSION: Left IJ central line in place with tip at the level of the mid/upper SVC. No pneumothorax. Electronically Signed   By: Franki Cabot M.D.   On: 07/12/2017 19:23   Dg Chest Portable 1 View  Result Date: 07/12/2017 CLINICAL DATA:  Coughing congestion. EXAM: PORTABLE CHEST 1 VIEW COMPARISON:  06/08/2017 FINDINGS: 1534 hours. Persistent nodular density in the right upper lung. Left base atelectasis or scarring noted. No pulmonary edema or substantial pleural effusion. Cardiopericardial silhouette is at upper limits of normal for size. The visualized bony structures of the thorax are intact. Telemetry leads overlie the chest. IMPRESSION: Persistent irregular nodular density in the right  upper lobe. CT chest without contrast recommended to further evaluate. Left base atelectasis or scarring. Electronically Signed   By: Misty Stanley M.D.   On: 07/12/2017 15:48     STUDIES:  CXR 1/10 > RUL nodular density.  CULTURES: None.  ANTIBIOTICS: None.  SIGNIFICANT EVENTS: 1/10 > admitted with left leg pain, likely ischemic from embolic phenomenon.  Taken to OR.  LINES/TUBES: L IJ CVL 1/10 >   DISCUSSION: 72 y.o. female admitted 1/10 with left leg pain.  Felt to have ischemia; therefore, taken to OR for attempts at thrombectomy.  In OR, found to have dead muscle in all LLE compartments. Per family, vascular planning on amputation in AM 1/11.  ASSESSMENT  / PLAN:  PULMONARY Acute Hypoxic Respiratory failure Pulse ox yesterday was 91% on face mask and she was transitioned to HFNC.  Sats maintaining >94% this morning  RUL nodular density. Nonemergent CT scan for follow-up  Tobacco dependence Continue nicotine patch.  CARDIOVASCULAR Left leg ischemia s/p  thrombectomy 1/10 VVS with plans for AKA this afternoon Pain control as needed  Atrial fibrillation with RVR Continue diltiazem gtt for rate control through surgery prior to switching to PO Cardiology is following TTE pending Heparin gtt due to needing surgery.  Likely can transition to [place] at DC  Hx HTN; soft BP since post-op: Hold home BP meds; continue to monitor  RENAL Hypokalemia AKI Potassium repleted Creatinine improved to 0.78 Trend BMET, UOP  GASTROINTESTINAL Nutrition. NPO for now.  HEMATOLOGIC Hemoconcentration. VTE Prophylaxis. Thrombocytopenia with baseline 180s Heparin gtt. CBC in AM.  INFECTIOUS No indication of infection. Monitor clinically.  ENDOCRINE No acute issues.  NEUROLOGIC Hx recent CVA EtOh abuse. Continue Thiamine / Folate. Monitor CIWA  Family updated: No family at bedside.  Husband called and discussed over the phone plan for AKA.  Interdisciplinary Family Meeting v Palliative Care Meeting:  Due by: 07/19/17.    Pulmonary and [place] Pager: [phone]  07/13/2017, 11:59 AM

## 2017-07-14 ENCOUNTER — Encounter (HOSPITAL_COMMUNITY): Payer: Self-pay | Admitting: Vascular Surgery

## 2017-07-14 DIAGNOSIS — R579 Shock, unspecified: Secondary | ICD-10-CM

## 2017-07-14 DIAGNOSIS — I749 Embolism and thrombosis of unspecified artery: Secondary | ICD-10-CM

## 2017-07-14 DIAGNOSIS — R0902 Hypoxemia: Secondary | ICD-10-CM

## 2017-07-14 DIAGNOSIS — I481 Persistent atrial fibrillation: Secondary | ICD-10-CM

## 2017-07-14 LAB — CBC
HCT: 37.1 % (ref 36.0–46.0)
HEMATOCRIT: 26.4 % — AB (ref 36.0–46.0)
HEMOGLOBIN: 8.7 g/dL — AB (ref 12.0–15.0)
Hemoglobin: 12.3 g/dL (ref 12.0–15.0)
MCH: 30.9 pg (ref 26.0–34.0)
MCH: 30.2 pg (ref 26.0–34.0)
MCHC: 33 g/dL (ref 30.0–36.0)
MCHC: 33.2 g/dL (ref 30.0–36.0)
MCV: 93.6 fL (ref 78.0–100.0)
MCV: 91.2 fL (ref 78.0–100.0)
PLATELETS: 132 10*3/uL — AB (ref 150–400)
Platelets: 119 10*3/uL — ABNORMAL LOW (ref 150–400)
RBC: 2.82 MIL/uL — ABNORMAL LOW (ref 3.87–5.11)
RBC: 4.07 MIL/uL (ref 3.87–5.11)
RDW: 14.6 % (ref 11.5–15.5)
RDW: 14.1 % (ref 11.5–15.5)
WBC: 12.4 10*3/uL — AB (ref 4.0–10.5)
WBC: 14.3 10*3/uL — ABNORMAL HIGH (ref 4.0–10.5)

## 2017-07-14 LAB — BASIC METABOLIC PANEL
ANION GAP: 5 (ref 5–15)
Anion gap: 8 (ref 5–15)
BUN: 20 mg/dL (ref 6–20)
BUN: 17 mg/dL (ref 6–20)
CALCIUM: 7.7 mg/dL — AB (ref 8.9–10.3)
CHLORIDE: 113 mmol/L — AB (ref 101–111)
CO2: 20 mmol/L — AB (ref 22–32)
CO2: 18 mmol/L — AB (ref 22–32)
CREATININE: 0.65 mg/dL (ref 0.44–1.00)
Calcium: 8.1 mg/dL — ABNORMAL LOW (ref 8.9–10.3)
Chloride: 111 mmol/L (ref 101–111)
Creatinine, Ser: 0.6 mg/dL (ref 0.44–1.00)
GFR calc Af Amer: >60 mL/min (ref >60)
GFR calc Af Amer: >60 mL/min (ref >60)
GFR calc non Af Amer: >60 mL/min (ref >60)
GFR calc non Af Amer: >60 mL/min (ref >60)
GLUCOSE: 143 mg/dL — AB (ref 65–99)
Glucose, Bld: 116 mg/dL — ABNORMAL HIGH (ref 65–99)
POTASSIUM: 3.7 mmol/L (ref 3.5–5.1)
Potassium: 3.8 mmol/L (ref 3.5–5.1)
SODIUM: 138 mmol/L (ref 135–145)
Sodium: 137 mmol/L (ref 135–145)

## 2017-07-14 LAB — HEPARIN LEVEL (UNFRACTIONATED): HEPARIN UNFRACTIONATED: 0.84 [IU]/mL — AB (ref 0.30–0.70)

## 2017-07-14 LAB — PHOSPHORUS: Phosphorus: 2.1 mg/dL — ABNORMAL LOW (ref 2.5–4.6)

## 2017-07-14 LAB — GLUCOSE, CAPILLARY
Glucose-Capillary: 114 mg/dL — ABNORMAL HIGH (ref 65–99)
Glucose-Capillary: 134 mg/dL — ABNORMAL HIGH (ref 65–99)

## 2017-07-14 LAB — PREPARE RBC (CROSSMATCH)

## 2017-07-14 LAB — MAGNESIUM: Magnesium: 1.5 mg/dL — ABNORMAL LOW (ref 1.7–2.4)

## 2017-07-14 LAB — LACTIC ACID, PLASMA: LACTIC ACID, VENOUS: 1.1 mmol/L (ref 0.5–1.9)

## 2017-07-14 MED ORDER — POTASSIUM PHOSPHATES 15 MMOLE/5ML IV SOLN
30.0000 mmol | Freq: Once | INTRAVENOUS | Status: AC
  Administered 2017-07-14: 30 mmol via INTRAVENOUS
  Filled 2017-07-14: qty 10

## 2017-07-14 MED ORDER — SODIUM CHLORIDE 0.9 % IV SOLN
Freq: Once | INTRAVENOUS | Status: AC
  Administered 2017-07-14: 02:00:00 via INTRAVENOUS

## 2017-07-14 MED ORDER — NOREPINEPHRINE BITARTRATE 1 MG/ML IV SOLN
0.0000 ug/min | INTRAVENOUS | Status: DC
  Administered 2017-07-14: 5 ug/min via INTRAVENOUS
  Administered 2017-07-14: 6 ug/min via INTRAVENOUS
  Filled 2017-07-14 (×2): qty 4

## 2017-07-14 MED ORDER — HEPARIN (PORCINE) IN NACL 100-0.45 UNIT/ML-% IJ SOLN
1000.0000 [IU]/h | INTRAMUSCULAR | Status: AC
  Administered 2017-07-14: 1100 [IU]/h via INTRAVENOUS
  Administered 2017-07-15 – 2017-07-16 (×2): 900 [IU]/h via INTRAVENOUS
  Administered 2017-07-17 – 2017-07-18 (×2): 1000 [IU]/h via INTRAVENOUS
  Filled 2017-07-14 (×6): qty 250

## 2017-07-14 MED ORDER — MAGNESIUM SULFATE 2 GM/50ML IV SOLN
2.0000 g | Freq: Once | INTRAVENOUS | Status: AC
  Administered 2017-07-14: 2 g via INTRAVENOUS
  Filled 2017-07-14: qty 50

## 2017-07-14 MED ORDER — LACTATED RINGERS IV BOLUS (SEPSIS)
1000.0000 mL | Freq: Once | INTRAVENOUS | Status: AC
  Administered 2017-07-14: 1000 mL via INTRAVENOUS

## 2017-07-14 NOTE — Progress Notes (Signed)
Progress Note  Patient Name: Shelley [name] Date of Encounter: 07/14/2017  Primary Cardiologist: Cristopher Peru, MD   Subjective   No CP or dyspnea  Inpatient Medications    Scheduled Meds: . atorvastatin  20 mg Oral Daily  . chlorhexidine  15 mL Mouth Rinse BID  . clopidogrel  75 mg Oral Daily  . folic acid  1 mg Intravenous Daily  . mouth rinse  15 mL Mouth Rinse q12n4p  . nicotine  21 mg Transdermal Daily  . thiamine  100 mg Intravenous Daily   Continuous Infusions: . sodium chloride 10 mL/hr at 07/13/17 0800  . sodium chloride 50 mL/hr at 07/14/17 1034  . diltiazem (CARDIZEM) infusion Stopped (07/13/17 2255)  . heparin 1,100 Units/hr (07/14/17 1200)  . lactated ringers 10 mL/hr at 07/13/17 2100  . levETIRAcetam Stopped (07/14/17 0931)  . norepinephrine (LEVOPHED) Adult infusion 2 mcg/min (07/14/17 1300)  . potassium PHOSPHATE IVPB (mmol) 30 mmol (07/14/17 1319)   PRN Meds: alum & mag hydroxide-simeth, hydrALAZINE, labetalol, LORazepam **OR** LORazepam, metoprolol tartrate, morphine injection, ondansetron, oxyCODONE-acetaminophen, polyethylene glycol   Vital Signs    Vitals:   07/14/17 1100 07/14/17 1200 07/14/17 1219 07/14/17 1300  BP: 102/74 102/73  102/75  Pulse: (!) 110 (!) 111  94  Resp: (!) 23 (!) 24  19  Temp:   97.8 F (36.6 C)   TempSrc:   Oral   SpO2: 97% 99%  91%  Weight:      Height:        Intake/Output Summary (Last 24 hours) at 07/14/2017 1357 Last data filed at 07/14/2017 1319 Gross per 24 hour  Intake 5527.66 ml  Output 510 ml  Net 5017.66 ml   Filed Weights   07/12/17 1643 07/13/17 1741 07/14/17 0545  Weight: 152 lb 5.4 oz (69.1 kg) 152 lb 5.4 oz (69.1 kg) 148 lb 5.9 oz (67.3 kg)    Telemetry    Atrial fibrillation with elevated rate at times- Personally Reviewed  Physical Exam   GEN: No acute distress.   Neck: No JVD Cardiac: irregular and tachycardic Respiratory: Clear to auscultation bilaterally. GI: Soft, nontender,  non-distended  MS: s/p amputation Neuro:  Nonfocal  Psych: Normal affect   Labs    Chemistry Recent Labs  Lab 07/13/17 0434 07/13/17 2326 07/14/17 0748  NA 136 138 137  K 4.0 3.8 3.7  CL 107 113* 111  CO2 17* 20* 18*  GLUCOSE 178* 116* 143*  BUN 27* 20 17  CREATININE 0.78 0.65 0.60  CALCIUM 8.7* 7.7* 8.1*  PROT 6.0*  --   --   ALBUMIN 3.4*  --   --   AST 43*  --   --   ALT 20  --   --   ALKPHOS 100  --   --   BILITOT 1.2  --   --   GFRNONAA >60 >60 >60  GFRAA >60 >60 >60  ANIONGAP 12 5 8      Hematology Recent Labs  Lab 07/12/17 1950 07/13/17 2326 07/14/17 0546  WBC 9.9 12.4* 14.3*  RBC 4.41 2.82* 4.07  HGB 13.4 8.7* 12.3  HCT 40.6 26.4* 37.1  MCV 92.1 93.6 91.2  MCH 30.4 30.9 30.2  MCHC 33.0 33.0 33.2  RDW 14.1 14.6 14.1  PLT 139* 119* 132*    Recent Labs  Lab 07/12/17 1552  TROPIPOC 0.03    Radiology    Ct Angio Ao+bifem W & Or Wo Contrast  Result Date: 07/14/2017 CLINICAL DATA:  Peripheral arterial disease.  Ischemia. EXAM: CT ANGIOGRAPHY CHEST, ABDOMEN AND PELVIS, AORTOGRAM AND RUNOFF TECHNIQUE: Multidetector CT imaging through the chest, abdomen and pelvis was performed using the standard protocol during bolus administration of intravenous contrast. Multiplanar reconstructed images and MIPs were obtained and reviewed to evaluate the vascular anatomy. CONTRAST:  181mL ISOVUE-370 IOPAMIDOL (ISOVUE-370) INJECTION 76% COMPARISON:  10/16/2001 FINDINGS: CTA CHEST FINDINGS Cardiovascular: Atherosclerotic calcifications of the aortic arch and great vessels are noted. There is no evidence of aortic dissection or obvious intramural hematoma. Maximal diameter of the ascending aorta is 3.6 cm. There is no obvious acute pulmonary thromboembolism. Great vessels are grossly patent. Vertebral arteries both opacify normally within the confines of the examination. Left vertebral is dominant. Mediastinum/Nodes: There is no abnormal mediastinal adenopathy by measurement  criteria. Physiologic pericardial fluid is noted. Thyroid is within normal limits. Esophagus is unremarkable. Lungs/Pleura: No pneumothorax. No pleural effusion. 1.3 cm irregular mass with spiculation in the right upper lobe on image 44. Similar 1.1 cm spiculated mass in the right upper lobe on image 41. There is dense consolidation and collapse nearly involving the entire left lower lobe. Small peripheral airways appear occluded. No obvious central obstructing airway mass. There is severe narrowing involving the territory segment to the superior segment of the left lower lobe. There is some focal atelectasis versus consolidation at the base of the lingula. Musculoskeletal: No vertebral compression deformity. Sternum is intact. No acute rib deformity. Review of the MIP images confirms the above findings. CTA ABDOMEN AND PELVIS FINDINGS VASCULAR Aorta: Aorta is nonaneurysmal and patent. Scattered atherosclerotic calcifications in the infrarenal aorta. 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 Branch vessels are patent. 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 SMA: Patent.  Branch vessels grossly patent. Renals: Atherosclerotic calcification is present at the origin of the bilateral single renal arteries. No significant narrowing. IMA: Diminutive and patent. Inflow: Atherosclerotic calcifications are present in the bilateral common iliac arteries without aneurysmal dilatation or significant narrowing. External iliac arteries are also patent with minimal atherosclerotic change. Right internal iliac artery is patent. The main left internal iliac artery is patent. The posterior division is patent. There is abrupt occlusion of the anterior division of the left internal iliac artery there is low-density filling the vessel at this occlusion worrisome for acute thrombosis. Lower extremities: Right common femoral, profunda femoral, and superficial femoral arteries are patent. Right  popliteal artery is patent. Tibial vessels are patent for 3 vessel runoff. The left common femoral artery is occluded abruptly. There is very poor filling of muscular branches of the profunda femoral artery. The main profunda femoral artery and superficial femoral artery are also completely occluded. The left popliteal artery is occluded. There is no arterial opacification below the mid thigh. Review of the MIP images confirms the above findings. NON-VASCULAR Hepatobiliary: Diffuse hepatic steatosis. Gallbladder is slightly dilated. There is wall thickening at the fundus of unknown significance. Common bile duct is dilated. Pancreas: Atrophic. Spleen: Unremarkable. Adrenals/Urinary Tract: Adrenal glands are within normal limits. Chronic changes of the kidneys are present. Bladder is decompressed by a Foley catheter. Stomach/Bowel: Stomach is decompressed. Large duodenal diverticulum involves the descending portion. No evidence of small-bowel obstruction. No obvious mass in the colon. Sigmoid diverticulosis. Lymphatic: No abnormal retroperitoneal adenopathy. Reproductive: Uterus is absent.  Adnexa are unremarkable. Other: There is no free fluid. Musculoskeletal: There is gas within the deep soft tissues of the left lower extremity. There is gas within the popliteal fossa and  medial to the proximal left tibia. There is also gas lateral to the mid left leg musculature as well as adjacent to the tibial vasculature of the mid leg. These findings suggest necrotizing fasciitis and infection with gas-forming organism. The musculature within the deep tissues of the left leg are engorged. There is blurring of the fat planes. These findings suggest swelling and possibly ischemic change and impending compartment syndrome. Review of the MIP images confirms the above findings. IMPRESSION: Vascular: There is abrupt occlusion of the left common femoral artery. The profunda femoral, superficial femoral, popliteal artery, and tibial  arteries are also non-opacified in the left lower extremity. Diffuse vessel occlusion and thrombosis cannot be excluded. Given gas in the soft tissues of the left lower extremity, ischemia with necrotizing fasciitis and possible dead tissue are of clinical concern. Preliminary report was given during the time of the examination. I am interpreting the examination the following morning after left leg amputation. There is occlusion of the anterior division of the left internal iliac artery. No evidence of right lower extremity arterial occlusion. 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 No acute vascular abnormality within the thorax. Specifically, no evidence of intraluminal thrombus within the aorta. No evidence of dissection or intramural hematoma. Nonvascular: There are 2 spiculated masses in the right upper lobe as described. Malignancy is not excluded. PET-CT is recommended. Consolidation and collapse in the left lower lobe as described. Swelling and fullness of the musculature in the left leg worrisome for ischemia an impending compartment syndrome. Electronically Signed   By: Marybelle Killings M.D.   On: 07/14/2017 08:41   Dg Chest Port 1 View  Result Date: 07/12/2017 CLINICAL DATA:  CVL PLACEMENT-LEFT IJ EXAM: PORTABLE CHEST 1 VIEW COMPARISON:  Chest x-ray from earlier same day. FINDINGS: Left IJ central line appears adequately positioned with tip at the level of the mid/upper SVC. Heart size and mediastinal contours are stable. Nodular density was described in the right upper lung on earlier chest x-ray, not as well seen on today's study which may be related to oblique patient positioning. No pneumothorax seen. IMPRESSION: Left IJ central line in place with tip at the level of the mid/upper SVC. No pneumothorax. Electronically Signed   By: Franki Cabot M.D.   On: 07/12/2017 19:23   Dg Chest Portable 1 View  Result Date: 07/12/2017 CLINICAL DATA:  Coughing congestion. EXAM: PORTABLE CHEST 1 VIEW COMPARISON:   06/08/2017 FINDINGS: 1534 hours. Persistent nodular density in the right upper lung. Left base atelectasis or scarring noted. No pulmonary edema or substantial pleural effusion. Cardiopericardial silhouette is at upper limits of normal for size. The visualized bony structures of the thorax are intact. Telemetry leads overlie the chest. IMPRESSION: Persistent irregular nodular density in the right upper lobe. CT chest without contrast recommended to further evaluate. Left base atelectasis or scarring. Electronically Signed   By: Misty Stanley M.D.   On: 07/12/2017 15:48   Ct Angio Chest Aorta W/cm &/or Wo/cm  Result Date: 07/14/2017 CLINICAL DATA:  Peripheral arterial disease.  Ischemia. EXAM: CT ANGIOGRAPHY CHEST, ABDOMEN AND PELVIS, AORTOGRAM AND RUNOFF TECHNIQUE: Multidetector CT imaging through the chest, abdomen and pelvis was performed using the standard protocol during bolus administration of intravenous contrast. Multiplanar reconstructed images and MIPs were obtained and reviewed to evaluate the vascular anatomy. CONTRAST:  176mL ISOVUE-370 IOPAMIDOL (ISOVUE-370) INJECTION 76% COMPARISON:  10/16/2001 FINDINGS: CTA CHEST FINDINGS Cardiovascular: Atherosclerotic calcifications of the aortic arch and great vessels are noted. There is no evidence of  aortic dissection or obvious intramural hematoma. Maximal diameter of the ascending aorta is 3.6 cm. There is no obvious acute pulmonary thromboembolism. Great vessels are grossly patent. Vertebral arteries both opacify normally within the confines of the examination. Left vertebral is dominant. Mediastinum/Nodes: There is no abnormal mediastinal adenopathy by measurement criteria. Physiologic pericardial fluid is noted. Thyroid is within normal limits. Esophagus is unremarkable. Lungs/Pleura: No pneumothorax. No pleural effusion. 1.3 cm irregular mass with spiculation in the right upper lobe on image 44. Similar 1.1 cm spiculated mass in the right upper lobe on  image 41. There is dense consolidation and collapse nearly involving the entire left lower lobe. Small peripheral airways appear occluded. No obvious central obstructing airway mass. There is severe narrowing involving the territory segment to the superior segment of the left lower lobe. There is some focal atelectasis versus consolidation at the base of the lingula. Musculoskeletal: No vertebral compression deformity. Sternum is intact. No acute rib deformity. Review of the MIP images confirms the above findings. CTA ABDOMEN AND PELVIS FINDINGS VASCULAR Aorta: Aorta is nonaneurysmal and patent. Scattered atherosclerotic calcifications in the infrarenal aorta. 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 Branch vessels are patent. 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 SMA: Patent.  Branch vessels grossly patent. Renals: Atherosclerotic calcification is present at the origin of the bilateral single renal arteries. No significant narrowing. IMA: Diminutive and patent. Inflow: Atherosclerotic calcifications are present in the bilateral common iliac arteries without aneurysmal dilatation or significant narrowing. External iliac arteries are also patent with minimal atherosclerotic change. Right internal iliac artery is patent. The main left internal iliac artery is patent. The posterior division is patent. There is abrupt occlusion of the anterior division of the left internal iliac artery there is low-density filling the vessel at this occlusion worrisome for acute thrombosis. Lower extremities: Right common femoral, profunda femoral, and superficial femoral arteries are patent. Right popliteal artery is patent. Tibial vessels are patent for 3 vessel runoff. The left common femoral artery is occluded abruptly. There is very poor filling of muscular branches of the profunda femoral artery. The main profunda femoral artery and superficial femoral artery are also completely  occluded. The left popliteal artery is occluded. There is no arterial opacification below the mid thigh. Review of the MIP images confirms the above findings. NON-VASCULAR Hepatobiliary: Diffuse hepatic steatosis. Gallbladder is slightly dilated. There is wall thickening at the fundus of unknown significance. Common bile duct is dilated. Pancreas: Atrophic. Spleen: Unremarkable. Adrenals/Urinary Tract: Adrenal glands are within normal limits. Chronic changes of the kidneys are present. Bladder is decompressed by a Foley catheter. Stomach/Bowel: Stomach is decompressed. Large duodenal diverticulum involves the descending portion. No evidence of small-bowel obstruction. No obvious mass in the colon. Sigmoid diverticulosis. Lymphatic: No abnormal retroperitoneal adenopathy. Reproductive: Uterus is absent.  Adnexa are unremarkable. Other: There is no free fluid. Musculoskeletal: There is gas within the deep soft tissues of the left lower extremity. There is gas within the popliteal fossa and medial to the proximal left tibia. There is also gas lateral to the mid left leg musculature as well as adjacent to the tibial vasculature of the mid leg. These findings suggest necrotizing fasciitis and infection with gas-forming organism. The musculature within the deep tissues of the left leg are engorged. There is blurring of the fat planes. These findings suggest swelling and possibly ischemic change and impending compartment syndrome. Review of the MIP images confirms the above findings. IMPRESSION: Vascular: There is  abrupt occlusion of the left common femoral artery. The profunda femoral, superficial femoral, popliteal artery, and tibial arteries are also non-opacified in the left lower extremity. Diffuse vessel occlusion and thrombosis cannot be excluded. Given gas in the soft tissues of the left lower extremity, ischemia with necrotizing fasciitis and possible dead tissue are of clinical concern. Preliminary report was  given during the time of the examination. I am interpreting the examination the following morning after left leg amputation. There is occlusion of the anterior division of the left internal iliac artery. No evidence of right lower extremity arterial occlusion. 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 No acute vascular abnormality within the thorax. Specifically, no evidence of intraluminal thrombus within the aorta. No evidence of dissection or intramural hematoma. Nonvascular: There are 2 spiculated masses in the right upper lobe as described. Malignancy is not excluded. PET-CT is recommended. Consolidation and collapse in the left lower lobe as described. Swelling and fullness of the musculature in the left leg worrisome for ischemia an impending compartment syndrome. Electronically Signed   By: Marybelle Killings M.D.   On: 07/14/2017 08:41    Patient Profile     72 y.o. female with recent CVA admitted with embolic event to left lower extremity requiring amputation.  Noted to be in atrial fibrillation.  Cardiology asked to evaluate.  Echo shows normal LV function, trace aortic insufficiency, mildly dilated aortic root, mild to moderate tricuspid regurgitation.  Assessment & Plan    1 Persistent atrial fibrillation-patient remains in atrial fibrillation.  She is somewhat hypotensive.  Wean pressors as tolerated.  Continue Cardizem for rate control.  If blood pressure becomes  an issue then could change to amiodarone for rate control.  She will need lifelong anticoagulation.  Discontinue Plavix.  Continue heparin.  Transition to apixaban at discharge.  2 lung nodule-further evaluation after she improves.  3 Status post embolic event to lower extremity requiring amputation-followed by vascular surgery.  4 hypertension-patient presently on pressors.  Wean as tolerated.  Will follow blood pressure and adjust regimen as needed.  For questions or updates, please contact [place] Please consult  [email or web address] for contact info under Cardiology/STEMI.      Signed, Kirk Ruths, MD  07/14/2017, 1:57 PM

## 2017-07-14 NOTE — Progress Notes (Signed)
Pt hypotensive, Cardizem gtt off at approx 2300 d/t to hypotension. MD Donzetta Matters notified.  Pt given 500cc NS and CBC, BMP, and LA sent per MD order.  Will continue to monitor pt.

## 2017-07-14 NOTE — Progress Notes (Signed)
PULMONARY / CRITICAL CARE MEDICINE   Name: Shelley [name] MRN: [id number] DOB: [birth year]    ADMISSION DATE:  07/12/2017 CONSULTATION DATE:  07/12/2017  REFERRING MD:  Dr Bridgett Larsson  CHIEF COMPLAINT:  Left foot pain  SUBJECTIVE:  Amputation overnight, hypotensive requiring pressors  VITAL SIGNS: BP 102/74   Pulse (!) 110   Temp 97.7 F (36.5 C) (Oral)   Resp (!) 23   Ht 5\' 5"  (1.651 m)   Wt 67.3 kg (148 lb 5.9 oz)   SpO2 97%   BMI 24.69 kg/m   HEMODYNAMICS: CVP:  [1 mmHg-3 mmHg] 3 mmHg  VENTILATOR SETTINGS:    INTAKE / OUTPUT: I/O last 3 completed shifts: In: 5941.7 [I.V.:3371.7; Blood:610; IV Piggyback:1960] Out: 0277 [AJOIN:8676; Blood:150]  PHYSICAL EXAMINATION: General:  Acute on chronically ill appearing elderly female, NAD Neuro:  Alert and interactive, moving all available ext to command HEENT:  [place]/AT, PERRL, EOM-I and MMM Cardiovascular:  IRIR, Nl S1/S2 and -M/R/G. Lungs:  CTA bilaterally Abdomen:  Soft, NT, ND and +BS Musculoskeletal:  LLE amputation  LABS:  BMET Recent Labs  Lab 07/13/17 0434 07/13/17 2326 07/14/17 0748  NA 136 138 137  K 4.0 3.8 3.7  CL 107 113* 111  CO2 17* 20* 18*  BUN 27* 20 17  CREATININE 0.78 0.65 0.60  GLUCOSE 178* 116* 143*   Electrolytes Recent Labs  Lab 07/12/17 2230 07/13/17 0434 07/13/17 2326 07/14/17 0748  CALCIUM  --  8.7* 7.7* 8.1*  MG 1.6* 2.1  --  1.5*  PHOS 4.0 3.9  --  2.1*   CBC Recent Labs  Lab 07/12/17 1950 07/13/17 2326 07/14/17 0546  WBC 9.9 12.4* 14.3*  HGB 13.4 8.7* 12.3  HCT 40.6 26.4* 37.1  PLT 139* 119* 132*   Coag's Recent Labs  Lab 07/13/17 0434  INR 1.18   Sepsis Markers Recent Labs  Lab 07/12/17 2328 07/13/17 0434 07/13/17 2326  LATICACIDVEN 0.9 1.0 1.1   ABG Recent Labs  Lab 07/12/17 1711 07/12/17 1824  PHART 7.404 7.374  PCO2ART 24.8* 30.5*  PO2ART 76.0* 184.0*   Liver Enzymes Recent Labs  Lab 07/13/17 0434  AST 43*  ALT 20  ALKPHOS 100  BILITOT 1.2   ALBUMIN 3.4*   Cardiac Enzymes No results for input(s): TROPONINI, PROBNP in the last 168 hours.  Glucose Recent Labs  Lab 07/12/17 2054  GLUCAP 121*   Imaging Ct Angio Ao+bifem W & Or Wo Contrast  Result Date: 07/14/2017 CLINICAL DATA:  Peripheral arterial disease.  Ischemia. EXAM: CT ANGIOGRAPHY CHEST, ABDOMEN AND PELVIS, AORTOGRAM AND RUNOFF TECHNIQUE: Multidetector CT imaging through the chest, abdomen and pelvis was performed using the standard protocol during bolus administration of intravenous contrast. Multiplanar reconstructed images and MIPs were obtained and reviewed to evaluate the vascular anatomy. CONTRAST:  150mL ISOVUE-370 IOPAMIDOL (ISOVUE-370) INJECTION 76% COMPARISON:  10/16/2001 FINDINGS: CTA CHEST FINDINGS Cardiovascular: Atherosclerotic calcifications of the aortic arch and great vessels are noted. There is no evidence of aortic dissection or obvious intramural hematoma. Maximal diameter of the ascending aorta is 3.6 cm. There is no obvious acute pulmonary thromboembolism. Great vessels are grossly patent. Vertebral arteries both opacify normally within the confines of the examination. Left vertebral is dominant. Mediastinum/Nodes: There is no abnormal mediastinal adenopathy by measurement criteria. Physiologic pericardial fluid is noted. Thyroid is within normal limits. Esophagus is unremarkable. Lungs/Pleura: No pneumothorax. No pleural effusion. 1.3 cm irregular mass with spiculation in the right upper lobe on image 44. Similar 1.1 cm spiculated mass  in the right upper lobe on image 41. There is dense consolidation and collapse nearly involving the entire left lower lobe. Small peripheral airways appear occluded. No obvious central obstructing airway mass. There is severe narrowing involving the territory segment to the superior segment of the left lower lobe. There is some focal atelectasis versus consolidation at the base of the lingula. Musculoskeletal: No vertebral  compression deformity. Sternum is intact. No acute rib deformity. Review of the MIP images confirms the above findings. CTA ABDOMEN AND PELVIS FINDINGS VASCULAR Aorta: Aorta is nonaneurysmal and patent. Scattered atherosclerotic calcifications in the infrarenal aorta. 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 Branch vessels are patent. 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 SMA: Patent.  Branch vessels grossly patent. Renals: Atherosclerotic calcification is present at the origin of the bilateral single renal arteries. No significant narrowing. IMA: Diminutive and patent. Inflow: Atherosclerotic calcifications are present in the bilateral common iliac arteries without aneurysmal dilatation or significant narrowing. External iliac arteries are also patent with minimal atherosclerotic change. Right internal iliac artery is patent. The main left internal iliac artery is patent. The posterior division is patent. There is abrupt occlusion of the anterior division of the left internal iliac artery there is low-density filling the vessel at this occlusion worrisome for acute thrombosis. Lower extremities: Right common femoral, profunda femoral, and superficial femoral arteries are patent. Right popliteal artery is patent. Tibial vessels are patent for 3 vessel runoff. The left common femoral artery is occluded abruptly. There is very poor filling of muscular branches of the profunda femoral artery. The main profunda femoral artery and superficial femoral artery are also completely occluded. The left popliteal artery is occluded. There is no arterial opacification below the mid thigh. Review of the MIP images confirms the above findings. NON-VASCULAR Hepatobiliary: Diffuse hepatic steatosis. Gallbladder is slightly dilated. There is wall thickening at the fundus of unknown significance. Common bile duct is dilated. Pancreas: Atrophic. Spleen: Unremarkable. Adrenals/Urinary Tract:  Adrenal glands are within normal limits. Chronic changes of the kidneys are present. Bladder is decompressed by a Foley catheter. Stomach/Bowel: Stomach is decompressed. Large duodenal diverticulum involves the descending portion. No evidence of small-bowel obstruction. No obvious mass in the colon. Sigmoid diverticulosis. Lymphatic: No abnormal retroperitoneal adenopathy. Reproductive: Uterus is absent.  Adnexa are unremarkable. Other: There is no free fluid. Musculoskeletal: There is gas within the deep soft tissues of the left lower extremity. There is gas within the popliteal fossa and medial to the proximal left tibia. There is also gas lateral to the mid left leg musculature as well as adjacent to the tibial vasculature of the mid leg. These findings suggest necrotizing fasciitis and infection with gas-forming organism. The musculature within the deep tissues of the left leg are engorged. There is blurring of the fat planes. These findings suggest swelling and possibly ischemic change and impending compartment syndrome. Review of the MIP images confirms the above findings. IMPRESSION: Vascular: There is abrupt occlusion of the left common femoral artery. The profunda femoral, superficial femoral, popliteal artery, and tibial arteries are also non-opacified in the left lower extremity. Diffuse vessel occlusion and thrombosis cannot be excluded. Given gas in the soft tissues of the left lower extremity, ischemia with necrotizing fasciitis and possible dead tissue are of clinical concern. Preliminary report was given during the time of the examination. I am interpreting the examination the following morning after left leg amputation. There is occlusion of the anterior division of the left internal  iliac artery. No evidence of right lower extremity arterial occlusion. 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 No acute vascular abnormality within the thorax. Specifically, no evidence of intraluminal thrombus within  the aorta. No evidence of dissection or intramural hematoma. Nonvascular: There are 2 spiculated masses in the right upper lobe as described. Malignancy is not excluded. PET-CT is recommended. Consolidation and collapse in the left lower lobe as described. Swelling and fullness of the musculature in the left leg worrisome for ischemia an impending compartment syndrome. Electronically Signed   By: Marybelle Killings M.D.   On: 07/14/2017 08:41   Ct Angio Chest Aorta W/cm &/or Wo/cm  Result Date: 07/14/2017 CLINICAL DATA:  Peripheral arterial disease.  Ischemia. EXAM: CT ANGIOGRAPHY CHEST, ABDOMEN AND PELVIS, AORTOGRAM AND RUNOFF TECHNIQUE: Multidetector CT imaging through the chest, abdomen and pelvis was performed using the standard protocol during bolus administration of intravenous contrast. Multiplanar reconstructed images and MIPs were obtained and reviewed to evaluate the vascular anatomy. CONTRAST:  175mL ISOVUE-370 IOPAMIDOL (ISOVUE-370) INJECTION 76% COMPARISON:  10/16/2001 FINDINGS: CTA CHEST FINDINGS Cardiovascular: Atherosclerotic calcifications of the aortic arch and great vessels are noted. There is no evidence of aortic dissection or obvious intramural hematoma. Maximal diameter of the ascending aorta is 3.6 cm. There is no obvious acute pulmonary thromboembolism. Great vessels are grossly patent. Vertebral arteries both opacify normally within the confines of the examination. Left vertebral is dominant. Mediastinum/Nodes: There is no abnormal mediastinal adenopathy by measurement criteria. Physiologic pericardial fluid is noted. Thyroid is within normal limits. Esophagus is unremarkable. Lungs/Pleura: No pneumothorax. No pleural effusion. 1.3 cm irregular mass with spiculation in the right upper lobe on image 44. Similar 1.1 cm spiculated mass in the right upper lobe on image 41. There is dense consolidation and collapse nearly involving the entire left lower lobe. Small peripheral airways appear  occluded. No obvious central obstructing airway mass. There is severe narrowing involving the territory segment to the superior segment of the left lower lobe. There is some focal atelectasis versus consolidation at the base of the lingula. Musculoskeletal: No vertebral compression deformity. Sternum is intact. No acute rib deformity. Review of the MIP images confirms the above findings. CTA ABDOMEN AND PELVIS FINDINGS VASCULAR Aorta: Aorta is nonaneurysmal and patent. Scattered atherosclerotic calcifications in the infrarenal aorta. 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 Branch vessels are patent. 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 SMA: Patent.  Branch vessels grossly patent. Renals: Atherosclerotic calcification is present at the origin of the bilateral single renal arteries. No significant narrowing. IMA: Diminutive and patent. Inflow: Atherosclerotic calcifications are present in the bilateral common iliac arteries without aneurysmal dilatation or significant narrowing. External iliac arteries are also patent with minimal atherosclerotic change. Right internal iliac artery is patent. The main left internal iliac artery is patent. The posterior division is patent. There is abrupt occlusion of the anterior division of the left internal iliac artery there is low-density filling the vessel at this occlusion worrisome for acute thrombosis. Lower extremities: Right common femoral, profunda femoral, and superficial femoral arteries are patent. Right popliteal artery is patent. Tibial vessels are patent for 3 vessel runoff. The left common femoral artery is occluded abruptly. There is very poor filling of muscular branches of the profunda femoral artery. The main profunda femoral artery and superficial femoral artery are also completely occluded. The left popliteal artery is occluded. There is no arterial opacification below the mid thigh. Review of the MIP images  confirms the  above findings. NON-VASCULAR Hepatobiliary: Diffuse hepatic steatosis. Gallbladder is slightly dilated. There is wall thickening at the fundus of unknown significance. Common bile duct is dilated. Pancreas: Atrophic. Spleen: Unremarkable. Adrenals/Urinary Tract: Adrenal glands are within normal limits. Chronic changes of the kidneys are present. Bladder is decompressed by a Foley catheter. Stomach/Bowel: Stomach is decompressed. Large duodenal diverticulum involves the descending portion. No evidence of small-bowel obstruction. No obvious mass in the colon. Sigmoid diverticulosis. Lymphatic: No abnormal retroperitoneal adenopathy. Reproductive: Uterus is absent.  Adnexa are unremarkable. Other: There is no free fluid. Musculoskeletal: There is gas within the deep soft tissues of the left lower extremity. There is gas within the popliteal fossa and medial to the proximal left tibia. There is also gas lateral to the mid left leg musculature as well as adjacent to the tibial vasculature of the mid leg. These findings suggest necrotizing fasciitis and infection with gas-forming organism. The musculature within the deep tissues of the left leg are engorged. There is blurring of the fat planes. These findings suggest swelling and possibly ischemic change and impending compartment syndrome. Review of the MIP images confirms the above findings. IMPRESSION: Vascular: There is abrupt occlusion of the left common femoral artery. The profunda femoral, superficial femoral, popliteal artery, and tibial arteries are also non-opacified in the left lower extremity. Diffuse vessel occlusion and thrombosis cannot be excluded. Given gas in the soft tissues of the left lower extremity, ischemia with necrotizing fasciitis and possible dead tissue are of clinical concern. Preliminary report was given during the time of the examination. I am interpreting the examination the following morning after left leg amputation. There is  occlusion of the anterior division of the left internal iliac artery. No evidence of right lower extremity arterial occlusion. 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 No acute vascular abnormality within the thorax. Specifically, no evidence of intraluminal thrombus within the aorta. No evidence of dissection or intramural hematoma. Nonvascular: There are 2 spiculated masses in the right upper lobe as described. Malignancy is not excluded. PET-CT is recommended. Consolidation and collapse in the left lower lobe as described. Swelling and fullness of the musculature in the left leg worrisome for ischemia an impending compartment syndrome. Electronically Signed   By: Marybelle Killings M.D.   On: 07/14/2017 08:41   STUDIES:  CXR 1/10 > RUL nodular density.  CULTURES: None.  ANTIBIOTICS: None.  SIGNIFICANT EVENTS: 1/10 > admitted with left leg pain, likely ischemic from embolic phenomenon.  Taken to OR.  LINES/TUBES: L IJ CVL 1/10 >   DISCUSSION: 72 y.o. female admitted 1/10 with left leg pain.  Felt to have ischemia; therefore, taken to OR for attempts at thrombectomy.  In OR, found to have dead muscle in all LLE compartments. Per family, vascular planning on amputation in AM 1/11.  ASSESSMENT / PLAN:  PULMONARY Acute Hypoxic Respiratory failure - Titrate O2 for sat of 88-92% - Monitor for airway protection with narcotic use for post-op pain  RUL nodular density. Will need a CT of the chest once more hemodynamically stable    Tobacco dependence Continue nicotine patch.  CARDIOVASCULAR Circulatory shock: - Switch levo to neo if unable to get off in the next 2 hours - CVP check  Atrial fibrillation with RVR Continue diltiazem gtt for rate control through surgery prior to switching to PO Cardiology is following TTE pending Heparin gtt due to needing surgery.  Likely can transition to [place] at DC  Hx HTN; soft BP since post-op: Hold home BP meds;  continue to  monitor  RENAL Hypokalemia, phos and mag AKI Potassium, Mg and Phos repleted Trend BMET, UOP Replace electrolytes as indicated  GASTROINTESTINAL Nutrition. Heart healthy diabetic diet  HEMATOLOGIC Hemoconcentration. VTE Prophylaxis. Thrombocytopenia with baseline 180s Heparin gtt. CBC in AM.  INFECTIOUS No indication of infection. Monitor clinically.  ENDOCRINE No acute issues.  NEUROLOGIC Hx recent CVA EtOh abuse. Continue Thiamine / Folate. Monitor CIWA  Family updated: Patient updated bedside  Interdisciplinary Family Meeting v Palliative Care Meeting:  Due by: 07/19/17.  The patient is critically ill with multiple organ systems failure and requires high complexity decision making for assessment and support, frequent evaluation and titration of therapies, application of advanced monitoring technologies and extensive interpretation of multiple databases.   Critical Care Time devoted to patient care services described in this note is  35  Minutes. This time reflects time of care of this signee Dr Jennet Maduro. This critical care time does not reflect procedure time, or teaching time or supervisory time of PA/NP/Med student/Med Resident etc but could involve care discussion time.  Rush Farmer, M.D. [facility] Pulmonary/Critical Care Medicine. Pager: [phone]. After hours pager: [phone].  07/14/2017, 11:43 AM

## 2017-07-14 NOTE — Progress Notes (Signed)
Pt with continued hypotension despite 500cc NS bolus.  Hgb-13.4 > 8.7.  CCM consulted.  1L LR bolus and 2 units PRBCs ordered.  Will continue to monitor pt.

## 2017-07-14 NOTE — Progress Notes (Signed)
   Called for patient hypotension while I was in OR. She remains hypotensive although improving on 1st of 2 units of blood. Also will receive total 1.5L crystalloid for CVP of 1. Appreciate PCCM care of patient.   Rozena Fierro C. Donzetta Matters, MD [facility] Office: [phone] Pager: [phone]

## 2017-07-14 NOTE — Progress Notes (Signed)
ANTICOAGULATION CONSULT NOTE - Initial Consult  Pharmacy Consult for Heparin Indication: atrial fibrillation  Allergies  Allergen Reactions  . Acyclovir And Related     Patient Measurements: Height: 5\' 5"  (165.1 cm) Weight: 148 lb 5.9 oz (67.3 kg) IBW/kg (Calculated) : 57 Heparin Dosing Weight: 69 kg  Vital Signs: Temp: 97.7 F (36.5 C) (01/12 0842) Temp Source: Oral (01/12 0842) BP: 107/88 (01/12 0900) Pulse Rate: 107 (01/12 0953)  Labs: Recent Labs    07/12/17 1950 07/13/17 0434 07/13/17 2326 07/14/17 0546 07/14/17 0748  HGB 13.4  --  8.7* 12.3  --   HCT 40.6  --  26.4* 37.1  --   PLT 139*  --  119* 132*  --   LABPROT  --  14.9  --   --   --   INR  --  1.18  --   --   --   CREATININE 1.09* 0.78 0.65  --  0.60  CKTOTAL  --  2,279*  --   --   --     Estimated Creatinine Clearance: 57.2 mL/min (by C-G formula based on SCr of 0.6 mg/dL).  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

## 2017-07-14 NOTE — Progress Notes (Signed)
ANTICOAGULATION CONSULT NOTE - Initial Consult  Pharmacy Consult for Heparin Indication: atrial fibrillation  Allergies  Allergen Reactions  . Acyclovir And Related     Patient Measurements: Height: 5\' 5"  (165.1 cm) Weight: 148 lb 5.9 oz (67.3 kg) IBW/kg (Calculated) : 57 Heparin Dosing Weight: 69 kg  Vital Signs: Temp: 97.9 F (36.6 C) (01/12 1632) Temp Source: Oral (01/12 1632) BP: 101/60 (01/12 1800) Pulse Rate: 107 (01/12 1800)  Labs: Recent Labs    07/12/17 1950 07/13/17 0434 07/13/17 2326 07/14/17 0546 07/14/17 0748 07/14/17 1759  HGB 13.4  --  8.7* 12.3  --   --   HCT 40.6  --  26.4* 37.1  --   --   PLT 139*  --  119* 132*  --   --   LABPROT  --  14.9  --   --   --   --   INR  --  1.18  --   --   --   --   HEPARINUNFRC  --   --   --   --   --  0.84*  CREATININE 1.09* 0.78 0.65  --  0.60  --   CKTOTAL  --  2,279*  --   --   --   --     Estimated Creatinine Clearance: 57.2 mL/min (by C-G formula based on SCr of 0.6 mg/dL).  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

## 2017-07-14 NOTE — Progress Notes (Signed)
  Progress Note    07/14/2017 10:21 AM 1 Day Post-Op  Subjective:  "feeling confused"  Vitals:   07/14/17 0953 07/14/17 1000  BP:  104/72  Pulse: (!) 107 85  Resp: (!) 21 20  Temp:    SpO2: 94% 93%    Physical Exam: Oriented to person only  Non labored respirations Abdomen is soft Left groin incision cdi Left aka dressing cdi  CBC    Component Value Date/Time   WBC 14.3 (H) 07/14/2017 0546   RBC 4.07 07/14/2017 0546   HGB 12.3 07/14/2017 0546   HCT 37.1 07/14/2017 0546   PLT 132 (L) 07/14/2017 0546   MCV 91.2 07/14/2017 0546   MCH 30.2 07/14/2017 0546   MCHC 33.2 07/14/2017 0546   RDW 14.1 07/14/2017 0546   LYMPHSABS 2.2 07/12/2017 1545   MONOABS 0.9 07/12/2017 1545   EOSABS 0.0 07/12/2017 1545   BASOSABS 0.0 07/12/2017 1545    BMET    Component Value Date/Time   NA 137 07/14/2017 0748   K 3.7 07/14/2017 0748   CL 111 07/14/2017 0748   CO2 18 (L) 07/14/2017 0748   GLUCOSE 143 (H) 07/14/2017 0748   BUN 17 07/14/2017 0748   CREATININE 0.60 07/14/2017 0748   CALCIUM 8.1 (L) 07/14/2017 0748   GFRNONAA >60 07/14/2017 0748   GFRAA >60 07/14/2017 0748    INR    Component Value Date/Time   INR 1.18 07/13/2017 0434     Intake/Output Summary (Last 24 hours) at 07/14/2017 1021 Last data filed at 07/14/2017 1000 Gross per 24 hour  Intake 5005.66 ml  Output 510 ml  Net 4495.66 ml     Assessment:  72 y.o. female is s/p left lower extremity embolectomy and aka, hypotensive post op now requiring levophed. H/h responded to blood transfusion but she remains hypotensive  Plan: Transition to full dose heparin Dressing down tomorrow Ok for diet as she can tolerate Appreciate critical care intervention   Shaniquia Brafford C. Donzetta Matters, MD [facility] [place] [facility] Office: [phone] Pager: [phone]  07/14/2017 10:21 AM

## 2017-07-14 NOTE — Progress Notes (Signed)
PCCM Interval Note   Patient arrived from PACU hypotensive. Given 1L LR Bolus. STAT Labs sent. Hemoglobin 13.4 > 8.7. 2 units RBC ordered.   Hayden Pedro, AGACNP-BC [place] [facility]  Pgr: [phone]  PCCM Pgr: [phone]

## 2017-07-14 NOTE — Progress Notes (Signed)
[facility] Progress Note Patient Name: Shelley [name] DOB: 07/02/1946 MRN: [id number]   Date of Service  07/14/2017  HPI/Events of Note  Bedside nurse reports that IV in R wrist has infiltrated while infusing K3PO4. R arm/hand now swollen. Bedside nurse reports that the patient has intact brachial, radial and ulnar pulses. Capillary refill estimated at 3 seconds. She has already elevated to R arm and applied a K-Pad.   eICU Interventions  Will order: 1. Monitor for compartment syndrome - assess brachial, radial and ulnar pulses Q 2 hours. Measure R arm circumference Q 4 hours.  2. Will ask ground team to assess at bedside so that they are aware of the clinical situation.      Intervention Category Major Interventions: Other:  Lysle Dingwall 07/14/2017, 8:24 PM

## 2017-07-15 DIAGNOSIS — D696 Thrombocytopenia, unspecified: Secondary | ICD-10-CM

## 2017-07-15 DIAGNOSIS — R918 Other nonspecific abnormal finding of lung field: Secondary | ICD-10-CM

## 2017-07-15 DIAGNOSIS — I5032 Chronic diastolic (congestive) heart failure: Secondary | ICD-10-CM

## 2017-07-15 DIAGNOSIS — I10 Essential (primary) hypertension: Secondary | ICD-10-CM

## 2017-07-15 DIAGNOSIS — F1010 Alcohol abuse, uncomplicated: Secondary | ICD-10-CM

## 2017-07-15 DIAGNOSIS — R410 Disorientation, unspecified: Secondary | ICD-10-CM

## 2017-07-15 LAB — BASIC METABOLIC PANEL
Anion gap: 7 (ref 5–15)
BUN: 14 mg/dL (ref 6–20)
CHLORIDE: 110 mmol/L (ref 101–111)
CO2: 22 mmol/L (ref 22–32)
CREATININE: 0.57 mg/dL (ref 0.44–1.00)
Calcium: 7.8 mg/dL — ABNORMAL LOW (ref 8.9–10.3)
GFR calc Af Amer: >60 mL/min (ref >60)
GFR calc non Af Amer: >60 mL/min (ref >60)
Glucose, Bld: 114 mg/dL — ABNORMAL HIGH (ref 65–99)
Potassium: 3.8 mmol/L (ref 3.5–5.1)
SODIUM: 139 mmol/L (ref 135–145)

## 2017-07-15 LAB — TYPE AND SCREEN
ABO/RH(D): A POS
Antibody Screen: NEGATIVE
UNIT DIVISION: 0
Unit division: 0

## 2017-07-15 LAB — MAGNESIUM: MAGNESIUM: 1.8 mg/dL (ref 1.7–2.4)

## 2017-07-15 LAB — BPAM RBC
Blood Product Expiration Date: 201901282359
Blood Product Expiration Date: 201901282359
ISSUE DATE / TIME: 201901120109
ISSUE DATE / TIME: 201901120238
Unit Type and Rh: 6200
Unit Type and Rh: 6200

## 2017-07-15 LAB — CBC
HEMATOCRIT: 33.1 % — AB (ref 36.0–46.0)
HEMOGLOBIN: 10.9 g/dL — AB (ref 12.0–15.0)
MCH: 30.2 pg (ref 26.0–34.0)
MCHC: 32.9 g/dL (ref 30.0–36.0)
MCV: 91.7 fL (ref 78.0–100.0)
Platelets: 103 10*3/uL — ABNORMAL LOW (ref 150–400)
RBC: 3.61 MIL/uL — AB (ref 3.87–5.11)
RDW: 14.7 % (ref 11.5–15.5)
WBC: 11.1 10*3/uL — AB (ref 4.0–10.5)

## 2017-07-15 LAB — PHOSPHORUS: PHOSPHORUS: 3 mg/dL (ref 2.5–4.6)

## 2017-07-15 LAB — HEPARIN LEVEL (UNFRACTIONATED)
HEPARIN UNFRACTIONATED: 0.67 [IU]/mL (ref 0.30–0.70)
Heparin Unfractionated: 0.4 IU/mL (ref 0.30–0.70)

## 2017-07-15 LAB — GLUCOSE, CAPILLARY: Glucose-Capillary: 105 mg/dL — ABNORMAL HIGH (ref 65–99)

## 2017-07-15 MED ORDER — DILTIAZEM HCL 100 MG IV SOLR: 5.0000 mg/h | INTRAVENOUS | Status: DC

## 2017-07-15 MED ORDER — DIGOXIN 0.25 MG/ML IJ SOLN
0.2500 mg | Freq: Once | INTRAMUSCULAR | Status: AC | PRN
  Administered 2017-07-15: 0.25 mg via INTRAVENOUS
  Filled 2017-07-15: qty 2

## 2017-07-15 NOTE — Progress Notes (Signed)
PROGRESS NOTE    ROGER FASNACHT  VZD:[id number] DOB: 1946/01/25 DOA: 07/12/2017 PCP: [name], MD   Brief Narrative:  72 y.o. WF  PMHx Alcohol abuse, Anxiety, CVA, Seizures, HTN, Osteoporosis Presents with cc: left foot pain.  Patient's memory and cognitive function is limited per her husband, so portions of this history are reconstructed from the EMR and the patient.  The patient is unclear when her left leg pain began.  She says a "few days ago."  The husband notes the patient has been complaining at night of left foot pain over the last two nights.  It is unclear whether the patient previously had any intermittent claudication or rest pain.  Over the last day reportedly she has had marked skin change in the left foot and calf with now paralysis and anesthesia.  She also notes a general sense of poor health with fatigue and repeated coughing.  .     Subjective: 1/13 A/O 3 (did not know when), negative CP, negative SOB, negative abdominal pain currently pleasantly confused (just received pain medication for her LEFT AKA). Follows commands.   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 fib RVR -Cardiology on board -Cardizem drip -Digoxin 0.25 mg 1 -Heparin drip. Once patient stable and closer to time of discharge switch to [place]. Cardiology would prefer Apixaban  Chronic diastolic CHF? -Echocardiogram unable to determine diastolic function secondary to A. fib  -Strict in and out -Daily weight -Transfuse for hemoglobin<8  Essential HTN/Hypotensive -Currently Hypotensive: Multifactorial A. fib RVR, post surgery. - MAP goal> 65 -Continued hypotension start midodrine vs dopamine  Acute Hypoxic Respiratory failure - Titrate O2 for sat of 88-92% -Monitor for airway protection with narcotic use for post-op pain  RUL nodular  density. -CT angiogram: Spiculated mass 2., See results below PET- CT recommended. This well to be a post as outpatient.     Tobacco dependence Continue nicotine patch.  Altered mental status -Judicious use of narcotics and benzodiazepine with patient. Patient sensitive to narcotics.  Hx CVA  -  currently nonissue however if continued low BP and patient's mental status does not improve consider CT head  AKI(baseline Cr<1) Recent Labs  Lab 07/12/17 1553 07/12/17 1950 07/13/17 0434 07/13/17 2326 07/14/17 0748 07/15/17 0437  CREATININE 1.40* 1.09* 0.78 0.65 0.60 0.57  - Baseline  Thrombocytopenia(baseline~180) -Multifactorial EtOH abuse, secondary to surgery -Platelets still low: Currently no signs of active bleeding continue to monitor closely  LEFT AKA -Negative sign of infection, staple line intact. Watch carefully for any possible signs of infection  EtOH abuse -CIWA protocol   DVT prophylaxis: Heparin gtt Code Status: Full Family Communication: None Disposition Plan: TBD   Consultants:  Vascular surgery [facility] M    Procedures/Significant Events:  1/10 PCXR:Persistent irregular nodular density in the right upper lobe  1/11 CT angiogram chest aorta W/W contrast:-There is abrupt occlusion of the left common femoral artery. The profunda femoral, superficial femoral, popliteal artery, and tibial arteries are also non-opacified in the left lower extremity. Diffuse vessel occlusion and thrombosis cannot be excluded. Given gas in the soft tissues of the left lower extremity, ischemia with necrotizing fasciitis and possible dead tissue are of clinical concern. -Occlusion of the anterior division of the left internal iliac artery. 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 - 2x spiculated masses  RUL Malignancy is not excluded. PET-CT is recommended. -Consolidation  and collapse in the left lower lobe -Swelling and fullness of the musculature in the left leg worrisome for  ischemia an impending compartment syndrome. 1/11- Left lower extremity embolectomy/ Left above knee amputation 1/11 Echocardiogram: LVEF; 65% to 70%. -Negative RWMA -Study was not technically sufficient to allow evaluation of LV diastolic dysfunction due to atrial   fibrillation. - Tricuspid valve mild-moderate regurgitation. - Pulmonic valve: There was trivial regurgitation. - Pulmonary arteries: PA peak pressure: 32 mm Hg (S).      I have personally reviewed and interpreted all radiology studies and my findings are as above.  VENTILATOR SETTINGS:   Cultures   Antimicrobials: Anti-infectives (From admission, onward)   Start     Stop   07/13/17 1845  ceFAZolin (ANCEF) IVPB 2g/100 mL premix     07/13/17 1840   07/13/17 1817  ceFAZolin (ANCEF) 2-4 GM/100ML-% IVPB    Comments:  Grace Blight   : cabinet override   07/13/17 1840       Devices    LINES / TUBES:      Continuous Infusions: . sodium chloride 10 mL/hr at 07/15/17 0600  . sodium chloride 50 mL/hr at 07/15/17 0600  . diltiazem (CARDIZEM) infusion 5 mg/hr (07/15/17 0032)  . heparin 900 Units/hr (07/15/17 0600)  . lactated ringers 10 mL/hr at 07/15/17 0600  . levETIRAcetam Stopped (07/14/17 2309)  . norepinephrine (LEVOPHED) Adult infusion Stopped (07/14/17 2307)     Objective: Vitals:   07/15/17 0400 07/15/17 0409 07/15/17 0500 07/15/17 0600  BP:      Pulse: (!) 113  (!) 107 (!) 112  Resp: (!) 22  (!) 22 (!) 22  Temp:  98.2 F (36.8 C)    TempSrc:  Oral    SpO2: 91%  96% 96%  Weight:      Height:        Intake/Output Summary (Last 24 hours) at 07/15/2017 0831 Last data filed at 07/15/2017 0600 Gross per 24 hour  Intake 2395.67 ml  Output 575 ml  Net 1820.67 ml   Filed Weights   07/12/17 1643 07/13/17 1741 07/14/17 0545  Weight: 152 lb 5.4 oz (69.1 kg) 152 lb 5.4 oz (69.1 kg) 148 lb 5.9 oz (67.3 kg)    Examination:  General: A/O 3 (does not know when) follows commands pleasantly  confused, No acute respiratory distress Neck:  Negative scars, masses, torticollis, lymphadenopathy, JVD Lungs: Clear to auscultation bilaterally without wheezes or crackles Cardiovascular: Irregular irregular rhythm and rate without murmur gallop or rub normal S1 and S2 Abdomen: negative abdominal pain, nondistended, positive soft, bowel sounds, no rebound, no ascites, no appreciable mass Extremities: No significant cyanosis, clubbing, or edema of right lower extremity. Left AKA staple line intact negative sign of infection Skin: Negative rashes, lesions, ulcers Psychiatric:  Negative depression, negative anxiety, negative fatigue, negative mania  Central nervous system:  Cranial nerves II through XII intact, tongue/uvula midline, all extremities muscle strength 5/5, sensation intact throughout,  negative dysarthria, negative expressive aphasia, negative receptive aphasia.  .     Data Reviewed: Care during the described time interval was provided by me .  I have reviewed this patient's available data, including medical history, events of note, physical examination, and all test results as part of my evaluation.   CBC: Recent Labs  Lab 07/12/17 1545  07/12/17 1824 07/12/17 1950 07/13/17 2326 07/14/17 0546 07/15/17 0437  WBC 13.6*  --   --  9.9 12.4* 14.3* 11.1*  NEUTROABS 10.4*  --   --   --   --   --   --  HGB 16.1*   < > 12.9 13.4 8.7* 12.3 10.9*  HCT 48.4*   < > 38.0 40.6 26.4* 37.1 33.1*  MCV 92.0  --   --  92.1 93.6 91.2 91.7  PLT 176  --   --  139* 119* 132* 103*   < > = values in this interval not displayed.   Basic Metabolic Panel: Recent Labs  Lab 07/12/17 1950 07/12/17 2230 07/13/17 0434 07/13/17 2326 07/14/17 0748 07/15/17 0437  NA 138  --  136 138 137 139  K 3.1*  --  4.0 3.8 3.7 3.8  CL 107  --  107 113* 111 110  CO2 21*  --  17* 20* 18* 22  GLUCOSE 129*  --  178* 116* 143* 114*  BUN 36*  --  27* 20 17 14   CREATININE 1.09*  --  0.78 0.65 0.60 0.57   CALCIUM 8.1*  --  8.7* 7.7* 8.1* 7.8*  MG  --  1.6* 2.1  --  1.5* 1.8  PHOS  --  4.0 3.9  --  2.1* 3.0   GFR: Estimated Creatinine Clearance: 57.2 mL/min (by C-G formula based on SCr of 0.57 mg/dL). Liver Function Tests: Recent Labs  Lab 07/13/17 0434  AST 43*  ALT 20  ALKPHOS 100  BILITOT 1.2  PROT 6.0*  ALBUMIN 3.4*   No results for input(s): LIPASE, AMYLASE in the last 168 hours. No results for input(s): AMMONIA in the last 168 hours. Coagulation Profile: Recent Labs  Lab 07/13/17 0434  INR 1.18   Cardiac Enzymes: Recent Labs  Lab 07/13/17 0434  CKTOTAL 2,279*   BNP (last 3 results) No results for input(s): PROBNP in the last 8760 hours. HbA1C: No results for input(s): HGBA1C in the last 72 hours. CBG: Recent Labs  Lab 07/12/17 2054 07/14/17 2020 07/14/17 2348 07/15/17 0408  GLUCAP 121* 134* 114* 105*   Lipid Profile: No results for input(s): CHOL, HDL, LDLCALC, TRIG, CHOLHDL, LDLDIRECT in the last 72 hours. Thyroid Function Tests: No results for input(s): TSH, T4TOTAL, FREET4, T3FREE, THYROIDAB in the last 72 hours. Anemia Panel: No results for input(s): VITAMINB12, FOLATE, FERRITIN, TIBC, IRON, RETICCTPCT in the last 72 hours. Urine analysis:    Component Value Date/Time   COLORURINE YELLOW 06/08/2017 2055   APPEARANCEUR HAZY (A) 06/08/2017 2055   LABSPEC 1.014 06/08/2017 2055   PHURINE 7.0 06/08/2017 2055   GLUCOSEU NEGATIVE 06/08/2017 2055   HGBUR NEGATIVE 06/08/2017 2055   BILIRUBINUR NEGATIVE 06/08/2017 2055   KETONESUR 20 (A) 06/08/2017 2055   PROTEINUR NEGATIVE 06/08/2017 2055   UROBILINOGEN 0.2 06/14/2010 0741   NITRITE NEGATIVE 06/08/2017 2055   LEUKOCYTESUR NEGATIVE 06/08/2017 2055   Sepsis Labs: @LABRCNTIP (procalcitonin:4,lacticidven:4)  ) Recent Results (from the past 240 hour(s))  MRSA PCR Screening     Status: None   Collection Time: 07/12/17 11:20 PM  Result Value Ref Range Status   MRSA by PCR NEGATIVE NEGATIVE Final     Comment:        The GeneXpert MRSA Assay (FDA approved for NASAL specimens only), is one component of a comprehensive MRSA colonization surveillance program. It is not intended to diagnose MRSA infection nor to guide or monitor treatment for MRSA infections.   Surgical pcr screen     Status: None   Collection Time: 07/13/17 11:15 AM  Result Value Ref Range Status   MRSA, PCR NEGATIVE NEGATIVE Final   Staphylococcus aureus NEGATIVE NEGATIVE Final    Comment: (NOTE) The Xpert SA Assay (FDA  approved for NASAL specimens in patients 56 years of age and older), is one component of a comprehensive surveillance program. It is not intended to diagnose infection nor to guide or monitor treatment.          Radiology Studies: Ct Angio Ao+bifem W & Or Wo Contrast  Result Date: 07/14/2017 CLINICAL DATA:  Peripheral arterial disease.  Ischemia. EXAM: CT ANGIOGRAPHY CHEST, ABDOMEN AND PELVIS, AORTOGRAM AND RUNOFF TECHNIQUE: Multidetector CT imaging through the chest, abdomen and pelvis was performed using the standard protocol during bolus administration of intravenous contrast. Multiplanar reconstructed images and MIPs were obtained and reviewed to evaluate the vascular anatomy. CONTRAST:  122mL ISOVUE-370 IOPAMIDOL (ISOVUE-370) INJECTION 76% COMPARISON:  10/16/2001 FINDINGS: CTA CHEST FINDINGS Cardiovascular: Atherosclerotic calcifications of the aortic arch and great vessels are noted. There is no evidence of aortic dissection or obvious intramural hematoma. Maximal diameter of the ascending aorta is 3.6 cm. There is no obvious acute pulmonary thromboembolism. Great vessels are grossly patent. Vertebral arteries both opacify normally within the confines of the examination. Left vertebral is dominant. Mediastinum/Nodes: There is no abnormal mediastinal adenopathy by measurement criteria. Physiologic pericardial fluid is noted. Thyroid is within normal limits. Esophagus is unremarkable. Lungs/Pleura:  No pneumothorax. No pleural effusion. 1.3 cm irregular mass with spiculation in the right upper lobe on image 44. Similar 1.1 cm spiculated mass in the right upper lobe on image 41. There is dense consolidation and collapse nearly involving the entire left lower lobe. Small peripheral airways appear occluded. No obvious central obstructing airway mass. There is severe narrowing involving the territory segment to the superior segment of the left lower lobe. There is some focal atelectasis versus consolidation at the base of the lingula. Musculoskeletal: No vertebral compression deformity. Sternum is intact. No acute rib deformity. Review of the MIP images confirms the above findings. CTA ABDOMEN AND PELVIS FINDINGS VASCULAR Aorta: Aorta is nonaneurysmal and patent. Scattered atherosclerotic calcifications in the infrarenal aorta. 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 Branch vessels are patent. 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 SMA: Patent.  Branch vessels grossly patent. Renals: Atherosclerotic calcification is present at the origin of the bilateral single renal arteries. No significant narrowing. IMA: Diminutive and patent. Inflow: Atherosclerotic calcifications are present in the bilateral common iliac arteries without aneurysmal dilatation or significant narrowing. External iliac arteries are also patent with minimal atherosclerotic change. Right internal iliac artery is patent. The main left internal iliac artery is patent. The posterior division is patent. There is abrupt occlusion of the anterior division of the left internal iliac artery there is low-density filling the vessel at this occlusion worrisome for acute thrombosis. Lower extremities: Right common femoral, profunda femoral, and superficial femoral arteries are patent. Right popliteal artery is patent. Tibial vessels are patent for 3 vessel runoff. The left common femoral artery is occluded abruptly.  There is very poor filling of muscular branches of the profunda femoral artery. The main profunda femoral artery and superficial femoral artery are also completely occluded. The left popliteal artery is occluded. There is no arterial opacification below the mid thigh. Review of the MIP images confirms the above findings. NON-VASCULAR Hepatobiliary: Diffuse hepatic steatosis. Gallbladder is slightly dilated. There is wall thickening at the fundus of unknown significance. Common bile duct is dilated. Pancreas: Atrophic. Spleen: Unremarkable. Adrenals/Urinary Tract: Adrenal glands are within normal limits. Chronic changes of the kidneys are present. Bladder is decompressed by a Foley catheter. Stomach/Bowel: Stomach is decompressed. Large duodenal  diverticulum involves the descending portion. No evidence of small-bowel obstruction. No obvious mass in the colon. Sigmoid diverticulosis. Lymphatic: No abnormal retroperitoneal adenopathy. Reproductive: Uterus is absent.  Adnexa are unremarkable. Other: There is no free fluid. Musculoskeletal: There is gas within the deep soft tissues of the left lower extremity. There is gas within the popliteal fossa and medial to the proximal left tibia. There is also gas lateral to the mid left leg musculature as well as adjacent to the tibial vasculature of the mid leg. These findings suggest necrotizing fasciitis and infection with gas-forming organism. The musculature within the deep tissues of the left leg are engorged. There is blurring of the fat planes. These findings suggest swelling and possibly ischemic change and impending compartment syndrome. Review of the MIP images confirms the above findings. IMPRESSION: Vascular: There is abrupt occlusion of the left common femoral artery. The profunda femoral, superficial femoral, popliteal artery, and tibial arteries are also non-opacified in the left lower extremity. Diffuse vessel occlusion and thrombosis cannot be excluded. Given  gas in the soft tissues of the left lower extremity, ischemia with necrotizing fasciitis and possible dead tissue are of clinical concern. Preliminary report was given during the time of the examination. I am interpreting the examination the following morning after left leg amputation. There is occlusion of the anterior division of the left internal iliac artery. No evidence of right lower extremity arterial occlusion. 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 No acute vascular abnormality within the thorax. Specifically, no evidence of intraluminal thrombus within the aorta. No evidence of dissection or intramural hematoma. Nonvascular: There are 2 spiculated masses in the right upper lobe as described. Malignancy is not excluded. PET-CT is recommended. Consolidation and collapse in the left lower lobe as described. Swelling and fullness of the musculature in the left leg worrisome for ischemia an impending compartment syndrome. Electronically Signed   By: Marybelle Killings M.D.   On: 07/14/2017 08:41   Ct Angio Chest Aorta W/cm &/or Wo/cm  Result Date: 07/14/2017 CLINICAL DATA:  Peripheral arterial disease.  Ischemia. EXAM: CT ANGIOGRAPHY CHEST, ABDOMEN AND PELVIS, AORTOGRAM AND RUNOFF TECHNIQUE: Multidetector CT imaging through the chest, abdomen and pelvis was performed using the standard protocol during bolus administration of intravenous contrast. Multiplanar reconstructed images and MIPs were obtained and reviewed to evaluate the vascular anatomy. CONTRAST:  167mL ISOVUE-370 IOPAMIDOL (ISOVUE-370) INJECTION 76% COMPARISON:  10/16/2001 FINDINGS: CTA CHEST FINDINGS Cardiovascular: Atherosclerotic calcifications of the aortic arch and great vessels are noted. There is no evidence of aortic dissection or obvious intramural hematoma. Maximal diameter of the ascending aorta is 3.6 cm. There is no obvious acute pulmonary thromboembolism. Great vessels are grossly patent. Vertebral arteries both opacify normally within  the confines of the examination. Left vertebral is dominant. Mediastinum/Nodes: There is no abnormal mediastinal adenopathy by measurement criteria. Physiologic pericardial fluid is noted. Thyroid is within normal limits. Esophagus is unremarkable. Lungs/Pleura: No pneumothorax. No pleural effusion. 1.3 cm irregular mass with spiculation in the right upper lobe on image 44. Similar 1.1 cm spiculated mass in the right upper lobe on image 41. There is dense consolidation and collapse nearly involving the entire left lower lobe. Small peripheral airways appear occluded. No obvious central obstructing airway mass. There is severe narrowing involving the territory segment to the superior segment of the left lower lobe. There is some focal atelectasis versus consolidation at the base of the lingula. Musculoskeletal: No vertebral compression deformity. Sternum is intact. No acute rib deformity. Review of the  MIP images confirms the above findings. CTA ABDOMEN AND PELVIS FINDINGS VASCULAR Aorta: Aorta is nonaneurysmal and patent. Scattered atherosclerotic calcifications in the infrarenal aorta. 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 Branch vessels are patent. 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 SMA: Patent.  Branch vessels grossly patent. Renals: Atherosclerotic calcification is present at the origin of the bilateral single renal arteries. No significant narrowing. IMA: Diminutive and patent. Inflow: Atherosclerotic calcifications are present in the bilateral common iliac arteries without aneurysmal dilatation or significant narrowing. External iliac arteries are also patent with minimal atherosclerotic change. Right internal iliac artery is patent. The main left internal iliac artery is patent. The posterior division is patent. There is abrupt occlusion of the anterior division of the left internal iliac artery there is low-density filling the vessel at this occlusion  worrisome for acute thrombosis. Lower extremities: Right common femoral, profunda femoral, and superficial femoral arteries are patent. Right popliteal artery is patent. Tibial vessels are patent for 3 vessel runoff. The left common femoral artery is occluded abruptly. There is very poor filling of muscular branches of the profunda femoral artery. The main profunda femoral artery and superficial femoral artery are also completely occluded. The left popliteal artery is occluded. There is no arterial opacification below the mid thigh. Review of the MIP images confirms the above findings. NON-VASCULAR Hepatobiliary: Diffuse hepatic steatosis. Gallbladder is slightly dilated. There is wall thickening at the fundus of unknown significance. Common bile duct is dilated. Pancreas: Atrophic. Spleen: Unremarkable. Adrenals/Urinary Tract: Adrenal glands are within normal limits. Chronic changes of the kidneys are present. Bladder is decompressed by a Foley catheter. Stomach/Bowel: Stomach is decompressed. Large duodenal diverticulum involves the descending portion. No evidence of small-bowel obstruction. No obvious mass in the colon. Sigmoid diverticulosis. Lymphatic: No abnormal retroperitoneal adenopathy. Reproductive: Uterus is absent.  Adnexa are unremarkable. Other: There is no free fluid. Musculoskeletal: There is gas within the deep soft tissues of the left lower extremity. There is gas within the popliteal fossa and medial to the proximal left tibia. There is also gas lateral to the mid left leg musculature as well as adjacent to the tibial vasculature of the mid leg. These findings suggest necrotizing fasciitis and infection with gas-forming organism. The musculature within the deep tissues of the left leg are engorged. There is blurring of the fat planes. These findings suggest swelling and possibly ischemic change and impending compartment syndrome. Review of the MIP images confirms the above findings. IMPRESSION:  Vascular: There is abrupt occlusion of the left common femoral artery. The profunda femoral, superficial femoral, popliteal artery, and tibial arteries are also non-opacified in the left lower extremity. Diffuse vessel occlusion and thrombosis cannot be excluded. Given gas in the soft tissues of the left lower extremity, ischemia with necrotizing fasciitis and possible dead tissue are of clinical concern. Preliminary report was given during the time of the examination. I am interpreting the examination the following morning after left leg amputation. There is occlusion of the anterior division of the left internal iliac artery. No evidence of right lower extremity arterial occlusion. 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 No acute vascular abnormality within the thorax. Specifically, no evidence of intraluminal thrombus within the aorta. No evidence of dissection or intramural hematoma. Nonvascular: There are 2 spiculated masses in the right upper lobe as described. Malignancy is not excluded. PET-CT is recommended. Consolidation and collapse in the left lower lobe as described. Swelling and fullness of the musculature in the  left leg worrisome for ischemia an impending compartment syndrome. Electronically Signed   By: Marybelle Killings M.D.   On: 07/14/2017 08:41        Scheduled Meds: . atorvastatin  20 mg Oral Daily  . chlorhexidine  15 mL Mouth Rinse BID  . folic acid  1 mg Intravenous Daily  . mouth rinse  15 mL Mouth Rinse q12n4p  . nicotine  21 mg Transdermal Daily  . thiamine  100 mg Intravenous Daily   Continuous Infusions: . sodium chloride 10 mL/hr at 07/15/17 0600  . sodium chloride 50 mL/hr at 07/15/17 0600  . diltiazem (CARDIZEM) infusion 5 mg/hr (07/15/17 0032)  . heparin 900 Units/hr (07/15/17 0600)  . lactated ringers 10 mL/hr at 07/15/17 0600  . levETIRAcetam Stopped (07/14/17 2309)  . norepinephrine (LEVOPHED) Adult infusion Stopped (07/14/17 2307)     LOS: 3 days    Time  spent: 40 minutes    Arshia Spellman, Geraldo Docker, MD [facility] Pager [phone]   If 7PM-7AM, please contact night-coverage [email or web address] Password [facility] 07/15/2017, 8:31 AM

## 2017-07-15 NOTE — Progress Notes (Signed)
ANTICOAGULATION CONSULT NOTE - Initial Consult  Pharmacy Consult for Heparin Indication: atrial fibrillation  Allergies  Allergen Reactions  . Acyclovir And Related     Patient Measurements: Height: 5\' 5"  (165.1 cm) Weight: 148 lb 5.9 oz (67.3 kg) IBW/kg (Calculated) : 57 Heparin Dosing Weight: 69 kg  Vital Signs: Temp: 97.5 F (36.4 C) (01/13 1255) Temp Source: Oral (01/13 1255) BP: 70/59 (01/13 1400) Pulse Rate: 75 (01/13 1500)  Labs: Recent Labs    07/13/17 0434 07/13/17 2326 07/14/17 0546 07/14/17 0748 07/14/17 1759 07/15/17 0437 07/15/17 1445  HGB  --  8.7* 12.3  --   --  10.9*  --   HCT  --  26.4* 37.1  --   --  33.1*  --   PLT  --  119* 132*  --   --  103*  --   LABPROT 14.9  --   --   --   --   --   --   INR 1.18  --   --   --   --   --   --   HEPARINUNFRC  --   --   --   --  0.84* 0.67 0.40  CREATININE 0.78 0.65  --  0.60  --  0.57  --   CKTOTAL 2,279*  --   --   --   --   --   --     Estimated Creatinine Clearance: 57.2 mL/min (by C-G formula based on SCr of 0.57 mg/dL).  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

## 2017-07-15 NOTE — Progress Notes (Signed)
[place] for Heparin Indication: atrial fibrillation  Allergies  Allergen Reactions  . Acyclovir And Related     Patient Measurements: Height: 5\' 5"  (165.1 cm) Weight: 148 lb 5.9 oz (67.3 kg) IBW/kg (Calculated) : 57 Heparin Dosing Weight: 69 kg  Vital Signs: Temp: 98.2 F (36.8 C) (01/13 0409) Temp Source: Oral (01/13 0409) BP: 88/59 (01/12 2000) Pulse Rate: 108 (01/13 0100)  Labs: Recent Labs    07/13/17 0434 07/13/17 2326 07/14/17 0546 07/14/17 0748 07/14/17 1759 07/15/17 0437  HGB  --  8.7* 12.3  --   --  10.9*  HCT  --  26.4* 37.1  --   --  33.1*  PLT  --  119* 132*  --   --  PENDING  LABPROT 14.9  --   --   --   --   --   INR 1.18  --   --   --   --   --   HEPARINUNFRC  --   --   --   --  0.84* 0.67  CREATININE 0.78 0.65  --  0.60  --  0.57  CKTOTAL 2,279*  --   --   --   --   --     Estimated Creatinine Clearance: 57.2 mL/min (by C-G formula based on SCr of 0.57 mg/dL).  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

## 2017-07-15 NOTE — Progress Notes (Signed)
RN called d/t pt desat 86-88% on 6 lpm [place].  Noted pt appears in a-fib, HR 130's-150's.  Increased flow to 11 lpm, sat increased 92%.  Encouraged deep breathing and coughing.  No distress noted currently.  RN aware.

## 2017-07-15 NOTE — Progress Notes (Signed)
  Progress Note    07/15/2017 7:34 AM 2 Days Post-Op  Subjective:  Disoriented over night  Vitals:   07/15/17 0500 07/15/17 0600  BP:    Pulse: (!) 107 (!) 112  Resp: (!) 22 (!) 22  Temp:    SpO2: 96% 96%    Physical Exam: Not oriented  Non labored respirations Left groin incision cdi Left aka site with some ecchymosis, warm  CBC    Component Value Date/Time   WBC 11.1 (H) 07/15/2017 0437   RBC 3.61 (L) 07/15/2017 0437   HGB 10.9 (L) 07/15/2017 0437   HCT 33.1 (L) 07/15/2017 0437   PLT 103 (L) 07/15/2017 0437   MCV 91.7 07/15/2017 0437   MCH 30.2 07/15/2017 0437   MCHC 32.9 07/15/2017 0437   RDW 14.7 07/15/2017 0437   LYMPHSABS 2.2 07/12/2017 1545   MONOABS 0.9 07/12/2017 1545   EOSABS 0.0 07/12/2017 1545   BASOSABS 0.0 07/12/2017 1545    BMET    Component Value Date/Time   NA 139 07/15/2017 0437   K 3.8 07/15/2017 0437   CL 110 07/15/2017 0437   CO2 22 07/15/2017 0437   GLUCOSE 114 (H) 07/15/2017 0437   BUN 14 07/15/2017 0437   CREATININE 0.57 07/15/2017 0437   CALCIUM 7.8 (L) 07/15/2017 0437   GFRNONAA >60 07/15/2017 0437   GFRAA >60 07/15/2017 0437    INR    Component Value Date/Time   INR 1.18 07/13/2017 0434     Intake/Output Summary (Last 24 hours) at 07/15/2017 0734 Last data filed at 07/15/2017 0600 Gross per 24 hour  Intake 2493.17 ml  Output 675 ml  Net 1818.17 ml     Assessment:  72 y.o. female is s/p left lower extremity embolectomy and aka. Confused this a.m.   Plan: Continue full dose heparin Aka site in tact and appears to be perfused Appreciate critical care intervention   Brandon C. Donzetta Matters, MD [facility] Office: [phone] Pager: [phone]  07/15/2017 7:34 AM

## 2017-07-15 NOTE — Progress Notes (Signed)
Progress Note  Patient Name: Shelley [name] Date of Encounter: 07/15/2017  Primary Cardiologist: Cristopher Peru, MD   Subjective   No CP or dyspnea; coughing  Inpatient Medications    Scheduled Meds: . atorvastatin  20 mg Oral Daily  . chlorhexidine  15 mL Mouth Rinse BID  . folic acid  1 mg Intravenous Daily  . mouth rinse  15 mL Mouth Rinse q12n4p  . nicotine  21 mg Transdermal Daily  . thiamine  100 mg Intravenous Daily   Continuous Infusions: . sodium chloride 10 mL/hr at 07/15/17 0600  . sodium chloride 50 mL/hr (07/15/17 1000)  . diltiazem (CARDIZEM) infusion 5 mg/hr (07/15/17 0930)  . heparin 900 Units/hr (07/15/17 0900)  . lactated ringers 10 mL/hr at 07/15/17 0600  . levETIRAcetam 500 mg (07/15/17 0930)  . norepinephrine (LEVOPHED) Adult infusion Stopped (07/14/17 2307)   PRN Meds: alum & mag hydroxide-simeth, hydrALAZINE, labetalol, LORazepam **OR** LORazepam, metoprolol tartrate, morphine injection, ondansetron, oxyCODONE-acetaminophen, polyethylene glycol   Vital Signs    Vitals:   07/15/17 0409 07/15/17 0500 07/15/17 0600 07/15/17 0836  BP:      Pulse:  (!) 107 (!) 112   Resp:  (!) 22 (!) 22   Temp: 98.2 F (36.8 C)   (!) 97.3 F (36.3 C)  TempSrc: Oral   Oral  SpO2:  96% 96%   Weight:      Height:        Intake/Output Summary (Last 24 hours) at 07/15/2017 1027 Last data filed at 07/15/2017 0930 Gross per 24 hour  Intake 2422.67 ml  Output 750 ml  Net 1672.67 ml   Filed Weights   07/12/17 1643 07/13/17 1741 07/14/17 0545  Weight: 152 lb 5.4 oz (69.1 kg) 152 lb 5.4 oz (69.1 kg) 148 lb 5.9 oz (67.3 kg)    Telemetry    Atrial fibrillation with elevated rate- Personally Reviewed  Physical Exam   GEN: WD, elderly No acute distress.   Neck: supple Cardiac: irregular and tachycardic, no murmur Respiratory: mild rhonchi GI: Soft, nontender, non-distended, no masses MS: s/p amputation Neuro:  mildly confused; no gross focal  findinds   Labs    Chemistry Recent Labs  Lab 07/13/17 0434 07/13/17 2326 07/14/17 0748 07/15/17 0437  NA 136 138 137 139  K 4.0 3.8 3.7 3.8  CL 107 113* 111 110  CO2 17* 20* 18* 22  GLUCOSE 178* 116* 143* 114*  BUN 27* 20 17 14   CREATININE 0.78 0.65 0.60 0.57  CALCIUM 8.7* 7.7* 8.1* 7.8*  PROT 6.0*  --   --   --   ALBUMIN 3.4*  --   --   --   AST 43*  --   --   --   ALT 20  --   --   --   ALKPHOS 100  --   --   --   BILITOT 1.2  --   --   --   GFRNONAA >60 >60 >60 >60  GFRAA >60 >60 >60 >60  ANIONGAP 12 5 8 7      Hematology Recent Labs  Lab 07/13/17 2326 07/14/17 0546 07/15/17 0437  WBC 12.4* 14.3* 11.1*  RBC 2.82* 4.07 3.61*  HGB 8.7* 12.3 10.9*  HCT 26.4* 37.1 33.1*  MCV 93.6 91.2 91.7  MCH 30.9 30.2 30.2  MCHC 33.0 33.2 32.9  RDW 14.6 14.1 14.7  PLT 119* 132* 103*    Recent Labs  Lab 07/12/17 1552  TROPIPOC 0.03  Radiology    Ct Angio Ao+bifem W & Or Wo Contrast  Result Date: 07/14/2017 CLINICAL DATA:  Peripheral arterial disease.  Ischemia. EXAM: CT ANGIOGRAPHY CHEST, ABDOMEN AND PELVIS, AORTOGRAM AND RUNOFF TECHNIQUE: Multidetector CT imaging through the chest, abdomen and pelvis was performed using the standard protocol during bolus administration of intravenous contrast. Multiplanar reconstructed images and MIPs were obtained and reviewed to evaluate the vascular anatomy. CONTRAST:  129mL ISOVUE-370 IOPAMIDOL (ISOVUE-370) INJECTION 76% COMPARISON:  10/16/2001 FINDINGS: CTA CHEST FINDINGS Cardiovascular: Atherosclerotic calcifications of the aortic arch and great vessels are noted. There is no evidence of aortic dissection or obvious intramural hematoma. Maximal diameter of the ascending aorta is 3.6 cm. There is no obvious acute pulmonary thromboembolism. Great vessels are grossly patent. Vertebral arteries both opacify normally within the confines of the examination. Left vertebral is dominant. Mediastinum/Nodes: There is no abnormal mediastinal  adenopathy by measurement criteria. Physiologic pericardial fluid is noted. Thyroid is within normal limits. Esophagus is unremarkable. Lungs/Pleura: No pneumothorax. No pleural effusion. 1.3 cm irregular mass with spiculation in the right upper lobe on image 44. Similar 1.1 cm spiculated mass in the right upper lobe on image 41. There is dense consolidation and collapse nearly involving the entire left lower lobe. Small peripheral airways appear occluded. No obvious central obstructing airway mass. There is severe narrowing involving the territory segment to the superior segment of the left lower lobe. There is some focal atelectasis versus consolidation at the base of the lingula. Musculoskeletal: No vertebral compression deformity. Sternum is intact. No acute rib deformity. Review of the MIP images confirms the above findings. CTA ABDOMEN AND PELVIS FINDINGS VASCULAR Aorta: Aorta is nonaneurysmal and patent. Scattered atherosclerotic calcifications in the infrarenal aorta. 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 Branch vessels are patent. 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 SMA: Patent.  Branch vessels grossly patent. Renals: Atherosclerotic calcification is present at the origin of the bilateral single renal arteries. No significant narrowing. IMA: Diminutive and patent. Inflow: Atherosclerotic calcifications are present in the bilateral common iliac arteries without aneurysmal dilatation or significant narrowing. External iliac arteries are also patent with minimal atherosclerotic change. Right internal iliac artery is patent. The main left internal iliac artery is patent. The posterior division is patent. There is abrupt occlusion of the anterior division of the left internal iliac artery there is low-density filling the vessel at this occlusion worrisome for acute thrombosis. Lower extremities: Right common femoral, profunda femoral, and superficial femoral  arteries are patent. Right popliteal artery is patent. Tibial vessels are patent for 3 vessel runoff. The left common femoral artery is occluded abruptly. There is very poor filling of muscular branches of the profunda femoral artery. The main profunda femoral artery and superficial femoral artery are also completely occluded. The left popliteal artery is occluded. There is no arterial opacification below the mid thigh. Review of the MIP images confirms the above findings. NON-VASCULAR Hepatobiliary: Diffuse hepatic steatosis. Gallbladder is slightly dilated. There is wall thickening at the fundus of unknown significance. Common bile duct is dilated. Pancreas: Atrophic. Spleen: Unremarkable. Adrenals/Urinary Tract: Adrenal glands are within normal limits. Chronic changes of the kidneys are present. Bladder is decompressed by a Foley catheter. Stomach/Bowel: Stomach is decompressed. Large duodenal diverticulum involves the descending portion. No evidence of small-bowel obstruction. No obvious mass in the colon. Sigmoid diverticulosis. Lymphatic: No abnormal retroperitoneal adenopathy. Reproductive: Uterus is absent.  Adnexa are unremarkable. Other: There is no free fluid. Musculoskeletal: There is  gas within the deep soft tissues of the left lower extremity. There is gas within the popliteal fossa and medial to the proximal left tibia. There is also gas lateral to the mid left leg musculature as well as adjacent to the tibial vasculature of the mid leg. These findings suggest necrotizing fasciitis and infection with gas-forming organism. The musculature within the deep tissues of the left leg are engorged. There is blurring of the fat planes. These findings suggest swelling and possibly ischemic change and impending compartment syndrome. Review of the MIP images confirms the above findings. IMPRESSION: Vascular: There is abrupt occlusion of the left common femoral artery. The profunda femoral, superficial femoral,  popliteal artery, and tibial arteries are also non-opacified in the left lower extremity. Diffuse vessel occlusion and thrombosis cannot be excluded. Given gas in the soft tissues of the left lower extremity, ischemia with necrotizing fasciitis and possible dead tissue are of clinical concern. Preliminary report was given during the time of the examination. I am interpreting the examination the following morning after left leg amputation. There is occlusion of the anterior division of the left internal iliac artery. No evidence of right lower extremity arterial occlusion. 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 No acute vascular abnormality within the thorax. Specifically, no evidence of intraluminal thrombus within the aorta. No evidence of dissection or intramural hematoma. Nonvascular: There are 2 spiculated masses in the right upper lobe as described. Malignancy is not excluded. PET-CT is recommended. Consolidation and collapse in the left lower lobe as described. Swelling and fullness of the musculature in the left leg worrisome for ischemia an impending compartment syndrome. Electronically Signed   By: Marybelle Killings M.D.   On: 07/14/2017 08:41   Ct Angio Chest Aorta W/cm &/or Wo/cm  Result Date: 07/14/2017 CLINICAL DATA:  Peripheral arterial disease.  Ischemia. EXAM: CT ANGIOGRAPHY CHEST, ABDOMEN AND PELVIS, AORTOGRAM AND RUNOFF TECHNIQUE: Multidetector CT imaging through the chest, abdomen and pelvis was performed using the standard protocol during bolus administration of intravenous contrast. Multiplanar reconstructed images and MIPs were obtained and reviewed to evaluate the vascular anatomy. CONTRAST:  143mL ISOVUE-370 IOPAMIDOL (ISOVUE-370) INJECTION 76% COMPARISON:  10/16/2001 FINDINGS: CTA CHEST FINDINGS Cardiovascular: Atherosclerotic calcifications of the aortic arch and great vessels are noted. There is no evidence of aortic dissection or obvious intramural hematoma. Maximal diameter of the  ascending aorta is 3.6 cm. There is no obvious acute pulmonary thromboembolism. Great vessels are grossly patent. Vertebral arteries both opacify normally within the confines of the examination. Left vertebral is dominant. Mediastinum/Nodes: There is no abnormal mediastinal adenopathy by measurement criteria. Physiologic pericardial fluid is noted. Thyroid is within normal limits. Esophagus is unremarkable. Lungs/Pleura: No pneumothorax. No pleural effusion. 1.3 cm irregular mass with spiculation in the right upper lobe on image 44. Similar 1.1 cm spiculated mass in the right upper lobe on image 41. There is dense consolidation and collapse nearly involving the entire left lower lobe. Small peripheral airways appear occluded. No obvious central obstructing airway mass. There is severe narrowing involving the territory segment to the superior segment of the left lower lobe. There is some focal atelectasis versus consolidation at the base of the lingula. Musculoskeletal: No vertebral compression deformity. Sternum is intact. No acute rib deformity. Review of the MIP images confirms the above findings. CTA ABDOMEN AND PELVIS FINDINGS VASCULAR Aorta: Aorta is nonaneurysmal and patent. Scattered atherosclerotic calcifications in the infrarenal aorta. 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 Branch vessels are patent. 1.8 cm  splenic artery aneurysm is stable compared with 2,003. SMA: Patent.  Branch vessels grossly patent. Renals: Atherosclerotic calcification is present at the origin of the bilateral single renal arteries. No significant narrowing. IMA: Diminutive and patent. Inflow: Atherosclerotic calcifications are present in the bilateral common iliac arteries without aneurysmal dilatation or significant narrowing. External iliac arteries are also patent with minimal atherosclerotic change. Right internal iliac artery is patent. The main left internal iliac artery is patent. The posterior  division is patent. There is abrupt occlusion of the anterior division of the left internal iliac artery there is low-density filling the vessel at this occlusion worrisome for acute thrombosis. Lower extremities: Right common femoral, profunda femoral, and superficial femoral arteries are patent. Right popliteal artery is patent. Tibial vessels are patent for 3 vessel runoff. The left common femoral artery is occluded abruptly. There is very poor filling of muscular branches of the profunda femoral artery. The main profunda femoral artery and superficial femoral artery are also completely occluded. The left popliteal artery is occluded. There is no arterial opacification below the mid thigh. Review of the MIP images confirms the above findings. NON-VASCULAR Hepatobiliary: Diffuse hepatic steatosis. Gallbladder is slightly dilated. There is wall thickening at the fundus of unknown significance. Common bile duct is dilated. Pancreas: Atrophic. Spleen: Unremarkable. Adrenals/Urinary Tract: Adrenal glands are within normal limits. Chronic changes of the kidneys are present. Bladder is decompressed by a Foley catheter. Stomach/Bowel: Stomach is decompressed. Large duodenal diverticulum involves the descending portion. No evidence of small-bowel obstruction. No obvious mass in the colon. Sigmoid diverticulosis. Lymphatic: No abnormal retroperitoneal adenopathy. Reproductive: Uterus is absent.  Adnexa are unremarkable. Other: There is no free fluid. Musculoskeletal: There is gas within the deep soft tissues of the left lower extremity. There is gas within the popliteal fossa and medial to the proximal left tibia. There is also gas lateral to the mid left leg musculature as well as adjacent to the tibial vasculature of the mid leg. These findings suggest necrotizing fasciitis and infection with gas-forming organism. The musculature within the deep tissues of the left leg are engorged. There is blurring of the fat planes.  These findings suggest swelling and possibly ischemic change and impending compartment syndrome. Review of the MIP images confirms the above findings. IMPRESSION: Vascular: There is abrupt occlusion of the left common femoral artery. The profunda femoral, superficial femoral, popliteal artery, and tibial arteries are also non-opacified in the left lower extremity. Diffuse vessel occlusion and thrombosis cannot be excluded. Given gas in the soft tissues of the left lower extremity, ischemia with necrotizing fasciitis and possible dead tissue are of clinical concern. Preliminary report was given during the time of the examination. I am interpreting the examination the following morning after left leg amputation. There is occlusion of the anterior division of the left internal iliac artery. No evidence of right lower extremity arterial occlusion. 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 No acute vascular abnormality within the thorax. Specifically, no evidence of intraluminal thrombus within the aorta. No evidence of dissection or intramural hematoma. Nonvascular: There are 2 spiculated masses in the right upper lobe as described. Malignancy is not excluded. PET-CT is recommended. Consolidation and collapse in the left lower lobe as described. Swelling and fullness of the musculature in the left leg worrisome for ischemia an impending compartment syndrome. Electronically Signed   By: Marybelle Killings M.D.   On: 07/14/2017 08:41    Patient Profile     72 y.o. female with recent CVA admitted with  embolic event to left lower extremity requiring amputation.  Noted to be in atrial fibrillation.  Cardiology asked to evaluate.  Echo shows normal LV function, trace aortic insufficiency, mildly dilated aortic root, mild to moderate tricuspid regurgitation.  Assessment & Plan    1 Persistent atrial fibrillation-patient remains in atrial fibrillation.  HR is elevated and BP improved. Increase cardizem to 10 mg/hr and follow.  Could add digoxin if needed. She will need lifelong anticoagulation. Continue heparin.  Transition to apixaban at discharge.  2 lung nodule-Will need additional imaging as she improves  3 Status post embolic event to lower extremity requiring amputation-followed by vascular surgery.  4 hypertension-Pressors now off; follow BP and adjust regimen as needed.  For questions or updates, please contact [place] Please consult [email or web address] for contact info under Cardiology/STEMI.      Signed, Kirk Ruths, MD  07/15/2017, 10:27 AM

## 2017-07-16 ENCOUNTER — Inpatient Hospital Stay (HOSPITAL_COMMUNITY): Payer: Medicare Other

## 2017-07-16 LAB — CBC
HEMATOCRIT: 31.9 % — AB (ref 36.0–46.0)
HEMOGLOBIN: 10.4 g/dL — AB (ref 12.0–15.0)
MCH: 30.1 pg (ref 26.0–34.0)
MCHC: 32.6 g/dL (ref 30.0–36.0)
MCV: 92.2 fL (ref 78.0–100.0)
Platelets: 114 10*3/uL — ABNORMAL LOW (ref 150–400)
RBC: 3.46 MIL/uL — AB (ref 3.87–5.11)
RDW: 14.6 % (ref 11.5–15.5)
WBC: 10.6 10*3/uL — ABNORMAL HIGH (ref 4.0–10.5)

## 2017-07-16 LAB — HEPARIN LEVEL (UNFRACTIONATED): Heparin Unfractionated: 0.41 IU/mL (ref 0.30–0.70)

## 2017-07-16 LAB — BASIC METABOLIC PANEL
ANION GAP: 8 (ref 5–15)
BUN: 11 mg/dL (ref 6–20)
CHLORIDE: 107 mmol/L (ref 101–111)
CO2: 22 mmol/L (ref 22–32)
Calcium: 8 mg/dL — ABNORMAL LOW (ref 8.9–10.3)
Creatinine, Ser: 0.56 mg/dL (ref 0.44–1.00)
GFR calc non Af Amer: >60 mL/min (ref >60)
Glucose, Bld: 107 mg/dL — ABNORMAL HIGH (ref 65–99)
POTASSIUM: 3.6 mmol/L (ref 3.5–5.1)
SODIUM: 137 mmol/L (ref 135–145)

## 2017-07-16 LAB — MAGNESIUM: MAGNESIUM: 1.5 mg/dL — AB (ref 1.7–2.4)

## 2017-07-16 MED ORDER — ADULT MULTIVITAMIN W/MINERALS CH
1.0000 | ORAL_TABLET | Freq: Every day | ORAL | Status: DC
  Administered 2017-07-16 – 2017-07-21 (×5): 1 via ORAL
  Filled 2017-07-16 (×7): qty 1

## 2017-07-16 MED ORDER — LEVETIRACETAM 500 MG PO TABS
500.0000 mg | ORAL_TABLET | Freq: Two times a day (BID) | ORAL | Status: DC
  Administered 2017-07-16 – 2017-07-20 (×6): 500 mg via ORAL
  Filled 2017-07-16 (×7): qty 1

## 2017-07-16 MED ORDER — ENSURE ENLIVE PO LIQD
237.0000 mL | Freq: Three times a day (TID) | ORAL | Status: DC
  Administered 2017-07-16 – 2017-07-24 (×14): 237 mL via ORAL

## 2017-07-16 MED ORDER — MORPHINE SULFATE (PF) 2 MG/ML IV SOLN
1.0000 mg | INTRAVENOUS | Status: DC | PRN
  Administered 2017-07-17 – 2017-07-18 (×3): 1 mg via INTRAVENOUS
  Administered 2017-07-19: 2 mg via INTRAVENOUS
  Filled 2017-07-16 (×4): qty 1

## 2017-07-16 MED ORDER — MAGNESIUM SULFATE 2 GM/50ML IV SOLN
2.0000 g | Freq: Once | INTRAVENOUS | Status: AC
  Administered 2017-07-16: 2 g via INTRAVENOUS
  Filled 2017-07-16: qty 50

## 2017-07-16 MED ORDER — FOLIC ACID 1 MG PO TABS
1.0000 mg | ORAL_TABLET | Freq: Every day | ORAL | Status: DC
  Administered 2017-07-17 – 2017-07-21 (×4): 1 mg via ORAL
  Filled 2017-07-16 (×7): qty 1

## 2017-07-16 MED ORDER — OXYCODONE HCL 5 MG PO TABS
5.0000 mg | ORAL_TABLET | Freq: Four times a day (QID) | ORAL | Status: DC | PRN
  Administered 2017-07-16 – 2017-07-21 (×7): 5 mg via ORAL
  Administered 2017-07-26 – 2017-07-28 (×3): 10 mg via ORAL
  Filled 2017-07-16 (×4): qty 1
  Filled 2017-07-16: qty 2
  Filled 2017-07-16 (×4): qty 1
  Filled 2017-07-16 (×2): qty 2

## 2017-07-16 MED ORDER — SODIUM CHLORIDE 0.9% FLUSH: 10.0000 mL | INTRAVENOUS | Status: DC | PRN

## 2017-07-16 MED ORDER — VANCOMYCIN HCL 500 MG IV SOLR
500.0000 mg | Freq: Two times a day (BID) | INTRAVENOUS | Status: DC
  Administered 2017-07-16 – 2017-07-18 (×5): 500 mg via INTRAVENOUS
  Filled 2017-07-16 (×6): qty 500

## 2017-07-16 MED ORDER — DEXTROSE 5 % IV SOLN
1.0000 g | Freq: Three times a day (TID) | INTRAVENOUS | Status: DC
  Administered 2017-07-16 – 2017-07-20 (×11): 1 g via INTRAVENOUS
  Filled 2017-07-16 (×13): qty 1

## 2017-07-16 MED ORDER — SODIUM CHLORIDE 0.9% FLUSH
10.0000 mL | Freq: Two times a day (BID) | INTRAVENOUS | Status: DC
  Administered 2017-07-17 – 2017-07-29 (×18): 10 mL
  Administered 2017-07-29: 3 mL
  Administered 2017-07-30: 10 mL

## 2017-07-16 MED ORDER — MAGNESIUM OXIDE 400 (241.3 MG) MG PO TABS
400.0000 mg | ORAL_TABLET | Freq: Two times a day (BID) | ORAL | Status: DC
  Administered 2017-07-16 – 2017-07-21 (×11): 400 mg via ORAL
  Filled 2017-07-16 (×14): qty 1

## 2017-07-16 MED ORDER — CHLORHEXIDINE GLUCONATE CLOTH 2 % EX PADS
6.0000 | MEDICATED_PAD | Freq: Every day | CUTANEOUS | Status: DC
  Administered 2017-07-17 – 2017-07-29 (×11): 6 via TOPICAL

## 2017-07-16 MED ORDER — VITAMIN B-1 100 MG PO TABS
100.0000 mg | ORAL_TABLET | Freq: Every day | ORAL | Status: DC
  Administered 2017-07-17 – 2017-07-31 (×13): 100 mg via ORAL
  Filled 2017-07-16 (×17): qty 1

## 2017-07-16 NOTE — Progress Notes (Signed)
Pharmacy Antibiotic Note  Shelley [name] is a 72 y.o. female admitted on 07/12/2017 with pneumonia.  Pharmacy has been consulted for Vancomycin dosing.  Also on cefepime  Plan: Cefepime 1 gram iv Q 8 hours Vancomycin 500 mg iv Q 12 hours  Height: 5\' 5"  (165.1 cm) Weight: 148 lb 5.9 oz (67.3 kg) IBW/kg (Calculated) : 57  Temp (24hrs), Avg:98.3 F (36.8 C), Min:97.3 F (36.3 C), Max:100.4 F (38 C)  Recent Labs  Lab 07/12/17 1554 07/12/17 1950 07/12/17 2328 07/13/17 0434 07/13/17 2326 07/14/17 0546 07/14/17 0748 07/15/17 0437 07/16/17 0522  WBC  --  9.9  --   --  12.4* 14.3*  --  11.1* 10.6*  CREATININE  --  1.09*  --  0.78 0.65  --  0.60 0.57 0.56  LATICACIDVEN 2.01*  --  0.9 1.0 1.1  --   --   --   --     Estimated Creatinine Clearance: 57.2 mL/min (by C-G formula based on SCr of 0.56 mg/dL).    Allergies  Allergen Reactions  . Acyclovir And Related     Thank you for allowing pharmacy to be a part of this patient's care.  Tad Moore 07/16/2017 7:41 PM

## 2017-07-16 NOTE — Progress Notes (Signed)
[place] TEAM 1 - Stepdown/ICU TEAM  Shelley [name]  ZLD:[id number] DOB: 05-04-1946 DOA: 07/12/2017 PCP: [name], MD    Brief Narrative:  72yo F w/ a Hx of Alcohol abuse, Anxiety, CVA, Seizures, HTN, dementia, and Osteoporosis who presented w/ left foot pain.    Significant Events: 1/10 admit w/ L leg pain - taken to OR emergently w/ extensive thrombectomy - L IJ CVL 1/11 L AKA and L common femoral thrombectomy   Subjective: The patient is alert and interactive but confused.  Her nurse tells me she has eaten essentially nothing since her last trip to the OR.  She has been suffering with significant hypoxia today with her sats dropping into the low 80s when laying flat.  She is requiring high flow nasal cannula.  She denies any complaints but with her confusion her history is not felt to be reliable.  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 as per VVS - appears to be stable from this standpoint   Persistent atrial fibrillation Currently requiring Cardizem drip - Cardiology managing - cont heparin gtt and plan for transition to apixiban prior to d/c   Altered mental status patient sensitive to narcotics - avoid sedatives as able   Chronic grade 2 diastolic CHF (per TTE Dec 2018) No evidence of signif volume overload at this time - follow weights Filed Weights   07/12/17 1643 07/13/17 1741 07/14/17 0545  Weight: 69.1 kg (152 lb 5.4 oz) 69.1 kg (152 lb 5.4 oz) 67.3 kg (148 lb 5.9 oz)    Hypotension  Resolved at this time   Acute Hypoxic Respiratory failure requiring high flow [place] O2 support - suspect signif ATX and poor inspiratory effort - check CXR - sit pt up and push OT/PT as much as possible   Hypomagnesemia Replace and follow   RUL nodular density  CT angio noted spiculated mass 2 - PET recommended as outpt   Tobacco dependence Continue nicotine patch  Hx CVA   Thrombocytopenia (baseline~180) Likely due to EtOH abuse  EtOH abuse CIWA  protocol in place   DVT prophylaxis: heparin gtt  Code Status: FULL CODE Family Communication: no family present at time of exam  Disposition Plan: SDU   Consultants:  VVS PCCM Cardiology   Antimicrobials:  none  Objective: Blood pressure 98/66, pulse (!) 109, temperature 98.2 F (36.8 C), temperature source Oral, resp. rate (!) 23, height 5\' 5"  (1.651 m), weight 67.3 kg (148 lb 5.9 oz), SpO2 94 %.  Intake/Output Summary (Last 24 hours) at 07/16/2017 0938 Last data filed at 07/16/2017 0900 Gross per 24 hour  Intake 1300.09 ml  Output 1095 ml  Net 205.09 ml   Filed Weights   07/12/17 1643 07/13/17 1741 07/14/17 0545  Weight: 69.1 kg (152 lb 5.4 oz) 69.1 kg (152 lb 5.4 oz) 67.3 kg (148 lb 5.9 oz)    Examination: General: No acute respiratory distress at rest - requiring 10L HFNC O2 Lungs: Clear to auscultation bilaterally without wheezes or crackles - poor air movement B bases Cardiovascular: irreg irreg - no rub or M  Abdomen: Nontender, nondistended, soft, bowel sounds positive, no rebound, no ascites, no appreciable mass Extremities: L AKA site dressed and dry - no edema R LE   CBC: Recent Labs  Lab 07/12/17 1545  07/12/17 1950 07/13/17 2326 07/14/17 0546 07/15/17 0437 07/16/17 0522  WBC 13.6*  --  9.9 12.4* 14.3* 11.1* 10.6*  NEUTROABS 10.4*  --   --   --   --   --   --  HGB 16.1*   < > 13.4 8.7* 12.3 10.9* 10.4*  HCT 48.4*   < > 40.6 26.4* 37.1 33.1* 31.9*  MCV 92.0  --  92.1 93.6 91.2 91.7 92.2  PLT 176  --  139* 119* 132* 103* 114*   < > = values in this interval not displayed.   Basic Metabolic Panel: Recent Labs  Lab 07/12/17 2230 07/13/17 0434 07/13/17 2326 07/14/17 0748 07/15/17 0437 07/16/17 0522  NA  --  136 138 137 139 137  K  --  4.0 3.8 3.7 3.8 3.6  CL  --  107 113* 111 110 107  CO2  --  17* 20* 18* 22 22  GLUCOSE  --  178* 116* 143* 114* 107*  BUN  --  27* 20 17 14 11   CREATININE  --  0.78 0.65 0.60 0.57 0.56  CALCIUM  --  8.7* 7.7*  8.1* 7.8* 8.0*  MG 1.6* 2.1  --  1.5* 1.8 1.5*  PHOS 4.0 3.9  --  2.1* 3.0  --    GFR: Estimated Creatinine Clearance: 57.2 mL/min (by C-G formula based on SCr of 0.56 mg/dL).  Liver Function Tests: Recent Labs  Lab 07/13/17 0434  AST 43*  ALT 20  ALKPHOS 100  BILITOT 1.2  PROT 6.0*  ALBUMIN 3.4*    Coagulation Profile: Recent Labs  Lab 07/13/17 0434  INR 1.18    Cardiac Enzymes: Recent Labs  Lab 07/13/17 0434  CKTOTAL 2,279*    HbA1C: Hgb A1c MFr Bld  Date/Time Value Ref Range Status  06/09/2017 05:13 AM 5.6 4.8 - 5.6 % Final    Comment:    (NOTE) Pre diabetes:          5.7%-6.4% Diabetes:              >6.4% Glycemic control for   <7.0% adults with diabetes     CBG: Recent Labs  Lab 07/12/17 2054 07/14/17 2020 07/14/17 2348 07/15/17 0408  GLUCAP 121* 134* 114* 105*    Recent Results (from the past 240 hour(s))  MRSA PCR Screening     Status: None   Collection Time: 07/12/17 11:20 PM  Result Value Ref Range Status   MRSA by PCR NEGATIVE NEGATIVE Final    Comment:        The GeneXpert MRSA Assay (FDA approved for NASAL specimens only), is one component of a comprehensive MRSA colonization surveillance program. It is not intended to diagnose MRSA infection nor to guide or monitor treatment for MRSA infections.   Surgical pcr screen     Status: None   Collection Time: 07/13/17 11:15 AM  Result Value Ref Range Status   MRSA, PCR NEGATIVE NEGATIVE Final   Staphylococcus aureus NEGATIVE NEGATIVE Final    Comment: (NOTE) The Xpert SA Assay (FDA approved for NASAL specimens in patients 45 years of age and older), is one component of a comprehensive surveillance program. It is not intended to diagnose infection nor to guide or monitor treatment.      Scheduled Meds: . atorvastatin  20 mg Oral Daily  . chlorhexidine  15 mL Mouth Rinse BID  . folic acid  1 mg Intravenous Daily  . mouth rinse  15 mL Mouth Rinse q12n4p  . nicotine  21 mg  Transdermal Daily  . thiamine  100 mg Intravenous Daily   Continuous Infusions: . sodium chloride Stopped (07/15/17 1600)  . sodium chloride 10 mL/hr at 07/15/17 1810  . diltiazem (CARDIZEM) infusion 10 mg/hr (07/16/17  3817)  . heparin 900 Units/hr (07/15/17 0900)  . lactated ringers 10 mL/hr at 07/15/17 0600  . levETIRAcetam Stopped (07/15/17 2233)  . norepinephrine (LEVOPHED) Adult infusion Stopped (07/14/17 2307)     LOS: 4 days   Cherene Altes, MD [facility] Office  [phone] Pager - Text Page per Amion as per below:  On-Call/Text Page:      [email or web address]      password [email or web address]  If 7PM-7AM, please contact night-coverage [email or web address] Password [facility] 07/16/2017, 9:38 AM

## 2017-07-16 NOTE — Progress Notes (Signed)
ANTICOAGULATION CONSULT NOTE - Follow Up Consult  Pharmacy Consult for Heparin Indication: atrial fibrillation  Allergies  Allergen Reactions  . Acyclovir And Related     Patient Measurements: Height: 5\' 5"  (165.1 cm) Weight: 148 lb 5.9 oz (67.3 kg) IBW/kg (Calculated) : 57 Heparin Dosing Weight: 69 kg  Vital Signs: Temp: 98.3 F (36.8 C) (01/14 1115) Temp Source: Oral (01/14 1115) BP: 109/77 (01/14 1200) Pulse Rate: 109 (01/14 1300)  Labs: Recent Labs    07/14/17 0546 07/14/17 0748  07/15/17 0437 07/15/17 1445 07/16/17 0522  HGB 12.3  --   --  10.9*  --  10.4*  HCT 37.1  --   --  33.1*  --  31.9*  PLT 132*  --   --  103*  --  114*  HEPARINUNFRC  --   --    < > 0.67 0.40 0.41  CREATININE  --  0.60  --  0.57  --  0.56   < > = values in this interval not displayed.    Estimated Creatinine Clearance: 57.2 mL/min (by C-G formula based on SCr of 0.56 mg/dL).  Assessment: CC/HPI: leg pain, claudication  PMH: tobacco, HTN, EtOH, anxiety, seizure, embolic CVA   Anticoag: Heparin for new-onset of Afib causing thrombosis, left leg discoloration s/p thrombectomy 1/10, recent CVA on 06/08/17 -s/p L femoral TE in addition to L AKA  -resumed full dose 1/12 am per dr Donzetta Matters -hep lvl within goal 0.41  Renal: SCr 0.56  Heme/Onc: H&H 10.4/31.9, Plt 114  Goal of Therapy:  Heparin level 0.3-0.7 units/ml Monitor platelets by anticoagulation protocol: Yes   Plan:  Continue 900 units/hr Daily HL, CBC F/U long-term AC plans  Albertina Parr, PharmD., BCPS Clinical Pharmacist Pager [phone]

## 2017-07-16 NOTE — Progress Notes (Addendum)
  Progress Note    07/16/2017 9:09 AM 3 Days Post-Op  Subjective:  No new complaints this morning   Vitals:   07/16/17 0747 07/16/17 0815  BP:    Pulse:  98  Resp:  18  Temp: 98.2 F (36.8 C)   SpO2:  95%   Physical Exam: Lungs:  Non labored Incisions:  L AKA incision with minimal sanguinous collection on dressing; mild stump edema, mild stump ecchymosis; no purulence or obvious infection; L groin incision dry and intact Abdomen:  soft Neurologic: confused  CBC    Component Value Date/Time   WBC 10.6 (H) 07/16/2017 0522   RBC 3.46 (L) 07/16/2017 0522   HGB 10.4 (L) 07/16/2017 0522   HCT 31.9 (L) 07/16/2017 0522   PLT 114 (L) 07/16/2017 0522   MCV 92.2 07/16/2017 0522   MCH 30.1 07/16/2017 0522   MCHC 32.6 07/16/2017 0522   RDW 14.6 07/16/2017 0522   LYMPHSABS 2.2 07/12/2017 1545   MONOABS 0.9 07/12/2017 1545   EOSABS 0.0 07/12/2017 1545   BASOSABS 0.0 07/12/2017 1545    BMET    Component Value Date/Time   NA 137 07/16/2017 0522   K 3.6 07/16/2017 0522   CL 107 07/16/2017 0522   CO2 22 07/16/2017 0522   GLUCOSE 107 (H) 07/16/2017 0522   BUN 11 07/16/2017 0522   CREATININE 0.56 07/16/2017 0522   CALCIUM 8.0 (L) 07/16/2017 0522   GFRNONAA >60 07/16/2017 0522   GFRAA >60 07/16/2017 0522    INR    Component Value Date/Time   INR 1.18 07/13/2017 0434     Intake/Output Summary (Last 24 hours) at 07/16/2017 0909 Last data filed at 07/16/2017 0800 Gross per 24 hour  Intake 1376.09 ml  Output 1095 ml  Net 281.09 ml     Assessment/Plan:  72 y.o. female is s/p LLE thrombectomy and AKA 3 Days Post-Op   Continue dressing changes to L AKA stump Continue IV heparin; transition to Eliquis at d/c per cardiology Medical management per primary team  Dagoberto Ligas, PA-C [facility] [phone] 07/16/2017 9:09 AM  Addendum  I have independently interviewed and examined the patient, and I agree with the physician assistant's findings.   Appreciate Internal Med/PCCM taking over care of this patient will multiple co-morbidities.  - will continue to check on L AKA stump - Some hematoma expected as anticoagulation was necessary in this case.  Adele Barthel, MD, FACS [facility] [place] Office: [phone] Pager: [phone]  07/16/2017, 12:46 PM

## 2017-07-16 NOTE — Progress Notes (Addendum)
Initial Nutrition Assessment  DOCUMENTATION CODES:   Non-severe (moderate) malnutrition in context of acute illness/injury  INTERVENTION:    Ensure Enlive po TID, each supplement provides 350 kcal and 20 grams of protein  Multivitamin daily  NUTRITION DIAGNOSIS:   Moderate Malnutrition related to acute illness(stroke 1.5 months ago) as evidenced by mild muscle depletion, percent weight loss(7% weight loss within 1 month).  GOAL:   Patient will meet greater than or equal to 90% of their needs  MONITOR:   PO intake, Supplement acceptance, Skin  REASON FOR ASSESSMENT:   Low [place]    ASSESSMENT:   72 yo female with PMH of CVA, osteoporosis, alcohol abuse, HTN, anxiety, seizures who was admitted on 1/10 with L foot pain; s/p L femoral  thrombectomy and L AKA on 1/11.  Spoke with patient, her husband, and her brother who report that patient has been eating very poorly since admission. She has lost weight since last admission in December for stroke. She was in Rehab for a few weeks after acute admission for stroke. She likes vanilla Ensure supplements. Per discussion with RN, patient has only had bites of her meals since admission.  Labs reviewed. Medications reviewed and include folic acid, mag-ox, thiamine.   NUTRITION - FOCUSED PHYSICAL EXAM:    Most Recent Value  Orbital Region  No depletion  Upper Arm Region  No depletion  Thoracic and Lumbar Region  Unable to assess  Buccal Region  No depletion  Temple Region  Mild depletion  Clavicle Bone Region  Mild depletion  Clavicle and Acromion Bone Region  Mild depletion  Scapular Bone Region  Unable to assess  Dorsal Hand  Mild depletion  Patellar Region  Unable to assess  Anterior Thigh Region  Unable to assess  Posterior Calf Region  Unable to assess  Edema (RD Assessment)  Unable to assess  Hair  Reviewed  Eyes  Reviewed  Mouth  Reviewed  Skin  Reviewed  Nails  Reviewed       Diet Order:  Diet regular Room  service appropriate? Yes; Fluid consistency: Thin  EDUCATION NEEDS:   No education needs have been identified at this time  Skin:  Skin Assessment: Skin Integrity Issues: Skin Integrity Issues:: Incisions Incisions: L leg AKA  Last BM:  PTA  Height:   Ht Readings from Last 1 Encounters:  07/13/17 5\' 5"  (1.651 m)    Weight:   Wt Readings from Last 1 Encounters:  07/14/17 148 lb 5.9 oz (67.3 kg)    Ideal Body Weight:  52.3 kg  BMI:  26.8 (adjusted for AKA)  Estimated Nutritional Needs:   Kcal:  1800-2000  Protein:  90-110 gm  Fluid:  >/= 1.8 L   Molli Barrows, RD, LDN, [place] Pager [phone] After Hours Pager [phone]

## 2017-07-16 NOTE — Progress Notes (Signed)
Progress Note  Patient Name: Shelley [name] Date of Encounter: 07/16/2017  Primary Cardiologist: Cristopher Peru, MD   Subjective   Confused at baseline.  Feeling better.  No chest pain.  Inpatient Medications    Scheduled Meds: . atorvastatin  20 mg Oral Daily  . chlorhexidine  15 mL Mouth Rinse BID  . folic acid  1 mg Oral Daily  . levETIRAcetam  500 mg Oral BID  . magnesium oxide  400 mg Oral BID  . mouth rinse  15 mL Mouth Rinse q12n4p  . nicotine  21 mg Transdermal Daily  . thiamine  100 mg Oral Daily   Continuous Infusions: . sodium chloride 10 mL/hr at 07/15/17 1810  . diltiazem (CARDIZEM) infusion 10 mg/hr (07/16/17 0256)  . heparin 900 Units/hr (07/16/17 1000)  . magnesium sulfate 1 - 4 g bolus IVPB     PRN Meds: alum & mag hydroxide-simeth, metoprolol tartrate, morphine injection, ondansetron, oxyCODONE, polyethylene glycol   Vital Signs    Vitals:   07/16/17 0800 07/16/17 0815 07/16/17 0900 07/16/17 1000  BP: 98/66   108/76  Pulse: (!) 101 98 (!) 109 (!) 103  Resp: 20 18 (!) 23 16  Temp:      TempSrc:      SpO2: 97% 95% 94% 97%  Weight:      Height:        Intake/Output Summary (Last 24 hours) at 07/16/2017 1053 Last data filed at 07/16/2017 1005 Gross per 24 hour  Intake 1360.09 ml  Output 1095 ml  Net 265.09 ml   Filed Weights   07/12/17 1643 07/13/17 1741 07/14/17 0545  Weight: 152 lb 5.4 oz (69.1 kg) 152 lb 5.4 oz (69.1 kg) 148 lb 5.9 oz (67.3 kg)    Telemetry    Atrial fibrillation heart rate under better control.  Currently a proximally 100-110.- Personally Reviewed  ECG    Atrial fibrillation with rapid ventricular response- Personally Reviewed  Physical Exam   GEN: No acute distress.   Neck: No JVD, central access in place acute hypoxic respiratory failure Cardiac:  Irregularly irregular, no murmurs, rubs, or gallops.  Respiratory: Clear to auscultation bilaterally. GI: Soft, nontender, non-distended  MS:  Left AKA Neuro:   Nonfocal  Psych: Confused baseline  Labs    Chemistry Recent Labs  Lab 07/13/17 0434  07/14/17 0748 07/15/17 0437 07/16/17 0522  NA 136   < > 137 139 137  K 4.0   < > 3.7 3.8 3.6  CL 107   < > 111 110 107  CO2 17*   < > 18* 22 22  GLUCOSE 178*   < > 143* 114* 107*  BUN 27*   < > 17 14 11   CREATININE 0.78   < > 0.60 0.57 0.56  CALCIUM 8.7*   < > 8.1* 7.8* 8.0*  PROT 6.0*  --   --   --   --   ALBUMIN 3.4*  --   --   --   --   AST 43*  --   --   --   --   ALT 20  --   --   --   --   ALKPHOS 100  --   --   --   --   BILITOT 1.2  --   --   --   --   GFRNONAA >60   < > >60 >60 >60  GFRAA >60   < > >60 >60 >60  ANIONGAP  12   < > 8 7 8    < > = values in this interval not displayed.     Hematology Recent Labs  Lab 07/14/17 0546 07/15/17 0437 07/16/17 0522  WBC 14.3* 11.1* 10.6*  RBC 4.07 3.61* 3.46*  HGB 12.3 10.9* 10.4*  HCT 37.1 33.1* 31.9*  MCV 91.2 91.7 92.2  MCH 30.2 30.2 30.1  MCHC 33.2 32.9 32.6  RDW 14.1 14.7 14.6  PLT 132* 103* 114*    Cardiac EnzymesNo results for input(s): TROPONINI in the last 168 hours.  Recent Labs  Lab 07/12/17 1552  TROPIPOC 0.03     BNPNo results for input(s): BNP, PROBNP in the last 168 hours.   DDimer No results for input(s): DDIMER in the last 168 hours.   Radiology    No results found.  Cardiac Studies   Echocardiogram-normal LV function, trace AI, mildly dilated aortic root, mild to moderate tricuspid regurgitation.  Patient Profile     72 y.o. female recent stroke, embolic event to left lower extremity requiring amputation.  Atrial fibrillation.  Assessment & Plan    Persistent atrial fibrillation - Diltiazem drip currently at 10.  Maintaining adequate rate control.  We will hopefully be able to transition to p.o. diltiazem soon.  Single injection of 0.25 mg of digoxin administered on 07/15/17 at 1:24 PM..  Requirements for lifelong anticoagulation, transition to Eliquis at discharge.  Currently on IV  heparin.  Embolic event lower extremity requiring amputation -Vascular surgery  Essential hypertension -Previous pressor use.  Off.  Acute hypoxic respiratory failure -High flow oxygen.  Pulmonary toilet.  Right upper lobe nodular lung mass -Spiculated mass x2-PET scan recommended as outpatient.  Tobacco use -Continue to encourage cessation.  Alcohol use -Protocol in place  For questions or updates, please contact [place] Please consult [email or web address] for contact info under Cardiology/STEMI.      Signed, Candee Furbish, MD  07/16/2017, 10:53 AM

## 2017-07-17 DIAGNOSIS — E440 Moderate protein-calorie malnutrition: Secondary | ICD-10-CM

## 2017-07-17 DIAGNOSIS — R0902 Hypoxemia: Secondary | ICD-10-CM

## 2017-07-17 DIAGNOSIS — J90 Pleural effusion, not elsewhere classified: Secondary | ICD-10-CM

## 2017-07-17 DIAGNOSIS — R911 Solitary pulmonary nodule: Secondary | ICD-10-CM

## 2017-07-17 DIAGNOSIS — Z720 Tobacco use: Secondary | ICD-10-CM

## 2017-07-17 DIAGNOSIS — J9811 Atelectasis: Secondary | ICD-10-CM

## 2017-07-17 LAB — CBC
HCT: 28.7 % — ABNORMAL LOW (ref 36.0–46.0)
Hemoglobin: 9.6 g/dL — ABNORMAL LOW (ref 12.0–15.0)
MCH: 30.8 pg (ref 26.0–34.0)
MCHC: 33.4 g/dL (ref 30.0–36.0)
MCV: 92 fL (ref 78.0–100.0)
PLATELETS: 146 10*3/uL — AB (ref 150–400)
RBC: 3.12 MIL/uL — ABNORMAL LOW (ref 3.87–5.11)
RDW: 14.5 % (ref 11.5–15.5)
WBC: 13.3 10*3/uL — ABNORMAL HIGH (ref 4.0–10.5)

## 2017-07-17 LAB — BASIC METABOLIC PANEL
Anion gap: 8 (ref 5–15)
BUN: 13 mg/dL (ref 6–20)
CO2: 23 mmol/L (ref 22–32)
CREATININE: 0.56 mg/dL (ref 0.44–1.00)
Calcium: 7.9 mg/dL — ABNORMAL LOW (ref 8.9–10.3)
Chloride: 106 mmol/L (ref 101–111)
GFR calc Af Amer: >60 mL/min (ref >60)
Glucose, Bld: 142 mg/dL — ABNORMAL HIGH (ref 65–99)
Potassium: 3.7 mmol/L (ref 3.5–5.1)
SODIUM: 137 mmol/L (ref 135–145)

## 2017-07-17 LAB — HEPATIC FUNCTION PANEL
ALT: 17 U/L (ref 14–54)
AST: 28 U/L (ref 15–41)
Albumin: 2.1 g/dL — ABNORMAL LOW (ref 3.5–5.0)
Alkaline Phosphatase: 191 U/L — ABNORMAL HIGH (ref 38–126)
BILIRUBIN DIRECT: 0.4 mg/dL (ref 0.1–0.5)
BILIRUBIN INDIRECT: 0.7 mg/dL (ref 0.3–0.9)
Total Bilirubin: 1.1 mg/dL (ref 0.3–1.2)
Total Protein: 4.3 g/dL — ABNORMAL LOW (ref 6.5–8.1)

## 2017-07-17 LAB — PROCALCITONIN: Procalcitonin: 0.2 ng/mL

## 2017-07-17 LAB — HEPARIN LEVEL (UNFRACTIONATED)
HEPARIN UNFRACTIONATED: 0.26 [IU]/mL — AB (ref 0.30–0.70)
HEPARIN UNFRACTIONATED: 0.37 [IU]/mL (ref 0.30–0.70)

## 2017-07-17 LAB — AMMONIA: Ammonia: 29 umol/L (ref 9–35)

## 2017-07-17 LAB — HIV ANTIBODY (ROUTINE TESTING W REFLEX): HIV Screen 4th Generation wRfx: NONREACTIVE

## 2017-07-17 LAB — MAGNESIUM: MAGNESIUM: 1.8 mg/dL (ref 1.7–2.4)

## 2017-07-17 MED ORDER — SODIUM CHLORIDE 3 % IN NEBU
4.0000 mL | INHALATION_SOLUTION | Freq: Every day | RESPIRATORY_TRACT | Status: AC
  Administered 2017-07-17 – 2017-07-18 (×2): 4 mL via RESPIRATORY_TRACT
  Filled 2017-07-17 (×3): qty 4

## 2017-07-17 MED ORDER — LEVALBUTEROL HCL 0.63 MG/3ML IN NEBU
0.6300 mg | INHALATION_SOLUTION | Freq: Four times a day (QID) | RESPIRATORY_TRACT | Status: DC
  Administered 2017-07-17 – 2017-07-21 (×14): 0.63 mg via RESPIRATORY_TRACT
  Filled 2017-07-17 (×30): qty 3

## 2017-07-17 MED ORDER — SODIUM CHLORIDE 0.9 % IV BOLUS (SEPSIS)
250.0000 mL | Freq: Once | INTRAVENOUS | Status: AC
  Administered 2017-07-17: 250 mL via INTRAVENOUS

## 2017-07-17 NOTE — Progress Notes (Signed)
ANTICOAGULATION CONSULT NOTE - Follow Up Consult  Pharmacy Consult for Heparin Indication: atrial fibrillation  Allergies  Allergen Reactions  . Acyclovir And Related    Patient Measurements: Height: 5\' 5"  (165.1 cm) Weight: 148 lb 5.9 oz (67.3 kg) IBW/kg (Calculated) : 57 Heparin Dosing Weight: 69 kg  Vital Signs: Temp: 97.4 F (36.3 C) (01/15 0416) Temp Source: Oral (01/15 0416) BP: 115/71 (01/15 0330) Pulse Rate: 110 (01/15 0330)  Labs: Recent Labs    07/14/17 0748  07/15/17 0437 07/15/17 1445 07/16/17 0522 07/17/17 0335  HGB  --   --  10.9*  --  10.4* 9.6*  HCT  --   --  33.1*  --  31.9* 28.7*  PLT  --   --  103*  --  114* 146*  HEPARINUNFRC  --    < > 0.67 0.40 0.41 0.26*  CREATININE 0.60  --  0.57  --  0.56  --    < > = values in this interval not displayed.   Estimated Creatinine Clearance: 57.2 mL/min (by C-G formula based on SCr of 0.56 mg/dL).  Assessment: CC/HPI: leg pain, claudication  PMH: tobacco, HTN, EtOH, anxiety, seizure, embolic CVA   Anticoag: Heparin for new-onset of Afib causing thrombosis, left leg discoloration s/p thrombectomy 1/10, recent CVA on 06/08/17 -s/p L femoral TE in addition to L AKA  -resumed full dose 1/12 am per dr Donzetta Matters -heparin level subtherapeutic: 0.26, no infusion issues or bleeding reported  Renal: SCr 0.56  Heme/Onc: H&H 9.6/28.7, Plt 146  Goal of Therapy:  Heparin level 0.3-0.7 units/ml Monitor platelets by anticoagulation protocol: Yes   Plan:  Increase heparin gtt to 1000 units/hr 8 hour heparin level Daily heparin level, CBC F/U long-term AC plans  Georga Bora, PharmD Clinical Pharmacist 07/17/2017 4:30 AM

## 2017-07-17 NOTE — Progress Notes (Addendum)
[place] TEAM 1 - Stepdown/ICU TEAM  BRELYN WOEHL  OEU:[id number] DOB: 1946/04/02 DOA: 07/12/2017 PCP: [name], MD    Brief Narrative:  72yo F w/ a Hx of Alcohol abuse, Anxiety, CVA, Seizures, HTN, dementia, and Osteoporosis who presented w/ left foot pain.    Significant Events: 1/10 admit w/ L leg pain - taken to OR emergently w/ extensive thrombectomy - L IJ CVL 1/11 L AKA and L common femoral thrombectomy   Subjective: The patient is alert and interactive but mildly confused.  She can tell me where she is but not the year and she does not seem to recall the circumstances that brought her into the hospital.  She tells me she "feels like shit" but cannot be more specific in regard to complaints.  She is currently requiring significant oxygen support via high flow nasal cannula.  She does not appear to be in distress but her systolic blood pressure is marginal and her atrial fibrillation has been leading to some tachycardia this morning.  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 as per VVS - appears to be stable from this standpoint   L Effusion +/- Pneumonia - acute hypoxic respiratory failure Desaturations yesterday + ?infiltrates on CXR + climbing temp w/ mildly elevated WBC - empiric HCAP coverage initiated - I asked PCCM to evaluate for a possible thoracentesis    Persistent atrial fibrillation Currently requiring Cardizem drip - Cardiology managing - cont heparin gtt and plan for transition to [place] prior to d/c   Altered mental status patient sensitive to narcotics - avoid sedatives as able - suspect an element of withdrawal is at play, but also at risk for baseline cognitive impairment given reported EtOH abuse   Chronic grade 2 diastolic CHF (per TTE Dec 2018) No evidence of signif volume overload at this time - follow weights Filed Weights   07/12/17 1643 07/13/17 1741 07/14/17 0545  Weight: 69.1 kg (152 lb 5.4 oz) 69.1 kg (152 lb 5.4 oz) 67.3 kg  (148 lb 5.9 oz)    Hypotension  Recurring - gently hydrate in setting of very limited intake   Hypomagnesemia Replaced  RUL nodular densities CT angio noted spiculated mass 2 - PET recommended as outpt - will ask for cytology if thora accomplished   Tobacco dependence Continue nicotine patch  Hx CVA   Thrombocytopenia (baseline~180) Likely due to EtOH abuse - appears to be slowly improving in absence of EtOH  EtOH abuse CIWA protocol in place   Moderate malnutrition in context of acute illness/injury  DVT prophylaxis: heparin gtt  Code Status: FULL CODE Family Communication: no family present at time of exam  Disposition Plan: SDU   Consultants:  VVS PCCM Cardiology   Antimicrobials:  none  Objective: Blood pressure (!) 87/75, pulse (!) 143, temperature 98.2 F (36.8 C), temperature source Oral, resp. rate (!) 24, height 5\' 5"  (1.651 m), weight 67.3 kg (148 lb 5.9 oz), SpO2 97 %.  Intake/Output Summary (Last 24 hours) at 07/17/2017 0916 Last data filed at 07/17/2017 0749 Gross per 24 hour  Intake 809.85 ml  Output 1330 ml  Net -520.15 ml   Filed Weights   07/12/17 1643 07/13/17 1741 07/14/17 0545  Weight: 69.1 kg (152 lb 5.4 oz) 69.1 kg (152 lb 5.4 oz) 67.3 kg (148 lb 5.9 oz)    Examination: General: requiring 10L HFNC O2 though not in distress on exam  Lungs: minimal to no air movement in the L  base - no wheezing - CTA th/o other fields  Cardiovascular: irreg irreg - no rub or M appreciated  Abdomen: NT/ND, soft, bs+, no mass or rebound  Extremities: L AKA site dressed and dry - no edema R LE   CBC: Recent Labs  Lab 07/12/17 1545  07/13/17 2326 07/14/17 0546 07/15/17 0437 07/16/17 0522 07/17/17 0335  WBC 13.6*   < > 12.4* 14.3* 11.1* 10.6* 13.3*  NEUTROABS 10.4*  --   --   --   --   --   --   HGB 16.1*   < > 8.7* 12.3 10.9* 10.4* 9.6*  HCT 48.4*   < > 26.4* 37.1 33.1* 31.9* 28.7*  MCV 92.0   < > 93.6 91.2 91.7 92.2 92.0  PLT 176   < > 119*  132* 103* 114* 146*   < > = values in this interval not displayed.   Basic Metabolic Panel: Recent Labs  Lab 07/12/17 2230 07/13/17 0434 07/13/17 2326 07/14/17 0748 07/15/17 0437 07/16/17 0522 07/17/17 0335  NA  --  136 138 137 139 137 137  K  --  4.0 3.8 3.7 3.8 3.6 3.7  CL  --  107 113* 111 110 107 106  CO2  --  17* 20* 18* 22 22 23   GLUCOSE  --  178* 116* 143* 114* 107* 142*  BUN  --  27* 20 17 14 11 13   CREATININE  --  0.78 0.65 0.60 0.57 0.56 0.56  CALCIUM  --  8.7* 7.7* 8.1* 7.8* 8.0* 7.9*  MG 1.6* 2.1  --  1.5* 1.8 1.5* 1.8  PHOS 4.0 3.9  --  2.1* 3.0  --   --    GFR: Estimated Creatinine Clearance: 57.2 mL/min (by C-G formula based on SCr of 0.56 mg/dL).  Liver Function Tests: Recent Labs  Lab 07/13/17 0434 07/17/17 0335  AST 43* 28  ALT 20 17  ALKPHOS 100 191*  BILITOT 1.2 1.1  PROT 6.0* 4.3*  ALBUMIN 3.4* 2.1*    Coagulation Profile: Recent Labs  Lab 07/13/17 0434  INR 1.18    Cardiac Enzymes: Recent Labs  Lab 07/13/17 0434  CKTOTAL 2,279*    HbA1C: Hgb A1c MFr Bld  Date/Time Value Ref Range Status  06/09/2017 05:13 AM 5.6 4.8 - 5.6 % Final    Comment:    (NOTE) Pre diabetes:          5.7%-6.4% Diabetes:              >6.4% Glycemic control for   <7.0% adults with diabetes     CBG: Recent Labs  Lab 07/12/17 2054 07/14/17 2020 07/14/17 2348 07/15/17 0408  GLUCAP 121* 134* 114* 105*    Recent Results (from the past 240 hour(s))  MRSA PCR Screening     Status: None   Collection Time: 07/12/17 11:20 PM  Result Value Ref Range Status   MRSA by PCR NEGATIVE NEGATIVE Final    Comment:        The GeneXpert MRSA Assay (FDA approved for NASAL specimens only), is one component of a comprehensive MRSA colonization surveillance program. It is not intended to diagnose MRSA infection nor to guide or monitor treatment for MRSA infections.   Surgical pcr screen     Status: None   Collection Time: 07/13/17 11:15 AM  Result Value  Ref Range Status   MRSA, PCR NEGATIVE NEGATIVE Final   Staphylococcus aureus NEGATIVE NEGATIVE Final    Comment: (NOTE) The Xpert SA Assay (  FDA approved for NASAL specimens in patients 18 years of age and older), is one component of a comprehensive surveillance program. It is not intended to diagnose infection nor to guide or monitor treatment.      Scheduled Meds: . atorvastatin  20 mg Oral Daily  . chlorhexidine  15 mL Mouth Rinse BID  . Chlorhexidine Gluconate Cloth  6 each Topical Daily  . feeding supplement (ENSURE ENLIVE)  237 mL Oral TID BM  . folic acid  1 mg Oral Daily  . levETIRAcetam  500 mg Oral BID  . magnesium oxide  400 mg Oral BID  . mouth rinse  15 mL Mouth Rinse q12n4p  . multivitamin with minerals  1 tablet Oral Daily  . nicotine  21 mg Transdermal Daily  . sodium chloride flush  10-40 mL Intracatheter Q12H  . thiamine  100 mg Oral Daily   Continuous Infusions: . sodium chloride 10 mL/hr at 07/17/17 0749  . ceFEPime (MAXIPIME) IV 1 g (07/17/17 0600)  . diltiazem (CARDIZEM) infusion 12.5 mg/hr (07/17/17 0749)  . heparin 1,000 Units/hr (07/17/17 0749)  . vancomycin Stopped (07/16/17 2250)     LOS: 5 days   Cherene Altes, MD [facility] Office  [phone] Pager - Text Page per Amion as per below:  On-Call/Text Page:      [email or web address]      password [email or web address]  If 7PM-7AM, please contact night-coverage [email or web address] Password [email or web address] 07/17/2017, 9:16 AM

## 2017-07-17 NOTE — Progress Notes (Signed)
Progress Note  Patient Name: Shelley [name] Date of Encounter: 07/17/2017  Primary Cardiologist: Cristopher Peru, MD   Subjective   Confused at baseline. Getting washed now.   Inpatient Medications    Scheduled Meds: . atorvastatin  20 mg Oral Daily  . chlorhexidine  15 mL Mouth Rinse BID  . Chlorhexidine Gluconate Cloth  6 each Topical Daily  . feeding supplement (ENSURE ENLIVE)  237 mL Oral TID BM  . folic acid  1 mg Oral Daily  . levETIRAcetam  500 mg Oral BID  . magnesium oxide  400 mg Oral BID  . mouth rinse  15 mL Mouth Rinse q12n4p  . multivitamin with minerals  1 tablet Oral Daily  . nicotine  21 mg Transdermal Daily  . sodium chloride flush  10-40 mL Intracatheter Q12H  . thiamine  100 mg Oral Daily   Continuous Infusions: . sodium chloride 10 mL/hr at 07/17/17 0749  . ceFEPime (MAXIPIME) IV 1 g (07/17/17 0600)  . diltiazem (CARDIZEM) infusion 12.5 mg/hr (07/17/17 0749)  . heparin 1,000 Units/hr (07/17/17 0749)  . vancomycin Stopped (07/16/17 2250)   PRN Meds: alum & mag hydroxide-simeth, metoprolol tartrate, morphine injection, ondansetron, oxyCODONE, polyethylene glycol, sodium chloride flush   Vital Signs    Vitals:   07/17/17 0630 07/17/17 0700 07/17/17 0751 07/17/17 0800  BP: 106/75 (!) 195/156  (!) 87/75  Pulse: (!) 137 (!) 141  (!) 143  Resp: 19 20  (!) 24  Temp:   98.2 F (36.8 C)   TempSrc:   Oral   SpO2: 98% 93%  97%  Weight:      Height:        Intake/Output Summary (Last 24 hours) at 07/17/2017 0943 Last data filed at 07/17/2017 0749 Gross per 24 hour  Intake 809.85 ml  Output 1330 ml  Net -520.15 ml   Filed Weights   07/12/17 1643 07/13/17 1741 07/14/17 0545  Weight: 152 lb 5.4 oz (69.1 kg) 152 lb 5.4 oz (69.1 kg) 148 lb 5.9 oz (67.3 kg)    Telemetry    Atrial fibrillation heart rate under better control.  Currently 90's.- Personally Reviewed  ECG    Atrial fibrillation with rapid ventricular response- Personally  Reviewed  Physical Exam   GEN: Well nourished, well developed, in no acute distress  HEENT: normal  Neck: no JVD, carotid bruits, or masses Cardiac: irreg ; no murmurs, rubs, or gallops,no edema  Respiratory:  clear to auscultation bilaterally, normal work of breathing GI: soft, nontender, nondistended, + BS MS: Left AKA Skin: warm and dry, no rash Neuro:  Alert and Oriented x 3, Strength and sensation are intact Psych: euthymic mood, full affect   Labs    Chemistry Recent Labs  Lab 07/13/17 0434  07/15/17 0437 07/16/17 0522 07/17/17 0335  NA 136   < > 139 137 137  K 4.0   < > 3.8 3.6 3.7  CL 107   < > 110 107 106  CO2 17*   < > 22 22 23   GLUCOSE 178*   < > 114* 107* 142*  BUN 27*   < > 14 11 13   CREATININE 0.78   < > 0.57 0.56 0.56  CALCIUM 8.7*   < > 7.8* 8.0* 7.9*  PROT 6.0*  --   --   --  4.3*  ALBUMIN 3.4*  --   --   --  2.1*  AST 43*  --   --   --  28  ALT 20  --   --   --  17  ALKPHOS 100  --   --   --  191*  BILITOT 1.2  --   --   --  1.1  GFRNONAA >60   < > >60 >60 >60  GFRAA >60   < > >60 >60 >60  ANIONGAP 12   < > 7 8 8    < > = values in this interval not displayed.     Hematology Recent Labs  Lab 07/15/17 0437 07/16/17 0522 07/17/17 0335  WBC 11.1* 10.6* 13.3*  RBC 3.61* 3.46* 3.12*  HGB 10.9* 10.4* 9.6*  HCT 33.1* 31.9* 28.7*  MCV 91.7 92.2 92.0  MCH 30.2 30.1 30.8  MCHC 32.9 32.6 33.4  RDW 14.7 14.6 14.5  PLT 103* 114* 146*    Cardiac EnzymesNo results for input(s): TROPONINI in the last 168 hours.  Recent Labs  Lab 07/12/17 1552  TROPIPOC 0.03     BNPNo results for input(s): BNP, PROBNP in the last 168 hours.   DDimer No results for input(s): DDIMER in the last 168 hours.   Radiology    Dg Chest Port 1 View  Result Date: 07/16/2017 CLINICAL DATA:  Hypoxia EXAM: PORTABLE CHEST 1 VIEW COMPARISON:  07/12/2017 FINDINGS: Moderate left pleural effusion with left lower lobe atelectasis or infiltrate. Appearance is concerning for  pneumonia. Small right pleural effusions suspected. No confluent opacity on the right. Heart is borderline in size. Left central line is unchanged. IMPRESSION: Moderate left effusion with left lower lobe airspace opacity concerning for pneumonia. Small right effusion. Electronically Signed   By: Rolm Baptise M.D.   On: 07/16/2017 10:53    Cardiac Studies   Echocardiogram-normal LV function, trace AI, mildly dilated aortic root, mild to moderate tricuspid regurgitation.  Patient Profile     72 y.o. female recent stroke, embolic event to left lower extremity requiring amputation.  Atrial fibrillation.  Assessment & Plan    Persistent atrial fibrillation - Diltiazem drip currently at 12.5.  Maintaining adequate rate control, 90 currently on tele.  Single injection of 0.25 mg of digoxin administered on 07/15/17 at 1:24 PM..  Requirements for lifelong anticoagulation, transition to Eliquis at discharge.  Currently on IV heparin. Recent surgery and lung event driving heart rate.   Embolic event lower extremity requiring amputation -Vascular surgery, no change  Essential hypertension -Previous pressor use.  Off. Getting IVF today. Soft.   Acute hypoxic respiratory failure/ ?PNA -High flow oxygen.  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 Pulmonary team is seeing.   Tobacco use -Continue to encourage cessation.  Alcohol use -Protocol in place, CIWA  Discussed with Dr. Thereasa Solo  For questions or updates, please contact [place] Please consult [email or web address] for contact info under Cardiology/STEMI.      Signed, Candee Furbish, MD  07/17/2017, 9:43 AM

## 2017-07-17 NOTE — Evaluation (Signed)
Physical Therapy Evaluation Patient Details Name: Shelley [name] MRN: [id number] DOB: Oct 18, 1945 Today's Date: 07/17/2017   History of Present Illness  Shelley [name] is a 72 y.o. female with PMH of Alcohol abuse, Anxiety, CVA (cerebral infarction), Hypertension, Osteoporosis, and Seizures and recent admission 06/08/17 for CVA. She presented to [facility] ED 07/12/17 with left foot pain, and was found to have thrombosis and underwent thrombectomy then L AKA on 07/13/17.  Postoperatively had had respiratory failure requiring hi-flow O2 as well as A-fib w/ RVR. and post-operative anemia and hypotension.    Clinical Impression  Patient presents with decreased independence with mobility due to pain, limited activity tolerance, decreased balance, decreased strength and limited motivation.  She will benefit from skilled PT in the acute setting and follow up SNF level rehab at d/c.  Currently requires +2 A for EOB activity, but was ambulatory at home with walker and assist.      Follow Up Recommendations SNF;Supervision/Assistance - 24 hour    Equipment Recommendations  Wheelchair (measurements PT);Wheelchair cushion (measurements PT)    Recommendations for Other Services       Precautions / Restrictions Precautions Precautions: Fall Precaution Comments: L AKA Restrictions Weight Bearing Restrictions: Yes LLE Weight Bearing: Non weight bearing      Mobility  Bed Mobility Overal bed mobility: Needs Assistance Bed Mobility: Supine to Sit;Sit to Supine     Supine to sit: Max assist;+2 for physical assistance;HOB elevated Sit to supine: Max assist;+2 for physical assistance   General bed mobility comments: Patient able to reach for rail, but assist to scoot and lift trunk upright; to supine assist to scoot hips back in bed and lift both legs into bed, pt managed trunk until supine, assist to reposition shoulders  Transfers                 General transfer comment: unable due to pain, soft  BP's  Ambulation/Gait                Stairs            Wheelchair Mobility    Modified Rankin (Stroke Patients Only)       Balance Overall balance assessment: Needs assistance Sitting-balance support: Feet unsupported;Bilateral upper extremity supported Sitting balance-Leahy Scale: Poor Sitting balance - Comments: UE support for balance; sat EOB about 10 minutes                                     Pertinent Vitals/Pain Pain Assessment: Faces Faces Pain Scale: Hurts whole lot Pain Location: L LE with movement Pain Descriptors / Indicators: Discomfort;Crying;Operative site guarding Pain Intervention(s): Monitored during session;Limited activity within patient's tolerance;Repositioned    Home Living Family/patient expects to be discharged to:: Skilled nursing facility Living Arrangements: Spouse/significant other Available Help at Discharge: Family;Available 24 hours/day Type of Home: House Home Access: Stairs to enter Entrance Stairs-Rails: Can reach both Entrance Stairs-Number of Steps: 4-5 Home Layout: One level        Prior Function Level of Independence: Needs assistance         Comments: spouse reports since d/c from rehab pt has declined and needed assist with all ADL's as well as to initiate any mobility even to bathroom; reports she sat on couch for 72 hours.      Hand Dominance        Extremity/Trunk Assessment   Upper Extremity Assessment Upper Extremity Assessment:  Defer to OT evaluation    Lower Extremity Assessment Lower Extremity Assessment: RLE deficits/detail;LLE deficits/detail RLE Deficits / Details: AAROM grossly WFL, difficulty moving on her own due to increases pain on L LE LLE Deficits / Details: L AKA limited movement due to pain, noted edema and dry dressing on wound    Cervical / Trunk Assessment Cervical / Trunk Assessment: Kyphotic  Communication   Communication: No difficulties  Cognition  Arousal/Alertness: Awake/alert Behavior During Therapy: Flat affect Overall Cognitive Status: Impaired/Different from baseline Area of Impairment: Orientation;Memory;Attention;Safety/judgement                 Orientation Level: Situation;Time;Place Current Attention Level: Sustained Memory: Decreased short-term memory   Safety/Judgement: Decreased awareness of safety;Decreased awareness of deficits            General Comments General comments (skin integrity, edema, etc.): spouse in room and gives history as pt confused; assisting to help pt eat lunch with OT instructions at end of session    Exercises     Assessment/Plan    PT Assessment Patient needs continued PT services  PT Problem List Decreased strength;Decreased knowledge of use of DME;Decreased activity tolerance;Decreased safety awareness;Decreased balance;Decreased mobility;Pain;Decreased cognition       PT Treatment Interventions DME instruction;Balance training;Functional mobility training;Patient/family education;Therapeutic activities;Wheelchair mobility training;Therapeutic exercise    PT Goals (Current goals can be found in the Care Plan section)  Acute Rehab PT Goals Patient Stated Goal: To get stronger PT Goal Formulation: With family Time For Goal Achievement: 07/31/17 Potential to Achieve Goals: Fair    Frequency Min 3X/week   Barriers to discharge        Co-evaluation PT/OT/SLP Co-Evaluation/Treatment: Yes Reason for Co-Treatment: To address functional/ADL transfers;For patient/therapist safety;Complexity of the patient's impairments (multi-system involvement) PT goals addressed during session: Mobility/safety with mobility;Balance         AM-PAC PT "6 Clicks" Daily Activity  Outcome Measure Difficulty turning over in bed (including adjusting bedclothes, sheets and blankets)?: Unable Difficulty moving from lying on back to sitting on the side of the bed? : Unable Difficulty sitting  down on and standing up from a chair with arms (e.g., wheelchair, bedside commode, etc,.)?: Unable Help needed moving to and from a bed to chair (including a wheelchair)?: Total Help needed walking in hospital room?: Total Help needed climbing 3-5 steps with a railing? : Total 6 Click Score: 6    End of Session Equipment Utilized During Treatment: Oxygen Activity Tolerance: Patient limited by fatigue;Patient limited by pain Patient left: in bed;with call bell/phone within reach;with family/visitor present Nurse Communication: Mobility status PT Visit Diagnosis: Muscle weakness (generalized) (M62.81);Difficulty in walking, not elsewhere classified (R26.2);Pain Pain - Right/Left: Left Pain - part of body: Leg    Time: 1329-1400 PT Time Calculation (min) (ACUTE ONLY): 31 min   Charges:   PT Evaluation $PT Eval Moderate Complexity: 1 Mod     PT G CodesMagda Kiel, [place] [phone] 07/17/2017   Reginia Naas 07/17/2017, 4:38 PM

## 2017-07-17 NOTE — Progress Notes (Signed)
Occupational therapy Evaluation  PTA, pt was living at home with husband and had recently participated with rehab at [facility]. Husband states pt has required assistance since DC form hospital and that she "sat on the couch for 72 hrs" before he could get her to get off the couch and go to the bathroom. Husband reports pt's appetite has been poor. Pt currently requires Max A +2 with bed mobility and Max to total A with LB ADL. Pt will benefit form rehab at SNF to maximize functional level of independence. Will follow acutely to address established goals   07/17/17 1634  OT Visit Information  Last OT Received On 07/17/17  Assistance Needed +2  PT/OT/SLP Co-Evaluation/Treatment Yes  Reason for Co-Treatment Complexity of the patient's impairments (multi-system involvement);For patient/therapist safety;To address functional/ADL transfers  OT goals addressed during session ADL's and self-care  History of Present Illness Shelley [name] is a 72 y.o. female with PMH of Alcohol abuse, Anxiety, CVA (cerebral infarction), Hypertension, Osteoporosis, and Seizures and recent admission 06/08/17 for CVA. 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  Postoperatively had had respiratory failure requiring hi-flow O2 as well as A-fib w/ RVR. and post-operative anemia and hypotension.    Precautions  Precautions Fall  Precaution Comments L AKA  Restrictions  Weight Bearing Restrictions Yes  LLE Weight Bearing NWB  Home Living  Family/patient expects to be discharged to: [place] other  Available Help at Discharge Family;Available 24 hours/day  Type of Home House  Home Access Stairs to enter  Entrance Stairs-Number of Steps 4-5  Entrance Stairs-Rails Can reach both  Home Layout One level  Lives With Spouse;Family  Prior Function  Level of Independence Needs assistance   Comments spouse reports since d/c from rehab pt has declined and needed assist with all ADL's as well as to initiate any mobility even to bathroom; reports she sat on couch for 72 hours.   Communication  Communication No difficulties  Pain Assessment  Pain Assessment Faces  Faces Pain Scale 8  Pain Location L LE with movement  Pain Descriptors / Indicators Discomfort;Crying;Operative site guarding  Pain Intervention(s) Limited activity within patient's tolerance  Cognition  Arousal/Alertness Awake/alert  Behavior During Therapy Flat affect  Overall Cognitive Status Impaired/Different from baseline  Area of Impairment Orientation;Memory;Attention;Safety/judgement;Awareness;Problem solving  Orientation Level Situation;Time;Place  Current Attention Level Sustained  Memory Decreased short-term memory  Safety/Judgement Decreased awareness of safety;Decreased awareness of deficits  Awareness Intellectual  Problem Solving Slow processing;Decreased initiation;Difficulty sequencing;Requires verbal cues;Requires tactile cues  Upper Extremity Assessment  Upper Extremity Assessment Generalized weakness  Lower Extremity Assessment  Lower Extremity Assessment Defer to PT evaluation  RLE Deficits / Details AAROM grossly WFL, difficulty moving on her own due to increases pain on L LE  LLE Deficits / Details L AKA limited movement due to pain, noted edema and dry dressing on wound  Cervical / Trunk Assessment  Cervical / Trunk Assessment Kyphotic  ADL  Overall ADL's  Needs assistance/impaired  Eating/Feeding Minimal assistance;Bed level  Grooming Minimal assistance;Sitting;Bed level  Upper Body Bathing Minimal assistance;Bed level  Lower Body Bathing Total assistance;Bed level  Upper Body Dressing  Maximal assistance;Sitting  Lower Body Dressing Total assistance;Bed level  Functional mobility during ADLs Maximal assistance;+2 for physical assistance (bed mobility level)  Vision- History   Baseline Vision/History Wears glasses  Wears Glasses At all times  Patient  Visual Report No change from baseline  Vision- Assessment  Additional Comments vision deficits from CVA - will further assess  Bed Mobility  Overal bed mobility Needs Assistance  Bed Mobility Supine to Sit;Sit to Supine  Supine to sit Max assist;+2 for physical assistance;HOB elevated  Sit to supine Max assist;+2 for physical assistance  General bed mobility comments Patient able to reach for rail, but assist to scoot and lift trunk upright; to supine assist to scoot hips back in bed and lift both legs into bed, pt managed trunk until supine, assist to reposition shoulders  Transfers  General transfer comment unable due to pain, soft BP's  Balance  Overall balance assessment Needs assistance  Sitting-balance support Feet unsupported;Bilateral upper extremity supported  Sitting balance-Leahy Scale Poor  Sitting balance - Comments UE support for balance; sat EOB about 10 minutes  OT - End of Session  Equipment Utilized During Treatment Oxygen  Activity Tolerance Patient limited by pain;Patient limited by fatigue  Patient left in bed;with call bell/phone within reach;with bed alarm set;with family/visitor present  Nurse Communication Mobility status  OT Assessment  OT Recommendation/Assessment Patient needs continued OT Services  OT Visit Diagnosis Other abnormalities of gait and mobility (R26.89);Muscle weakness (generalized) (M62.81);Other symptoms and signs involving cognitive function;Pain  Pain - Right/Left Left  Pain - part of body Leg  OT Problem List Decreased strength;Decreased range of motion;Decreased activity tolerance;Impaired balance (sitting and/or standing);Decreased cognition;Decreased safety awareness;Decreased knowledge of use of DME or AE;Decreased knowledge of precautions;Cardiopulmonary status limiting activity;Obesity;Pain  OT Plan  OT Frequency (ACUTE ONLY) Min 2X/week  OT  Treatment/Interventions (ACUTE ONLY) Self-care/ADL training;Therapeutic exercise;Energy conservation;DME and/or AE instruction;Therapeutic activities;Cognitive remediation/compensation;Patient/family education;Balance training  AM-PAC OT "6 Clicks" Daily Activity Outcome Measure  Help from another person eating meals? 3  Help from another person taking care of personal grooming? 3  Help from another person toileting, which includes using toliet, bedpan, or urinal? 1  Help from another person bathing (including washing, rinsing, drying)? 2  Help from another person to put on and taking off regular upper body clothing? 2  Help from another person to put on and taking off regular lower body clothing? 1  6 Click Score 12  ADL G Code Conversion CL  OT Recommendation  Follow Up Recommendations SNF;Supervision/Assistance - 24 hour  OT Equipment Other (comment) (TBA)  Individuals Consulted  Consulted and Agree with Results and Recommendations Patient;Family member/caregiver  Family Member Consulted husband  Acute Rehab OT Goals  Patient Stated Goal To get stronger  OT Goal Formulation With patient/family  Time For Goal Achievement 07/31/17  Potential to Achieve Goals Fair  OT Time Calculation  OT Start Time (ACUTE ONLY) 1329  OT Stop Time (ACUTE ONLY) 1402  OT Time Calculation (min) 33 min  OT General Charges  $OT Visit 1 Visit  OT Evaluation  $OT Eval Moderate Complexity 1 Mod  Written Expression  Dominant Hand Right  [facility], OT/L  [phone] 07/17/2017

## 2017-07-17 NOTE — Progress Notes (Signed)
ANTICOAGULATION CONSULT NOTE - Follow Up Consult  Pharmacy Consult for Heparin Indication: atrial fibrillation  Allergies  Allergen Reactions  . Acyclovir And Related    Patient Measurements: Height: 5\' 5"  (165.1 cm) Weight: 148 lb 5.9 oz (67.3 kg) IBW/kg (Calculated) : 57 Heparin Dosing Weight: 69 kg  Vital Signs: Temp: 98 F (36.7 C) (01/15 1140) Temp Source: Oral (01/15 1140) BP: 87/75 (01/15 0800) Pulse Rate: 143 (01/15 0800)  Labs: Recent Labs    07/15/17 0437  07/16/17 0522 07/17/17 0335 07/17/17 1121  HGB 10.9*  --  10.4* 9.6*  --   HCT 33.1*  --  31.9* 28.7*  --   PLT 103*  --  114* 146*  --   HEPARINUNFRC 0.67   < > 0.41 0.26* 0.37  CREATININE 0.57  --  0.56 0.56  --    < > = values in this interval not displayed.   Estimated Creatinine Clearance: 57.2 mL/min (by C-G formula based on SCr of 0.56 mg/dL).  Assessment: CC/HPI: leg pain, claudication  PMH: tobacco, HTN, EtOH, anxiety, seizure, embolic CVA   Anticoag: Heparin for new-onset of Afib causing thrombosis, left leg discoloration s/p thrombectomy 1/10, recent CVA on 06/08/17 -s/p L femoral TE in addition to L AKA  -resumed full dose 1/12 am per dr Donzetta Matters -heparin level therapeutic: 0.37, no infusion issues or bleeding reported  Renal: SCr 0.56  Heme/Onc: H&H 9.6/28.7, Plt 146  Goal of Therapy:  Heparin level 0.3-0.7 units/ml Monitor platelets by anticoagulation protocol: Yes   Plan:  Continue heparin gtt at 1000 units/hr Daily heparin level, CBC F/U long-term AC plans  Albertina Parr, PharmD., BCPS Clinical Pharmacist Pager [phone]

## 2017-07-17 NOTE — Progress Notes (Signed)
PULMONARY / CRITICAL CARE MEDICINE   Name: Shelley [name] MRN: [id number] DOB: [birth year]    ADMISSION DATE:  07/12/2017 CONSULTATION DATE:  07/12/2017  REFERRING MD:  Dr Bridgett Larsson  CHIEF COMPLAINT:  Left foot pain  SUBJECTIVE:  Still on high flow oxygen  VITAL SIGNS: BP (!) 87/75   Pulse (!) 143   Temp 98.2 F (36.8 C) (Oral)   Resp (!) 24   Ht 5\' 5"  (1.651 m)   Wt 148 lb 5.9 oz (67.3 kg)   SpO2 97%   BMI 24.69 kg/m  High flow oxygen   hEMODYNAMICS:    VENTILATOR SETTINGS:    INTAKE / OUTPUT: I/O last 3 completed shifts: In: 1004.4 [I.V.:744.4; IV Piggyback:260] Out: 1620 [Urine:1620]  PHYSICAL EXAMINATION: General: This is a 72 year old frail female she is resting comfortably in the bed.  She is alert without distress HEENT: Normocephalic atraumatic no jugular venous distention. Pulmonary: Remarkably diminished on the left side no accessory muscle use. Cardiac: Irregular irregular Abdomen: Soft nontender no organomegaly positive bowel sounds Extremities: Status post left AKA  Pain is tolerable Neuro: Weak, moves extremities, oriented x2  LABS:  BMET Recent Labs  Lab 07/15/17 0437 07/16/17 0522 07/17/17 0335  NA 139 137 137  K 3.8 3.6 3.7  CL 110 107 106  CO2 22 22 23   BUN 14 11 13   CREATININE 0.57 0.56 0.56  GLUCOSE 114* 107* 142*   Electrolytes Recent Labs  Lab 07/13/17 0434  07/14/17 0748 07/15/17 0437 07/16/17 0522 07/17/17 0335  CALCIUM 8.7*   < > 8.1* 7.8* 8.0* 7.9*  MG 2.1  --  1.5* 1.8 1.5* 1.8  PHOS 3.9  --  2.1* 3.0  --   --    < > = values in this interval not displayed.   CBC Recent Labs  Lab 07/15/17 0437 07/16/17 0522 07/17/17 0335  WBC 11.1* 10.6* 13.3*  HGB 10.9* 10.4* 9.6*  HCT 33.1* 31.9* 28.7*  PLT 103* 114* 146*   Coag's Recent Labs  Lab 07/13/17 0434  INR 1.18   Sepsis Markers Recent Labs  Lab 07/12/17 2328 07/13/17 0434 07/13/17 2326  LATICACIDVEN 0.9 1.0 1.1   ABG Recent Labs  Lab 07/12/17 1711  07/12/17 1824  PHART 7.404 7.374  PCO2ART 24.8* 30.5*  PO2ART 76.0* 184.0*   Liver Enzymes Recent Labs  Lab 07/13/17 0434 07/17/17 0335  AST 43* 28  ALT 20 17  ALKPHOS 100 191*  BILITOT 1.2 1.1  ALBUMIN 3.4* 2.1*   Cardiac Enzymes No results for input(s): TROPONINI, PROBNP in the last 168 hours.  Glucose Recent Labs  Lab 07/12/17 2054 07/14/17 2020 07/14/17 2348 07/15/17 0408  GLUCAP 121* 134* 114* 105*   Imaging Dg Chest Port 1 View  Result Date: 07/16/2017 CLINICAL DATA:  Hypoxia EXAM: PORTABLE CHEST 1 VIEW COMPARISON:  07/12/2017 FINDINGS: Moderate left pleural effusion with left lower lobe atelectasis or infiltrate. Appearance is concerning for pneumonia. Small right pleural effusions suspected. No confluent opacity on the right. Heart is borderline in size. Left central line is unchanged. IMPRESSION: Moderate left effusion with left lower lobe airspace opacity concerning for pneumonia. Small right effusion. Electronically Signed   By: Rolm Baptise M.D.   On: 07/16/2017 10:53   STUDIES:  CXR 1/10 > RUL nodular density.  CULTURES: None.  ANTIBIOTICS: Cefepime 1/14 Vancomycin 1/14  SIGNIFICANT EVENTS: 1/10 > admitted with left leg pain, likely ischemic from embolic phenomenon.  Taken to OR.  LINES/TUBES: L IJ CVL 1/10 >  DISCUSSION: 72 y.o. female admitted 1/10 with left leg pain.  Felt to have ischemia; therefore, taken to OR for attempts at thrombectomy.  In OR, found to have dead muscle in all LLE compartments. Per family, vascular planning on amputation in AM 1/11.  ASSESSMENT / PLAN:  Circulatory shock (resolved) Fibrillation with rapid ventricular response Hypertension Fluid and electrolyte imbalance  history of CVA Status post left AKA Acute encephalopathy  grade 2 diastolic dysfunction     Pulmonary issues Acute Hypoxic Respiratory failure With ongoing high FiO2 needs. Progressive left-sided airspace disease most consistent with  pneumonia/atelectasis -Cannot exclude aspiration event/hcap -Portable chest x-ray was personally reviewed this demonstrates progressive left-sided airspace disease.  -Bedside ultrasound sound review this demonstrated very little pleural effusion certainly not enough to explain the degree of hypoxia and not enough volume to warrant thoracentesis Plan Continue high flow oxygen Aggressive pulmonary hygiene In regards to possible pneumonia: Now on day #3 cefepime and vancomycin this would be appropriate for Coverage Minimize sedation  RUL nodular density. Plan Will need a CT of the chest once more hemodynamically stable   Tobacco dependence Plan Nicotine patch   Erick Colace ACNP-BC [place] Pager # [phone] OR # [phone] if no answer  Attending Note:  72 year old female with hypoxemic respiratory failure.  PCCM recalled for pleural effusion.  On exam, decreased BS on the left.  I reviewed CXR myself, opacification on the left noted.  Discussed with PCCM-NP and TRH-MD.  Pleural effusion: not much seen under U/S.  - No thora  - Diureses as ordered  Opacification on the left, likely atelectasis.  Atelectasis:  - IS per RT protocol  - Reposition  - Ambulate  Hypoxemia:  - Titrate O2 for sat of 88-92%  - May need ambulatory desat study prior to discharge for home O2  RUL Nodule:  - CT once more stable  Tobacco abuse:  - Smoking cessation  PCCM will f/u.  Patient seen and examined, agree with above note.  I dictated the care and orders written for this patient under my direction.  Rush Farmer, MD [phone]  07/17/2017, 10:36 AM

## 2017-07-17 NOTE — Progress Notes (Signed)
   Daily Progress Note   Assessment/Planning:   POD #4 s/p L AKA, femoral TE   Continued delirium: ?EtOH withdrawal  L AKA viable with some hematoma, drainage subsequent expected: dry dressing BID and prn  Dilt gtt / Heparin gtt per Cardiology  Will continue to follow  Subjective  - 4 Days Post-Op   confused   Objective   Vitals:   07/17/17 0500 07/17/17 0530 07/17/17 0600 07/17/17 0630  BP: 104/79 105/77 110/84 106/75  Pulse: (!) 138 (!) 141 (!) 141 (!) 137  Resp: 19 18 18 19   Temp:      TempSrc:      SpO2: 99% 100% 99% 98%  Weight:      Height:         Intake/Output Summary (Last 24 hours) at 07/17/2017 0743 Last data filed at 07/17/2017 0600 Gross per 24 hour  Intake 534 ml  Output 1005 ml  Net -471 ml    PULM  CTAB  CV  RRR  GI  soft, NTND  VASC L AKA: bandaged, no active bleeding, L groin inc c/d/i  NEURO confused    Laboratory   CBC CBC Latest Ref Rng & Units 07/17/2017 07/16/2017 07/15/2017  WBC 4.0 - 10.5 K/uL 13.3(H) 10.6(H) 11.1(H)  Hemoglobin 12.0 - 15.0 g/dL 9.6(L) 10.4(L) 10.9(L)  Hematocrit 36.0 - 46.0 % 28.7(L) 31.9(L) 33.1(L)  Platelets 150 - 400 K/uL 146(L) 114(L) 103(L)    BMET    Component Value Date/Time   NA 137 07/17/2017 0335   K 3.7 07/17/2017 0335   CL 106 07/17/2017 0335   CO2 23 07/17/2017 0335   GLUCOSE 142 (H) 07/17/2017 0335   BUN 13 07/17/2017 0335   CREATININE 0.56 07/17/2017 0335   CALCIUM 7.9 (L) 07/17/2017 0335   GFRNONAA >60 07/17/2017 0335   GFRAA >60 07/17/2017 0335     Adele Barthel, MD, FACS [facility] Office: [phone] Pager: [phone]  07/17/2017, 7:43 AM

## 2017-07-18 ENCOUNTER — Inpatient Hospital Stay (HOSPITAL_COMMUNITY): Payer: Medicare Other

## 2017-07-18 DIAGNOSIS — J9601 Acute respiratory failure with hypoxia: Secondary | ICD-10-CM

## 2017-07-18 DIAGNOSIS — I959 Hypotension, unspecified: Secondary | ICD-10-CM

## 2017-07-18 DIAGNOSIS — Z89612 Acquired absence of left leg above knee: Secondary | ICD-10-CM

## 2017-07-18 DIAGNOSIS — J189 Pneumonia, unspecified organism: Secondary | ICD-10-CM

## 2017-07-18 LAB — BASIC METABOLIC PANEL
Anion gap: 7 (ref 5–15)
BUN: 12 mg/dL (ref 6–20)
CALCIUM: 7.5 mg/dL — AB (ref 8.9–10.3)
CO2: 26 mmol/L (ref 22–32)
CREATININE: 0.58 mg/dL (ref 0.44–1.00)
Chloride: 110 mmol/L (ref 101–111)
GFR calc non Af Amer: >60 mL/min (ref >60)
Glucose, Bld: 145 mg/dL — ABNORMAL HIGH (ref 65–99)
Potassium: 3.9 mmol/L (ref 3.5–5.1)
SODIUM: 143 mmol/L (ref 135–145)

## 2017-07-18 LAB — COMPREHENSIVE METABOLIC PANEL
ALK PHOS: 164 U/L — AB (ref 38–126)
ALT: 17 U/L (ref 14–54)
ANION GAP: 11 (ref 5–15)
AST: 28 U/L (ref 15–41)
Albumin: 2 g/dL — ABNORMAL LOW (ref 3.5–5.0)
BUN: 13 mg/dL (ref 6–20)
CO2: 22 mmol/L (ref 22–32)
Calcium: 7.8 mg/dL — ABNORMAL LOW (ref 8.9–10.3)
Chloride: 107 mmol/L (ref 101–111)
Creatinine, Ser: 0.55 mg/dL (ref 0.44–1.00)
GFR calc non Af Amer: >60 mL/min (ref >60)
Glucose, Bld: 120 mg/dL — ABNORMAL HIGH (ref 65–99)
POTASSIUM: 3.6 mmol/L (ref 3.5–5.1)
SODIUM: 140 mmol/L (ref 135–145)
TOTAL PROTEIN: 4.3 g/dL — AB (ref 6.5–8.1)
Total Bilirubin: 1 mg/dL (ref 0.3–1.2)

## 2017-07-18 LAB — CBC
HCT: 24.5 % — ABNORMAL LOW (ref 36.0–46.0)
Hemoglobin: 8.1 g/dL — ABNORMAL LOW (ref 12.0–15.0)
MCH: 31.3 pg (ref 26.0–34.0)
MCHC: 33.1 g/dL (ref 30.0–36.0)
MCV: 94.6 fL (ref 78.0–100.0)
PLATELETS: 159 10*3/uL (ref 150–400)
RBC: 2.59 MIL/uL — ABNORMAL LOW (ref 3.87–5.11)
RDW: 15.2 % (ref 11.5–15.5)
WBC: 15.3 10*3/uL — AB (ref 4.0–10.5)

## 2017-07-18 LAB — HEPARIN LEVEL (UNFRACTIONATED): Heparin Unfractionated: 0.35 IU/mL (ref 0.30–0.70)

## 2017-07-18 LAB — MAGNESIUM: Magnesium: 1.6 mg/dL — ABNORMAL LOW (ref 1.7–2.4)

## 2017-07-18 MED ORDER — ALBUMIN HUMAN 25 % IV SOLN
25.0000 g | Freq: Once | INTRAVENOUS | Status: DC
  Filled 2017-07-18: qty 50

## 2017-07-18 MED ORDER — MIDODRINE HCL 5 MG PO TABS
5.0000 mg | ORAL_TABLET | Freq: Three times a day (TID) | ORAL | Status: DC
  Administered 2017-07-18 – 2017-07-19 (×3): 5 mg via ORAL
  Filled 2017-07-18 (×6): qty 1

## 2017-07-18 MED ORDER — RESOURCE THICKENUP CLEAR PO POWD
ORAL | Status: DC | PRN
  Filled 2017-07-18 (×4): qty 125

## 2017-07-18 MED ORDER — DILTIAZEM HCL 60 MG PO TABS
60.0000 mg | ORAL_TABLET | Freq: Four times a day (QID) | ORAL | Status: DC
  Administered 2017-07-18 – 2017-07-21 (×8): 60 mg via ORAL
  Filled 2017-07-18 (×13): qty 1

## 2017-07-18 MED ORDER — ALBUMIN HUMAN 25 % IV SOLN
25.0000 g | Freq: Once | INTRAVENOUS | Status: AC
  Administered 2017-07-18: 25 g via INTRAVENOUS
  Filled 2017-07-18: qty 100

## 2017-07-18 MED ORDER — MAGNESIUM SULFATE 2 GM/50ML IV SOLN
2.0000 g | Freq: Once | INTRAVENOUS | Status: AC
  Administered 2017-07-18: 2 g via INTRAVENOUS
  Filled 2017-07-18: qty 50

## 2017-07-18 MED ORDER — FUROSEMIDE 10 MG/ML IJ SOLN
40.0000 mg | Freq: Once | INTRAMUSCULAR | Status: AC
Start: 2017-07-18 — End: 2017-07-18
  Administered 2017-07-18: 40 mg via INTRAVENOUS
  Filled 2017-07-18: qty 4

## 2017-07-18 MED ORDER — POTASSIUM CHLORIDE CRYS ER 20 MEQ PO TBCR
50.0000 meq | EXTENDED_RELEASE_TABLET | Freq: Once | ORAL | Status: AC
  Administered 2017-07-18: 13:00:00 50 meq via ORAL
  Filled 2017-07-18: qty 2

## 2017-07-18 NOTE — Progress Notes (Signed)
Text page to DR Sherral Hammers - made aware of back from CT of chest & that SBP was 80-90's was able to titrate pt off HFNC today & 02 sat 94 % on 3 L n/c.

## 2017-07-18 NOTE — NC FL2 (Signed)
[place] LEVEL OF CARE SCREENING TOOL     IDENTIFICATION  Patient Name: Shelley [name] Birthdate: January 26, 1946 Sex: female Admission Date (Current Location): 07/12/2017  [facility] and [place] Number:  Herbalist and Address:  [facility] [place] [place] [place] [address] [place], [place] [address]      Provider Number: [id number]  Attending Physician Name and Address:  Allie Bossier, MD  Relative Name and Phone Number:       Current Level of Care: Hospital Recommended Level of Care: [place] Prior Approval Number:    Date Approved/Denied:   PASRR Number:    Discharge Plan: SNF    Current Diagnoses: Patient Active Problem List   Diagnosis Date Noted  . Acute respiratory failure with hypoxia ([place])   . Malnutrition of moderate degree 07/17/2017  . Hypoxia   . Atelectasis   . Pleural effusion   . Tobacco abuse   . Lung nodule   . Thromboembolism ([place]) 07/12/2017  . 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 Adjustment disorder with mixed anxiety and depressed mood   . Left pontine cerebrovascular accident ([place]) 06/13/2017  . Benign essential HTN   . Diastolic dysfunction   . Hypokalemia   . Stroke (cerebrum) ([place]) 06/10/2017  . Acute lacunar stroke ([place]) 06/08/2017  . Diplopia 06/08/2017  . Hypertension   . Anxiety   . Seizures ([place])   . Encounter for screening colonoscopy 05/14/2012    Orientation RESPIRATION BLADDER Height & Weight     Self  Other (Comment)(HFNC 8 Liters) Continent Weight: 148 lb 5.9 oz (67.3 kg) Height:  5\' 5"  (165.1 cm)  BEHAVIORAL SYMPTOMS/MOOD NEUROLOGICAL BOWEL NUTRITION STATUS      Continent Diet(please see discharge summary.)  AMBULATORY STATUS COMMUNICATION OF NEEDS Skin   Extensive Assist   Surgical wounds(left leg)                       Personal Care Assistance Level of Assistance  Bathing, Feeding, Dressing Bathing Assistance: Maximum  assistance Feeding assistance: Maximum assistance Dressing Assistance: Maximum assistance     Functional Limitations Info  Sight, Hearing, Speech Sight Info: Adequate Hearing Info: Adequate Speech Info: Adequate    SPECIAL CARE FACTORS FREQUENCY  PT (By licensed PT), OT (By licensed OT), Speech therapy     PT Frequency: 5 times a week  OT Frequency: 5 times a week      Speech Therapy Frequency: 5 times a week       Contractures Contractures Info: Not present    Additional Factors Info  Code Status, Allergies Code Status Info: Full  Allergies Info: Acyclovir And Related           Current Medications (07/18/2017):  This is the current hospital active medication list Current Facility-Administered Medications  Medication Dose Route Frequency Provider Last Rate Last Dose  . 0.9 %  sodium chloride infusion   Intravenous Continuous Cherene Altes, MD 75 mL/hr at 07/17/17 1800    . alum & mag hydroxide-simeth (MAALOX/MYLANTA) 200-200-20 MG/5ML suspension 15-30 mL  15-30 mL Oral Q2H PRN Dagoberto Ligas, PA-C      . atorvastatin (LIPITOR) tablet 20 mg  20 mg Oral Daily Dagoberto Ligas, PA-C   20 mg at 07/18/17 0957  . ceFEPIme (MAXIPIME) 1 g in dextrose 5 % 50 mL IVPB  1 g Intravenous Q8H Cherene Altes, MD   Stopped at 07/18/17 [phone]  .  chlorhexidine (PERIDEX) 0.12 % solution 15 mL  15 mL Mouth Rinse BID Rigoberto Noel, MD   15 mL at 07/18/17 0959  . Chlorhexidine Gluconate Cloth 2 % PADS 6 each  6 each Topical Daily Conrad [place], MD   6 each at 07/17/17 1000  . diltiazem (CARDIZEM) tablet 60 mg  60 mg Oral Q6H Skains, Mark C, MD      . feeding supplement (ENSURE ENLIVE) (ENSURE ENLIVE) liquid 237 mL  237 mL Oral TID BM Cherene Altes, MD   237 mL at 07/18/17 0951  . folic acid (FOLVITE) tablet 1 mg  1 mg Oral Daily Joette Catching T, MD   1 mg at 07/18/17 0958  . heparin ADULT infusion 100 units/mL (25000 units/210mL sodium chloride 0.45%)  1,000 Units/hr  Intravenous Continuous Rudisill, Jodean Lima, RPH 10 mL/hr at 07/18/17 0600 1,000 Units/hr at 07/18/17 0600  . levalbuterol (XOPENEX) nebulizer solution 0.63 mg  0.63 mg Nebulization Q6H Erick Colace, NP   0.63 mg at 07/18/17 0753  . levETIRAcetam (KEPPRA) tablet 500 mg  500 mg Oral BID Cherene Altes, MD   500 mg at 07/18/17 0956  . magnesium oxide (MAG-OX) tablet 400 mg  400 mg Oral BID Cherene Altes, MD   400 mg at 07/18/17 0957  . MEDLINE mouth rinse  15 mL Mouth Rinse q12n4p Rigoberto Noel, MD   15 mL at 07/17/17 1544  . metoprolol tartrate (LOPRESSOR) injection 2-5 mg  2-5 mg Intravenous Q2H PRN Dagoberto Ligas, PA-C      . morphine 2 MG/ML injection 1-2 mg  1-2 mg Intravenous Q2H PRN Cherene Altes, MD   1 mg at 07/18/17 0436  . multivitamin with minerals tablet 1 tablet  1 tablet Oral Daily Cherene Altes, MD   1 tablet at 07/18/17 [phone]  . nicotine (NICODERM CQ - dosed in mg/24 hours) patch 21 mg  21 mg Transdermal Daily Dagoberto Ligas, PA-C   21 mg at 07/18/17 0951  . ondansetron (ZOFRAN) injection 4 mg  4 mg Intravenous Q6H PRN Dagoberto Ligas, PA-C   4 mg at 07/18/17 0940  . oxyCODONE (Oxy IR/ROXICODONE) immediate release tablet 5-10 mg  5-10 mg Oral Q6H PRN Cherene Altes, MD   5 mg at 07/18/17 1013  . polyethylene glycol (MIRALAX / GLYCOLAX) packet 17 g  17 g Oral Daily PRN Dagoberto Ligas, PA-C   17 g at 07/18/17 0953  . RESOURCE THICKENUP CLEAR   Oral PRN Allie Bossier, MD      . sodium chloride flush (NS) 0.9 % injection 10-40 mL  10-40 mL Intracatheter Q12H Conrad [place], MD   10 mL at 07/17/17 2300  . sodium chloride flush (NS) 0.9 % injection 10-40 mL  10-40 mL Intracatheter PRN Conrad [place], MD      . sodium chloride HYPERTONIC 3 % nebulizer solution 4 mL  4 mL Nebulization Daily Erick Colace, NP   4 mL at 07/18/17 0831  . thiamine (VITAMIN B-1) tablet 100 mg  100 mg Oral Daily Joette Catching T, MD   100 mg at 07/17/17 1000  . vancomycin  (VANCOCIN) 500 mg in sodium chloride 0.9 % 100 mL IVPB  500 mg Intravenous Q12H Cherene Altes, MD   Stopped at 07/18/17 [phone]     Discharge Medications: Please see discharge summary for a list of discharge medications.  Relevant Imaging Results:  Relevant Lab Results:   Additional Information SSN- [id number]  Wetzel Bjornstad, LCSWA

## 2017-07-18 NOTE — Evaluation (Signed)
Clinical/Bedside Swallow Evaluation Patient Details  Name: Shelley [name] MRN: [id number] Date of Birth: 11-25-1945  Today's Date: 07/18/2017 Time: SLP Start Time (ACUTE ONLY): 0957 SLP Stop Time (ACUTE ONLY): 1013 SLP Time Calculation (min) (ACUTE ONLY): 16 min  Past Medical History:  Past Medical History:  Diagnosis Date  . Alcohol abuse    drinks awhile   . Anxiety   . CVA (cerebral infarction)    pt denies  . Hypertension   . Osteoporosis   . Seizures ([place])    Past Surgical History:  Past Surgical History:  Procedure Laterality Date  . ABDOMINAL HYSTERECTOMY    . AMPUTATION Left 07/13/2017   Procedure: AMPUTATION ABOVE KNEE;  Surgeon: Waynetta Sandy, MD;  Location: [place];  Service: Vascular;  Laterality: Left;  . BREAST LUMPECTOMY    . COLONOSCOPY WITH PROPOFOL  06/04/2012   Procedure: COLONOSCOPY WITH PROPOFOL;  Surgeon: Danie Binder, MD;  Location: [facility];  Service: Endoscopy;  Laterality: N/A;  entered cecum @ 0752; total cecal withdrawal time = 13 minutes  . EMPYEMA DRAINAGE    . FEMORAL-POPLITEAL BYPASS GRAFT Left 07/12/2017   Procedure: Left Leg Popliteal, Superficial Femoral Artery, [place] Femoral Artery, Anterior Peroneal Artery Thrombectomies.;  Surgeon: Conrad Walthall, MD;  Location: [place];  Service: Vascular;  Laterality: Left;  . FLEXIBLE SIGMOIDOSCOPY  03/14/99   internal hemorrhoids  . OOPHORECTOMY    . POLYPECTOMY  06/04/2012   Procedure: POLYPECTOMY;  Surgeon: Danie Binder, MD;  Location: [facility];  Service: Endoscopy;  Laterality: N/A;  descending colon polyps x2; rectal polyp x1  . THROMBECTOMY FEMORAL ARTERY Left 07/13/2017   Procedure: THROMBECTOMY FEMORAL ARTERY;  Surgeon: Waynetta Sandy, MD;  Location: [place];  Service: Vascular;  Laterality: Left;   HPI:  Pt is a 72 y.o. female with PMH of Alcohol abuse, Anxiety, CVA (cerebral infarction), Hypertension, Osteoporosis, and Seizures and recent admission 06/08/17 for CVA (L dorsal pons, R  cerebellum, and R thalamus, no dysphagia noted at that time). 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 Postoperatively had had respiratory failure requiring hi-flow O2 as well as A-fib w/ RVR. and post-operative anemia and hypotension.    Assessment / Plan / Recommendation Clinical Impression  Pt has generalized oral weakness and a weak cough at baseline. She has coughing that consistently occurs a few seconds after sips of thin liquid, given by cup and by straw. Although intermittent baseline cough continues, she does not cough right after trials of thickened liquids and purees. She does have oral holding with all consistencies. Pt is confused and takes in only small amounts of POs, needing frequent redirection and reorientation. Recommend downgrade to Dys 1 diet and nectar thick liquids with supervision from staff. SLP will f/u to assess for tolerance versus need for instrumental testing as her intake improves. SLP Visit Diagnosis: Dysphagia, oropharyngeal phase (R13.12)    Aspiration Risk  Mild aspiration risk;Moderate aspiration risk    Diet Recommendation Dysphagia 1 (Puree);Nectar-thick liquid   Liquid Administration via: Cup;Straw Medication Administration: Crushed with puree Supervision: Staff to assist with self feeding;Full supervision/cueing for compensatory strategies Compensations: Minimize environmental distractions;Slow rate;Small sips/bites Postural Changes: Seated upright at 90 degrees;Remain upright for at least 30 minutes after po intake    Other  Recommendations Oral Care Recommendations: Oral care BID Other Recommendations: Order thickener from pharmacy;Prohibited food (jello, ice cream, thin soups);Remove water  pitcher   Follow up Recommendations Skilled Nursing facility      Frequency and Duration min 2x/week  2 weeks       Prognosis Prognosis for Safe Diet Advancement:  Good Barriers to Reach Goals: Cognitive deficits      Swallow Study   General HPI: Pt is a 72 y.o. female with PMH of Alcohol abuse, Anxiety, CVA (cerebral infarction), Hypertension, Osteoporosis, and Seizures and recent admission 06/08/17 for CVA (L dorsal pons, R cerebellum, and R thalamus, no dysphagia noted at that time). 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 Postoperatively had had respiratory failure requiring hi-flow O2 as well as A-fib w/ RVR. and post-operative anemia and hypotension.  Type of Study: Bedside Swallow Evaluation Previous Swallow Assessment: none in chart Diet Prior to this Study: Regular;Thin liquids Temperature Spikes Noted: No Respiratory Status: Nasal cannula History of Recent Intubation: No Behavior/Cognition: Alert;Cooperative;Confused;Requires cueing Oral Cavity Assessment: Dried secretions Oral Care Completed by SLP: No Oral Cavity - Dentition: Adequate natural dentition Vision: Functional for self-feeding Self-Feeding Abilities: Needs assist Patient Positioning: Upright in bed Baseline Vocal Quality: Normal Volitional Cough: Weak;Congested    Oral/Motor/Sensory Function Overall Oral Motor/Sensory Function: Generalized oral weakness   Ice Chips Ice chips: Not tested   Thin Liquid Thin Liquid: Impaired Presentation: Cup;Straw Oral Phase Functional Implications: Oral holding Pharyngeal  Phase Impairments: Cough - Delayed    Nectar Thick Nectar Thick Liquid: Impaired Presentation: Straw Oral phase functional implications: Oral holding   Honey Thick Honey Thick Liquid: Not tested   Puree Puree: Impaired Presentation: Spoon Oral Phase Functional Implications: Oral holding   Solid   GO   Solid: Not tested        Germain Osgood 07/18/2017,10:51 AM  Germain Osgood, M.A. CCC-SLP [phone]

## 2017-07-18 NOTE — Progress Notes (Signed)
Upon arrival to patient room to do AM CPT, RN had told RT that they had already worked with patient on her flutter valve.  Stated that patient performed well with 10 good effort breaths.  Will hold on current scheduled CPT due to already doing flutter and asking if she could sleep.

## 2017-07-18 NOTE — Clinical Social Work Note (Signed)
Clinical Social Work Assessment  Patient Details  Name: Shelley [name] MRN: [id number] Date of Birth: 06/17/1946  Date of referral:  07/18/17               Reason for consult:  Facility Placement                Permission sought to share information with:  Family Supports Permission granted to share information::  Yes, Verbal Permission Granted  Name::     Starsha Morning   Agency::     Relationship::  husband   Contact Information:     Housing/Transportation Living arrangements for the past 2 months:  Single Family Home(with husband ) Source of Information:  Spouse Patient Interpreter Needed:  None Criminal Activity/Legal Involvement Pertinent to Current Situation/Hospitalization:  No - Comment as needed Significant Relationships:  Adult Children, Spouse Lives with:  Spouse Do you feel safe going back to the place where you live?  Yes Need for family participation in patient care:  Yes (Comment)  Care giving concerns:  CSW spoke with pt's husband. At this time husband voices no concerns to CSW about pt's care at this time.    Social Worker assessment / plan:  CSW spoke with pt's husband, CSW was informed that he and pt have lived together for 4 years. They are one anothers support system. Per husband pt has a son but pt's son is not very involved in pt's care. [name] is agreeable to SNF placement for rehab at the time of discharge.   Employment status:  Retired Research officer, political party) PT Recommendations:  [place] / Referral to community resources:  [place]  Patient/Family's Response to care:  [name] appeared to be understanding and agreeable to plan of care at this time.   Patient/Family's Understanding of and Emotional Response to Diagnosis, Current Treatment, and Prognosis:  No further questions or concerns have been presented to CSW at this time.   Emotional Assessment Appearance:  Appears  stated age Attitude/Demeanor/Rapport:    Affect (typically observed):  Appropriate, Pleasant Orientation:  Oriented to Self Alcohol / Substance use:  Not Applicable Psych involvement (Current and /or in the community):  No (Comment)  Discharge Needs  Concerns to be addressed:  Care Coordination Readmission within the last 30 days:  No Current discharge risk:  Lack of support system Barriers to Discharge:  No Barriers Identified   Wetzel Bjornstad, [place] 07/18/2017, 10:34 AM

## 2017-07-18 NOTE — Progress Notes (Signed)
ANTICOAGULATION CONSULT NOTE - Follow Up Consult  Pharmacy Consult for Heparin Indication: atrial fibrillation  Allergies  Allergen Reactions  . Acyclovir And Related    Patient Measurements: Height: 5\' 5"  (165.1 cm) Weight: 148 lb 5.9 oz (67.3 kg) IBW/kg (Calculated) : 57 Heparin Dosing Weight: 69 kg  Vital Signs: Temp: 98 F (36.7 C) (01/16 1522) Temp Source: Oral (01/16 1522) BP: 89/50 (01/16 1530) Pulse Rate: 83 (01/16 1530)  Labs: Recent Labs    07/16/17 0522 07/17/17 0335 07/17/17 1121 07/18/17 0716  HGB 10.4* 9.6*  --  8.1*  HCT 31.9* 28.7*  --  24.5*  PLT 114* 146*  --  159  HEPARINUNFRC 0.41 0.26* 0.37 0.35  CREATININE 0.56 0.56  --  0.55   Estimated Creatinine Clearance: 57.2 mL/min (by C-G formula based on SCr of 0.55 mg/dL).  Assessment: CC/HPI: leg pain, claudication  PMH: tobacco, HTN, EtOH, anxiety, seizure, embolic CVA   Anticoag: Heparin for new-onset of Afib causing thrombosis, left leg discoloration s/p thrombectomy 1/10, recent CVA on 06/08/17 -s/p L femoral TE in addition to L AKA  -resumed full dose 1/12 am per dr Donzetta Matters -heparin level therapeutic: 0.35, no infusion issues or bleeding reported  Renal: SCr 0.55  Heme/Onc: H&H 8.1/24.57, Plt 159  Goal of Therapy:  Heparin level 0.3-0.7 units/ml Monitor platelets by anticoagulation protocol: Yes   Plan:  Continue heparin gtt at 1000 units/hr Daily heparin level, CBC F/U long-term AC plans  Albertina Parr, PharmD., BCPS Clinical Pharmacist Pager [phone]

## 2017-07-18 NOTE — Progress Notes (Signed)
PULMONARY / CRITICAL CARE MEDICINE   Name: Shelley [name] MRN: [id number] DOB: [birth year]    ADMISSION DATE:  07/12/2017 CONSULTATION DATE:  07/12/2017  REFERRING MD:  Dr Bridgett Larsson  CHIEF COMPLAINT:  Left foot pain  SUBJECTIVE:  "Pooped" today. Breathing feels pretty good though.   VITAL SIGNS: BP (!) 89/65 (BP Location: Right Arm) Comment: Cardizem decreased - see MAR   Pulse 89   Temp 97.6 F (36.4 C) (Oral)   Resp (!) 25   Ht 5\' 5"  (1.651 m)   Wt 67.3 kg (148 lb 5.9 oz)   SpO2 95%   BMI 24.69 kg/m  High flow oxygen   hEMODYNAMICS:    VENTILATOR SETTINGS:    INTAKE / OUTPUT: I/O last 3 completed shifts: In: 1955.8 [I.V.:1955.8] Out: 5784 [Urine:1435]  PHYSICAL EXAMINATION: General: Elderly female in NAD HEENT: Normocephalic atraumatic no jugular venous distention. Pulmonary: Diminished on the L, scattered rales on the R.  Cardiac: Irregular irregular, no MRG Abdomen: Soft nontender no organomegaly positive bowel sounds Extremities: Status post left AKA  Pain is tolerable Neuro: Weak, moves extremities, oriented x2  LABS:  BMET Recent Labs  Lab 07/15/17 0437 07/16/17 0522 07/17/17 0335  NA 139 137 137  K 3.8 3.6 3.7  CL 110 107 106  CO2 22 22 23   BUN 14 11 13   CREATININE 0.57 0.56 0.56  GLUCOSE 114* 107* 142*   Electrolytes Recent Labs  Lab 07/13/17 0434  07/14/17 0748 07/15/17 0437 07/16/17 0522 07/17/17 0335  CALCIUM 8.7*   < > 8.1* 7.8* 8.0* 7.9*  MG 2.1  --  1.5* 1.8 1.5* 1.8  PHOS 3.9  --  2.1* 3.0  --   --    < > = values in this interval not displayed.   CBC Recent Labs  Lab 07/15/17 0437 07/16/17 0522 07/17/17 0335  WBC 11.1* 10.6* 13.3*  HGB 10.9* 10.4* 9.6*  HCT 33.1* 31.9* 28.7*  PLT 103* 114* 146*   Coag's Recent Labs  Lab 07/13/17 0434  INR 1.18   Sepsis Markers Recent Labs  Lab 07/12/17 2328 07/13/17 0434 07/13/17 2326 07/17/17 1121  LATICACIDVEN 0.9 1.0 1.1  --   PROCALCITON  --   --   --  0.20    ABG Recent Labs  Lab 07/12/17 1711 07/12/17 1824  PHART 7.404 7.374  PCO2ART 24.8* 30.5*  PO2ART 76.0* 184.0*   Liver Enzymes Recent Labs  Lab 07/13/17 0434 07/17/17 0335  AST 43* 28  ALT 20 17  ALKPHOS 100 191*  BILITOT 1.2 1.1  ALBUMIN 3.4* 2.1*   Cardiac Enzymes No results for input(s): TROPONINI, PROBNP in the last 168 hours.  Glucose Recent Labs  Lab 07/12/17 2054 07/14/17 2020 07/14/17 2348 07/15/17 0408  GLUCAP 121* 134* 114* 105*   Imaging Dg Chest Port 1 View  Result Date: 07/18/2017 CLINICAL DATA:  Shortness of Breath EXAM: PORTABLE CHEST 1 VIEW COMPARISON:  July 16, 2017 FINDINGS: Central catheter tip is at the junction of the left innominate vein and superior vena cava. No pneumothorax. There is extensive consolidation throughout the left lung with volume loss, increased from 2 days prior. There is persistent elevation of the left hemidiaphragm. Right lung is clear except for mild scarring in the right upper lobe. Heart size is normal. There is aortic atherosclerosis. No adenopathy evident. Visualized pulmonary vascularity is unremarkable. IMPRESSION: Near complete consolidation left lung with volume loss. There has been progression of consolidation and volume loss on the left compared to 2  days prior. Right lung clear except for mild scarring right upper lobe. Stable cardiac silhouette. No pneumothorax. There is aortic atherosclerosis. Aortic Atherosclerosis (ICD10-I70.0). Electronically Signed   By: Lowella Grip III M.D.   On: 07/18/2017 07:59   STUDIES:  CXR 1/10 > RUL nodular density.  CULTURES: None.  ANTIBIOTICS: Cefepime 1/14 Vancomycin 1/14  SIGNIFICANT EVENTS: 1/10 > admitted with left leg pain, likely ischemic from embolic phenomenon.  Taken to OR.  LINES/TUBES: L IJ CVL 1/10 >   DISCUSSION: 72 y.o. female admitted 1/10 with left leg pain.  Felt to have ischemia; therefore, taken to OR for attempts at thrombectomy.  In OR,  found to have dead muscle in all LLE compartments. Per family, vascular planning on amputation in AM 1/11.  ASSESSMENT / PLAN:  Circulatory shock (resolved) Fibrillation with rapid ventricular response Hypertension Fluid and electrolyte imbalance  history of CVA Status post left AKA Acute encephalopathy  grade 2 diastolic dysfunction   Pulmonary issues Acute Hypoxic Respiratory failure with ongoing high FiO2 needs. Progressive left-sided airspace disease most consistent with pneumonia/atelectasis -Cannot exclude aspiration event/hcap -Bedside ultrasound sound review 1/15 demonstrated very little pleural effusion certainly not enough to explain the degree of hypoxia and not enough volume to warrant thoracentesis -almost total collapse L on 1/16. Plan Continue high flow oxygen Aggressive pulmonary hygiene Will diurese today.  In regards to possible pneumonia: Now on day #4 cefepime and vancomycin. Consider narrow to cefepime alone. PCT low 1/15. CT chest  RUL nodular density. Plan CT chest today.   Tobacco dependence Plan Nicotine patch  Georgann Housekeeper, AGACNP-BC [place] [facility]/Critical Care Pager [phone] or [phone]  07/18/2017 9:19 AM  Attending Note:  72 year old female with PMH as above presenting to PCCM post vascular surgery and amputation who was hypotensive and was in the ICU.  Patient is no longer hypotensive now and seems to be tolerating current BP.  On exam, lungs are clear.  I reviewed CXR myself, no acute disease noted.  Discussed with PCCM-NP.  Respiratory failure:  - Monitor airway protection closely  Hypoxemia:  - Titrate O2 for sat of 88-92%  - Will need a desat study prior to discharge for home O2  RUL nodular density  - CT of the chest today  - Please arrange f/u for lung nodule with PCCM as outpatient upon discharge  Tobacco abuse  - Smoking cessation  Patient seen and examined, agree with above note.  I dictated the care  and orders written for this patient under my direction.  Rush Farmer, [place]

## 2017-07-18 NOTE — Progress Notes (Signed)
Progress Note  Patient Name: Shelley [name] Date of Encounter: 07/18/2017  Primary Cardiologist: Cristopher Peru, MD   Subjective   Denies any chest pain or shortness of breath.  Mildly confused. Inpatient Medications    Scheduled Meds: . atorvastatin  20 mg Oral Daily  . chlorhexidine  15 mL Mouth Rinse BID  . Chlorhexidine Gluconate Cloth  6 each Topical Daily  . diltiazem  60 mg Oral Q6H  . feeding supplement (ENSURE ENLIVE)  237 mL Oral TID BM  . folic acid  1 mg Oral Daily  . furosemide  40 mg Intravenous Once  . levalbuterol  0.63 mg Nebulization Q6H  . levETIRAcetam  500 mg Oral BID  . magnesium oxide  400 mg Oral BID  . mouth rinse  15 mL Mouth Rinse q12n4p  . multivitamin with minerals  1 tablet Oral Daily  . nicotine  21 mg Transdermal Daily  . sodium chloride flush  10-40 mL Intracatheter Q12H  . sodium chloride HYPERTONIC  4 mL Nebulization Daily  . thiamine  100 mg Oral Daily   Continuous Infusions: . sodium chloride 75 mL/hr at 07/17/17 1800  . ceFEPime (MAXIPIME) IV Stopped (07/18/17 0657)  . heparin 1,000 Units/hr (07/18/17 0600)  . vancomycin Stopped (07/18/17 0851)   PRN Meds: alum & mag hydroxide-simeth, metoprolol tartrate, morphine injection, ondansetron, oxyCODONE, polyethylene glycol, sodium chloride flush   Vital Signs    Vitals:   07/18/17 0830 07/18/17 0900 07/18/17 0930 07/18/17 1000  BP: 110/69 116/79 104/68 107/74  Pulse: 90 83 69 (!) 102  Resp: 18 20 20  (!) 22  Temp:      TempSrc:      SpO2: 100% 98% 91% 93%  Weight:      Height:        Intake/Output Summary (Last 24 hours) at 07/18/2017 1031 Last data filed at 07/18/2017 1000 Gross per 24 hour  Intake 2126.41 ml  Output 675 ml  Net 1451.41 ml   Filed Weights   07/12/17 1643 07/13/17 1741 07/14/17 0545  Weight: 152 lb 5.4 oz (69.1 kg) 152 lb 5.4 oz (69.1 kg) 148 lb 5.9 oz (67.3 kg)    Telemetry    Atrial fibrillation heart rate under better control.  Currently 100.-  Personally Reviewed  ECG    Atrial fibrillation with rapid ventricular response- Personally Reviewed  Physical Exam   GEN: Well nourished, well developed, in no acute distress  HEENT: normal central line noted Neck: no JVD, carotid bruits, or masses Cardiac: irreg; no murmurs, rubs, or gallops,no edema  Respiratory:  clear to auscultation bilaterally, normal work of breathing GI: soft, nontender, nondistended, + BS MS: no deformity or atrophy  Skin: warm and dry, no rash Neuro:  Alert  Strength and sensation are intact Psych: Confusion at baseline   Labs    Chemistry Recent Labs  Lab 07/13/17 0434  07/16/17 0522 07/17/17 0335 07/18/17 0716  NA 136   < > 137 137 140  K 4.0   < > 3.6 3.7 3.6  CL 107   < > 107 106 107  CO2 17*   < > 22 23 22   GLUCOSE 178*   < > 107* 142* 120*  BUN 27*   < > 11 13 13   CREATININE 0.78   < > 0.56 0.56 0.55  CALCIUM 8.7*   < > 8.0* 7.9* 7.8*  PROT 6.0*  --   --  4.3* 4.3*  ALBUMIN 3.4*  --   --  2.1* 2.0*  AST 43*  --   --  28 28  ALT 20  --   --  17 17  ALKPHOS 100  --   --  191* 164*  BILITOT 1.2  --   --  1.1 1.0  GFRNONAA >60   < > >60 >60 >60  GFRAA >60   < > >60 >60 >60  ANIONGAP 12   < > 8 8 11    < > = values in this interval not displayed.     Hematology Recent Labs  Lab 07/16/17 0522 07/17/17 0335 07/18/17 0716  WBC 10.6* 13.3* 15.3*  RBC 3.46* 3.12* 2.59*  HGB 10.4* 9.6* 8.1*  HCT 31.9* 28.7* 24.5*  MCV 92.2 92.0 94.6  MCH 30.1 30.8 31.3  MCHC 32.6 33.4 33.1  RDW 14.6 14.5 15.2  PLT 114* 146* 159    Cardiac EnzymesNo results for input(s): TROPONINI in the last 168 hours.  Recent Labs  Lab 07/12/17 1552  TROPIPOC 0.03     BNPNo results for input(s): BNP, PROBNP in the last 168 hours.   DDimer No results for input(s): DDIMER in the last 168 hours.   Radiology    Dg Chest Port 1 View  Result Date: 07/18/2017 CLINICAL DATA:  Shortness of Breath EXAM: PORTABLE CHEST 1 VIEW COMPARISON:  July 16, 2017  FINDINGS: Central catheter tip is at the junction of the left innominate vein and superior vena cava. No pneumothorax. There is extensive consolidation throughout the left lung with volume loss, increased from 2 days prior. There is persistent elevation of the left hemidiaphragm. Right lung is clear except for mild scarring in the right upper lobe. Heart size is normal. There is aortic atherosclerosis. No adenopathy evident. Visualized pulmonary vascularity is unremarkable. IMPRESSION: Near complete consolidation left lung with volume loss. There has been progression of consolidation and volume loss on the left compared to 2 days prior. Right lung clear except for mild scarring right upper lobe. Stable cardiac silhouette. No pneumothorax. There is aortic atherosclerosis. Aortic Atherosclerosis (ICD10-I70.0). Electronically Signed   By: Lowella Grip III M.D.   On: 07/18/2017 07:59   Dg Chest Port 1 View  Result Date: 07/16/2017 CLINICAL DATA:  Hypoxia EXAM: PORTABLE CHEST 1 VIEW COMPARISON:  07/12/2017 FINDINGS: Moderate left pleural effusion with left lower lobe atelectasis or infiltrate. Appearance is concerning for pneumonia. Small right pleural effusions suspected. No confluent opacity on the right. Heart is borderline in size. Left central line is unchanged. IMPRESSION: Moderate left effusion with left lower lobe airspace opacity concerning for pneumonia. Small right effusion. Electronically Signed   By: Rolm Baptise M.D.   On: 07/16/2017 10:53    Cardiac Studies   Echocardiogram-normal LV function, trace AI, mildly dilated aortic root, mild to moderate tricuspid regurgitation.  Patient Profile     72 y.o. female recent stroke, embolic event to left lower extremity requiring amputation.  Atrial fibrillation.  Assessment & Plan    Persistent atrial fibrillation - Diltiazem drip currently at 10 down from 12.5.  Maintaining adequate rate control, 100 currently on tele.  Single injection of  0.25 mg of digoxin administered on 07/15/17 at 1:24 PM..  Requirements for lifelong anticoagulation, transition to Eliquis at discharge.  Currently on IV heparin. Recent surgery and lung event driving heart rate.  -We will transition her over to p.o. medication, diltiazem 60 mg every 6 hours.  Nurse states that she is now taking p.o.  Stop drip.  Embolic event lower extremity requiring  amputation -Vascular surgery, stable, left AKA  Essential hypertension -Previous pressor use.  Still remains slightly low.  Improved however.  Acute hypoxic respiratory failure/ ?PNA -High flow oxygen.  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 Pulmonary team is seeing.   Tobacco use -Continue to encourage cessation.  No changes  Alcohol use -Protocol in place, CIWA    For questions or updates, please contact [place] Please consult [email or web address] for contact info under Cardiology/STEMI.      Signed, Candee Furbish, MD  07/18/2017, 10:31 AM

## 2017-07-18 NOTE — Progress Notes (Signed)
PROGRESS NOTE    Shelley [name]  OAC:[id number] DOB: 09-11-1945 DOA: 07/12/2017 PCP: [name], MD   Brief Narrative:  72 y.o. WF  PMHx Alcohol abuse, Anxiety, CVA, Seizures, HTN, Osteoporosis Presents with cc: left foot pain.  Patient's memory and cognitive function is limited per her husband, so portions of this history are reconstructed from the EMR and the patient.  The patient is unclear when her left leg pain began.  She says a "few days ago."  The husband notes the patient has been complaining at night of left foot pain over the last two nights.  It is unclear whether the patient previously had any intermittent claudication or rest pain.  Over the last day reportedly she has had marked skin change in the left foot and calf with now paralysis and anesthesia.  She also notes a general sense of poor health with fatigue and repeated coughing.  .     Subjective: 1/16  A/O 2, does not know (where, why), negative CP positive SOB, negative abdominal pain. 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 Fib but rate controlled. -Cardiology on board -Cardizem 60 mg QID -Metoprolol PRN -Digoxin 0.25 mg 1 -Heparin drip:ill BE held at midnight for Am thoracentesis. When patient stable and close to discharge will switch to DOAC. Cardiology prefers Apixaban   Chronic diastolic CHF? -Echocardiogram unable to determine diastolic function secondary to A. fib  -Strict in and out since admission +6.7 L -Daily weight Filed Weights   07/12/17 1643 07/13/17 1741 07/14/17 0545  Weight: 152 lb 5.4 oz (69.1 kg) 152 lb 5.4 oz (69.1 kg) 148 lb 5.9 oz (67.3 kg)  -Transfuse for hemoglobin<8  Essential  HTN/Hypotensive -Currently soft BP: Multifactorial A. fib RVR, post surgery -MAP goal> 65 -1/16 start Midodrin 5 mg TID +Albumin 25 g -  Acute Hypoxic Respiratory failure - itrate O2 to maintain SPO2 89-93%  HCAP -continue Empiric antibiotics  Large LEFT pleural effusion -DC heparin at midnight -Nothing by mouth midnight -Thoracentesis at bedside vs IR (Diagnostic/therapeutic patient has high risk for malignancy)  RUL nodular density. -CT angiogram: Spiculated mass 2., See results below PET- CT recommended. This will completed post outpatient.  -large left pleural effusion: See above    Tobacco dependence -nicotine patch  Altered mental status -Judicious use of narcotics and benzodiazepine with patient. Patient sensitive to narcotics.  Hx CVA  -  currently nonissue  AKI(baseline Cr<1) Recent Labs  Lab 07/13/17 0434 07/13/17 2326 07/14/17 0748 07/15/17 0437 07/16/17 0522 07/17/17 0335  CREATININE 0.78 0.65 0.60 0.57 0.56 0.56  - Baseline  Thrombocytopenia(baseline~180) -Multifactorial EtOH abuse, secondary to surgery -Platelets still low: Currently no signs of active bleeding continue to monitor closely  LEFT AKA/EMBOLIC event LLE -Negative sign of infection, staple line intact. Watch carefully for any possible signs of infection  EtOH abuse -DC CIWA protocol patient outside window for withdrawal.  Hypokalemia -Potassium goal> 4 -K-Dur 50 mEq  Hypomagnesemia -Magnesium goal> 2 -Magnesium oxide 400 mg BID -Magnesium IV 2 g   DVT prophylaxis: Heparin gtt Code Status: Full Family Communication: None Disposition Plan: TBD   Consultants:  Vascular surgery [facility] M    Procedures/Significant Events:  1/10  PCXR:Persistent irregular nodular density in the right upper lobe  1/11 CT angiogram chest aorta W/W contrast:-There is abrupt occlusion of the left common femoral artery. The profunda femoral, superficial femoral, popliteal artery, and  tibial arteries are also non-opacified in the left lower extremity. Diffuse vessel occlusion and thrombosis cannot be excluded. Given gas in the soft tissues of the left lower extremity, ischemia with necrotizing fasciitis and possible dead tissue are of clinical concern. -Occlusion of the anterior division of the left internal iliac artery. 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 - 2x spiculated masses  RUL Malignancy is not excluded. PET-CT is recommended. -Consolidation and collapse in the left lower lobe -Swelling and fullness of the musculature in the left leg worrisome for ischemia an impending compartment syndrome. 1/11- Left lower extremity embolectomy/ Left above knee amputation 1/11 Echocardiogram: LVEF; 65% to 70%. -Negative RWMA -Study was not technically sufficient to allow evaluation of LV diastolic dysfunction due to atrial   fibrillation. - Tricuspid valve mild-moderate regurgitation. - Pulmonic valve: There was trivial regurgitation. - Pulmonary arteries: PA peak pressure: 32 mm Hg (S). 1/16 PCXR: Near complete consolidation left lung with volume loss. There has been progression of consolidation and volume loss on the left compared to 2 days prior. Right lung clear except for mild scarring right upper lobe. Stable cardiac silhouette. No pneumothorax. There is aortic atherosclerosis. 1/16 CT chest W/O contrast:Large left pleural effusion with compressive atelectasis and overall volume loss. 2. Small right pleural effusion.  Mild right basilar atelectasis. 3. Airspace disease in the posterior segment of the right upper lobe most concerning for pneumonia. 4. Spiculated 1.4 x 0.6 mm right upper lobe pulmonary nodule adjacent, partially contiguous, spiculated pulmonary nodule measuring 9 x 8 mm in the right upper lobe. Malignancy is not excluded. PET-CT is recommended.     I have personally reviewed and interpreted all radiology studies and my findings are as  above.  VENTILATOR SETTINGS:   Cultures   Antimicrobials: Anti-infectives (From admission, onward)   Start     Stop   07/16/17 2200  ceFEPIme (MAXIPIME) 1 g in dextrose 5 % 50 mL IVPB     07/24/17 2159   07/16/17 2100  vancomycin (VANCOCIN) 500 mg in sodium chloride 0.9 % 100 mL IVPB         07/13/17 1845  ceFAZolin (ANCEF) IVPB 2g/100 mL premix     07/13/17 1840   07/13/17 1817  ceFAZolin (ANCEF) 2-4 GM/100ML-% IVPB    Comments:  Grace Blight   : cabinet override   07/13/17 1840       Devices    LINES / TUBES:      Continuous Infusions: . sodium chloride 75 mL/hr at 07/17/17 1800  . ceFEPime (MAXIPIME) IV Stopped (07/18/17 0657)  . diltiazem (CARDIZEM) infusion 10 mg/hr (07/18/17 0747)  . heparin 1,000 Units/hr (07/18/17 0600)  . vancomycin 500 mg (07/18/17 0751)     Objective: Vitals:   07/18/17 0630 07/18/17 0700 07/18/17 0722 07/18/17 0730  BP: 93/64 (!) 72/60  (!) 89/65  Pulse: 96 93  89  Resp: (!) 23 (!) 22  (!) 25  Temp:   97.6 F (36.4 C)   TempSrc:   Oral   SpO2: 97% 100%  95%  Weight:      Height:        Intake/Output Summary (Last 24 hours) at 07/18/2017 0825 Last data filed at 07/18/2017 0727 Gross per 24 hour  Intake 1521.95 ml  Output 670 ml  Net  851.95 ml   Filed Weights   07/12/17 1643 07/13/17 1741 07/14/17 0545  Weight: 152 lb 5.4 oz (69.1 kg) 152 lb 5.4 oz (69.1 kg) 148 lb 5.9 oz (67.3 kg)    Physical Exam:  General: A/O 2, does not know (where, why) positive acute respiratory distress Neck:  Negative scars, masses, torticollis, lymphadenopathy, JVD, left IJ CVL in place covered and clean negative sign of infection. Lungs: right lung fields Clear to auscultation, left lung fields absent breath sounds. Negative wheezes or crackles Cardiovascular: Regular rate and rhythm without murmur gallop or rub normal S1 and S2 Abdomen: negative abdominal pain, nondistended, positive soft, bowel sounds, no rebound, no ascites, no  appreciable mass Extremities: No significant cyanosis, clubbing RLE. LEFT AKA staple line clean negative sign of infection, covered and clean. Psychiatric:  Negative depression, negative anxiety, negative fatigue, negative mania  Central nervous system:  Cranial nerves II through XII intact, tongue/uvula midline, all extremities muscle strength 5/5, sensation intact throughout, negative dysarthria, negative expressive aphasia, negative receptive aphasia. .     Data Reviewed: Care during the described time interval was provided by me .  I have reviewed this patient's available data, including medical history, events of note, physical examination, and all test results as part of my evaluation.   CBC: Recent Labs  Lab 07/12/17 1545  07/13/17 2326 07/14/17 0546 07/15/17 0437 07/16/17 0522 07/17/17 0335  WBC 13.6*   < > 12.4* 14.3* 11.1* 10.6* 13.3*  NEUTROABS 10.4*  --   --   --   --   --   --   HGB 16.1*   < > 8.7* 12.3 10.9* 10.4* 9.6*  HCT 48.4*   < > 26.4* 37.1 33.1* 31.9* 28.7*  MCV 92.0   < > 93.6 91.2 91.7 92.2 92.0  PLT 176   < > 119* 132* 103* 114* 146*   < > = values in this interval not displayed.   Basic Metabolic Panel: Recent Labs  Lab 07/12/17 2230 07/13/17 0434 07/13/17 2326 07/14/17 0748 07/15/17 0437 07/16/17 0522 07/17/17 0335  NA  --  136 138 137 139 137 137  K  --  4.0 3.8 3.7 3.8 3.6 3.7  CL  --  107 113* 111 110 107 106  CO2  --  17* 20* 18* 22 22 23   GLUCOSE  --  178* 116* 143* 114* 107* 142*  BUN  --  27* 20 17 14 11 13   CREATININE  --  0.78 0.65 0.60 0.57 0.56 0.56  CALCIUM  --  8.7* 7.7* 8.1* 7.8* 8.0* 7.9*  MG 1.6* 2.1  --  1.5* 1.8 1.5* 1.8  PHOS 4.0 3.9  --  2.1* 3.0  --   --    GFR: Estimated Creatinine Clearance: 57.2 mL/min (by C-G formula based on SCr of 0.56 mg/dL). Liver Function Tests: Recent Labs  Lab 07/13/17 0434 07/17/17 0335  AST 43* 28  ALT 20 17  ALKPHOS 100 191*  BILITOT 1.2 1.1  PROT 6.0* 4.3*  ALBUMIN 3.4* 2.1*    No results for input(s): LIPASE, AMYLASE in the last 168 hours. Recent Labs  Lab 07/17/17 0335  AMMONIA 29   Coagulation Profile: Recent Labs  Lab 07/13/17 0434  INR 1.18   Cardiac Enzymes: Recent Labs  Lab 07/13/17 0434  CKTOTAL 2,279*   BNP (last 3 results) No results for input(s): PROBNP in the last 8760 hours. HbA1C: No results for input(s): HGBA1C in the last 72 hours. CBG: Recent Labs  Lab 07/12/17 2054 07/14/17 2020 07/14/17 2348 07/15/17 0408  GLUCAP 121* 134* 114* 105*   Lipid Profile: No results for input(s): CHOL, HDL, LDLCALC, TRIG, CHOLHDL, LDLDIRECT in the last 72 hours. Thyroid Function Tests: No results for input(s): TSH, T4TOTAL, FREET4, T3FREE, THYROIDAB in the last 72 hours. Anemia Panel: No results for input(s): VITAMINB12, FOLATE, FERRITIN, TIBC, IRON, RETICCTPCT in the last 72 hours. Urine analysis:    Component Value Date/Time   COLORURINE YELLOW 06/08/2017 2055   APPEARANCEUR HAZY (A) 06/08/2017 2055   LABSPEC 1.014 06/08/2017 2055   PHURINE 7.0 06/08/2017 2055   GLUCOSEU NEGATIVE 06/08/2017 2055   HGBUR NEGATIVE 06/08/2017 2055   BILIRUBINUR NEGATIVE 06/08/2017 2055   KETONESUR 20 (A) 06/08/2017 2055   PROTEINUR NEGATIVE 06/08/2017 2055   UROBILINOGEN 0.2 06/14/2010 0741   NITRITE NEGATIVE 06/08/2017 2055   LEUKOCYTESUR NEGATIVE 06/08/2017 2055   Sepsis Labs: @LABRCNTIP (procalcitonin:4,lacticidven:4)  ) Recent Results (from the past 240 hour(s))  MRSA PCR Screening     Status: None   Collection Time: 07/12/17 11:20 PM  Result Value Ref Range Status   MRSA by PCR NEGATIVE NEGATIVE Final    Comment:        The GeneXpert MRSA Assay (FDA approved for NASAL specimens only), is one component of a comprehensive MRSA colonization surveillance program. It is not intended to diagnose MRSA infection nor to guide or monitor treatment for MRSA infections.   Surgical pcr screen     Status: None   Collection Time: 07/13/17 11:15  AM  Result Value Ref Range Status   MRSA, PCR NEGATIVE NEGATIVE Final   Staphylococcus aureus NEGATIVE NEGATIVE Final    Comment: (NOTE) The Xpert SA Assay (FDA approved for NASAL specimens in patients 57 years of age and older), is one component of a comprehensive surveillance program. It is not intended to diagnose infection nor to guide or monitor treatment.          Radiology Studies: Dg Chest Port 1 View  Result Date: 07/18/2017 CLINICAL DATA:  Shortness of Breath EXAM: PORTABLE CHEST 1 VIEW COMPARISON:  July 16, 2017 FINDINGS: Central catheter tip is at the junction of the left innominate vein and superior vena cava. No pneumothorax. There is extensive consolidation throughout the left lung with volume loss, increased from 2 days prior. There is persistent elevation of the left hemidiaphragm. Right lung is clear except for mild scarring in the right upper lobe. Heart size is normal. There is aortic atherosclerosis. No adenopathy evident. Visualized pulmonary vascularity is unremarkable. IMPRESSION: Near complete consolidation left lung with volume loss. There has been progression of consolidation and volume loss on the left compared to 2 days prior. Right lung clear except for mild scarring right upper lobe. Stable cardiac silhouette. No pneumothorax. There is aortic atherosclerosis. Aortic Atherosclerosis (ICD10-I70.0). Electronically Signed   By: Lowella Grip III M.D.   On: 07/18/2017 07:59   Dg Chest Port 1 View  Result Date: 07/16/2017 CLINICAL DATA:  Hypoxia EXAM: PORTABLE CHEST 1 VIEW COMPARISON:  07/12/2017 FINDINGS: Moderate left pleural effusion with left lower lobe atelectasis or infiltrate. Appearance is concerning for pneumonia. Small right pleural effusions suspected. No confluent opacity on the right. Heart is borderline in size. Left central line is unchanged. IMPRESSION: Moderate left effusion with left lower lobe airspace opacity concerning for pneumonia. Small  right effusion. Electronically Signed   By: Rolm Baptise M.D.   On: 07/16/2017 10:53        Scheduled Meds: . atorvastatin  20 mg Oral Daily  . chlorhexidine  15 mL Mouth Rinse BID  . Chlorhexidine Gluconate Cloth  6 each Topical Daily  . feeding supplement (ENSURE ENLIVE)  237 mL Oral TID BM  . folic acid  1 mg Oral Daily  . levalbuterol  0.63 mg Nebulization Q6H  . levETIRAcetam  500 mg Oral BID  . magnesium oxide  400 mg Oral BID  . mouth rinse  15 mL Mouth Rinse q12n4p  . multivitamin with minerals  1 tablet Oral Daily  . nicotine  21 mg Transdermal Daily  . sodium chloride flush  10-40 mL Intracatheter Q12H  . sodium chloride HYPERTONIC  4 mL Nebulization Daily  . thiamine  100 mg Oral Daily   Continuous Infusions: . sodium chloride 75 mL/hr at 07/17/17 1800  . ceFEPime (MAXIPIME) IV Stopped (07/18/17 0657)  . diltiazem (CARDIZEM) infusion 10 mg/hr (07/18/17 0747)  . heparin 1,000 Units/hr (07/18/17 0600)  . vancomycin 500 mg (07/18/17 0751)     LOS: 6 days    Time spent: 40 minutes    Demontez Novack, Geraldo Docker, MD [facility] Pager [phone]   If 7PM-7AM, please contact night-coverage [email or web address] Password [email or web address] 07/18/2017, 8:25 AM

## 2017-07-19 ENCOUNTER — Encounter (HOSPITAL_COMMUNITY): Payer: Self-pay | Admitting: Radiology

## 2017-07-19 ENCOUNTER — Inpatient Hospital Stay: Payer: Medicare Other | Admitting: Physical Medicine & Rehabilitation

## 2017-07-19 ENCOUNTER — Inpatient Hospital Stay (HOSPITAL_COMMUNITY): Payer: Medicare Other

## 2017-07-19 DIAGNOSIS — I480 Paroxysmal atrial fibrillation: Secondary | ICD-10-CM

## 2017-07-19 HISTORY — PX: IR THORACENTESIS ASP PLEURAL SPACE W/IMG GUIDE: IMG5380

## 2017-07-19 LAB — GLUCOSE, PLEURAL OR PERITONEAL FLUID: Glucose, Fluid: 118 mg/dL

## 2017-07-19 LAB — GRAM STAIN

## 2017-07-19 LAB — LACTATE DEHYDROGENASE, PLEURAL OR PERITONEAL FLUID: LD FL: 95 U/L — AB (ref 3–23)

## 2017-07-19 LAB — BODY FLUID CELL COUNT WITH DIFFERENTIAL
EOS FL: 0 %
Lymphs, Fluid: 19 %
Monocyte-Macrophage-Serous Fluid: 6 % — ABNORMAL LOW (ref 50–90)
Neutrophil Count, Fluid: 75 % — ABNORMAL HIGH (ref 0–25)
Total Nucleated Cell Count, Fluid: 203 cu mm (ref 0–1000)

## 2017-07-19 LAB — CBC
HCT: 18.2 % — ABNORMAL LOW (ref 36.0–46.0)
Hemoglobin: 5.9 g/dL — CL (ref 12.0–15.0)
MCH: 30.1 pg (ref 26.0–34.0)
MCHC: 32.4 g/dL (ref 30.0–36.0)
MCV: 92.9 fL (ref 78.0–100.0)
PLATELETS: 162 10*3/uL (ref 150–400)
RBC: 1.96 MIL/uL — ABNORMAL LOW (ref 3.87–5.11)
RDW: 14.8 % (ref 11.5–15.5)
WBC: 12.5 10*3/uL — AB (ref 4.0–10.5)

## 2017-07-19 LAB — PROTEIN, PLEURAL OR PERITONEAL FLUID

## 2017-07-19 LAB — PREPARE RBC (CROSSMATCH)

## 2017-07-19 LAB — HEMOGLOBIN AND HEMATOCRIT, BLOOD
HCT: 19.6 % — ABNORMAL LOW (ref 36.0–46.0)
Hemoglobin: 6.4 g/dL — CL (ref 12.0–15.0)

## 2017-07-19 LAB — SAVE SMEAR

## 2017-07-19 LAB — MAGNESIUM: MAGNESIUM: 2.1 mg/dL (ref 1.7–2.4)

## 2017-07-19 LAB — HEPARIN LEVEL (UNFRACTIONATED): Heparin Unfractionated: <0.1 IU/mL — ABNORMAL LOW (ref 0.30–0.70)

## 2017-07-19 LAB — LACTATE DEHYDROGENASE: LDH: 162 U/L (ref 98–192)

## 2017-07-19 LAB — VANCOMYCIN, TROUGH: VANCOMYCIN TR: 4 ug/mL — AB (ref 15–20)

## 2017-07-19 MED ORDER — VANCOMYCIN HCL IN DEXTROSE 1-5 GM/200ML-% IV SOLN
1000.0000 mg | Freq: Three times a day (TID) | INTRAVENOUS | Status: DC
  Administered 2017-07-19 – 2017-07-20 (×3): 1000 mg via INTRAVENOUS
  Filled 2017-07-19 (×4): qty 200

## 2017-07-19 MED ORDER — SODIUM CHLORIDE 0.9 % IV SOLN
Freq: Once | INTRAVENOUS | Status: AC
  Administered 2017-07-19: 11:00:00 via INTRAVENOUS

## 2017-07-19 MED ORDER — LIDOCAINE HCL (PF) 2 % IJ SOLN
INTRAMUSCULAR | Status: DC | PRN
  Administered 2017-07-19: 6 mL

## 2017-07-19 MED ORDER — LIDOCAINE 2% (20 MG/ML) 5 ML SYRINGE
INTRAMUSCULAR | Status: AC
  Filled 2017-07-19: qty 10

## 2017-07-19 NOTE — Progress Notes (Signed)
CRITICAL VALUE ALERT  Critical Value:  Hb 5.9  Date & Time Notied:  07/19/2017 0650  Provider Notified: Dr. Baltazar Najjar  Orders Received/Actions taken: Infuse 2units PRBC's

## 2017-07-19 NOTE — Progress Notes (Signed)
PULMONARY / CRITICAL CARE MEDICINE   Name: Shelley [name] MRN: [id number] DOB: 17-Jan-1946    ADMISSION DATE:  07/12/2017 CONSULTATION DATE:  07/12/2017  REFERRING MD:  Dr Bridgett Larsson  CHIEF COMPLAINT:  Left foot pain  SUBJECTIVE:  Not sure how she is breathing today. Seems confused.   VITAL SIGNS: BP 105/69   Pulse 94   Temp 97.8 F (36.6 C) (Oral)   Resp 20   Ht 5\' 5"  (1.651 m)   Wt 67.3 kg (148 lb 5.9 oz)   SpO2 98%   BMI 24.69 kg/m  High flow oxygen   hEMODYNAMICS:    VENTILATOR SETTINGS:    INTAKE / OUTPUT: I/O last 3 completed shifts: In: 3467.3 [P.O.:105; I.V.:3062.3; IV Piggyback:300] Out: 2510 [Urine:2510]  PHYSICAL EXAMINATION: General: Elderly female in NAD HEENT: Normocephalic atraumatic no jugular venous distention. Pulmonary: Normal effort. Diminished L, R scattered rales.  Cardiac: IRIR no MRG, mild edema.  Abdomen: Soft nontender no organomegaly positive bowel sounds Extremities: Status post left AKA  Pain is tolerable Neuro: Weak, moves extremities, oriented x2  LABS:  BMET Recent Labs  Lab 07/17/17 0335 07/18/17 0716 07/18/17 1611  NA 137 140 143  K 3.7 3.6 3.9  CL 106 107 110  CO2 23 22 26   BUN 13 13 12   CREATININE 0.56 0.55 0.58  GLUCOSE 142* 120* 145*   Electrolytes Recent Labs  Lab 07/13/17 0434  07/14/17 0748 07/15/17 0437  07/17/17 0335 07/18/17 0716 07/18/17 1611 07/19/17 0500  CALCIUM 8.7*   < > 8.1* 7.8*   < > 7.9* 7.8* 7.5*  --   MG 2.1  --  1.5* 1.8   < > 1.8 1.6*  --  2.1  PHOS 3.9  --  2.1* 3.0  --   --   --   --   --    < > = values in this interval not displayed.   CBC Recent Labs  Lab 07/17/17 0335 07/18/17 0716 07/19/17 0500  WBC 13.3* 15.3* 12.5*  HGB 9.6* 8.1* 5.9*  HCT 28.7* 24.5* 18.2*  PLT 146* 159 162   Coag's Recent Labs  Lab 07/13/17 0434  INR 1.18   Sepsis Markers Recent Labs  Lab 07/12/17 2328 07/13/17 0434 07/13/17 2326 07/17/17 1121  LATICACIDVEN 0.9 1.0 1.1  --   PROCALCITON   --   --   --  0.20   ABG Recent Labs  Lab 07/12/17 1711 07/12/17 1824  PHART 7.404 7.374  PCO2ART 24.8* 30.5*  PO2ART 76.0* 184.0*   Liver Enzymes Recent Labs  Lab 07/13/17 0434 07/17/17 0335 07/18/17 0716  AST 43* 28 28  ALT 20 17 17   ALKPHOS 100 191* 164*  BILITOT 1.2 1.1 1.0  ALBUMIN 3.4* 2.1* 2.0*   Cardiac Enzymes No results for input(s): TROPONINI, PROBNP in the last 168 hours.  Glucose Recent Labs  Lab 07/12/17 2054 07/14/17 2020 07/14/17 2348 07/15/17 0408  GLUCAP 121* 134* 114* 105*   Imaging Ct Chest Wo Contrast  Result Date: 07/18/2017 CLINICAL DATA:  Interstitial lung disease EXAM: CT CHEST WITHOUT CONTRAST TECHNIQUE: Multidetector CT imaging of the chest was performed following the standard protocol without IV contrast. COMPARISON:  None. FINDINGS: Cardiovascular: No significant vascular findings. Normal heart size. No pericardial effusion. Normal caliber thoracic aorta. Mild thoracic aortic atherosclerosis. Mediastinum/Nodes: No enlarged mediastinal or axillary lymph nodes. Thyroid gland, trachea, and esophagus demonstrate no significant findings. Lungs/Pleura: Large left pleural effusion with compressive atelectasis and overall volume loss. Small right pleural effusion.  Mild right basilar atelectasis. Airspace disease in the posterior segment of the right upper lobe most concerning for pneumonia. Spiculated 1.4 x 0.6 mm right upper lobe pulmonary nodule adjacent, partially contiguous, spiculated pulmonary nodule measuring 9 x 8 mm in the right upper lobe. Upper Abdomen: No acute abnormality. Musculoskeletal: No acute osseous abnormality. No aggressive osseous lesion. IMPRESSION: 1. Large left pleural effusion with compressive atelectasis and overall volume loss. 2. Small right pleural effusion.  Mild right basilar atelectasis. 3. Airspace disease in the posterior segment of the right upper lobe most concerning for pneumonia. 4. Spiculated 1.4 x 0.6 mm right upper  lobe pulmonary nodule adjacent, partially contiguous, spiculated pulmonary nodule measuring 9 x 8 mm in the right upper lobe. Malignancy is not excluded. PET-CT is recommended. Electronically Signed   By: Kathreen Devoid   On: 07/18/2017 14:39   STUDIES:  CXR 1/10 > RUL nodular density. CT chest 1/16 > Large left pleural effusion with compressive atelectasis and overall volume loss. Small right pleural effusion.  Mild right basilar atelectasis. Airspace disease in the posterior segment of the right upper lobe most concerning for pneumonia. Spiculated 1.4 x 0.6 mm right upper lobe pulmonary nodule adjacent, partially contiguous, spiculated pulmonary nodule measuring 9 x 8 mm in the right upper lobe.  CULTURES: None.  ANTIBIOTICS: Cefepime 1/14 Vancomycin 1/14  SIGNIFICANT EVENTS: 1/10 > admitted with left leg pain, likely ischemic from embolic phenomenon.  Taken to OR.  LINES/TUBES: L IJ CVL 1/10 >   DISCUSSION: 72 y.o. female admitted 1/10 with left leg pain.  Felt to have ischemia; therefore, taken to OR for attempts at thrombectomy.  In OR, found to have dead muscle in all LLE compartments. Underwent aputation. Post-operative course complicated by persistent hypoxemia. CT demonstrated consolidation/effusion. For thora 1/17.   ASSESSMENT / PLAN:  Acute Hypoxic Respiratory failure with ongoing high FiO2 needs. Progressive left-sided airspace disease most consistent with pneumonia/atelectasis -Cannot exclude aspiration event/hcap -Bedside ultrasound sound review 1/15 demonstrated very little pleural effusion certainly not enough to explain the degree of hypoxia and not enough volume to warrant thoracentesis -almost total collapse L CXR on 1/16. PCT low 1/15. -CT 1/17 with combination consolidation effusion on L occupying large amount of hemithorax.  Plan Continue high flow oxygen Aggressive pulmonary hygiene Thoracentesis scheduled for today via IR. Send fluid for cell count with  diff, culture, LDH, protein, gram stain, cytology. CT demonstrates more consolidation to me, so may not be large volume removed, but diagnostic information will be helpful.  In regards to possible pneumonia: Now on day #5 cefepime and vancomycin. Consider narrow to cefepime alone.  CT chest  RUL spiculated nodules Plan F/u PET as outpatient.   Tobacco dependence Plan Nicotine patch  Georgann Housekeeper, AGACNP-BC [facility] Pulmonology/Critical Care Pager [phone] or [phone]  07/19/2017 8:04 AM

## 2017-07-19 NOTE — Progress Notes (Signed)
PROGRESS NOTE    Shelley [name]  OZH:[id number] DOB: 05-30-1946 DOA: 07/12/2017 PCP: [name], MD   Brief Narrative:  72 y.o. WF  PMHx Alcohol abuse, Anxiety, CVA, Seizures, HTN, Osteoporosis Presents with cc: left foot pain.  Patient's memory and cognitive function is limited per her husband, so portions of this history are reconstructed from the EMR and the patient.  The patient is unclear when her left leg pain began.  She says a "few days ago."  The husband notes the patient has been complaining at night of left foot pain over the last two nights.  It is unclear whether the patient previously had any intermittent claudication or rest pain.  Over the last day reportedly she has had marked skin change in the left foot and calf with now paralysis and anesthesia.  She also notes a general sense of poor health with fatigue and repeated coughing.  .     Subjective: 1/17 patient's 4, negative CP, positive SOB, negative abdominal pain. Follows commands.    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 When patient stable and close to discharge will switch to DOAC. Cardiology prefers Apixaban  -Patient hemoglobin drops precipitously overnight have not located source of bleed yet would not restart anticoagulation until we determine exactly why/where patient's loss of hemoglobin occurred.  Chronic diastolic CHF? -Echocardiogram unable to determine diastolic function secondary to A. fib  -Strict in and out since admission +7.9L -Daily weight Filed Weights   07/12/17  1643 07/13/17 1741 07/14/17 0545  Weight: 152 lb 5.4 oz (69.1 kg) 152 lb 5.4 oz (69.1 kg) 148 lb 5.9 oz (67.3 kg)  -Transfuse for hemoglobin<8 -1/17 transfuse 2 units PRBC  Essential HTN/Hypotensive -Currently soft BP: Multifactorial A. fib RVR, post surgery -MAP goal> 65 -Midodrin 5 mg  Acute Hypoxic Respiratory failure -Titrate O2 to maintain SPO2 89-93%   HCAP -Complete empiric antibiotics  Large LEFT pleural effusion -DC heparin at midnight -Nothing by mouth midnight -1/17 IR will perform thoracentesis LEFT between units of PRBC   RUL nodular density. -CT angiogram: Spiculated mass 2., See results below PET- CT recommended. This will completed post outpatient.  -large left pleural effusion: See above    Tobacco dependence -nicotine patch  Altered mental status -Judicious use of narcotics and benzodiazepine with patient. Patient sensitive to narcotics.  Hx CVA  -  currently nonissue  AKI(baseline Cr<1) Recent Labs  Lab 07/14/17 0748 07/15/17 0437 07/16/17 0522 07/17/17 0335 07/18/17 0716 07/18/17 1611  CREATININE 0.60 0.57 0.56 0.56 0.55 0.58  - Baseline  Thrombocytopenia(baseline~180) -Multifactorial EtOH abuse, secondary to surgery -Resolved trending up (overnight patient dropped 2 units PRBC: Where?)  Anemia -No signs of overt bleeding however patient's hemoglobin dropped 2 units overnight. -Occult blood pending -LDH, Haptoglobin, peripheral blood smear pending -Although patient's abdomen and back not tender after completion of thoracentesis and blood transfusion may need to obtain CT abdomen pelvis retroperitoneal bleed? Examination patient's left AKA does not appear to be consistent with acute bleed.  LEFT AKA/EMBOLIC event LLE -Negative sign of infection, staple line intact.  Watch carefully for any possible signs of infection  EtOH abuse -DC CIWA protocol patient outside window for withdrawal.  Hypokalemia -Potassium goal>  4  Hypomagnesemia -Magnesium goal> 2   DVT prophylaxis: 1/17 On hold for procedure Code Status: Full Family Communication: None Disposition Plan: TBD   Consultants:  Vascular surgery [facility] M    Procedures/Significant Events:  1/10 PCXR:Persistent irregular nodular density in the right upper lobe  1/11 CT angiogram chest aorta W/W contrast:-There is abrupt occlusion of the left common femoral artery. The profunda femoral, superficial femoral, popliteal artery, and tibial arteries are also non-opacified in the left lower extremity. Diffuse vessel occlusion and thrombosis cannot be excluded. Given gas in the soft tissues of the left lower extremity, ischemia with necrotizing fasciitis and possible dead tissue are of clinical concern. -Occlusion of the anterior division of the left internal iliac artery. 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 - 2x spiculated masses  RUL Malignancy is not excluded. PET-CT is recommended. -Consolidation and collapse in the left lower lobe -Swelling and fullness of the musculature in the left leg worrisome for ischemia an impending compartment syndrome. 1/11- Left lower extremity embolectomy/ Left above knee amputation 1/11 Echocardiogram: LVEF; 65% to 70%. -Negative RWMA -Study was not technically sufficient to allow evaluation of LV diastolic dysfunction due to atrial   fibrillation. - Tricuspid valve mild-moderate regurgitation. - Pulmonic valve: There was trivial regurgitation. - Pulmonary arteries: PA peak pressure: 32 mm Hg (S). 1/16 PCXR: Near complete consolidation left lung with volume loss. There has been progression of consolidation and volume loss on the left compared to 2 days prior. Right lung clear except for mild scarring right upper lobe. Stable cardiac silhouette. No pneumothorax. There is aortic atherosclerosis. 1/16 CT chest W/O contrast:Large left pleural effusion with compressive atelectasis and overall volume loss. 2. Small  right pleural effusion.  Mild right basilar atelectasis. 3. Airspace disease in the posterior segment of the right upper lobe most concerning for pneumonia. 4. Spiculated 1.4 x 0.6 mm right upper lobe pulmonary nodule adjacent, partially contiguous, spiculated pulmonary nodule measuring 9 x 8 mm in the right upper lobe. Malignancy is not excluded. PET-CT is recommended.     I have personally reviewed and interpreted all radiology studies and my findings are as above.  VENTILATOR SETTINGS:   Cultures   Antimicrobials: Anti-infectives (From admission, onward)   Start     Stop   07/16/17 2200  ceFEPIme (MAXIPIME) 1 g in dextrose 5 % 50 mL IVPB     07/24/17 2159   07/16/17 2100  vancomycin (VANCOCIN) 500 mg in sodium chloride 0.9 % 100 mL IVPB         07/13/17 1845  ceFAZolin (ANCEF) IVPB 2g/100 mL premix     07/13/17 1840   07/13/17 1817  ceFAZolin (ANCEF) 2-4 GM/100ML-% IVPB    Comments:  Grace Blight   : cabinet override   07/13/17 1840       Devices    LINES / TUBES:      Continuous Infusions: . sodium chloride 75 mL/hr at 07/18/17 1647  . sodium chloride    . ceFEPime (MAXIPIME) IV 1 g (07/19/17 0552)  . vancomycin 500 mg (07/18/17 2100)     Objective: Vitals:   07/19/17 0630 07/19/17 0730 07/19/17 0749 07/19/17 0800  BP: 105/69 93/62  100/61  Pulse: 94 86  89  Resp: 20 17  (!) 22  Temp:   97.8 F (36.6 C)   TempSrc:   Oral  SpO2: 98% 96%  96%  Weight:      Height:        Intake/Output Summary (Last 24 hours) at 07/19/2017 0833 Last data filed at 07/19/2017 0600 Gross per 24 hour  Intake 2205.33 ml  Output 2060 ml  Net 145.33 ml   Filed Weights   07/12/17 1643 07/13/17 1741 07/14/17 0545  Weight: 152 lb 5.4 oz (69.1 kg) 152 lb 5.4 oz (69.1 kg) 148 lb 5.9 oz (67.3 kg)    Physical Exam:  General: A/O 4, positive acute respiratory distress Neck:  Negative scars, masses, torticollis, lymphadenopathy, JVD Lungs: right lung fields  rhonchorous, left lung fields breath sounds, Negative Wheezes or Crackles.  Cardiovascular: Tachycardic, Regular rhythm without murmur gallop or rub normal S1 and S2 Abdomen: negative abdominal pain, nondistended, positive soft, bowel sounds, no rebound, no ascites, no appreciable mass Extremities: No significant cyanosis, clubbing R LE. LEFT AKA staple line clean negative sign of infection, stump soft not consistent with bleed negative sign of infection Skin: Negative rashes, lesions, ulcers Psychiatric:  Negative depression, negative anxiety, negative fatigue, negative mania  Central nervous system:  Cranial nerves II through XII intact, tongue/uvula midline, all extremities muscle strength 5/5, sensation intact throughout, negative dysarthria, negative expressive aphasia, negative receptive aphasia.   Data Reviewed: Care during the described time interval was provided by me .  I have reviewed this patient's available data, including medical history, events of note, physical examination, and all test results as part of my evaluation.   CBC: Recent Labs  Lab 07/12/17 1545  07/15/17 0437 07/16/17 0522 07/17/17 0335 07/18/17 0716 07/19/17 0500  WBC 13.6*   < > 11.1* 10.6* 13.3* 15.3* 12.5*  NEUTROABS 10.4*  --   --   --   --   --   --   HGB 16.1*   < > 10.9* 10.4* 9.6* 8.1* 5.9*  HCT 48.4*   < > 33.1* 31.9* 28.7* 24.5* 18.2*  MCV 92.0   < > 91.7 92.2 92.0 94.6 92.9  PLT 176   < > 103* 114* 146* 159 162   < > = values in this interval not displayed.   Basic Metabolic Panel: Recent Labs  Lab 07/12/17 2230 07/13/17 0434  07/14/17 0748 07/15/17 0437 07/16/17 0522 07/17/17 0335 07/18/17 0716 07/18/17 1611 07/19/17 0500  NA  --  136   < > 137 139 137 137 140 143  --   K  --  4.0   < > 3.7 3.8 3.6 3.7 3.6 3.9  --   CL  --  107   < > 111 110 107 106 107 110  --   CO2  --  17*   < > 18* 22 22 23 22 26   --   GLUCOSE  --  178*   < > 143* 114* 107* 142* 120* 145*  --   BUN  --  27*    < > 17 14 11 13 13 12   --   CREATININE  --  0.78   < > 0.60 0.57 0.56 0.56 0.55 0.58  --   CALCIUM  --  8.7*   < > 8.1* 7.8* 8.0* 7.9* 7.8* 7.5*  --   MG 1.6* 2.1  --  1.5* 1.8 1.5* 1.8 1.6*  --  2.1  PHOS 4.0 3.9  --  2.1* 3.0  --   --   --   --   --    < > = values in this interval  not displayed.   GFR: Estimated Creatinine Clearance: 57.2 mL/min (by C-G formula based on SCr of 0.58 mg/dL). Liver Function Tests: Recent Labs  Lab 07/13/17 0434 07/17/17 0335 07/18/17 0716  AST 43* 28 28  ALT 20 17 17   ALKPHOS 100 191* 164*  BILITOT 1.2 1.1 1.0  PROT 6.0* 4.3* 4.3*  ALBUMIN 3.4* 2.1* 2.0*   No results for input(s): LIPASE, AMYLASE in the last 168 hours. Recent Labs  Lab 07/17/17 0335  AMMONIA 29   Coagulation Profile: Recent Labs  Lab 07/13/17 0434  INR 1.18   Cardiac Enzymes: Recent Labs  Lab 07/13/17 0434  CKTOTAL 2,279*   BNP (last 3 results) No results for input(s): PROBNP in the last 8760 hours. HbA1C: No results for input(s): HGBA1C in the last 72 hours. CBG: Recent Labs  Lab 07/12/17 2054 07/14/17 2020 07/14/17 2348 07/15/17 0408  GLUCAP 121* 134* 114* 105*   Lipid Profile: No results for input(s): CHOL, HDL, LDLCALC, TRIG, CHOLHDL, LDLDIRECT in the last 72 hours. Thyroid Function Tests: No results for input(s): TSH, T4TOTAL, FREET4, T3FREE, THYROIDAB in the last 72 hours. Anemia Panel: No results for input(s): VITAMINB12, FOLATE, FERRITIN, TIBC, IRON, RETICCTPCT in the last 72 hours. Urine analysis:    Component Value Date/Time   COLORURINE YELLOW 06/08/2017 2055   APPEARANCEUR HAZY (A) 06/08/2017 2055   LABSPEC 1.014 06/08/2017 2055   PHURINE 7.0 06/08/2017 2055   GLUCOSEU NEGATIVE 06/08/2017 2055   HGBUR NEGATIVE 06/08/2017 2055   BILIRUBINUR NEGATIVE 06/08/2017 2055   KETONESUR 20 (A) 06/08/2017 2055   PROTEINUR NEGATIVE 06/08/2017 2055   UROBILINOGEN 0.2 06/14/2010 0741   NITRITE NEGATIVE 06/08/2017 2055   LEUKOCYTESUR NEGATIVE  06/08/2017 2055   Sepsis Labs: @LABRCNTIP (procalcitonin:4,lacticidven:4)  ) Recent Results (from the past 240 hour(s))  MRSA PCR Screening     Status: None   Collection Time: 07/12/17 11:20 PM  Result Value Ref Range Status   MRSA by PCR NEGATIVE NEGATIVE Final    Comment:        The GeneXpert MRSA Assay (FDA approved for NASAL specimens only), is one component of a comprehensive MRSA colonization surveillance program. It is not intended to diagnose MRSA infection nor to guide or monitor treatment for MRSA infections.   Surgical pcr screen     Status: None   Collection Time: 07/13/17 11:15 AM  Result Value Ref Range Status   MRSA, PCR NEGATIVE NEGATIVE Final   Staphylococcus aureus NEGATIVE NEGATIVE Final    Comment: (NOTE) The Xpert SA Assay (FDA approved for NASAL specimens in patients 76 years of age and older), is one component of a comprehensive surveillance program. It is not intended to diagnose infection nor to guide or monitor treatment.   Culture, blood (routine x 2) Call MD if unable to obtain prior to antibiotics being given     Status: None (Preliminary result)   Collection Time: 07/17/17  3:35 AM  Result Value Ref Range Status   Specimen Description BLOOD LEFT HAND  Final   Special Requests IN PEDIATRIC BOTTLE Blood Culture adequate volume  Final   Culture NO GROWTH 2 DAYS  Final   Report Status PENDING  Incomplete  Culture, blood (routine x 2) Call MD if unable to obtain prior to antibiotics being given     Status: None (Preliminary result)   Collection Time: 07/17/17  3:36 AM  Result Value Ref Range Status   Specimen Description BLOOD LEFT HAND  Final   Special Requests IN PEDIATRIC BOTTLE Blood  Culture adequate volume  Final   Culture NO GROWTH 2 DAYS  Final   Report Status PENDING  Incomplete         Radiology Studies: Ct Chest Wo Contrast  Result Date: 07/18/2017 CLINICAL DATA:  Interstitial lung disease EXAM: CT CHEST WITHOUT CONTRAST  TECHNIQUE: Multidetector CT imaging of the chest was performed following the standard protocol without IV contrast. COMPARISON:  None. FINDINGS: Cardiovascular: No significant vascular findings. Normal heart size. No pericardial effusion. Normal caliber thoracic aorta. Mild thoracic aortic atherosclerosis. Mediastinum/Nodes: No enlarged mediastinal or axillary lymph nodes. Thyroid gland, trachea, and esophagus demonstrate no significant findings. Lungs/Pleura: Large left pleural effusion with compressive atelectasis and overall volume loss. Small right pleural effusion. Mild right basilar atelectasis. Airspace disease in the posterior segment of the right upper lobe most concerning for pneumonia. Spiculated 1.4 x 0.6 mm right upper lobe pulmonary nodule adjacent, partially contiguous, spiculated pulmonary nodule measuring 9 x 8 mm in the right upper lobe. Upper Abdomen: No acute abnormality. Musculoskeletal: No acute osseous abnormality. No aggressive osseous lesion. IMPRESSION: 1. Large left pleural effusion with compressive atelectasis and overall volume loss. 2. Small right pleural effusion.  Mild right basilar atelectasis. 3. Airspace disease in the posterior segment of the right upper lobe most concerning for pneumonia. 4. Spiculated 1.4 x 0.6 mm right upper lobe pulmonary nodule adjacent, partially contiguous, spiculated pulmonary nodule measuring 9 x 8 mm in the right upper lobe. Malignancy is not excluded. PET-CT is recommended. Electronically Signed   By: Kathreen Devoid   On: 07/18/2017 14:39   Dg Chest Port 1 View  Result Date: 07/18/2017 CLINICAL DATA:  Shortness of Breath EXAM: PORTABLE CHEST 1 VIEW COMPARISON:  July 16, 2017 FINDINGS: Central catheter tip is at the junction of the left innominate vein and superior vena cava. No pneumothorax. There is extensive consolidation throughout the left lung with volume loss, increased from 2 days prior. There is persistent elevation of the left  hemidiaphragm. Right lung is clear except for mild scarring in the right upper lobe. Heart size is normal. There is aortic atherosclerosis. No adenopathy evident. Visualized pulmonary vascularity is unremarkable. IMPRESSION: Near complete consolidation left lung with volume loss. There has been progression of consolidation and volume loss on the left compared to 2 days prior. Right lung clear except for mild scarring right upper lobe. Stable cardiac silhouette. No pneumothorax. There is aortic atherosclerosis. Aortic Atherosclerosis (ICD10-I70.0). Electronically Signed   By: Lowella Grip III M.D.   On: 07/18/2017 07:59        Scheduled Meds: . atorvastatin  20 mg Oral Daily  . chlorhexidine  15 mL Mouth Rinse BID  . Chlorhexidine Gluconate Cloth  6 each Topical Daily  . diltiazem  60 mg Oral Q6H  . feeding supplement (ENSURE ENLIVE)  237 mL Oral TID BM  . folic acid  1 mg Oral Daily  . levalbuterol  0.63 mg Nebulization Q6H  . levETIRAcetam  500 mg Oral BID  . magnesium oxide  400 mg Oral BID  . mouth rinse  15 mL Mouth Rinse q12n4p  . midodrine  5 mg Oral TID WC  . multivitamin with minerals  1 tablet Oral Daily  . nicotine  21 mg Transdermal Daily  . sodium chloride flush  10-40 mL Intracatheter Q12H  . sodium chloride HYPERTONIC  4 mL Nebulization Daily  . thiamine  100 mg Oral Daily   Continuous Infusions: . sodium chloride 75 mL/hr at 07/18/17 1647  . sodium  chloride    . ceFEPime (MAXIPIME) IV 1 g (07/19/17 0552)  . vancomycin 500 mg (07/18/17 2100)     LOS: 7 days    Time spent: 40 minutes    Sharmayne Jablon, Geraldo Docker, MD [facility] Pager [phone]   If 7PM-7AM, please contact night-coverage [email or web address] Password [facility] 07/19/2017, 8:33 AM

## 2017-07-19 NOTE — Progress Notes (Signed)
   Daily Progress Note   Assessment/Planning:   POD #6 s/p L fem TE, L AKA for dead L foot due to likely embolism to L leg   Neither the left groin nor AKA demonstrates significant bleeding, so I doubt either is the cause of the patient's significant drop in H/H  Will check on patient on Monday  Dr. Trula Slade will be covering my service   Subjective  - 6 Days Post-Op   Confused, getting 2 u pRBC   Objective   Vitals:   07/19/17 0800 07/19/17 0900 07/19/17 0930 07/19/17 1000  BP: 100/61 100/74 105/70 101/60  Pulse: 89 91 90 94  Resp: (!) 22 18 20 17   Temp:      TempSrc:      SpO2: 96% 100% 93% 97%  Weight:      Height:         Intake/Output Summary (Last 24 hours) at 07/19/2017 1045 Last data filed at 07/19/2017 1030 Gross per 24 hour  Intake 2365.33 ml  Output 1935 ml  Net 430.33 ml   VASC: Viable L AKA with some serosang drainage on dressing, L groin c/d/i, mild echymosis, no ballotable fluid in groin or AKA  Laboratory   CBC CBC Latest Ref Rng & Units 07/19/2017 07/19/2017 07/18/2017  WBC 4.0 - 10.5 K/uL - 12.5(H) 15.3(H)  Hemoglobin 12.0 - 15.0 g/dL 6.4(LL) 5.9(LL) 8.1(L)  Hematocrit 36.0 - 46.0 % 19.6(L) 18.2(L) 24.5(L)  Platelets 150 - 400 K/uL - 162 159    BMET    Component Value Date/Time   NA 143 07/18/2017 1611   K 3.9 07/18/2017 1611   CL 110 07/18/2017 1611   CO2 26 07/18/2017 1611   GLUCOSE 145 (H) 07/18/2017 1611   BUN 12 07/18/2017 1611   CREATININE 0.58 07/18/2017 1611   CALCIUM 7.5 (L) 07/18/2017 1611   GFRNONAA >60 07/18/2017 1611   GFRAA >60 07/18/2017 1611     Adele Barthel, MD, FACS [facility] Office: [phone] Pager: [phone]  07/19/2017, 10:45 AM

## 2017-07-19 NOTE — Progress Notes (Signed)
SLP Cancellation Note  Patient Details Name: Shelley [name] MRN: [id number] DOB: 02/13/1946   Cancelled treatment:       Reason Eval/Treat Not Completed: Medical issues which prohibited therapy. Pt NPO pending possible procedure today. Will f/u as able. Per RN, pt with limited intake since evaluation yesterday but without overt signs of aspiration.    Germain Osgood 07/19/2017, 9:00 AM  Germain Osgood, M.A. CCC-SLP [phone]

## 2017-07-19 NOTE — Progress Notes (Signed)
Pharmacy Antibiotic Note  Shelley [name] is a 72 y.o. female admitted on 07/12/2017 with pneumonia.  Pharmacy managing vancomycin and cefepime dosing. WBC trending down to 12.5 today. An 11-hr vanc trough today was subtherapeutic at 4. Patient remains culture negative. A chest CT yesterday showed persistent airspace disease concerning for right upper lobe pna.   1/15 BCx>>ngtd  1/10 MRSA PCR neg   Plan: Continue cefepime 1 gram iv Q 8 hours Increase vancomycin to 1 gm IV Q 8 hours.   Height: 5\' 5"  (165.1 cm) Weight: 148 lb 5.9 oz (67.3 kg) IBW/kg (Calculated) : 57  Temp (24hrs), Avg:97.9 F (36.6 C), Min:97.5 F (36.4 C), Max:98.5 F (36.9 C)  Recent Labs  Lab 07/12/17 1554  07/12/17 2328 07/13/17 0434 07/13/17 2326  07/15/17 0437 07/16/17 0522 07/17/17 0335 07/18/17 0716 07/18/17 1611 07/19/17 0500 07/19/17 0804  WBC  --    < >  --   --  12.4*   < > 11.1* 10.6* 13.3* 15.3*  --  12.5*  --   CREATININE  --    < >  --  0.78 0.65   < > 0.57 0.56 0.56 0.55 0.58  --   --   LATICACIDVEN 2.01*  --  0.9 1.0 1.1  --   --   --   --   --   --   --   --   VANCOTROUGH  --   --   --   --   --   --   --   --   --   --   --   --  4*   < > = values in this interval not displayed.    Estimated Creatinine Clearance: 57.2 mL/min (by C-G formula based on SCr of 0.58 mg/dL).    Allergies  Allergen Reactions  . Acyclovir And Related     Thank you for allowing pharmacy to be a part of this patient's care.  Albertina Parr, PharmD., BCPS Clinical Pharmacist Pager [phone]

## 2017-07-19 NOTE — Progress Notes (Signed)
Patient has gone down for procedure when coming to room to do afternoon CPT.

## 2017-07-19 NOTE — Progress Notes (Signed)
Physical Therapy Treatment Patient Details Name: Shelley [name] MRN: [id number] DOB: 05-17-1946 Today's Date: 07/19/2017    History of Present Illness Shelley [name] is a 72 y.o. female with PMH of Alcohol abuse, Anxiety, CVA (cerebral infarction), Hypertension, Osteoporosis, and Seizures and recent admission 06/08/17 for CVA. 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  Postoperatively had had respiratory failure requiring hi-flow O2 as well as A-fib w/ RVR. and post-operative anemia and hypotension.      PT Comments    Pt seen for mobility progression and tolerated sitting EOB ~15 minutes, progressing from max A to close min guard with one UE support. Pt limited with mobility secondary to low Hgb and pt's RN requesting bed level activity only. All VSS throughout. Pt would continue to benefit from skilled physical therapy services at this time while admitted and after d/c to address the below listed limitations in order to improve overall safety and independence with functional mobility.    Follow Up Recommendations  SNF;Supervision/Assistance - 24 hour     Equipment Recommendations  Wheelchair (measurements PT);Wheelchair cushion (measurements PT)    Recommendations for Other Services       Precautions / Restrictions Precautions Precautions: Fall Precaution Comments: L AKA Restrictions Weight Bearing Restrictions: Yes LLE Weight Bearing: Non weight bearing    Mobility  Bed Mobility Overal bed mobility: Needs Assistance Bed Mobility: Supine to Sit;Sit to Supine     Supine to sit: Max assist;+2 for physical assistance;HOB elevated Sit to supine: +2 for physical assistance;Total assist   General bed mobility comments: pt able to use bilateral UEs on therapist to elevate her trunk; pt required assistance with R LE and use of bed pads to position pt's hips at EOB  Transfers                  General transfer comment: pt's RN requesting bed level only this session secondary to low Hgb (5.9)  Ambulation/Gait                 Stairs            Wheelchair Mobility    Modified Rankin (Stroke Patients Only)       Balance Overall balance assessment: Needs assistance Sitting-balance support: Single extremity supported Sitting balance-Leahy Scale: Poor Sitting balance - Comments: pt progressing from requiring max A to close min guard; pt with initial posterior lean secondary to anxiety/fear of falling                                    Cognition Arousal/Alertness: Awake/alert Behavior During Therapy: WFL for tasks assessed/performed Overall Cognitive Status: Impaired/Different from baseline Area of Impairment: Orientation;Memory;Attention;Safety/judgement;Awareness;Problem solving                 Orientation Level: Situation;Time;Place Current Attention Level: Sustained Memory: Decreased short-term memory   Safety/Judgement: Decreased awareness of safety;Decreased awareness of deficits Awareness: Intellectual Problem Solving: Slow processing;Decreased initiation;Difficulty sequencing;Requires verbal cues;Requires tactile cues        Exercises      General Comments        Pertinent Vitals/Pain Pain Assessment: Faces Faces Pain Scale: Hurts little more Pain Location: L residual limb Pain Descriptors / Indicators: Grimacing;Guarding Pain Intervention(s): Monitored during session;Repositioned    Home Living  Prior Function            PT Goals (current goals can now be found in the care plan section) Acute Rehab PT Goals Patient Stated Goal: To get stronger PT Goal Formulation: With family Time For Goal Achievement: 07/31/17 Potential to Achieve Goals: Fair Progress towards PT goals: Progressing toward goals    Frequency    Min 3X/week      PT Plan Current plan remains  appropriate    Co-evaluation PT/OT/SLP Co-Evaluation/Treatment: Yes Reason for Co-Treatment: For patient/therapist safety;Complexity of the patient's impairments (multi-system involvement) PT goals addressed during session: Mobility/safety with mobility;Balance;Strengthening/ROM OT goals addressed during session: Strengthening/ROM      AM-PAC PT "6 Clicks" Daily Activity  Outcome Measure  Difficulty turning over in bed (including adjusting bedclothes, sheets and blankets)?: Unable Difficulty moving from lying on back to sitting on the side of the bed? : Unable Difficulty sitting down on and standing up from a chair with arms (e.g., wheelchair, bedside commode, etc,.)?: Unable Help needed moving to and from a bed to chair (including a wheelchair)?: Total Help needed walking in hospital room?: Total Help needed climbing 3-5 steps with a railing? : Total 6 Click Score: 6    End of Session Equipment Utilized During Treatment: Oxygen Activity Tolerance: Patient limited by pain;Patient limited by fatigue Patient left: in bed;with call bell/phone within reach;with bed alarm set Nurse Communication: Mobility status PT Visit Diagnosis: Muscle weakness (generalized) (M62.81);Difficulty in walking, not elsewhere classified (R26.2);Pain Pain - Right/Left: Left Pain - part of body: Leg     Time: 9024-0973 PT Time Calculation (min) (ACUTE ONLY): 37 min  Charges:  $Therapeutic Activity: 8-22 mins                    G Codes:       [place], [place], [place] 07/19/2017, 9:53 AM

## 2017-07-19 NOTE — Progress Notes (Signed)
Progress Note  Patient Name: Shelley [name] Date of Encounter: 07/19/2017  Primary Cardiologist: Cristopher Peru, MD   Subjective   No CP, no SOB. Friend in room Inpatient Medications    Scheduled Meds: . atorvastatin  20 mg Oral Daily  . chlorhexidine  15 mL Mouth Rinse BID  . Chlorhexidine Gluconate Cloth  6 each Topical Daily  . diltiazem  60 mg Oral Q6H  . feeding supplement (ENSURE ENLIVE)  237 mL Oral TID BM  . folic acid  1 mg Oral Daily  . levalbuterol  0.63 mg Nebulization Q6H  . levETIRAcetam  500 mg Oral BID  . magnesium oxide  400 mg Oral BID  . mouth rinse  15 mL Mouth Rinse q12n4p  . midodrine  5 mg Oral TID WC  . multivitamin with minerals  1 tablet Oral Daily  . nicotine  21 mg Transdermal Daily  . sodium chloride flush  10-40 mL Intracatheter Q12H  . sodium chloride HYPERTONIC  4 mL Nebulization Daily  . thiamine  100 mg Oral Daily   Continuous Infusions: . sodium chloride 75 mL/hr at 07/18/17 1647  . ceFEPime (MAXIPIME) IV 1 g (07/19/17 0552)  . vancomycin Stopped (07/19/17 1030)   PRN Meds: alum & mag hydroxide-simeth, metoprolol tartrate, morphine injection, ondansetron, oxyCODONE, polyethylene glycol, RESOURCE THICKENUP CLEAR, sodium chloride flush   Vital Signs    Vitals:   07/19/17 1130 07/19/17 1200 07/19/17 1223 07/19/17 1238  BP: 106/63 103/63  116/82  Pulse: 80 83  80  Resp: 20 20  (!) 21  Temp:   97.7 F (36.5 C) 98.2 F (36.8 C)  TempSrc:   Oral Oral  SpO2: 95% 96%  92%  Weight:      Height:        Intake/Output Summary (Last 24 hours) at 07/19/2017 1308 Last data filed at 07/19/2017 1239 Gross per 24 hour  Intake 2835 ml  Output 1820 ml  Net 1015 ml   Filed Weights   07/12/17 1643 07/13/17 1741 07/14/17 0545  Weight: 152 lb 5.4 oz (69.1 kg) 152 lb 5.4 oz (69.1 kg) 148 lb 5.9 oz (67.3 kg)    Telemetry    Atrial fibrillation heart rate under better control.  Currently 90.- Personally Reviewed  ECG    Atrial fibrillation  with rapid ventricular response- Personally Reviewed  Physical Exam   GEN: Well nourished, well developed, in no acute distress  HEENT: normal  Neck: no JVD, carotid bruits, or masses Cardiac: irreg; no murmurs, rubs, or gallops,no edema  Respiratory:  clear to auscultation bilaterally, normal work of breathing GI: soft, nontender, nondistended, + BS MS: no deformity or atrophy  Skin: warm and dry, no rash Neuro:  Alert but confused, Strength and sensation are intact Psych: euthymic mood, full affect    Labs    Chemistry Recent Labs  Lab 07/13/17 0434  07/17/17 0335 07/18/17 0716 07/18/17 1611  NA 136   < > 137 140 143  K 4.0   < > 3.7 3.6 3.9  CL 107   < > 106 107 110  CO2 17*   < > 23 22 26   GLUCOSE 178*   < > 142* 120* 145*  BUN 27*   < > 13 13 12   CREATININE 0.78   < > 0.56 0.55 0.58  CALCIUM 8.7*   < > 7.9* 7.8* 7.5*  PROT 6.0*  --  4.3* 4.3*  --   ALBUMIN 3.4*  --  2.1* 2.0*  --  AST 43*  --  28 28  --   ALT 20  --  17 17  --   ALKPHOS 100  --  191* 164*  --   BILITOT 1.2  --  1.1 1.0  --   GFRNONAA >60   < > >60 >60 >60  GFRAA >60   < > >60 >60 >60  ANIONGAP 12   < > 8 11 7    < > = values in this interval not displayed.     Hematology Recent Labs  Lab 07/17/17 0335 07/18/17 0716 07/19/17 0500 07/19/17 0923  WBC 13.3* 15.3* 12.5*  --   RBC 3.12* 2.59* 1.96*  --   HGB 9.6* 8.1* 5.9* 6.4*  HCT 28.7* 24.5* 18.2* 19.6*  MCV 92.0 94.6 92.9  --   MCH 30.8 31.3 30.1  --   MCHC 33.4 33.1 32.4  --   RDW 14.5 15.2 14.8  --   PLT 146* 159 162  --     Cardiac EnzymesNo results for input(s): TROPONINI in the last 168 hours.  Recent Labs  Lab 07/12/17 1552  TROPIPOC 0.03     BNPNo results for input(s): BNP, PROBNP in the last 168 hours.   DDimer No results for input(s): DDIMER in the last 168 hours.   Radiology    Ct Chest Wo Contrast  Result Date: 07/18/2017 CLINICAL DATA:  Interstitial lung disease EXAM: CT CHEST WITHOUT CONTRAST TECHNIQUE:  Multidetector CT imaging of the chest was performed following the standard protocol without IV contrast. COMPARISON:  None. FINDINGS: Cardiovascular: No significant vascular findings. Normal heart size. No pericardial effusion. Normal caliber thoracic aorta. Mild thoracic aortic atherosclerosis. Mediastinum/Nodes: No enlarged mediastinal or axillary lymph nodes. Thyroid gland, trachea, and esophagus demonstrate no significant findings. Lungs/Pleura: Large left pleural effusion with compressive atelectasis and overall volume loss. Small right pleural effusion. Mild right basilar atelectasis. Airspace disease in the posterior segment of the right upper lobe most concerning for pneumonia. Spiculated 1.4 x 0.6 mm right upper lobe pulmonary nodule adjacent, partially contiguous, spiculated pulmonary nodule measuring 9 x 8 mm in the right upper lobe. Upper Abdomen: No acute abnormality. Musculoskeletal: No acute osseous abnormality. No aggressive osseous lesion. IMPRESSION: 1. Large left pleural effusion with compressive atelectasis and overall volume loss. 2. Small right pleural effusion.  Mild right basilar atelectasis. 3. Airspace disease in the posterior segment of the right upper lobe most concerning for pneumonia. 4. Spiculated 1.4 x 0.6 mm right upper lobe pulmonary nodule adjacent, partially contiguous, spiculated pulmonary nodule measuring 9 x 8 mm in the right upper lobe. Malignancy is not excluded. PET-CT is recommended. Electronically Signed   By: Kathreen Devoid   On: 07/18/2017 14:39   Dg Chest Port 1 View  Result Date: 07/18/2017 CLINICAL DATA:  Shortness of Breath EXAM: PORTABLE CHEST 1 VIEW COMPARISON:  July 16, 2017 FINDINGS: Central catheter tip is at the junction of the left innominate vein and superior vena cava. No pneumothorax. There is extensive consolidation throughout the left lung with volume loss, increased from 2 days prior. There is persistent elevation of the left hemidiaphragm. Right  lung is clear except for mild scarring in the right upper lobe. Heart size is normal. There is aortic atherosclerosis. No adenopathy evident. Visualized pulmonary vascularity is unremarkable. IMPRESSION: Near complete consolidation left lung with volume loss. There has been progression of consolidation and volume loss on the left compared to 2 days prior. Right lung clear except for mild scarring right upper lobe.  Stable cardiac silhouette. No pneumothorax. There is aortic atherosclerosis. Aortic Atherosclerosis (ICD10-I70.0). Electronically Signed   By: Lowella Grip III M.D.   On: 07/18/2017 07:59    Cardiac Studies   Echocardiogram-normal LV function, trace AI, mildly dilated aortic root, mild to moderate tricuspid regurgitation.  Patient Profile     72 y.o. female recent stroke, embolic event to left lower extremity requiring amputation.  Atrial fibrillation.  Assessment & Plan    Persistent atrial fibrillation -doing well on PO dilt 60 Q6  - tomorrow consolidate to 712 CD QD  Embolic event lower extremity requiring amputation -Vascular surgery, stable, left AKA, no change  Essential hypertension -Previous pressor use.  Still remains slightly low.  Improved however. No change  Acute hypoxic respiratory failure/ ?PNA -  Pulmonary toilet., VANC, CEF, stable  Right upper lobe nodular lung mass -Spiculated mass x2-PET scan recommended as outpatient. Pulmonary, no change   Tobacco use -Continue to encourage cessation.  No change  Alcohol use -Protocol in place, CIWA    For questions or updates, please contact [place] Please consult [email or web address] for contact info under Cardiology/STEMI.      Signed, Candee Furbish, MD  07/19/2017, 1:08 PM

## 2017-07-19 NOTE — Progress Notes (Signed)
ANTICOAGULATION CONSULT NOTE - Follow Up Consult  Pharmacy Consult for Heparin Indication: atrial fibrillation  Allergies  Allergen Reactions  . Acyclovir And Related    Patient Measurements: Height: 5\' 5"  (165.1 cm) Weight: 148 lb 5.9 oz (67.3 kg) IBW/kg (Calculated) : 57 Heparin Dosing Weight: 69 kg  Vital Signs: Temp: 97.8 F (36.6 C) (01/17 0749) Temp Source: Oral (01/17 0749) BP: 100/61 (01/17 0800) Pulse Rate: 89 (01/17 0800)  Labs: Recent Labs    07/17/17 0335 07/17/17 1121 07/18/17 0716 07/18/17 1611 07/19/17 0500  HGB 9.6*  --  8.1*  --  5.9*  HCT 28.7*  --  24.5*  --  18.2*  PLT 146*  --  159  --  162  HEPARINUNFRC 0.26* 0.37 0.35  --  <0.10*  CREATININE 0.56  --  0.55 0.58  --    Estimated Creatinine Clearance: 57.2 mL/min (by C-G formula based on SCr of 0.58 mg/dL).  Assessment: CC/HPI: leg pain, claudication  PMH: tobacco, HTN, EtOH, anxiety, seizure, embolic CVA   Anticoag: Heparin for new-onset of Afib causing thrombosis, left leg discoloration s/p thrombectomy 1/10, recent CVA on 06/08/17 -s/p L femoral TE in addition to L AKA  -resumed full dose 1/12 am per dr Donzetta Matters  Heparin is now on hold for planned thoracentesis today in IR.   Renal: SCr 0.58  Heme/Onc: H&H 5.9/18.2 (transfusing today), Plt 162  Goal of Therapy:  Heparin level 0.3-0.7 units/ml Monitor platelets by anticoagulation protocol: Yes   Plan:  Continue to hold IV heparin. F/u plans for resuming AC post procedure  F/U long-term [facility] plans  Albertina Parr, PharmD., BCPS Clinical Pharmacist Pager [phone]

## 2017-07-19 NOTE — Procedures (Signed)
PROCEDURE SUMMARY:  Successful US guided left thoracentesis. Yielded 550 mL of hazy, amber colored fluid. Pt tolerated procedure well. No immediate complications.  Specimen was sent for labs. CXR ordered.  Ascencion Dike PA-C 07/19/2017 4:07 PM

## 2017-07-19 NOTE — Progress Notes (Signed)
Occupational Therapy Treatment Patient Details Name: Shelley [name] MRN: [id number] DOB: [birth year] Today's Date: 07/19/2017    History of present illness [place] JON is a 72 y.o. female with PMH of Alcohol abuse, Anxiety, CVA (cerebral infarction), Hypertension, Osteoporosis, and Seizures and recent admission 06/08/17 for CVA. 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  Postoperatively had had respiratory failure requiring hi-flow O2 as well as A-fib w/ RVR. and post-operative anemia and hypotension.     OT comments  Pt with low hgb, so limited activity to sitting EOB. Pt with good tolerance of activity with VSS throughout. Pt progressed from max assist to min guard with one hand support with sitting EOB. She continues to be confused.   Follow Up Recommendations  SNF;Supervision/Assistance - 24 hour    Equipment Recommendations       Recommendations for Other Services      Precautions / Restrictions Precautions Precautions: Fall Precaution Comments: L AKA Restrictions Weight Bearing Restrictions: Yes LLE Weight Bearing: (AKA)       Mobility Bed Mobility Overal bed mobility: Needs Assistance Bed Mobility: Supine to Sit;Sit to Supine     Supine to sit: Max assist;+2 for physical assistance;HOB elevated Sit to supine: +2 for physical assistance;Total assist   General bed mobility comments: pt able to assist pulling up on PT's hands and R LE over EOB, assist to raise trunk and position hips at EOB, pt fearful of falling responding with posterior lean intially  Transfers                 General transfer comment: unable due to low hgb    Balance Overall balance assessment: Needs assistance Sitting-balance support: Feet supported;Single extremity supported Sitting balance-Leahy Scale: Fair Sitting balance - Comments: initially requiring max assist, progressed to min guard with  one hand on bed, sat x 15 minutes.                                   ADL either performed or assessed with clinical judgement   ADL Overall ADL's : Needs assistance/impaired     Grooming: Wash/dry hands;Set up;Bed level               Lower Body Dressing: Total assistance;Bed level                       Vision       Perception     Praxis      Cognition Arousal/Alertness: Awake/alert Behavior During Therapy: WFL for tasks assessed/performed Overall Cognitive Status: Impaired/Different from baseline Area of Impairment: Orientation;Memory;Attention;Safety/judgement;Awareness;Problem solving                 Orientation Level: Situation;Time;Place Current Attention Level: Sustained Memory: Decreased short-term memory   Safety/Judgement: Decreased awareness of safety;Decreased awareness of deficits Awareness: Intellectual Problem Solving: Slow processing;Decreased initiation;Difficulty sequencing;Requires verbal cues;Requires tactile cues          Exercises     Shoulder Instructions       General Comments      Pertinent Vitals/ Pain       Pain Assessment: Faces Faces Pain Scale: Hurts little more Pain Location: LLE Pain Descriptors / Indicators: Discomfort Pain Intervention(s): Monitored during session;Repositioned  Home Living  Prior Functioning/Environment              Frequency  Min 2X/week        Progress Toward Goals  OT Goals(current goals can now be found in the care plan section)  Progress towards OT goals: Progressing toward goals  Acute Rehab OT Goals Patient Stated Goal: To get stronger OT Goal Formulation: With patient/family Time For Goal Achievement: 07/31/17 Potential to Achieve Goals: [place] Discharge plan remains appropriate    Co-evaluation    PT/OT/SLP Co-Evaluation/Treatment: Yes Reason for Co-Treatment: For  patient/therapist safety   OT goals addressed during session: Strengthening/ROM      AM-PAC PT "6 Clicks" Daily Activity     Outcome Measure   Help from another person eating meals?: A Little Help from another person taking care of personal grooming?: A Little Help from another person toileting, which includes using toliet, bedpan, or urinal?: Total Help from another person bathing (including washing, rinsing, drying)?: A Lot Help from another person to put on and taking off regular upper body clothing?: A Lot Help from another person to put on and taking off regular lower body clothing?: Total 6 Click Score: 12    End of Session Equipment Utilized During Treatment: Oxygen  OT Visit Diagnosis: Other abnormalities of gait and mobility (R26.89);Muscle weakness (generalized) (M62.81);Other symptoms and signs involving cognitive function;Pain Pain - Right/Left: Left Pain - part of body: Leg   Activity Tolerance Treatment limited secondary to medical complications (Comment)(low hgb)   Patient Left in bed;with call bell/phone within reach;with bed alarm set   Nurse Communication Mobility status        Time: 1540-0867 OT Time Calculation (min): 34 min  Charges: OT General Charges $OT Visit: 1 Visit OT Treatments $Therapeutic Activity: 8-22 mins  07/19/2017 Nestor Lewandowsky, OTR/L Pager: [phone]   Werner Lean Haze [place] 07/19/2017, 9:44 AM

## 2017-07-20 ENCOUNTER — Inpatient Hospital Stay (HOSPITAL_COMMUNITY): Payer: Medicare Other

## 2017-07-20 LAB — BPAM RBC
BLOOD PRODUCT EXPIRATION DATE: 201901222359
Blood Product Expiration Date: 201901182359
ISSUE DATE / TIME: 201901171039
ISSUE DATE / TIME: 201901171325
UNIT TYPE AND RH: 6200
Unit Type and Rh: 6200

## 2017-07-20 LAB — MAGNESIUM: Magnesium: 1.8 mg/dL (ref 1.7–2.4)

## 2017-07-20 LAB — TYPE AND SCREEN
ABO/RH(D): A POS
ANTIBODY SCREEN: NEGATIVE
UNIT DIVISION: 0
UNIT DIVISION: 0

## 2017-07-20 LAB — CBC
HEMATOCRIT: 30.2 % — AB (ref 36.0–46.0)
Hemoglobin: 9.9 g/dL — ABNORMAL LOW (ref 12.0–15.0)
MCH: 28.2 pg (ref 26.0–34.0)
MCHC: 32.8 g/dL (ref 30.0–36.0)
MCV: 86 fL (ref 78.0–100.0)
PLATELETS: 100 10*3/uL — AB (ref 150–400)
RBC: 3.51 MIL/uL — ABNORMAL LOW (ref 3.87–5.11)
RDW: 20.8 % — AB (ref 11.5–15.5)
WBC: 17.7 10*3/uL — AB (ref 4.0–10.5)

## 2017-07-20 LAB — POCT I-STAT 3, ART BLOOD GAS (G3+)
Acid-base deficit: 2 mmol/L (ref 0.0–2.0)
BICARBONATE: 22.6 mmol/L (ref 20.0–28.0)
O2 Saturation: 90 %
PCO2 ART: 37.1 mmHg (ref 32.0–48.0)
PO2 ART: 58 mmHg — AB (ref 83.0–108.0)
TCO2: 24 mmol/L (ref 22–32)
pH, Arterial: 7.392 (ref 7.350–7.450)

## 2017-07-20 LAB — AMYLASE, PLEURAL OR PERITONEAL FLUID: Amylase, Fluid: 8 U/L

## 2017-07-20 LAB — TRIGLYCERIDES, BODY FLUIDS: Triglycerides, Fluid: 17 mg/dL

## 2017-07-20 LAB — HAPTOGLOBIN: HAPTOGLOBIN: 281 mg/dL — AB (ref 34–200)

## 2017-07-20 LAB — PH, BODY FLUID: pH, Body Fluid: 7.7

## 2017-07-20 MED ORDER — LORAZEPAM 2 MG/ML IJ SOLN: 0.5000 mg | INTRAMUSCULAR | Status: DC | PRN

## 2017-07-20 MED ORDER — SODIUM CHLORIDE 0.9 % IV SOLN: Freq: Once | INTRAVENOUS | Status: DC

## 2017-07-20 MED ORDER — DILTIAZEM HCL 25 MG/5ML IV SOLN
10.0000 mg | Freq: Once | INTRAVENOUS | Status: AC
  Administered 2017-07-20: 10 mg via INTRAVENOUS
  Filled 2017-07-20: qty 5

## 2017-07-20 MED ORDER — FUROSEMIDE 10 MG/ML IJ SOLN
40.0000 mg | Freq: Once | INTRAMUSCULAR | Status: AC
  Administered 2017-07-20: 40 mg via INTRAVENOUS
  Filled 2017-07-20: qty 4

## 2017-07-20 MED ORDER — HALOPERIDOL LACTATE 5 MG/ML IJ SOLN
2.5000 mg | Freq: Four times a day (QID) | INTRAMUSCULAR | Status: DC | PRN
  Administered 2017-07-20: 2.5 mg via INTRAVENOUS
  Filled 2017-07-20: qty 1

## 2017-07-20 MED ORDER — LEVETIRACETAM 500 MG PO TABS
500.0000 mg | ORAL_TABLET | Freq: Two times a day (BID) | ORAL | Status: DC
  Administered 2017-07-20 – 2017-07-21 (×3): 500 mg via ORAL
  Filled 2017-07-20 (×4): qty 1

## 2017-07-20 MED ORDER — LORAZEPAM 2 MG/ML IJ SOLN
1.0000 mg | INTRAMUSCULAR | Status: DC | PRN
  Administered 2017-07-24 – 2017-07-28 (×2): 1 mg via INTRAVENOUS
  Filled 2017-07-20 (×2): qty 1

## 2017-07-20 MED ORDER — HALOPERIDOL LACTATE 5 MG/ML IJ SOLN: 2.0000 mg | Freq: Four times a day (QID) | INTRAMUSCULAR | Status: DC | PRN

## 2017-07-20 MED ORDER — SODIUM CHLORIDE 0.9 % IV SOLN
500.0000 mg | Freq: Two times a day (BID) | INTRAVENOUS | Status: DC
  Administered 2017-07-20: 500 mg via INTRAVENOUS
  Filled 2017-07-20 (×2): qty 5

## 2017-07-20 MED ORDER — METOPROLOL TARTRATE 5 MG/5ML IV SOLN
2.0000 mg | INTRAVENOUS | Status: DC | PRN
  Administered 2017-07-20: 5 mg via INTRAVENOUS
  Filled 2017-07-20: qty 5

## 2017-07-20 NOTE — Progress Notes (Signed)
Pt refusing to take medications. Educated about the indications of each medication that was refused. Pt still refused. MD McClung notifed.

## 2017-07-20 NOTE — Progress Notes (Signed)
RT NOTE:  MD ok's to hold off on BIPAP @ this time.

## 2017-07-20 NOTE — Progress Notes (Signed)
Progress Note  Patient Name: Shelley [name] Date of Encounter: 07/20/2017  Primary Cardiologist: Cristopher Peru, MD   Subjective   Friend currently in room.  She is sleepy.  Resting.  Mild cough at times  Inpatient Medications    Scheduled Meds: . atorvastatin  20 mg Oral Daily  . chlorhexidine  15 mL Mouth Rinse BID  . Chlorhexidine Gluconate Cloth  6 each Topical Daily  . diltiazem  60 mg Oral Q6H  . feeding supplement (ENSURE ENLIVE)  237 mL Oral TID BM  . folic acid  1 mg Oral Daily  . levalbuterol  0.63 mg Nebulization Q6H  . levETIRAcetam  500 mg Oral BID  . magnesium oxide  400 mg Oral BID  . mouth rinse  15 mL Mouth Rinse q12n4p  . multivitamin with minerals  1 tablet Oral Daily  . nicotine  21 mg Transdermal Daily  . sodium chloride flush  10-40 mL Intracatheter Q12H  . thiamine  100 mg Oral Daily   Continuous Infusions:  PRN Meds: alum & mag hydroxide-simeth, lidocaine, LORazepam, metoprolol tartrate, morphine injection, ondansetron, oxyCODONE, polyethylene glycol, RESOURCE THICKENUP CLEAR, sodium chloride flush   Vital Signs    Vitals:   07/20/17 1159 07/20/17 1200 07/20/17 1300 07/20/17 1415  BP: 125/87 124/85 109/73   Pulse:  (!) 106 97   Resp:  (!) 24 20   Temp:      TempSrc:      SpO2:  99% 91% 98%  Weight:      Height:        Intake/Output Summary (Last 24 hours) at 07/20/2017 1508 Last data filed at 07/20/2017 1200 Gross per 24 hour  Intake 1200 ml  Output 3400 ml  Net -2200 ml   Filed Weights   07/12/17 1643 07/13/17 1741 07/14/17 0545  Weight: 152 lb 5.4 oz (69.1 kg) 152 lb 5.4 oz (69.1 kg) 148 lb 5.9 oz (67.3 kg)    Telemetry    Atrial fibrillation heart rate under better control.  Currently 90.- Personally Reviewed  ECG    Atrial fibrillation with rapid ventricular response- Personally Reviewed  Physical Exam   GEN: Ill-appearing, in no acute distress  HEENT: normal  Neck: no JVD, carotid bruits, or masses Cardiac: irreg; no  murmurs, rubs, or gallops,no edema  Respiratory:  clear to auscultation bilaterally, normal work of breathing GI: soft, nontender, nondistended, + BS MS: AKA Skin: warm and dry, no rash Neuro:  Alert but confused, Strength and sensation are intact Psych: euthymic mood, full affect    Labs    Chemistry Recent Labs  Lab 07/17/17 0335 07/18/17 0716 07/18/17 1611  NA 137 140 143  K 3.7 3.6 3.9  CL 106 107 110  CO2 23 22 26   GLUCOSE 142* 120* 145*  BUN 13 13 12   CREATININE 0.56 0.55 0.58  CALCIUM 7.9* 7.8* 7.5*  PROT 4.3* 4.3*  --   ALBUMIN 2.1* 2.0*  --   AST 28 28  --   ALT 17 17  --   ALKPHOS 191* 164*  --   BILITOT 1.1 1.0  --   GFRNONAA >60 >60 >60  GFRAA >60 >60 >60  ANIONGAP 8 11 7      Hematology Recent Labs  Lab 07/18/17 0716 07/19/17 0500 07/19/17 0923 07/20/17 0205  WBC 15.3* 12.5*  --  17.7*  RBC 2.59* 1.96*  --  3.51*  HGB 8.1* 5.9* 6.4* 9.9*  HCT 24.5* 18.2* 19.6* 30.2*  MCV 94.6 92.9  --  86.0  MCH 31.3 30.1  --  28.2  MCHC 33.1 32.4  --  32.8  RDW 15.2 14.8  --  20.8*  PLT 159 162  --  100*    Cardiac EnzymesNo results for input(s): TROPONINI in the last 168 hours.  No results for input(s): TROPIPOC in the last 168 hours.   BNPNo results for input(s): BNP, PROBNP in the last 168 hours.   DDimer No results for input(s): DDIMER in the last 168 hours.   Radiology    Ct Abdomen Pelvis Wo Contrast  Result Date: 07/20/2017 CLINICAL DATA:  Unexplained acute anemia. EXAM: CT ABDOMEN AND PELVIS WITHOUT CONTRAST TECHNIQUE: Multidetector CT imaging of the abdomen and pelvis was performed following the standard protocol without IV contrast. COMPARISON:  CT scan abdomen dated 10/16/2001 and CT scan of the chest dated 07/18/2017 FINDINGS: Lower chest: There are persistent small bilateral pleural effusions. There is persistent consolidation and atelectasis in the left lower lobe. Heart size is normal. Hepatobiliary: Liver parenchyma is normal except for  some focal fatty infiltration adjacent to the falciform ligament. Gallbladder is slightly distended. No biliary ductal dilatation. Pancreas: The pancreas is chronically atrophic but otherwise normal. Spleen: Normal spleen. 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 No hemorrhage at that site. Adrenals/Urinary Tract: Adrenal glands are unremarkable. Kidneys are normal, without renal calculi, focal lesion, or hydronephrosis. Foley catheter in the otherwise normal appearing bladder. Stomach/Bowel: Slight diverticulosis of the distal colon. Otherwise negative. Vascular/Lymphatic: Aortic atherosclerosis. No enlarged abdominal or pelvic lymph nodes. Reproductive: Status post hysterectomy. No adnexal masses. Other: No retroperitoneal hemorrhage.  Minimal presacral edema. Musculoskeletal: There is a 9 x 3 x 2 cm soft tissue density in the left inguinal region which could represent a small hematoma. There is subcutaneous edema in the soft tissues of the lateral aspects of the buttocks. No acute bone abnormality. IMPRESSION: 1. Small soft tissue density in the left inguinal region which may represent a hematoma. Has the patient had a recent arterial or venous catheterization in the left groin? 2. No retroperitoneal or other intra-abdominal or pelvic hemorrhage. 3. Persistent small bilateral pleural effusions with atelectasis/consolidation in the left lower lobe. Electronically Signed   By: Lorriane Shire M.D.   On: 07/20/2017 11:35   Dg Chest 1 View  Result Date: 07/19/2017 CLINICAL DATA:  Left pleural effusion. EXAM: CHEST 1 VIEW COMPARISON:  CT chest 07/18/2017 FINDINGS: Small right pleural effusion. Small left pleural effusion which has significantly improved compared with the prior exam. Bilateral mild interstitial thickening. Spiculated nodule in the right upper lobe is not well delineated on the current exam. No pneumothorax. Stable cardiomediastinal  silhouette. Left IJ central venous catheter with the tip projecting over the upper SVC. No acute osseous abnormality. IMPRESSION: Small right pleural effusion. Small left pleural effusion which has significantly improved compared with the prior exam. Bilateral mild interstitial thickening. Spiculated nodule in the right upper lobe is not well delineated on the current exam. Electronically Signed   By: Kathreen Devoid   On: 07/19/2017 16:35   Ct Head Wo Contrast  Result Date: 07/20/2017 CLINICAL DATA:  Confusion.  Dementia.  Previous strokes. EXAM: CT HEAD WITHOUT CONTRAST TECHNIQUE: Contiguous axial images were obtained from the base of the skull through the vertex without intravenous contrast. COMPARISON:  MRI 06/08/2017.  CT 06/08/2017. FINDINGS: Brain: Advanced chronic small vessel ischemic changes throughout the cerebral hemispheric white matter, thalami I and basal ganglia. No sign  of acute infarction, mass lesion, hemorrhage, hydrocephalus or extra-axial collection. Vascular: There is atherosclerotic calcification of the major vessels at the base of the brain. Skull: Negative Sinuses/Orbits: Clear/normal Other: None IMPRESSION: No acute finding by CT. Advanced chronic small-vessel ischemic changes throughout the brain as seen previously. Electronically Signed   By: Nelson Chimes M.D.   On: 07/20/2017 11:22   Dg Chest Port 1 View  Result Date: 07/20/2017 CLINICAL DATA:  Dyspnea EXAM: PORTABLE CHEST 1 VIEW COMPARISON:  Chest radiograph from one day prior. FINDINGS: Right rotated chest radiograph. Left internal jugular central venous catheter terminates in the left brachiocephalic vein near the junction with the SVC. Stable cardiomediastinal silhouette with top-normal heart size. No pneumothorax. Small bilateral pleural effusions, minimally increased. Patchy left lung base consolidation, increased. Stable mild hazy right upper lung and right lung base opacities. IMPRESSION: 1. Increased patchy left lung base  consolidation with stable mild hazy right upper and right basilar lung opacities. Findings are suggestive of worsening multilobar pneumonia or aspiration, with a component of pulmonary edema not excluded. 2. Small bilateral pleural effusions, minimally increased. Electronically Signed   By: Ilona Sorrel M.D.   On: 07/20/2017 02:58   Ir Thoracentesis Asp Pleural Space W/img Guide  Result Date: 07/19/2017 INDICATION: Shortness of breath. Left-sided pleural effusion. Request for diagnostic and therapeutic thoracentesis. EXAM: ULTRASOUND GUIDED LEFT THORACENTESIS MEDICATIONS: None. COMPLICATIONS: None immediate. PROCEDURE: An ultrasound guided thoracentesis was thoroughly discussed with the patient and questions answered. The benefits, risks, alternatives and complications were also discussed. The patient understands and wishes to proceed with the procedure. Written consent was obtained. Ultrasound was performed to localize and Brandin Dilday an adequate pocket of fluid in the left chest. The area was then prepped and draped in the normal sterile fashion. 1% Lidocaine was used for local anesthesia. Under ultrasound guidance a Safe-T-Centesis catheter was introduced. Thoracentesis was performed. The catheter was removed and a dressing applied. FINDINGS: A total of approximately 550 mL of hazy, amber colored fluid was removed. Samples were sent to the laboratory as requested by the clinical team. IMPRESSION: Successful ultrasound guided left thoracentesis yielding 550 mL of pleural fluid. Read by: Ascencion Dike PA-C Electronically Signed   By: Aletta Edouard M.D.   On: 07/19/2017 17:04    Cardiac Studies   Echocardiogram-normal LV function, trace AI, mildly dilated aortic root, mild to moderate tricuspid regurgitation.  Patient Profile     72 y.o. female recent stroke, embolic event to left lower extremity requiring amputation.  Atrial fibrillation.  Assessment & Plan    Persistent atrial fibrillation -doing  fairly well on PO dilt 60 Q6, heart rates on average less than 110  -Consider consolidating to 240 CD QD prior to discharge  Embolic event lower extremity requiring amputation -Vascular surgery, stable, left AKA, no changes made  Essential hypertension -Previous pressor use.  Still remains slightly low.  Improved however.  No changes  Acute hypoxic respiratory failure/ ?PNA -  Pulmonary toilet., VANC, CEF, hopefully improving  Right upper lobe nodular lung mass -Spiculated mass x2-PET scan recommended as outpatient. Pulmonary, change  Tobacco use -Continue to encourage cessation.    Alcohol use -Protocol in place, CIWA    For questions or updates, please contact [facility] Please consult [email or web address] for contact info under Cardiology/STEMI.      Signed, Candee Furbish, MD  07/20/2017, 3:08 PM

## 2017-07-20 NOTE — Progress Notes (Signed)
Pt refusing to wear NRB mask. Educated patient about increasing oxygen needs. Pt agreed to wear HFNC. Placed on HFNC at 10 L. Will continue to closely monitor.

## 2017-07-20 NOTE — Progress Notes (Signed)
  Speech Language Pathology Treatment: Dysphagia  Patient Details Name: Shelley [name] MRN: [id number] DOB: 03/03/1946 Today's Date: 07/20/2017 Time: 7915-0569 SLP Time Calculation (min) (ACUTE ONLY): 25 min  Assessment / Plan / Recommendation Clinical Impression  Pt does not have as much baseline coughing today as she did during initial evaluation. SLP provided trials of current diet textures (nectar thick liquids, purees), which all elicited delayed coughing. Discussed findings with medical team (critical care, attending physician, RN) - given her worsening respiratory status despite other medical interventions, combined with her clinical findings from PO trials, I am concerned for aspiration. SLP recommended MBS and/or FEES to pt, as safe diet recommendations cannot be made at bedside given coughing noted with all textures. At this point, pt is adamantly declining any additional testing. Even her intake with me at bedside is limited. Would defer diet recommendation to MD at this time as pt is likely a high aspiration risk with any POs. Per MD/NP, further discussions are needed re: [place]. SLP will follow along and assist as able.   HPI HPI: Pt is a 72 y.o. female with PMH of Alcohol abuse, Anxiety, CVA (cerebral infarction), Hypertension, Osteoporosis, and Seizures and recent admission 06/08/17 for CVA (L dorsal pons, R cerebellum, and R thalamus, no dysphagia noted at that time). She presented to [facility] ED 07/12/17 with left foot pain, and was found to have thrombosis and underwent thrombectomy then L AKA on 07/13/17. Postoperatively had had respiratory failure requiring hi-flow O2 as well as A-fib w/ RVR. and post-operative anemia and hypotension.       SLP Plan  Continue with current plan of care       Recommendations  Diet recommendations: Other(comment)(defer to medical team) Medication Administration: Via alternative means                Oral Care Recommendations: Oral care QID Follow  up Recommendations: Skilled Nursing facility SLP Visit Diagnosis: Dysphagia, oropharyngeal phase (R13.12) Plan: Continue with current plan of care       GO                Shelley [name] 07/20/2017, 9:54 AM  Shelley [name], M.A. CCC-SLP [phone]

## 2017-07-20 NOTE — Progress Notes (Signed)
Pt refusing nebs/CPT at this time. RT will continue to monitor.

## 2017-07-20 NOTE — Care Management Note (Signed)
Case Management Note  Patient Details  Name: ALIANA KREISCHER MRN: [id number] Date of Birth: Dec 30, 1945  Subjective/Objective:    Pt admitted with thromboembolism requiring thrombectomy, s/p amputation             Action/Plan:    PTA from home - SNF recommended for discharge - CSW following    Expected Discharge Date:                  Expected Discharge Plan:  Skilled Nursing Facility  In-House Referral:  Clinical Social Work  Discharge planning Services  CM Consult  Post Acute Care Choice:    Choice offered to:     DME Arranged:    DME Agency:     HH Arranged:    [place] Agency:     Status of Service:     If discussed at [employer], dates discussed:    Additional Comments: Pt requiring HFNC Maryclare Labrador, RN 07/20/2017, 3:30 PM

## 2017-07-20 NOTE — Progress Notes (Signed)
[facility] TEAM 1 - Stepdown/ICU TEAM  Shelley [name]  RUE:[id number] DOB: Dec 10, 1945 DOA: 07/12/2017 PCP: [name], MD    Brief Narrative:  72yo F w/ a Hx of Alcohol abuse, Anxiety, CVA, Seizures, HTN, dementia, and Osteoporosis who presented w/ left foot pain.    Significant Events: 1/10 admit w/ L leg pain - taken to OR emergently w/ extensive thrombectomy - L IJ CVL 1/11 L AKA and L common femoral thrombectomy  1/17 L thoracentesis in IR > 523ml - transudate   Subjective: The patient continues to have difficulty with severe hypoxic respiratory failure and is requiring high level oxygen support.  She is alert and conversant but also combative and is not cooperating with her care, often times physically fighting out against the nurse.  Additionally speech has found that she appears to be aspirating virtually every consistency they have offered.  Unfortunately she has refused a modified barium swallow or a fees.  Assessment & Plan:  L Effusion - Aspiration pneumonitis/pneumonia - acute hypoxic respiratory failure Progressively worsening - hx most c/w recurring aspiration - current abx not proving helpful therefore will d/c and watch w/o abx for now - this may prove to be an untreatable issue - Palliative Care consulted to assist in clarifying goals of care   Dysphagia - recurring aspiration Pt has refused MBS or FEES - appears to be aspirating all consistencies tried at bedside - there is no "safe" diet to allow her at this time, but she also would not tolerate/allow an NG feeding tube  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 as per VVS - appears to be stable from this standpoint   Persistent atrial fibrillation Currently requiring Cardizem drip - Cardiology managing - heparin gtt has been stopped due to acute worsening of anemia (>2g Hgb drop)   Anemia No signs of overt bleeding but Hgb dropped significantly - occult blood pending - LDH and Haptoglobin not c/w hemolysis - responded  better than expected to 2U PRBC - check CT abd to r/o [facility]  Recent Labs  Lab 07/17/17 0335 07/18/17 0716 07/19/17 0500 07/19/17 0923 07/20/17 0205  HGB 9.6* 8.1* 5.9* 6.4* 9.9*    Altered mental status patient is uncooperative - she is alert and answers orientation questions correctly, but does appear to be mildly confused - I suspect an element of baseline cognitive impairment, perhaps due to chronic EtOH abuse - will CT head w/o contrast as eval for dementia and in setting of dysphagia to r/o subacute CVA  Chronic grade 2 diastolic CHF (per TTE Dec 2018) No signif volume overload on exam - follow weights Filed Weights   07/12/17 1643 07/13/17 1741 07/14/17 0545  Weight: 69.1 kg (152 lb 5.4 oz) 69.1 kg (152 lb 5.4 oz) 67.3 kg (148 lb 5.9 oz)    Hypotension  Resolved   RUL nodular densities CT angio noted spiculated mass 2 - PET recommended as outpt - unclear if cytology was sent on thora fluid - follow   Tobacco dependence Continue nicotine patch  Hx CVA   Thrombocytopenia (baseline~180) Likely due to EtOH abuse - fluctuating - now off heparin - follow   EtOH abuse Now beyond the point at which ongoing withdrawal would be expected   Moderate malnutrition in context of acute illness/injury  DVT prophylaxis: SCDs Code Status: FULL CODE Family Communication: no family present at time of exam  Disposition Plan: SDU   Consultants:  VVS PCCM Cardiology Palliative Care   Antimicrobials:  Cefepime  1/14 > 1/18 Vancomycin 1/14 > 1/18   Objective: Blood pressure 106/79, pulse (!) 114, temperature (!) 97.3 F (36.3 C), temperature source Oral, resp. rate (!) 23, height 5\' 5"  (1.651 m), weight 67.3 kg (148 lb 5.9 oz), SpO2 98 %.  Intake/Output Summary (Last 24 hours) at 07/20/2017 0918 Last data filed at 07/20/2017 0800 Gross per 24 hour  Intake 2375 ml  Output 3290 ml  Net -915 ml   Filed Weights   07/12/17 1643 07/13/17 1741 07/14/17 0545  Weight: 69.1 kg  (152 lb 5.4 oz) 69.1 kg (152 lb 5.4 oz) 67.3 kg (148 lb 5.9 oz)    Examination: General: requiring 10L HFNC O2 but not in distress on exam  Lungs: no air movement in the L base - no wheezing  Cardiovascular: irreg irreg - rate ~100 - no M  Abdomen: NT/ND, soft, bs+, no rebound  Extremities: L AKA site dressed and dry - no signif edema R LE   CBC: Recent Labs  Lab 07/16/17 0522 07/17/17 0335 07/18/17 0716 07/19/17 0500 07/19/17 0923 07/20/17 0205  WBC 10.6* 13.3* 15.3* 12.5*  --  17.7*  HGB 10.4* 9.6* 8.1* 5.9* 6.4* 9.9*  HCT 31.9* 28.7* 24.5* 18.2* 19.6* 30.2*  MCV 92.2 92.0 94.6 92.9  --  86.0  PLT 114* 146* 159 162  --  355*   Basic Metabolic Panel: Recent Labs  Lab 07/14/17 0748 07/15/17 0437 07/16/17 0522 07/17/17 0335 07/18/17 0716 07/18/17 1611 07/19/17 0500 07/20/17 0205  NA 137 139 137 137 140 143  --   --   K 3.7 3.8 3.6 3.7 3.6 3.9  --   --   CL 111 110 107 106 107 110  --   --   CO2 18* 22 22 23 22 26   --   --   GLUCOSE 143* 114* 107* 142* 120* 145*  --   --   BUN 17 14 11 13 13 12   --   --   CREATININE 0.60 0.57 0.56 0.56 0.55 0.58  --   --   CALCIUM 8.1* 7.8* 8.0* 7.9* 7.8* 7.5*  --   --   MG 1.5* 1.8 1.5* 1.8 1.6*  --  2.1 1.8  PHOS 2.1* 3.0  --   --   --   --   --   --    GFR: Estimated Creatinine Clearance: 57.2 mL/min (by C-G formula based on SCr of 0.58 mg/dL).  Liver Function Tests: Recent Labs  Lab 07/17/17 0335 07/18/17 0716  AST 28 28  ALT 17 17  ALKPHOS 191* 164*  BILITOT 1.1 1.0  PROT 4.3* 4.3*  ALBUMIN 2.1* 2.0*   HbA1C: Hgb A1c MFr Bld  Date/Time Value Ref Range Status  06/09/2017 05:13 AM 5.6 4.8 - 5.6 % Final    Comment:    (NOTE) Pre diabetes:          5.7%-6.4% Diabetes:              >6.4% Glycemic control for   <7.0% adults with diabetes     CBG: Recent Labs  Lab 07/14/17 2020 07/14/17 2348 07/15/17 0408  GLUCAP 134* 114* 105*    Recent Results (from the past 240 hour(s))  MRSA PCR Screening      Status: None   Collection Time: 07/12/17 11:20 PM  Result Value Ref Range Status   MRSA by PCR NEGATIVE NEGATIVE Final    Comment:        The GeneXpert MRSA Assay (FDA  approved for NASAL specimens only), is one component of a comprehensive MRSA colonization surveillance program. It is not intended to diagnose MRSA infection nor to guide or monitor treatment for MRSA infections.   Surgical pcr screen     Status: None   Collection Time: 07/13/17 11:15 AM  Result Value Ref Range Status   MRSA, PCR NEGATIVE NEGATIVE Final   Staphylococcus aureus NEGATIVE NEGATIVE Final    Comment: (NOTE) The Xpert SA Assay (FDA approved for NASAL specimens in patients 39 years of age and older), is one component of a comprehensive surveillance program. It is not intended to diagnose infection nor to guide or monitor treatment.   Culture, blood (routine x 2) Call MD if unable to obtain prior to antibiotics being given     Status: None (Preliminary result)   Collection Time: 07/17/17  3:35 AM  Result Value Ref Range Status   Specimen Description BLOOD LEFT HAND  Final   Special Requests IN PEDIATRIC BOTTLE Blood Culture adequate volume  Final   Culture NO GROWTH 2 DAYS  Final   Report Status PENDING  Incomplete  Culture, blood (routine x 2) Call MD if unable to obtain prior to antibiotics being given     Status: None (Preliminary result)   Collection Time: 07/17/17  3:36 AM  Result Value Ref Range Status   Specimen Description BLOOD LEFT HAND  Final   Special Requests IN PEDIATRIC BOTTLE Blood Culture adequate volume  Final   Culture NO GROWTH 2 DAYS  Final   Report Status PENDING  Incomplete  Gram stain     Status: None   Collection Time: 07/19/17  4:17 PM  Result Value Ref Range Status   Specimen Description PLEURAL  Final   Special Requests NONE  Final   Gram Stain   Final    CYTOSPIN SMEAR WBC PRESENT, PREDOMINANTLY PMN NO ORGANISMS SEEN    Report Status 07/19/2017 FINAL  Final      Scheduled Meds: . atorvastatin  20 mg Oral Daily  . chlorhexidine  15 mL Mouth Rinse BID  . Chlorhexidine Gluconate Cloth  6 each Topical Daily  . diltiazem  60 mg Oral Q6H  . feeding supplement (ENSURE ENLIVE)  237 mL Oral TID BM  . folic acid  1 mg Oral Daily  . levalbuterol  0.63 mg Nebulization Q6H  . magnesium oxide  400 mg Oral BID  . mouth rinse  15 mL Mouth Rinse q12n4p  . midodrine  5 mg Oral TID WC  . multivitamin with minerals  1 tablet Oral Daily  . nicotine  21 mg Transdermal Daily  . sodium chloride flush  10-40 mL Intracatheter Q12H  . thiamine  100 mg Oral Daily     LOS: 8 days   Cherene Altes, MD [facility] Office  [phone] Pager - Text Page per Amion as per below:  On-Call/Text Page:      [email or web address]      password [email or web address]  If 7PM-7AM, please contact night-coverage [email or web address] Password [email or web address] 07/20/2017, 9:18 AM

## 2017-07-20 NOTE — Progress Notes (Signed)
[facility] notified regarding pts CVC leaking/not flushing. Awaiting call back. Reported to oncoming RN. Nursing will continue to monitor.

## 2017-07-20 NOTE — Progress Notes (Addendum)
[facility]  Subjective  - Confused and pleasnt.   Objective 106/79 (!) 114 (!) 97.3 F (36.3 C) (Oral) (!) 23 98%  Intake/Output Summary (Last 24 hours) at 07/20/2017 0843 Last data filed at 07/20/2017 0800 Gross per 24 hour  Intake 2450 ml  Output 3290 ml  Net -840 ml    Left stump with minimal ss drainage.  Stump warm to touch. Left groin incision healing well without drainage, dry guaze placed in groin crease. Heart irregular  Non labored breathing  NAD  Assessment/Planning: POD # 7 left AKA with embolectomy  Stump appears viable warm to touch without ischemic skin changes. Leukocytosis pending blood cultures.  Vancomycin was stopped today.  She was being managed for empiric HCAP. Persistent A fib on Cardizem drip and Heprain. Altered mental state with protective mittens for safety.  History of ETOH abuse.      Roxy Horseman 07/20/2017 8:43 AM --  Laboratory Lab Results: Recent Labs    07/19/17 0500 07/19/17 0923 07/20/17 0205  WBC 12.5*  --  17.7*  HGB 5.9* 6.4* 9.9*  HCT 18.2* 19.6* 30.2*  PLT 162  --  100*   BMET Recent Labs    07/18/17 0716 07/18/17 1611  NA 140 143  K 3.6 3.9  CL 107 110  CO2 22 26  GLUCOSE 120* 145*  BUN 13 12  CREATININE 0.55 0.58  CALCIUM 7.8* 7.5*    COAG Lab Results  Component Value Date   INR 1.18 07/13/2017   INR 1.02 06/08/2017   No results found for: PTT  Addendum  Not change from yesterday.  Will check on patient on Monday  Adele Barthel, MD, FACS [facility] Office: [phone] Pager: [phone]  07/20/2017, 12:49 PM

## 2017-07-20 NOTE — Progress Notes (Signed)
Pt began to desat on 3L [place]. She had an episode during dayshift where she had desated.  Then see was started on highflow [place] at 10 3L.  She has been on 3-4L [place] throughout the night.  She was very anxious earlier.  She had her central line dressing pulled off and was stated that it was valentines day and that she needed to get out of here. Pt began to swing at me when I tried to take her grip off of the central line. I tried to give her PO medications earlier in applesauce and she said she was not going to take them.  I spoke with [facility] and got new orders for IV meds to replace her PO meds. Respiratory therapy came to floor and placed pt on nonrebreather.  She is more lethargic now and this is a change from even an hour ago.  Pt has been restless in bed most of the night.  New orders for BIPAP, ABG, and Lasix.  Will continue to monitor.

## 2017-07-20 NOTE — Progress Notes (Signed)
PULMONARY / CRITICAL CARE MEDICINE   Name: Shelley [name] MRN: [id number] DOB: [birth year]    ADMISSION DATE:  07/12/2017 CONSULTATION DATE:  07/12/2017  REFERRING MD:  Dr Bridgett Larsson  CHIEF COMPLAINT:  Left foot pain  SUBJECTIVE: feels like "shit" this morning, but unable to give any specifics. Breathing is "ok I guess". Worsening hypoxia yesterday escalating to 10L HFNC.    VITAL SIGNS: BP 121/89   Pulse (!) 101   Temp (!) 97.3 F (36.3 C) (Oral)   Resp (!) 24   Ht 5\' 5"  (1.651 m)   Wt 67.3 kg (148 lb 5.9 oz)   SpO2 96%   BMI 24.69 kg/m  High flow oxygen   HEMODYNAMICS:    VENTILATOR SETTINGS:    INTAKE / OUTPUT: I/O last 3 completed shifts: In: 3125 [I.V.:1900; Blood:675; IV Piggyback:550] Out: 3818 [Urine:3560]  PHYSICAL EXAMINATION:  General: Elderly female in NAD HEENT: [place]/AT, PERRL, nO JVD Pulmonary: Normal effort. Diminished L, R scattered rales.  Cardiac: IRIR no MRG Abdomen: Soft, non-tender Extremities: No edema. LLE amputation this admit.  Neuro: Uncooperative this AM. Traditionally oriented x 73 and seems to be at about this baseline again today.   LABS:  BMET Recent Labs  Lab 07/17/17 0335 07/18/17 0716 07/18/17 1611  NA 137 140 143  K 3.7 3.6 3.9  CL 106 107 110  CO2 23 22 26   BUN 13 13 12   CREATININE 0.56 0.55 0.58  GLUCOSE 142* 120* 145*   Electrolytes Recent Labs  Lab 07/14/17 0748 07/15/17 0437  07/17/17 0335 07/18/17 0716 07/18/17 1611 07/19/17 0500 07/20/17 0205  CALCIUM 8.1* 7.8*   < > 7.9* 7.8* 7.5*  --   --   MG 1.5* 1.8   < > 1.8 1.6*  --  2.1 1.8  PHOS 2.1* 3.0  --   --   --   --   --   --    < > = values in this interval not displayed.   CBC Recent Labs  Lab 07/18/17 0716 07/19/17 0500 07/19/17 0923 07/20/17 0205  WBC 15.3* 12.5*  --  17.7*  HGB 8.1* 5.9* 6.4* 9.9*  HCT 24.5* 18.2* 19.6* 30.2*  PLT 159 162  --  100*   Coag's No results for input(s): APTT, INR in the last 168 hours. Sepsis Markers Recent Labs   Lab 07/13/17 2326 07/17/17 1121  LATICACIDVEN 1.1  --   PROCALCITON  --  0.20   ABG Recent Labs  Lab 07/20/17 0243  PHART 7.392  PCO2ART 37.1  PO2ART 58.0*   Liver Enzymes Recent Labs  Lab 07/17/17 0335 07/18/17 0716  AST 28 28  ALT 17 17  ALKPHOS 191* 164*  BILITOT 1.1 1.0  ALBUMIN 2.1* 2.0*   Cardiac Enzymes No results for input(s): TROPONINI, PROBNP in the last 168 hours.  Glucose Recent Labs  Lab 07/14/17 2020 07/14/17 2348 07/15/17 0408  GLUCAP 134* 114* 105*   Imaging Dg Chest 1 View  Result Date: 07/19/2017 CLINICAL DATA:  Left pleural effusion. EXAM: CHEST 1 VIEW COMPARISON:  CT chest 07/18/2017 FINDINGS: Small right pleural effusion. Small left pleural effusion which has significantly improved compared with the prior exam. Bilateral mild interstitial thickening. Spiculated nodule in the right upper lobe is not well delineated on the current exam. No pneumothorax. Stable cardiomediastinal silhouette. Left IJ central venous catheter with the tip projecting over the upper SVC. No acute osseous abnormality. IMPRESSION: Small right pleural effusion. Small left pleural effusion which has significantly improved  compared with the prior exam. Bilateral mild interstitial thickening. Spiculated nodule in the right upper lobe is not well delineated on the current exam. Electronically Signed   By: Kathreen Devoid   On: 07/19/2017 16:35   Dg Chest Port 1 View  Result Date: 07/20/2017 CLINICAL DATA:  Dyspnea EXAM: PORTABLE CHEST 1 VIEW COMPARISON:  Chest radiograph from one day prior. FINDINGS: Right rotated chest radiograph. Left internal jugular central venous catheter terminates in the left brachiocephalic vein near the junction with the SVC. Stable cardiomediastinal silhouette with top-normal heart size. No pneumothorax. Small bilateral pleural effusions, minimally increased. Patchy left lung base consolidation, increased. Stable mild hazy right upper lung and right lung base  opacities. IMPRESSION: 1. Increased patchy left lung base consolidation with stable mild hazy right upper and right basilar lung opacities. Findings are suggestive of worsening multilobar pneumonia or aspiration, with a component of pulmonary edema not excluded. 2. Small bilateral pleural effusions, minimally increased. Electronically Signed   By: Ilona Sorrel M.D.   On: 07/20/2017 02:58   Ir Thoracentesis Asp Pleural Space W/img Guide  Result Date: 07/19/2017 INDICATION: Shortness of breath. Left-sided pleural effusion. Request for diagnostic and therapeutic thoracentesis. EXAM: ULTRASOUND GUIDED LEFT THORACENTESIS MEDICATIONS: None. COMPLICATIONS: None immediate. PROCEDURE: An ultrasound guided thoracentesis was thoroughly discussed with the patient and questions answered. The benefits, risks, alternatives and complications were also discussed. The patient understands and wishes to proceed with the procedure. Written consent was obtained. Ultrasound was performed to localize and mark an adequate pocket of fluid in the left chest. The area was then prepped and draped in the normal sterile fashion. 1% Lidocaine was used for local anesthesia. Under ultrasound guidance a Safe-T-Centesis catheter was introduced. Thoracentesis was performed. The catheter was removed and a dressing applied. FINDINGS: A total of approximately 550 mL of hazy, amber colored fluid was removed. Samples were sent to the laboratory as requested by the clinical team. IMPRESSION: Successful ultrasound guided left thoracentesis yielding 550 mL of pleural fluid. Read by: Ascencion Dike PA-C Electronically Signed   By: Aletta Edouard M.D.   On: 07/19/2017 17:04   STUDIES:  CXR 1/10 > RUL nodular density. CT chest 1/16 > Large left pleural effusion with compressive atelectasis and overall volume loss. Small right pleural effusion.  Mild right basilar atelectasis. Airspace disease in the posterior segment of the right upper lobe most  concerning for pneumonia. Spiculated 1.4 x 0.6 mm right upper lobe pulmonary nodule adjacent, partially contiguous, spiculated pulmonary nodule measuring 9 x 8 mm in the right upper lobe.  CULTURES: None.  ANTIBIOTICS: Cefepime 1/14 >> Vancomycin 1/14 >>  SIGNIFICANT EVENTS: 1/10 > admitted with left leg pain, likely ischemic from embolic phenomenon.  Taken to OR.  LINES/TUBES: L IJ CVL 1/10 >   DISCUSSION: 72 y.o. female admitted 1/10 with left leg pain.  Felt to have ischemia; therefore, taken to OR for attempts at thrombectomy.  In OR, found to have dead muscle in all LLE compartments. Underwent aputation. Post-operative course complicated by persistent hypoxemia. CT demonstrated consolidation/effusion. Thoracentesis done 1/17 showed transudate.   ASSESSMENT / PLAN:  Acute Hypoxic Respiratory failure with ongoing high FiO2 needs. Progressive left-sided airspace disease most consistent with pneumonia/atelectasis - Cannot exclude aspiration event/hcap - Almost total collapse L CXR on 1/16. PCT low 1/15. - CT 1/17 with combination consolidation effusion on L occupying large amount of hemithorax.  Plan Continue high flow oxygen Aggressive pulmonary hygiene In regards to possible pneumonia: Now on day #6 cefepime  and vancomycin. Aspiration precautions Perhaps needs more extensive aspiration evaluation as she is not having much improvement and even some decline. SLP to see again today. Also wonder if palliative care involvement would be reasonable given her baseline dementia and confusion interfering with and refusing care.  L sided pleural effusion > transudate by LDH, protein - Follow remaining pleural studies.   RUL spiculated nodules Plan - F/u PET as outpatient.   Tobacco dependence Plan - Nicotine patch  Georgann Housekeeper, AGACNP-BC [place] [facility] Pager [phone] or [phone]  07/20/2017 8:10 AM

## 2017-07-21 DIAGNOSIS — I4819 Other persistent atrial fibrillation: Secondary | ICD-10-CM

## 2017-07-21 LAB — COMPREHENSIVE METABOLIC PANEL
ALBUMIN: 2.6 g/dL — AB (ref 3.5–5.0)
ALT: 22 U/L (ref 14–54)
ANION GAP: 14 (ref 5–15)
AST: 33 U/L (ref 15–41)
Alkaline Phosphatase: 174 U/L — ABNORMAL HIGH (ref 38–126)
BILIRUBIN TOTAL: 1.9 mg/dL — AB (ref 0.3–1.2)
BUN: 11 mg/dL (ref 6–20)
CHLORIDE: 100 mmol/L — AB (ref 101–111)
CO2: 24 mmol/L (ref 22–32)
Calcium: 8.4 mg/dL — ABNORMAL LOW (ref 8.9–10.3)
Creatinine, Ser: 0.6 mg/dL (ref 0.44–1.00)
GFR calc Af Amer: >60 mL/min (ref >60)
GFR calc non Af Amer: >60 mL/min (ref >60)
GLUCOSE: 114 mg/dL — AB (ref 65–99)
POTASSIUM: 3.7 mmol/L (ref 3.5–5.1)
Sodium: 138 mmol/L (ref 135–145)
TOTAL PROTEIN: 5.4 g/dL — AB (ref 6.5–8.1)

## 2017-07-21 LAB — IRON AND TIBC
Iron: 19 ug/dL — ABNORMAL LOW (ref 28–170)
Saturation Ratios: 12 % (ref 10.4–31.8)
TIBC: 160 ug/dL — AB (ref 250–450)
UIBC: 141 ug/dL

## 2017-07-21 LAB — CBC
HCT: 32.4 % — ABNORMAL LOW (ref 36.0–46.0)
Hemoglobin: 10.8 g/dL — ABNORMAL LOW (ref 12.0–15.0)
MCH: 29.2 pg (ref 26.0–34.0)
MCHC: 33.3 g/dL (ref 30.0–36.0)
MCV: 87.6 fL (ref 78.0–100.0)
PLATELETS: 188 10*3/uL (ref 150–400)
RBC: 3.7 MIL/uL — ABNORMAL LOW (ref 3.87–5.11)
RDW: 20.7 % — AB (ref 11.5–15.5)
WBC: 17.3 10*3/uL — AB (ref 4.0–10.5)

## 2017-07-21 LAB — VITAMIN B12: Vitamin B-12: 218 pg/mL (ref 180–914)

## 2017-07-21 LAB — FOLATE: FOLATE: 8.1 ng/mL (ref >5.9)

## 2017-07-21 LAB — FERRITIN: Ferritin: 346 ng/mL — ABNORMAL HIGH (ref 11–307)

## 2017-07-21 LAB — MRSA PCR SCREENING: MRSA BY PCR: NEGATIVE

## 2017-07-21 LAB — RETICULOCYTES
RBC.: 3.7 MIL/uL — ABNORMAL LOW (ref 3.87–5.11)
RETIC COUNT ABSOLUTE: 111 10*3/uL (ref 19.0–186.0)
Retic Ct Pct: 3 % (ref 0.4–3.1)

## 2017-07-21 LAB — MAGNESIUM: MAGNESIUM: 1.7 mg/dL (ref 1.7–2.4)

## 2017-07-21 MED ORDER — METOPROLOL TARTRATE 5 MG/5ML IV SOLN
2.5000 mg | Freq: Once | INTRAVENOUS | Status: AC
  Administered 2017-07-21: 2.5 mg via INTRAVENOUS
  Filled 2017-07-21: qty 5

## 2017-07-21 MED ORDER — LEVETIRACETAM 100 MG/ML PO SOLN
500.0000 mg | Freq: Two times a day (BID) | ORAL | Status: DC
  Administered 2017-07-21 – 2017-07-31 (×18): 500 mg via ORAL
  Filled 2017-07-21 (×22): qty 5

## 2017-07-21 MED ORDER — CYANOCOBALAMIN 1000 MCG/ML IJ SOLN
1000.0000 ug | Freq: Once | INTRAMUSCULAR | Status: AC
  Administered 2017-07-21: 1000 ug via SUBCUTANEOUS
  Filled 2017-07-21: qty 1

## 2017-07-21 MED ORDER — SODIUM CHLORIDE 0.9 % IV SOLN
INTRAVENOUS | Status: DC
  Administered 2017-07-21 – 2017-07-22 (×2): via INTRAVENOUS

## 2017-07-21 MED ORDER — MORPHINE SULFATE (PF) 4 MG/ML IV SOLN
1.0000 mg | INTRAVENOUS | Status: DC | PRN
  Administered 2017-07-29: 2 mg via INTRAVENOUS
  Filled 2017-07-21: qty 1

## 2017-07-21 MED ORDER — DILTIAZEM HCL 60 MG PO TABS
90.0000 mg | ORAL_TABLET | Freq: Three times a day (TID) | ORAL | Status: DC
  Administered 2017-07-21 – 2017-07-23 (×6): 90 mg via ORAL
  Filled 2017-07-21 (×7): qty 1

## 2017-07-21 NOTE — Progress Notes (Signed)
Pt temp 100.8 F orally. RN notified.

## 2017-07-21 NOTE — Progress Notes (Addendum)
[facility] TEAM 1 - Stepdown/ICU TEAM  DRU LAUREL  ESP:[id number] DOB: Jun 23, 1946 DOA: 07/12/2017 PCP: [name], MD    Brief Narrative:  72yo F w/ a Hx of Alcohol abuse, Anxiety, CVA, Seizures, HTN, dementia, and Osteoporosis who presented w/ left foot pain.    Significant Events: 1/10 admit w/ L leg pain - taken to OR emergently w/ extensive thrombectomy - L IJ CVL 1/11 L AKA and L common femoral thrombectomy  1/17 L thoracentesis in IR > 593ml - transudate   Subjective: Alert and conversant, but confused.  Unable to provide a reliable detailed hx.  Denies complaints, but then tells me she feels "terrible in general."  Has continued to refuse to cooperate w/ her care intermittently.    Assessment & Plan:  L Effusion - Aspiration pneumonitis/pneumonia - acute hypoxic respiratory failure Progressively worsening - hx most c/w recurring aspiration - current abx not proving helpful therefore currently off abx - this may prove to be an untreatable issue - Palliative Care consulted to assist in clarifying goals of care   Dysphagia - recurring aspiration Pt has refused MBS or FEES - appears to be aspirating all consistencies tried at bedside - there is no "safe" diet to allow her at this time, but she also would not tolerate/allow an NG feeding tube - I spoke w/ her husband yesterday and explained our predicament, and we agreed that allowing her to eat while accepting the risk of aspiration was the best course of action   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 as per [facility] - appears to be stable from this standpoint   Persistent atrial fibrillation HR remains suboptimally controlled - pt often refusing meds - heparin gtt has been stopped due to acute worsening of anemia (>2g Hgb drop)   Anemia of unclear etiology  No signs of overt bleeding - occult blood pending - LDH and Haptoglobin not c/w hemolysis - responded better than expected to 2U PRBC - CT abd w/o evidence of RPH - Hgb stable at  this time   Recent Labs  Lab 07/18/17 0716 07/19/17 0500 07/19/17 0923 07/20/17 0205 07/21/17 0425  HGB 8.1* 5.9* 6.4* 9.9* 10.8*    Altered mental status - vascular dementia  patient is uncooperative - she is alert and answers orientation questions correctly, but does appear to be mildly confused - I suspect an element of baseline cognitive impairment - CT head revealed no CVA but has confirmed findings c/w severe vascular mediated dementia   Chronic grade 2 diastolic CHF (per TTE Dec 2018) Appears to be mildly volume depleted - gently hydrate - follow weights Filed Weights   07/12/17 1643 07/13/17 1741 07/14/17 0545  Weight: 69.1 kg (152 lb 5.4 oz) 69.1 kg (152 lb 5.4 oz) 67.3 kg (148 lb 5.9 oz)    Hypotension  Recurring in face of limited intake - hydrate gently and follow   RUL nodular densities CT angio noted spiculated mass 2 - PET recommended as outpt - unclear if cytology was sent on thora fluid - follow   Tobacco dependence Continue nicotine patch  Hx CVA   Thrombocytopenia (baseline~180) Likely due to EtOH abuse - fluctuating - now off heparin - follow   EtOH abuse Now beyond the point at which ongoing withdrawal would be expected   Moderate malnutrition in context of acute illness/injury  DVT prophylaxis: SCDs Code Status: FULL CODE Family Communication: no family present at time of exam - spoke w/ husband via phone 1/18  Disposition Plan: SDU   Consultants:  [facility] PCCM Cardiology Palliative Care   Antimicrobials:  Cefepime 1/14 > 1/18 Vancomycin 1/14 > 1/18   Objective: Blood pressure 101/67, pulse (!) 102, temperature (!) 97.4 F (36.3 C), temperature source Oral, resp. rate (!) 23, height 5\' 5"  (1.651 m), weight 67.3 kg (148 lb 5.9 oz), SpO2 94 %.  Intake/Output Summary (Last 24 hours) at 07/21/2017 1032 Last data filed at 07/21/2017 0500 Gross per 24 hour  Intake -  Output 2150 ml  Net -2150 ml   Filed Weights   07/12/17 1643  07/13/17 1741 07/14/17 0545  Weight: 69.1 kg (152 lb 5.4 oz) 69.1 kg (152 lb 5.4 oz) 67.3 kg (148 lb 5.9 oz)    Examination: General: no acute distress on RA (removed her own [place])  Lungs: no air movement in the L base - CTA th/o other fields  Cardiovascular: irreg irreg - rate ~110 - no M  Abdomen: NT/ND, soft, bs+, no rebound  Extremities: L AKA site dressed and dry - no edema R LE    CBC: Recent Labs  Lab 07/17/17 0335 07/18/17 0716 07/19/17 0500 07/19/17 0923 07/20/17 0205 07/21/17 0425  WBC 13.3* 15.3* 12.5*  --  17.7* 17.3*  HGB 9.6* 8.1* 5.9* 6.4* 9.9* 10.8*  HCT 28.7* 24.5* 18.2* 19.6* 30.2* 32.4*  MCV 92.0 94.6 92.9  --  86.0 87.6  PLT 146* 159 162  --  100* 226   Basic Metabolic Panel: Recent Labs  Lab 07/15/17 0437 07/16/17 0522 07/17/17 0335 07/18/17 0716 07/18/17 1611 07/19/17 0500 07/20/17 0205 07/21/17 0425  NA 139 137 137 140 143  --   --  138  K 3.8 3.6 3.7 3.6 3.9  --   --  3.7  CL 110 107 106 107 110  --   --  100*  CO2 22 22 23 22 26   --   --  24  GLUCOSE 114* 107* 142* 120* 145*  --   --  114*  BUN 14 11 13 13 12   --   --  11  CREATININE 0.57 0.56 0.56 0.55 0.58  --   --  0.60  CALCIUM 7.8* 8.0* 7.9* 7.8* 7.5*  --   --  8.4*  MG 1.8 1.5* 1.8 1.6*  --  2.1 1.8 1.7  PHOS 3.0  --   --   --   --   --   --   --    GFR: Estimated Creatinine Clearance: 57.2 mL/min (by C-G formula based on SCr of 0.6 mg/dL).  Liver Function Tests: Recent Labs  Lab 07/17/17 0335 07/18/17 0716 07/21/17 0425  AST 28 28 33  ALT 17 17 22   ALKPHOS 191* 164* 174*  BILITOT 1.1 1.0 1.9*  PROT 4.3* 4.3* 5.4*  ALBUMIN 2.1* 2.0* 2.6*   HbA1C: Hgb A1c MFr Bld  Date/Time Value Ref Range Status  06/09/2017 05:13 AM 5.6 4.8 - 5.6 % Final    Comment:    (NOTE) Pre diabetes:          5.7%-6.4% Diabetes:              >6.4% Glycemic control for   <7.0% adults with diabetes     CBG: Recent Labs  Lab 07/14/17 2020 07/14/17 2348 07/15/17 0408  GLUCAP 134* 114*  105*    Recent Results (from the past 240 hour(s))  MRSA PCR Screening     Status: None   Collection Time: 07/12/17 11:20 PM  Result Value  Ref Range Status   MRSA by PCR NEGATIVE NEGATIVE Final    Comment:        The GeneXpert MRSA Assay (FDA approved for NASAL specimens only), is one component of a comprehensive MRSA colonization surveillance program. It is not intended to diagnose MRSA infection nor to guide or monitor treatment for MRSA infections.   Surgical pcr screen     Status: None   Collection Time: 07/13/17 11:15 AM  Result Value Ref Range Status   MRSA, PCR NEGATIVE NEGATIVE Final   Staphylococcus aureus NEGATIVE NEGATIVE Final    Comment: (NOTE) The Xpert SA Assay (FDA approved for NASAL specimens in patients 9 years of age and older), is one component of a comprehensive surveillance program. It is not intended to diagnose infection nor to guide or monitor treatment.   Culture, blood (routine x 2) Call MD if unable to obtain prior to antibiotics being given     Status: None (Preliminary result)   Collection Time: 07/17/17  3:35 AM  Result Value Ref Range Status   Specimen Description BLOOD LEFT HAND  Final   Special Requests IN PEDIATRIC BOTTLE Blood Culture adequate volume  Final   Culture NO GROWTH 3 DAYS  Final   Report Status PENDING  Incomplete  Culture, blood (routine x 2) Call MD if unable to obtain prior to antibiotics being given     Status: None (Preliminary result)   Collection Time: 07/17/17  3:36 AM  Result Value Ref Range Status   Specimen Description BLOOD LEFT HAND  Final   Special Requests IN PEDIATRIC BOTTLE Blood Culture adequate volume  Final   Culture NO GROWTH 3 DAYS  Final   Report Status PENDING  Incomplete  Gram stain     Status: None   Collection Time: 07/19/17  4:17 PM  Result Value Ref Range Status   Specimen Description PLEURAL  Final   Special Requests NONE  Final   Gram Stain   Final    CYTOSPIN SMEAR WBC PRESENT,  PREDOMINANTLY PMN NO ORGANISMS SEEN    Report Status 07/19/2017 FINAL  Final  Culture, body fluid-bottle     Status: None (Preliminary result)   Collection Time: 07/19/17  4:17 PM  Result Value Ref Range Status   Specimen Description PLEURAL  Final   Special Requests NONE  Final   Culture NO GROWTH < 24 HOURS  Final   Report Status PENDING  Incomplete     Scheduled Meds: . atorvastatin  20 mg Oral Daily  . chlorhexidine  15 mL Mouth Rinse BID  . Chlorhexidine Gluconate Cloth  6 each Topical Daily  . diltiazem  60 mg Oral Q6H  . feeding supplement (ENSURE ENLIVE)  237 mL Oral TID BM  . folic acid  1 mg Oral Daily  . levalbuterol  0.63 mg Nebulization Q6H  . levETIRAcetam  500 mg Oral BID  . magnesium oxide  400 mg Oral BID  . mouth rinse  15 mL Mouth Rinse q12n4p  . multivitamin with minerals  1 tablet Oral Daily  . nicotine  21 mg Transdermal Daily  . sodium chloride flush  10-40 mL Intracatheter Q12H  . thiamine  100 mg Oral Daily     LOS: 9 days   Cherene Altes, MD [facility] Office  [phone] Pager - Text Page per Amion as per below:  On-Call/Text Page:      [email or web address]      password [email or web address]  If 7PM-7AM, please contact night-coverage [email or web address]  Password [email or web address] 07/21/2017, 10:32 AM

## 2017-07-21 NOTE — Progress Notes (Signed)
  Speech Language Pathology Treatment: Dysphagia  Patient Details Name: Shelley [name] MRN: [id number] DOB: Jul 19, 1945 Today's Date: 07/21/2017 Time: 9166-0600 SLP Time Calculation (min) (ACUTE ONLY): 10 min  Assessment / Plan / Recommendation Clinical Impression  Pt seen for follow-up for dysphagia. Per Dr. Thereasa Solo, he and pt's husband discussed and agreed to allow pt to eat, accepting risks of aspiration. RN reports pt has been tolerating nectar, purees without difficulty. Pt again requiring some encouragement to accept PO, but again presents with consistent, delayed coughing 1-2 seconds after bites of puree. No overt signs of aspiration observed with nectar. Reviewed pt's aspiration risks with pt and her husband, again advising there is no "safe" diet recommendation I can make at bedside and further testing necessary to help determine safest consistency and r/o whether pt is aspirating. Pt is more accepting and agreeable to instrumental testing, agrees to consider MBS. Session terminated as SWOT nurse arrived to transfer pt. Will follow up next date at bedside to determine whether pt able/willing to participate in MBS. Until then, continue current diet of dys 1/nectar per MD, with meds crushed in puree.     HPI HPI: Pt is a 72 y.o. female with PMH of Alcohol abuse, Anxiety, CVA (cerebral infarction), Hypertension, Osteoporosis, and Seizures and recent admission 06/08/17 for CVA (L dorsal pons, R cerebellum, and R thalamus, no dysphagia noted at that time). She [facility] ED 07/12/17 with left foot pain, and was found to have thrombosis and underwent thrombectomy then L AKA on 07/13/17. Postoperatively had had respiratory failure requiring hi-flow O2 as well as A-fib w/ RVR. and post-operative anemia and hypotension.       SLP Plan  Continue with current plan of care;MBS       Recommendations  Diet recommendations: Nectar-thick liquid;Dysphagia 1 (puree) Liquids provided via:  Cup Medication Administration: Via alternative means Supervision: Full supervision/cueing for compensatory strategies Compensations: Minimize environmental distractions;Slow rate;Small sips/bites Postural Changes and/or Swallow Maneuvers: Seated upright 90 degrees                Oral Care Recommendations: Oral care QID Follow up Recommendations: Skilled Nursing facility SLP Visit Diagnosis: Dysphagia, oropharyngeal phase (R13.12) Plan: Continue with current plan of care;MBS       [place], [place], [place] Pathologist [phone]  Aliene Altes 07/21/2017, 4:48 PM

## 2017-07-21 NOTE — Progress Notes (Signed)
Events noted - agree with palliative care consult. Afib rate generally controlled, but she has been refusing meds per Dr. Thereasa Solo. Off anticoagulation d/t anemia - would pursue rate control strategy as we are. Will be available as needed over the weekend.  Pixie Casino, MD, [facility], [place] Director of the Advanced Lipid Disorders &  Cardiovascular Risk Reduction Clinic Diplomate of the American Board of Clinical Lipidology Attending Cardiologist  Direct Dial: [phone]  Fax: [phone]  Website:  [email or web address]

## 2017-07-22 ENCOUNTER — Encounter (HOSPITAL_COMMUNITY): Payer: Self-pay

## 2017-07-22 ENCOUNTER — Inpatient Hospital Stay (HOSPITAL_COMMUNITY): Payer: Medicare Other

## 2017-07-22 DIAGNOSIS — E440 Moderate protein-calorie malnutrition: Secondary | ICD-10-CM

## 2017-07-22 DIAGNOSIS — Z515 Encounter for palliative care: Secondary | ICD-10-CM

## 2017-07-22 DIAGNOSIS — Z7189 Other specified counseling: Secondary | ICD-10-CM

## 2017-07-22 LAB — BASIC METABOLIC PANEL
ANION GAP: 11 (ref 5–15)
BUN: 20 mg/dL (ref 6–20)
CHLORIDE: 106 mmol/L (ref 101–111)
CO2: 26 mmol/L (ref 22–32)
Calcium: 8.3 mg/dL — ABNORMAL LOW (ref 8.9–10.3)
Creatinine, Ser: 0.52 mg/dL (ref 0.44–1.00)
Glucose, Bld: 111 mg/dL — ABNORMAL HIGH (ref 65–99)
POTASSIUM: 4 mmol/L (ref 3.5–5.1)
SODIUM: 143 mmol/L (ref 135–145)

## 2017-07-22 LAB — CBC
HCT: 31.9 % — ABNORMAL LOW (ref 36.0–46.0)
HEMOGLOBIN: 10.2 g/dL — AB (ref 12.0–15.0)
MCH: 28.8 pg (ref 26.0–34.0)
MCHC: 32 g/dL (ref 30.0–36.0)
MCV: 90.1 fL (ref 78.0–100.0)
PLATELETS: 208 10*3/uL (ref 150–400)
RBC: 3.54 MIL/uL — AB (ref 3.87–5.11)
RDW: 20.7 % — ABNORMAL HIGH (ref 11.5–15.5)
WBC: 14 10*3/uL — AB (ref 4.0–10.5)

## 2017-07-22 LAB — CULTURE, BLOOD (ROUTINE X 2)
CULTURE: NO GROWTH
CULTURE: NO GROWTH
Special Requests: ADEQUATE
Special Requests: ADEQUATE

## 2017-07-22 MED ORDER — LEVALBUTEROL HCL 0.63 MG/3ML IN NEBU
0.6300 mg | INHALATION_SOLUTION | RESPIRATORY_TRACT | Status: DC | PRN
  Administered 2017-07-22 – 2017-07-23 (×3): 0.63 mg via RESPIRATORY_TRACT
  Filled 2017-07-22 (×3): qty 3

## 2017-07-22 MED ORDER — LEVALBUTEROL HCL 0.63 MG/3ML IN NEBU
0.6300 mg | INHALATION_SOLUTION | Freq: Three times a day (TID) | RESPIRATORY_TRACT | Status: DC
  Administered 2017-07-22: 0.63 mg via RESPIRATORY_TRACT
  Filled 2017-07-22: qty 3

## 2017-07-22 MED ORDER — NICOTINE 14 MG/24HR TD PT24
14.0000 mg | MEDICATED_PATCH | Freq: Every day | TRANSDERMAL | Status: DC
  Filled 2017-07-22: qty 1

## 2017-07-22 NOTE — Progress Notes (Addendum)
[place] TEAM 1 - Stepdown/ICU TEAM  Shelley [name]  ACZ:[id number] DOB: 1946-01-22 DOA: 07/12/2017 PCP: [name], MD    Brief Narrative:  72yo F w/ a Hx of Alcohol abuse, Anxiety, CVA, Seizures, HTN, dementia, and Osteoporosis who presented w/ left foot pain.    Significant Events: 1/10 admit w/ L leg pain - taken to OR emergently w/ extensive thrombectomy - L IJ CVL 1/11 L AKA and L common femoral thrombectomy  1/17 L thoracentesis in IR > 569ml - transudate   Subjective: The patient's nurse has noted that she continues to apparently aspirate with virtually all intake.  The patient herself is alert and interactive.  Again she tells me "I feel like shit" which is her typical greeting.  She does not appear to be uncomfortable nor is she in apparent acute distress.    Assessment & Plan:  L Effusion - Aspiration pneumonitis/pneumonia - acute hypoxic respiratory failure- recurring aspiration Progressively worsening - hx most c/w recurring aspiration - current abx not proving helpful therefore currently off abx - this appears to be an untreatable issue - Palliative Care consulted to assist in clarifying goals of care   Dysphagia - recurring aspiration Pt has refused MBS or FEES - appears to be aspirating all consistencies tried at bedside - there is no "safe" diet to allow her at this time, but she will not tolerate/allow an NG feeding tube - I spoke w/ her husband 10/18 and explained our predicament, and we agreed that allowing her to eat while accepting the risk of aspiration was the best course of action - her dementia appears to have progressed to the early end-stages such that her ability to safely maintain nourishment is markedly impaired  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 as per [facility] - appears to be stable from this standpoint   Persistent atrial fibrillation HR remains suboptimally controlled - pt often refusing meds - heparin gtt has been stopped due to acute worsening of anemia  (>2g Hgb drop)   Anemia of unclear etiology  No signs of overt bleeding - occult blood still pending - LDH and Haptoglobin not c/w hemolysis - responded better than expected to 2U PRBC - CT abd w/o evidence of RPH - Hgb remains stable    Recent Labs  Lab 07/19/17 0500 07/19/17 0923 07/20/17 0205 07/21/17 0425 07/22/17 0522  HGB 5.9* 6.4* 9.9* 10.8* 10.2*    Altered mental status - vascular dementia - early end-stage dementia  patient is uncooperative - she is alert and answers orientation questions correctly, but is confused when questioned in more detail - I suspect a baseline cognitive impairment - CT head revealed no CVA but has confirmed findings c/w severe vascular mediated dementia   Chronic grade 2 diastolic CHF (per TTE Dec 2018) Appears euvolemic at present - stop IVF and follow intake  Filed Weights   07/13/17 1741 07/14/17 0545 07/22/17 0447  Weight: 69.1 kg (152 lb 5.4 oz) 67.3 kg (148 lb 5.9 oz) 69.7 kg (153 lb 10.6 oz)    Hypotension  Resolved w/ simple volume expansion   RUL nodular densities CT angio noted spiculated mass 2 - PET recommended as outpt - unclear if cytology was sent on thora fluid - given progressing dementia and issues discussed above further evaluation would not be appropriate or helpful   Tobacco dependence Continue nicotine patch  Hx CVA   Thrombocytopenia (baseline~180) Likely due to EtOH abuse - improving   EtOH abuse Now beyond the  point at which ongoing withdrawal would be expected   Moderate malnutrition in context of acute illness/injury  DVT prophylaxis: SCDs Code Status: FULL CODE Family Communication: no family present at time of exam - spoke w/ husband via phone 1/18 Disposition Plan: SDU   Consultants:  [facility] PCCM Cardiology Palliative Care   Antimicrobials:  Cefepime 1/14 > 1/18 Vancomycin 1/14 > 1/18   Objective: Blood pressure 113/80, pulse 98, temperature 98.2 F (36.8 C), temperature source Axillary,  resp. rate 20, height 5\' 5"  (1.651 m), weight 69.7 kg (153 lb 10.6 oz), SpO2 98 %.  Intake/Output Summary (Last 24 hours) at 07/22/2017 0952 Last data filed at 07/22/2017 0448 Gross per 24 hour  Intake 735 ml  Output 500 ml  Net 235 ml   Filed Weights   07/13/17 1741 07/14/17 0545 07/22/17 0447  Weight: 69.1 kg (152 lb 5.4 oz) 67.3 kg (148 lb 5.9 oz) 69.7 kg (153 lb 10.6 oz)    Examination: General: no acute distress but requiring high level O2 support Lungs: no air movement in the L base - course upper airway sounds transmitted th/o other fields  Cardiovascular: irreg irreg - rate ~100 - no M  Abdomen: NT/ND, soft, bs+, no rebound  Extremities: L AKA site dressed and dry - no edema R LE - some modest bruising in L groin region   CBC: Recent Labs  Lab 07/18/17 0716 07/19/17 0500 07/19/17 0923 07/20/17 0205 07/21/17 0425 07/22/17 0522  WBC 15.3* 12.5*  --  17.7* 17.3* 14.0*  HGB 8.1* 5.9* 6.4* 9.9* 10.8* 10.2*  HCT 24.5* 18.2* 19.6* 30.2* 32.4* 31.9*  MCV 94.6 92.9  --  86.0 87.6 90.1  PLT 159 162  --  100* 188 242   Basic Metabolic Panel: Recent Labs  Lab 07/17/17 0335 07/18/17 0716 07/18/17 1611 07/19/17 0500 07/20/17 0205 07/21/17 0425 07/22/17 0522  NA 137 140 143  --   --  138 143  K 3.7 3.6 3.9  --   --  3.7 4.0  CL 106 107 110  --   --  100* 106  CO2 23 22 26   --   --  24 26  GLUCOSE 142* 120* 145*  --   --  114* 111*  BUN 13 13 12   --   --  11 20  CREATININE 0.56 0.55 0.58  --   --  0.60 0.52  CALCIUM 7.9* 7.8* 7.5*  --   --  8.4* 8.3*  MG 1.8 1.6*  --  2.1 1.8 1.7  --    GFR: Estimated Creatinine Clearance: 62.3 mL/min (by C-G formula based on SCr of 0.52 mg/dL).  Liver Function Tests: Recent Labs  Lab 07/17/17 0335 07/18/17 0716 07/21/17 0425  AST 28 28 33  ALT 17 17 22   ALKPHOS 191* 164* 174*  BILITOT 1.1 1.0 1.9*  PROT 4.3* 4.3* 5.4*  ALBUMIN 2.1* 2.0* 2.6*   HbA1C: Hgb A1c MFr Bld  Date/Time Value Ref Range Status  06/09/2017 05:13  AM 5.6 4.8 - 5.6 % Final    Comment:    (NOTE) Pre diabetes:          5.7%-6.4% Diabetes:              >6.4% Glycemic control for   <7.0% adults with diabetes      Recent Results (from the past 240 hour(s))  MRSA PCR Screening     Status: None   Collection Time: 07/12/17 11:20 PM  Result Value Ref Range  Status   MRSA by PCR NEGATIVE NEGATIVE Final    Comment:        The GeneXpert MRSA Assay (FDA approved for NASAL specimens only), is one component of a comprehensive MRSA colonization surveillance program. It is not intended to diagnose MRSA infection nor to guide or monitor treatment for MRSA infections.   Surgical pcr screen     Status: None   Collection Time: 07/13/17 11:15 AM  Result Value Ref Range Status   MRSA, PCR NEGATIVE NEGATIVE Final   Staphylococcus aureus NEGATIVE NEGATIVE Final    Comment: (NOTE) The Xpert SA Assay (FDA approved for NASAL specimens in patients 43 years of age and older), is one component of a comprehensive surveillance program. It is not intended to diagnose infection nor to guide or monitor treatment.   Culture, blood (routine x 2) Call MD if unable to obtain prior to antibiotics being given     Status: None (Preliminary result)   Collection Time: 07/17/17  3:35 AM  Result Value Ref Range Status   Specimen Description BLOOD LEFT HAND  Final   Special Requests IN PEDIATRIC BOTTLE Blood Culture adequate volume  Final   Culture NO GROWTH 4 DAYS  Final   Report Status PENDING  Incomplete  Culture, blood (routine x 2) Call MD if unable to obtain prior to antibiotics being given     Status: None (Preliminary result)   Collection Time: 07/17/17  3:36 AM  Result Value Ref Range Status   Specimen Description BLOOD LEFT HAND  Final   Special Requests IN PEDIATRIC BOTTLE Blood Culture adequate volume  Final   Culture NO GROWTH 4 DAYS  Final   Report Status PENDING  Incomplete  Gram stain     Status: None   Collection Time: 07/19/17  4:17 PM    Result Value Ref Range Status   Specimen Description PLEURAL  Final   Special Requests NONE  Final   Gram Stain   Final    CYTOSPIN SMEAR WBC PRESENT, PREDOMINANTLY PMN NO ORGANISMS SEEN    Report Status 07/19/2017 FINAL  Final  Culture, body fluid-bottle     Status: None (Preliminary result)   Collection Time: 07/19/17  4:17 PM  Result Value Ref Range Status   Specimen Description PLEURAL  Final   Special Requests NONE  Final   Culture NO GROWTH 2 DAYS  Final   Report Status PENDING  Incomplete  MRSA PCR Screening     Status: None   Collection Time: 07/21/17  7:21 PM  Result Value Ref Range Status   MRSA by PCR NEGATIVE NEGATIVE Final    Comment:        The GeneXpert MRSA Assay (FDA approved for NASAL specimens only), is one component of a comprehensive MRSA colonization surveillance program. It is not intended to diagnose MRSA infection nor to guide or monitor treatment for MRSA infections.      Scheduled Meds: . atorvastatin  20 mg Oral Daily  . chlorhexidine  15 mL Mouth Rinse BID  . Chlorhexidine Gluconate Cloth  6 each Topical Daily  . diltiazem  90 mg Oral Q8H  . feeding supplement (ENSURE ENLIVE)  237 mL Oral TID BM  . folic acid  1 mg Oral Daily  . levalbuterol  0.63 mg Nebulization TID  . levETIRAcetam  500 mg Oral BID  . magnesium oxide  400 mg Oral BID  . mouth rinse  15 mL Mouth Rinse q12n4p  . multivitamin with minerals  1  tablet Oral Daily  . nicotine  21 mg Transdermal Daily  . sodium chloride flush  10-40 mL Intracatheter Q12H  . thiamine  100 mg Oral Daily     LOS: 10 days   Cherene Altes, MD [facility] Office  [phone] Pager - Text Page per Amion as per below:  On-Call/Text Page:      [email or web address]      password [email or web address]  If 7PM-7AM, please contact night-coverage [email or web address] Password [facility] 07/22/2017, 9:52 AM

## 2017-07-22 NOTE — Consult Note (Signed)
Consultation Note Date: 07/22/2017   Patient Name: Shelley [name]  DOB: [birth year]  MRN: [id number]  Age / Sex: 72 y.o., female  PCP: [name], MD Referring Physician: Cherene Altes, MD  Reason for Consultation: Establishing goals of care and Psychosocial/spiritual support  HPI/Patient Profile: 72 y.o. female  with past medical history of hypertension, seizure disorder, CVA, diastolic heart failure grade 2, osteoporosis, alcohol use disorder, severe vascular dementia, thrombocytopenia, lung nodule, atrial fib admitted on 07/12/2017 with leg pain.  Patient was taken to the OR emergently with extensive thrombectomy.  On 07/13/2017 she underwent a left AKA, femoral thrombectomy.  On 07/19/2017 secondary to worsening dyspnea, underwent left thoracentesis for 550 cc.  Patient is currently aspirating.  Patient is refusing some aspects of treatment.  Consult for goals of care.   Clinical Assessment and Goals of Care: Chart reviewed, met with patient.  Per chart review, Dr. Thereasa Solo has spoken to patient's husband, and because of the severity of her aspiration pneumonia they have elected to let patient eat despite ongoing aspiration risk.  Per speech evaluation, altering texture of p.o. intake is likely not to help.  She is oriented to herself, recognizes that she is in the hospital and does remember undergoing an amputation.  I shared with her that that must been a very hard thing to go through and she stated "I did not have much choice".  She is denying pain at amputation site, describes just feeling generally uncomfortable and unwell: "I feel like I am 89"  Patient's healthcare proxy would be her husband, Rochanda Harpham at [phone]  Met with patient and husband later in the afternoon.  Patient's breathing appears more labored than this morning.  Husband reports that she has been short of breath most of the  afternoon.  Introduced palliative medicine team to patient and husband as an Chief Executive Officer and source of support which can focus on both medical information, education as well as addressing quality of life issues in the setting of severe vascular disease.  The patient and husband both report that she has had a declining quality of life for 3 years prior to amputation.  She tells me "I just reached the point where I did not care".  Husband states she just "stayed home and smoked cigarettes".     SUMMARY OF RECOMMENDATIONS   DNR Palliative medicine services introduced.  Will continue to be a source of support and work with patient and family regarding goals of care. Code Status/Advance Care Planning:  DNR    Symptom Management:   Pain: Continue with morphine IV 1-2 mg every 2 hours as needed; oxycodone 5-10 mg orally every 6 hours as needed.  Per chart review patient is utilized 5 mg of oxycodone 4 hours  Agitation: He with Ativan 1-2 mg IV every 4 hours as needed, Haldol 2-5 mg IV every 6 hours as needed  Dyspnea: Continue with targeted pulmonary treatments such as oxygen, nebulizer treatments but also utilization of opioids for severe dyspnea  Palliative Prophylaxis:  Aspiration, Bowel Regimen, Delirium Protocol, Eye Care, Frequent Pain Assessment, Oral Care and Turn Reposition    Psycho-social/Spiritual:   Desire for further Chaplaincy support:no  Additional Recommendations: Referral to Community Resources   Prognosis:   Unable to determine  Discharge Planning: To Be Determined      Primary Diagnoses: Present on Admission: . Thromboembolism ([place]) . 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 The following aspects are pertinent.  Past Medical History:  Diagnosis Date  . Alcohol abuse    drinks awhile   . Anxiety   . CVA (cerebral infarction)     pt denies  . Hypertension   . Osteoporosis   . Seizures ([place])    Social History   Socioeconomic History  . Marital status: Married    Spouse name: None  . Number of children: None  . Years of education: None  . Highest education level: None  Social Needs  . Financial resource strain: None  . Food insecurity - worry: None  . Food insecurity - inability: None  . Transportation needs - medical: None  . Transportation needs - non-medical: None  Occupational History  . None  Tobacco Use  . Smoking status: Current Some Day Smoker    Packs/day: 0.50    Types: Cigarettes  . Smokeless tobacco: Never Used  Substance and Sexual Activity  . Alcohol use: No    Comment: hx of ETOH abuse  . Drug use: No  . Sexual activity: None  Other Topics Concern  . None  Social History Narrative  . None   Family History  Problem Relation Age of Onset  . Colon cancer Neg Hx    Scheduled Meds: . atorvastatin  20 mg Oral Daily  . chlorhexidine  15 mL Mouth Rinse BID  . Chlorhexidine Gluconate Cloth  6 each Topical Daily  . diltiazem  90 mg Oral Q8H  . feeding supplement (ENSURE ENLIVE)  237 mL Oral TID BM  . folic acid  1 mg Oral Daily  . levalbuterol  0.63 mg Nebulization TID  . levETIRAcetam  500 mg Oral BID  . magnesium oxide  400 mg Oral BID  . mouth rinse  15 mL Mouth Rinse q12n4p  . multivitamin with minerals  1 tablet Oral Daily  . nicotine  21 mg Transdermal Daily  . sodium chloride flush  10-40 mL Intracatheter Q12H  . thiamine  100 mg Oral Daily   Continuous Infusions: . sodium chloride 60 mL/hr at 07/22/17 0412   PRN Meds:.alum & mag hydroxide-simeth, haloperidol lactate, lidocaine, LORazepam, metoprolol tartrate, morphine injection, ondansetron, oxyCODONE, polyethylene glycol, RESOURCE THICKENUP CLEAR, sodium chloride flush Medications Prior to Admission:  Prior to Admission medications   Medication Sig Start Date End Date Taking? Authorizing Provider  amLODipine (NORVASC)  10 MG tablet Take 1 tablet (10 mg total) by mouth daily. 06/29/17 06/29/18 Yes Angiulli, Lavon Paganini, PA-C  atorvastatin (LIPITOR) 20 MG tablet Take 1 tablet (20 mg total) by mouth daily. 06/29/17 06/29/18 Yes Angiulli, Lavon Paganini, PA-C  clopidogrel (PLAVIX) 75 MG tablet Take 1 tablet (75 mg total) by mouth daily. 06/29/17  Yes Angiulli, Lavon Paganini, PA-C  levETIRAcetam (KEPPRA) 500 MG tablet Take 1 tablet (500 mg total) by mouth 2 (two) times daily. 06/29/17  Yes Angiulli, Lavon Paganini, PA-C  lisinopril (PRINIVIL,ZESTRIL) 10 MG tablet Take 1 tablet (10 mg total) by mouth daily. 06/29/17  Yes  Angiulli, Lavon Paganini, PA-C  metoprolol tartrate (LOPRESSOR) 25 MG tablet Take 1 tablet (25 mg total) by mouth 2 (two) times daily. 06/29/17  Yes Angiulli, Lavon Paganini, PA-C  naproxen sodium (ALEVE) 220 MG tablet Take 440 mg by mouth 2 (two) times daily as needed (pain/headache).   Yes [provider]  nicotine (NICODERM CQ - DOSED IN MG/24 HOURS) 21 mg/24hr patch 21 mg patch daily times one week then 14 mg patch daily 3 weeks then 7 mg patch daily 3 weeks and stop Patient taking differently: Place 21 mg onto the skin daily. Prescribed 06/29/17 - apply 21 mg patch to skin daily for one week, then purchase and use 14 mg patch daily for 3 weeks weeks,  Then purchase and use 7 mg patch daily for 3 weeks, then stop 06/29/17  Yes Angiulli, Lavon Paganini, PA-C  magnesium oxide (MAG-OX) 400 MG tablet Take 1 tablet (400 mg total) by mouth 2 (two) times daily. 06/12/17   Georgette Shell, MD   Allergies  Allergen Reactions  . Acyclovir And Related    Review of Systems  Unable to perform ROS: Acuity of condition    Physical Exam  Constitutional:  Frail, older female, appears acutely ill  HENT:  Head: Normocephalic and atraumatic.  Cardiovascular:  Atrial fib, tachycardic  Pulmonary/Chest:  Wearing nonrebreather  Neurological:  Patient is somnolent but arouses easily to voice She remembers having an amputation and  recognizes that she is in the hospital  Skin: Skin is warm and dry. There is pallor.  Psychiatric:  Confused  Nursing note and vitals reviewed.   Vital Signs: BP 113/80 (BP Location: Right Arm)   Pulse 98   Temp 98.2 F (36.8 C) (Axillary)   Resp 20   Ht '5\' 5"'  (1.651 m)   Wt 69.7 kg (153 lb 10.6 oz)   SpO2 98%   BMI 25.57 kg/m  Pain Assessment: No/denies pain   Pain Score: 0-No pain   SpO2: SpO2: 98 % O2 Device:SpO2: 98 % O2 Flow Rate: .O2 Flow Rate (L/min): 15 L/min  IO: Intake/output summary:   Intake/Output Summary (Last 24 hours) at 07/22/2017 1053 Last data filed at 07/22/2017 5329 Gross per 24 hour  Intake 745 ml  Output 500 ml  Net 245 ml    LBM: Last BM Date: 07/21/17 Baseline Weight: Weight: 69.1 kg (152 lb 5.4 oz) Most recent weight: Weight: 69.7 kg (153 lb 10.6 oz)     Palliative Assessment/Data:   Flowsheet Rows     Most Recent Value  Intake Tab  Referral Department  Hospitalist  Unit at Time of Referral  Intermediate Care Unit  Palliative Care Primary Diagnosis  Cardiac  Date Notified  07/21/17  Palliative Care Type  New Palliative care  Reason for referral  Clarify Goals of Care  Date of Admission  07/12/17  Date first seen by Palliative Care  07/22/17  # of days Palliative referral response time  1 Day(s)  # of days IP prior to Palliative referral  9  Clinical Assessment  Palliative Performance Scale Score  30%  Pain Max last 24 hours  Not able to report  Pain Min Last 24 hours  Not able to report  Dyspnea Max Last 24 Hours  Not able to report  Dyspnea Min Last 24 hours  Not able to report  Nausea Max Last 24 Hours  Not able to report  Nausea Min Last 24 Hours  Not able to report  Anxiety Max Last 24  Hours  Not able to report  Anxiety Min Last 24 Hours  Not able to report  Other Max Last 24 Hours  Not able to report  Psychosocial & Spiritual Assessment  Palliative Care Outcomes  Patient/Family meeting held?  No  Palliative Care  follow-up planned  Yes, Facility      Time In: 1500 Time Out: 1610 Time Total: 70 min Greater than 50%  of this time was spent counseling and coordinating care related to the above assessment and plan.  Signed by: Dory Horn, NP   Please contact Palliative Medicine Team phone at [phone] for questions and concerns.  For individual provider: See Shea Evans

## 2017-07-22 NOTE — Progress Notes (Signed)
SLP Cancellation Note  Patient Details Name: Shelley [name] MRN: [id number] DOB: 10/22/1945   Cancelled treatment:       Reason Eval/Treat Not Completed: Medical issues which prohibited therapy. Pt on non-rebreather. Spoke with RN; will f/u for readiness for instrumental assessment.  Deneise Lever, [place], [place] Speech-Language Pathologist [place] 07/22/2017, 7:57 AM

## 2017-07-22 NOTE — Progress Notes (Signed)
SLP Cancellation Note  Patient Details Name: Shelley [name] MRN: [id number] DOB: [birth year]  Spoke with RN, requesting whether to hold PO for pt. Advised that SLP is unable to make a diet recommendation for pt without completing instrumental exam; pt remains on NRB and unable to complete at this time. Per notes, Dr. Thereasa Solo and pt's husband discussed risks of aspiration and pt's husband accepting of risk to continue eating and drinking, and dys 1/nectar diet was initiated per MD. Strongly suspect pt aspirating, given overt signs of aspiration at bedside, febrile, and recent CXR findings. Will continue to defer diet order to MD, however advised that with decreased respiratory status pt is at high risk for aspiration. Will f/u for instrumental exam when medically appropriate.  Deneise Lever, [place], [place] Speech-Language Pathologist [place] 07/22/2017, 10:16 AM

## 2017-07-23 ENCOUNTER — Telehealth: Payer: Self-pay | Admitting: *Deleted

## 2017-07-23 DIAGNOSIS — R1310 Dysphagia, unspecified: Secondary | ICD-10-CM

## 2017-07-23 DIAGNOSIS — N186 End stage renal disease: Secondary | ICD-10-CM

## 2017-07-23 DIAGNOSIS — Z992 Dependence on renal dialysis: Secondary | ICD-10-CM

## 2017-07-23 LAB — COMPREHENSIVE METABOLIC PANEL
ALK PHOS: 145 U/L — AB (ref 38–126)
ALT: 22 U/L (ref 14–54)
AST: 24 U/L (ref 15–41)
Albumin: 2.5 g/dL — ABNORMAL LOW (ref 3.5–5.0)
Anion gap: 9 (ref 5–15)
BUN: 14 mg/dL (ref 6–20)
CALCIUM: 8.7 mg/dL — AB (ref 8.9–10.3)
CO2: 24 mmol/L (ref 22–32)
CREATININE: 0.47 mg/dL (ref 0.44–1.00)
Chloride: 111 mmol/L (ref 101–111)
GFR calc non Af Amer: >60 mL/min (ref >60)
Glucose, Bld: 122 mg/dL — ABNORMAL HIGH (ref 65–99)
Potassium: 4.3 mmol/L (ref 3.5–5.1)
SODIUM: 144 mmol/L (ref 135–145)
Total Bilirubin: 1.1 mg/dL (ref 0.3–1.2)
Total Protein: 5.1 g/dL — ABNORMAL LOW (ref 6.5–8.1)

## 2017-07-23 LAB — CBC
HCT: 33.2 % — ABNORMAL LOW (ref 36.0–46.0)
Hemoglobin: 10.5 g/dL — ABNORMAL LOW (ref 12.0–15.0)
MCH: 28.8 pg (ref 26.0–34.0)
MCHC: 31.6 g/dL (ref 30.0–36.0)
MCV: 91 fL (ref 78.0–100.0)
PLATELETS: 232 10*3/uL (ref 150–400)
RBC: 3.65 MIL/uL — AB (ref 3.87–5.11)
RDW: 21 % — AB (ref 11.5–15.5)
WBC: 12.7 10*3/uL — ABNORMAL HIGH (ref 4.0–10.5)

## 2017-07-23 LAB — CHOLESTEROL, BODY FLUID: Cholesterol, Fluid: 16 mg/dL

## 2017-07-23 MED ORDER — ACETAMINOPHEN 650 MG RE SUPP
650.0000 mg | Freq: Four times a day (QID) | RECTAL | Status: DC | PRN
  Filled 2017-07-23: qty 1

## 2017-07-23 MED ORDER — ACETAMINOPHEN 325 MG PO TABS
650.0000 mg | ORAL_TABLET | Freq: Four times a day (QID) | ORAL | Status: DC | PRN
  Administered 2017-07-23 – 2017-07-26 (×3): 650 mg via ORAL
  Filled 2017-07-23 (×3): qty 2

## 2017-07-23 MED ORDER — DILTIAZEM HCL 60 MG PO TABS
120.0000 mg | ORAL_TABLET | Freq: Three times a day (TID) | ORAL | Status: AC
  Administered 2017-07-23 – 2017-07-24 (×5): 120 mg via ORAL
  Filled 2017-07-23 (×5): qty 2

## 2017-07-23 NOTE — Progress Notes (Signed)
[facility] Progress Note Patient Details:  Shelley [name] 1945/07/23 [id number]  Patient ID: Shelley [name], female   DOB: 1946-01-02, 72 y.o.   MRN: [id number]   Shelley [name] 07/23/2017, 11:34 AMCalled [employer] for Left stump shrinnker.

## 2017-07-23 NOTE — Care Management Important Message (Signed)
Important Message  Patient Details  Name: Shelley [name] MRN: [id number] Date of Birth: [birth year]   Medicare Important Message Given:  Yes    Zenon Mayo, RN 07/23/2017, 4:06 PM

## 2017-07-23 NOTE — Progress Notes (Signed)
Nutrition Follow Up  DOCUMENTATION CODES:   Non-severe (moderate) malnutrition in context of acute illness/injury  INTERVENTION:    Continue Ensure Enlive po TID, each supplement provides 350 kcal and 20 grams of protein  NUTRITION DIAGNOSIS:   Moderate Malnutrition related to acute illness(stroke 1.5 months ago) as evidenced by mild muscle depletion, percent weight loss(7% weight loss within 1 month), ongoing  GOAL:   Patient will meet greater than or equal to 90% of their needs, unmet  MONITOR:   PO intake, Supplement acceptance, Labs, Skin, Weight trends, I & O's  ASSESSMENT:   72 yo female with PMH of CVA, osteoporosis, alcohol abuse, HTN, anxiety, seizures who was admitted on 1/10 with L foot pain; s/p L femoral  thrombectomy and L AKA on 1/11.  S/p thoracentesis in IR 1/17  Pt a little confused upon RD visit. Breakfast meal untouched on tray table. Pt declining MBS for objective swallow assessment.  PO intake poor at 35% per flowsheet records. Palliative Medicine Team following for [place]. Labs and medications reviewed.  Of note pt has refused NGT placement for nutrition support.  Diet Order:  DIET - DYS 1 Room service appropriate? Yes; Fluid consistency: Nectar Thick  EDUCATION NEEDS:   No education needs have been identified at this time  Skin:  Skin Assessment: Skin Integrity Issues: Skin Integrity Issues:: Incisions Incisions: L leg AKA  Last BM:  1/19   Intake/Output Summary (Last 24 hours) at 07/23/2017 1424 Last data filed at 07/22/2017 2146 Gross per 24 hour  Intake 10 ml  Output 850 ml  Net -840 ml   Height:   Ht Readings from Last 1 Encounters:  07/13/17 5\' 5"  (1.651 m)    Weight:   Wt Readings from Last 1 Encounters:  07/22/17 153 lb 10.6 oz (69.7 kg)    Ideal Body Weight:  52.3 kg  BMI:  26.8 (adjusted for AKA)  Estimated Nutritional Needs:   Kcal:  1800-2000  Protein:  90-110 gm  Fluid:  >/= 1.8 L  Arthur Holms, RD,  LDN Pager #: [phone] After-Hours Pager #: [phone]

## 2017-07-23 NOTE — Progress Notes (Addendum)
  Speech Language Pathology Treatment: Dysphagia  Patient Details Name: Shelley [name] MRN: [id number] DOB: [birth year] Today's Date: 07/23/2017 Time: 6063-0160 SLP Time Calculation (min) (ACUTE ONLY): 20 min  Assessment / Plan / Recommendation Clinical Impression  Pt seen at bedside for assessment of diet tolerance and education. RN reports pt continues to decline participation in [facility] for objective assessment of swallow function/safety and ID of least restrictive diet. Pt was observed with nectar thick liquids, and did not exhibit overt s/s aspiration at this time. Objective study continues to be recommended to assess aspiration risk and identify least restrictive diet, however, despite SLP explanation and encouragement, pt continues to decline participation in [facility].  Will continue current conservative diet of puree and nectar thick liquids, which pt agrees with at this time.   Anticipate ST will sign off soon if pt continues to take po with known aspiration risk and refuses objective study, as pt has received education from multiple sources on this issue.   HPI HPI: Pt is a 72 y.o. female with PMH of Alcohol abuse, Anxiety, CVA (cerebral infarction), Hypertension, Osteoporosis, and Seizures and recent admission 06/08/17 for CVA (L dorsal pons, R cerebellum, and R thalamus, no dysphagia noted at that time). 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 Postoperatively had had respiratory failure requiring hi-flow O2 as well as A-fib w/ RVR. and post-operative anemia and hypotension.       SLP Plan  Continue with current plan of care       Recommendations  Diet recommendations: Dysphagia 1 (puree);Nectar-thick liquid Liquids provided via: Cup;No straw Medication Administration: Via alternative means Supervision: Full supervision/cueing for compensatory strategies;Patient able to self feed Compensations:  Minimize environmental distractions;Slow rate;Small sips/bites Postural Changes and/or Swallow Maneuvers: Seated upright 90 degrees                Oral Care Recommendations: Oral care QID Follow up Recommendations: Skilled Nursing facility SLP Visit Diagnosis: Dysphagia, oropharyngeal phase (R13.12) Plan: Continue with current plan of care             Shelley [name] Shelley [name] [facility], CCC-SLP Speech Language Pathologist [phone]  Shonna Chock 07/23/2017, 10:07 AM

## 2017-07-23 NOTE — Progress Notes (Signed)
Physical Therapy Treatment Patient Details Name: Shelley [name] MRN: [id number] DOB: [birth year] Today's Date: 07/23/2017    History of Present Illness NIALA STCHARLES is a 72 y.o. female with PMH of Alcohol abuse, Anxiety, CVA (cerebral infarction), Hypertension, Osteoporosis, and Seizures and recent admission 06/08/17 for CVA. 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  Postoperatively had had respiratory failure requiring hi-flow O2 as well as A-fib w/ RVR. and post-operative anemia and hypotension.      PT Comments    Patient reluctant to participate in therapies today. Educated on importance of upright activity tolerance. Eventually agreeable to OOB to chair with assist. +2 draw sheet transfer a/p to chair. Tolerated well with O2 saturations 90%. Nsg tech assisted with transfer and educated on technique for return to bed. Goal 1 hour OOB activity tolerance.   Follow Up Recommendations  SNF;Supervision/Assistance - 24 hour     Equipment Recommendations  Wheelchair (measurements PT);Wheelchair cushion (measurements PT)    Recommendations for Other Services Rehab consult;OT consult     Precautions / Restrictions Precautions Precautions: Fall Precaution Comments: L AKA Restrictions Weight Bearing Restrictions: Yes LLE Weight Bearing: Non weight bearing    Mobility  Bed Mobility Overal bed mobility: Needs Assistance Bed Mobility: Rolling;Supine to Sit Rolling: Min assist   Supine to sit: Max assist;+2 for physical assistance;HOB elevated     General bed mobility comments: use of draw sheet support in sitting  Transfers Overall transfer level: Needs assistance   Transfers: Comptroller transfers: Total assist;+2 physical assistance   General transfer comment: draw sheet transfer OOB to chair   Ambulation/Gait                  Stairs            Wheelchair Mobility    Modified Rankin (Stroke Patients Only)       Balance                                            Cognition Arousal/Alertness: Awake/alert Behavior During Therapy: Flat affect Overall Cognitive Status: Impaired/Different from baseline Area of Impairment: Attention;Memory;Safety/judgement;Awareness;Problem solving                 Orientation Level: Situation;Time;Place Current Attention Level: Sustained Memory: Decreased short-term memory Following Commands: Follows one step commands with increased time(repetition and multimodal cues) Safety/Judgement: Decreased awareness of safety;Decreased awareness of deficits Awareness: Intellectual Problem Solving: Slow processing;Decreased initiation;Difficulty sequencing;Requires verbal cues;Requires tactile cues        Exercises      General Comments        Pertinent Vitals/Pain Pain Assessment: No/denies pain    Home Living                      Prior Function            PT Goals (current goals can now be found in the care plan section) Acute Rehab PT Goals Patient Stated Goal: To get stronger PT Goal Formulation: With family Time For Goal Achievement: 07/31/17 Potential to Achieve Goals: Fair Progress towards PT goals: Progressing toward goals(modest progression for OOB tolerance)    Frequency    Min 2X/week      PT Plan Current  plan remains appropriate    Co-evaluation              AM-PAC PT "6 Clicks" Daily Activity  Outcome Measure  Difficulty turning over in bed (including adjusting bedclothes, sheets and blankets)?: Unable Difficulty moving from lying on back to sitting on the side of the bed? : Unable Difficulty sitting down on and standing up from a chair with arms (e.g., wheelchair, bedside commode, etc,.)?: Unable Help needed moving to and from a bed to chair (including a wheelchair)?: Total Help needed  walking in hospital room?: Total Help needed climbing 3-5 steps with a railing? : Total 6 Click Score: 6    End of Session Equipment Utilized During Treatment: Oxygen Activity Tolerance: Patient limited by fatigue Patient left: in chair;with call bell/phone within reach Nurse Communication: Mobility status PT Visit Diagnosis: Muscle weakness (generalized) (M62.81);Difficulty in walking, not elsewhere classified (R26.2);Pain Pain - Right/Left: Left Pain - part of body: Leg     Time: 3903-0092 PT Time Calculation (min) (ACUTE ONLY): 17 min  Charges:  $Therapeutic Activity: 8-22 mins                    G Codes:       Alben Deeds, PT DPT  Board Certified Neurologic Specialist [place] 07/23/2017, 5:09 PM

## 2017-07-23 NOTE — Progress Notes (Signed)
[place] TEAM 1 - Stepdown/ICU TEAM  Shelley [name]  OFB:[id number] DOB: 1946/02/02 DOA: 07/12/2017 PCP: [name], MD    Brief Narrative:  72yo F w/ a Hx of Alcohol abuse, Anxiety, CVA, Seizures, HTN, dementia, and Osteoporosis who presented w/ left foot pain.    Significant Events: 1/10 admit w/ L leg pain - taken to OR emergently w/ extensive thrombectomy - L IJ CVL 1/11 L AKA and L common femoral thrombectomy  1/17 L thoracentesis in IR > 570ml - transudate   Subjective: Patient is resting comfortably in a bedside chair.  Again she tells me she "feels like shit" but she actually appears to be resting relatively comfortably.  She continues to require high level oxygen support.  She does not appear to be in pain.  She denies chest pain shortness of breath nausea or vomiting.  She tells me she has very poor appetite and poor intake.  Assessment & Plan:  L Effusion - Aspiration pneumonitis/pneumonia - acute hypoxic respiratory failure- recurring aspiration Progressively worsening - hx most c/w recurring aspiration - current abx not proving helpful therefore currently off abx - this appears to be an untreatable issue - Palliative Care consulted to assist in establishing goals of care   Dysphagia - recurring aspiration Pt has refused MBS or FEES - appears to be aspirating all consistencies tried at bedside - there is no "safe" diet to allow her at this time, but she will not tolerate/allow an NG feeding tube - I spoke w/ her husband 10/18 and explained our predicament, and we agreed that allowing her to eat while accepting the risk of aspiration was the best course of action - her dementia appears to have progressed to the early end-stages such that her ability to safely maintain nourishment is markedly impaired - this has been discussed w/ her as well, and she tells me she "just wants to be left alone"  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 as per VVS - appears to be stable from this  standpoint   Persistent atrial fibrillation HR remains suboptimally controlled - pt often refusing meds - heparin gtt has been stopped due to acute worsening of anemia (>2g Hgb drop) - Cards adjusting meds further   Anemia of unclear etiology  No signs of overt bleeding - occult blood still pending - LDH and Haptoglobin not c/w hemolysis - responded better than expected to 2U PRBC - CT abd w/o evidence of RPH - Hgb stable at this time     Recent Labs  Lab 07/19/17 0923 07/20/17 0205 07/21/17 0425 07/22/17 0522 07/23/17 0619  HGB 6.4* 9.9* 10.8* 10.2* 10.5*    Altered mental status - vascular dementia - early end-stage dementia  patient is uncooperative - she is alert and answers orientation questions correctly, but is confused when questioned in more detail - I suspect a baseline cognitive impairment - CT head revealed no CVA but has confirmed findings c/w severe vascular mediated dementia   Chronic grade 2 diastolic CHF (per TTE Dec 2018) Appears euvolemic at present - poor intake off IVF thus far   [facility] Weights   07/13/17 1741 07/14/17 0545 07/22/17 0447  Weight: 69.1 kg (152 lb 5.4 oz) 67.3 kg (148 lb 5.9 oz) 69.7 kg (153 lb 10.6 oz)    Hypotension  Resolved w/ simple volume expansion   RUL nodular densities CT angio noted spiculated mass 2 - PET recommended as outpt - unclear if cytology was sent on thora fluid -  given progressing dementia and issues discussed above further evaluation would not be appropriate or helpful   Tobacco dependence Continue nicotine patch  Hx CVA   Thrombocytopenia (baseline~180) Likely due to EtOH abuse - improving   EtOH abuse Now beyond the point at which ongoing withdrawal would be expected   Moderate malnutrition in context of acute illness/injury  DVT prophylaxis: SCDs Code Status: FULL CODE Family Communication: no family present at time of exam Disposition Plan: SDU   Consultants:  VVS PCCM Cardiology Palliative Care     Antimicrobials:  Cefepime 1/14 > 1/18 Vancomycin 1/14 > 1/18   Objective: Blood pressure 125/86, pulse (!) 105, temperature 98.2 F (36.8 C), temperature source Oral, resp. rate (!) 26, height 5\' 5"  (1.651 m), weight 69.7 kg (153 lb 10.6 oz), SpO2 94 %.  Intake/Output Summary (Last 24 hours) at 07/23/2017 1040 Last data filed at 07/22/2017 2146 Gross per 24 hour  Intake 10 ml  Output 850 ml  Net -840 ml   Filed Weights   07/13/17 1741 07/14/17 0545 07/22/17 0447  Weight: 69.1 kg (152 lb 5.4 oz) 67.3 kg (148 lb 5.9 oz) 69.7 kg (153 lb 10.6 oz)    Examination: General: no acute distress - still requiring high level O2 support Lungs: no air movement in the L base - no wheezing   Cardiovascular: irreg irreg - rate ~110 - no M  Abdomen: NT/ND, soft, bs+, no rebound  Extremities: L AKA site dressed/dry - no edema R LEregion   CBC: Recent Labs  Lab 07/19/17 0500 07/19/17 0923 07/20/17 0205 07/21/17 0425 07/22/17 0522 07/23/17 0619  WBC 12.5*  --  17.7* 17.3* 14.0* 12.7*  HGB 5.9* 6.4* 9.9* 10.8* 10.2* 10.5*  HCT 18.2* 19.6* 30.2* 32.4* 31.9* 33.2*  MCV 92.9  --  86.0 87.6 90.1 91.0  PLT 162  --  100* 188 208 315   Basic Metabolic Panel: Recent Labs  Lab 07/17/17 0335 07/18/17 0716 07/18/17 1611 07/19/17 0500 07/20/17 0205 07/21/17 0425 07/22/17 0522 07/23/17 0619  NA 137 140 143  --   --  138 143 144  K 3.7 3.6 3.9  --   --  3.7 4.0 4.3  CL 106 107 110  --   --  100* 106 111  CO2 23 22 26   --   --  24 26 24   GLUCOSE 142* 120* 145*  --   --  114* 111* 122*  BUN 13 13 12   --   --  11 20 14   CREATININE 0.56 0.55 0.58  --   --  0.60 0.52 0.47  CALCIUM 7.9* 7.8* 7.5*  --   --  8.4* 8.3* 8.7*  MG 1.8 1.6*  --  2.1 1.8 1.7  --   --    GFR: Estimated Creatinine Clearance: 62.3 mL/min (by C-G formula based on SCr of 0.47 mg/dL).  Liver Function Tests: Recent Labs  Lab 07/17/17 0335 07/18/17 0716 07/21/17 0425 07/23/17 0619  AST 28 28 33 24  ALT 17 17 22 22    ALKPHOS 191* 164* 174* 145*  BILITOT 1.1 1.0 1.9* 1.1  PROT 4.3* 4.3* 5.4* 5.1*  ALBUMIN 2.1* 2.0* 2.6* 2.5*   HbA1C: Hgb A1c MFr Bld  Date/Time Value Ref Range Status  06/09/2017 05:13 AM 5.6 4.8 - 5.6 % Final    Comment:    (NOTE) Pre diabetes:          5.7%-6.4% Diabetes:              >  6.4% Glycemic control for   <7.0% adults with diabetes      Recent Results (from the past 240 hour(s))  Surgical pcr screen     Status: None   Collection Time: 07/13/17 11:15 AM  Result Value Ref Range Status   MRSA, PCR NEGATIVE NEGATIVE Final   Staphylococcus aureus NEGATIVE NEGATIVE Final    Comment: (NOTE) The Xpert SA Assay (FDA approved for NASAL specimens in patients 25 years of age and older), is one component of a comprehensive surveillance program. It is not intended to diagnose infection nor to guide or monitor treatment.   Culture, blood (routine x 2) Call MD if unable to obtain prior to antibiotics being given     Status: None   Collection Time: 07/17/17  3:35 AM  Result Value Ref Range Status   Specimen Description BLOOD LEFT HAND  Final   Special Requests IN PEDIATRIC BOTTLE Blood Culture adequate volume  Final   Culture NO GROWTH 5 DAYS  Final   Report Status 07/22/2017 FINAL  Final  Culture, blood (routine x 2) Call MD if unable to obtain prior to antibiotics being given     Status: None   Collection Time: 07/17/17  3:36 AM  Result Value Ref Range Status   Specimen Description BLOOD LEFT HAND  Final   Special Requests IN PEDIATRIC BOTTLE Blood Culture adequate volume  Final   Culture NO GROWTH 5 DAYS  Final   Report Status 07/22/2017 FINAL  Final  Gram stain     Status: None   Collection Time: 07/19/17  4:17 PM  Result Value Ref Range Status   Specimen Description PLEURAL  Final   Special Requests NONE  Final   Gram Stain   Final    CYTOSPIN SMEAR WBC PRESENT, PREDOMINANTLY PMN NO ORGANISMS SEEN    Report Status 07/19/2017 FINAL  Final  Culture, body  fluid-bottle     Status: None (Preliminary result)   Collection Time: 07/19/17  4:17 PM  Result Value Ref Range Status   Specimen Description PLEURAL  Final   Special Requests NONE  Final   Culture NO GROWTH 3 DAYS  Final   Report Status PENDING  Incomplete  MRSA PCR Screening     Status: None   Collection Time: 07/21/17  7:21 PM  Result Value Ref Range Status   MRSA by PCR NEGATIVE NEGATIVE Final    Comment:        The GeneXpert MRSA Assay (FDA approved for NASAL specimens only), is one component of a comprehensive MRSA colonization surveillance program. It is not intended to diagnose MRSA infection nor to guide or monitor treatment for MRSA infections.      Scheduled Meds: . chlorhexidine  15 mL Mouth Rinse BID  . Chlorhexidine Gluconate Cloth  6 each Topical Daily  . diltiazem  120 mg Oral Q8H  . feeding supplement (ENSURE ENLIVE)  237 mL Oral TID BM  . levETIRAcetam  500 mg Oral BID  . mouth rinse  15 mL Mouth Rinse q12n4p  . sodium chloride flush  10-40 mL Intracatheter Q12H  . thiamine  100 mg Oral Daily     LOS: 11 days   Cherene Altes, MD [facility] Office  [phone] Pager - Text Page per Shea Evans as per below:  On-Call/Text Page:      [email or web address]      password [email or web address]  If 7PM-7AM, please contact night-coverage [email or web address] Password [email or web address] 07/23/2017, 10:40 AM

## 2017-07-23 NOTE — Progress Notes (Signed)
PULMONARY / CRITICAL CARE MEDICINE   Name: Shelley [name] MRN: [id number] DOB: [birth year]    ADMISSION DATE:  07/12/2017 CONSULTATION DATE:  07/12/2017  REFERRING MD:  Dr Bridgett Larsson  CHIEF COMPLAINT:  Left foot pain  SUBJECTIVE:  No acute change over weekend.  Denies dyspnea.  C/o headache. Now DNR.   VITAL SIGNS: BP 125/86   Pulse (!) 105   Temp 98.2 F (36.8 C) (Oral)   Resp (!) 26   Ht 5\' 5"  (1.651 m)   Wt 69.7 kg (153 lb 10.6 oz)   SpO2 94%   BMI 25.57 kg/m  Nasal cannula    INTAKE / OUTPUT: I/O last 3 completed shifts: In: 560 [I.V.:560] Out: 1050 [Urine:1050]  PHYSICAL EXAMINATION: General: Elderly female in NAD HEENT: Normocephalic atraumatic no jugular venous distention. Pulmonary: resps even non labored on [place], few scattered rhonchi R>L  Cardiac: IRIR no MRG, mild edema.  Abdomen: Soft nontender no organomegaly positive bowel sounds Extremities: Status post left AKA , no edema R Neuro: Weak, moves extremities, oriented x2  LABS:  BMET Recent Labs  Lab 07/21/17 0425 07/22/17 0522 07/23/17 0619  NA 138 143 144  K 3.7 4.0 4.3  CL 100* 106 111  CO2 24 26 24   BUN 11 20 14   CREATININE 0.60 0.52 0.47  GLUCOSE 114* 111* 122*   Electrolytes Recent Labs  Lab 07/19/17 0500 07/20/17 0205 07/21/17 0425 07/22/17 0522 07/23/17 0619  CALCIUM  --   --  8.4* 8.3* 8.7*  MG 2.1 1.8 1.7  --   --    CBC Recent Labs  Lab 07/21/17 0425 07/22/17 0522 07/23/17 0619  WBC 17.3* 14.0* 12.7*  HGB 10.8* 10.2* 10.5*  HCT 32.4* 31.9* 33.2*  PLT 188 208 232   Coag's No results for input(s): APTT, INR in the last 168 hours. Sepsis Markers Recent Labs  Lab 07/17/17 1121  PROCALCITON 0.20   ABG Recent Labs  Lab 07/20/17 0243  PHART 7.392  PCO2ART 37.1  PO2ART 58.0*   Liver Enzymes Recent Labs  Lab 07/18/17 0716 07/21/17 0425 07/23/17 0619  AST 28 33 24  ALT 17 22 22   ALKPHOS 164* 174* 145*  BILITOT 1.0 1.9* 1.1  ALBUMIN 2.0* 2.6* 2.5*   Cardiac  Enzymes No results for input(s): TROPONINI, PROBNP in the last 168 hours.  Glucose No results for input(s): GLUCAP in the last 168 hours. Imaging No results found. STUDIES:  CXR 1/10 > RUL nodular density. CT chest 1/16 > Large left pleural effusion with compressive atelectasis and overall volume loss. Small right pleural effusion.  Mild right basilar atelectasis. Airspace disease in the posterior segment of the right upper lobe most concerning for pneumonia. Spiculated 1.4 x 0.6 mm right upper lobe pulmonary nodule adjacent, partially contiguous, spiculated pulmonary nodule measuring 9 x 8 mm in the right upper lobe.  CULTURES: None.  ANTIBIOTICS: Cefepime 1/14 Vancomycin 1/14  SIGNIFICANT EVENTS: 1/10 > admitted with left leg pain, likely ischemic from embolic phenomenon.  Taken to OR.  LINES/TUBES: L IJ CVL 1/10 >   DISCUSSION: 72 y.o. female admitted 1/10 with left leg pain.  Felt to have ischemia; therefore, taken to OR for attempts at thrombectomy.  In OR, found to have dead muscle in all LLE compartments. Underwent aputation. Post-operative course complicated by persistent hypoxemia. CT demonstrated consolidation/effusion. For thora 1/17.   ASSESSMENT / PLAN:  Acute Hypoxic Respiratory failure  HCAP  Progressive left-sided airspace disease  L pleural effusion - transudate - s/p  IR thora 1/17 -Cannot exclude aspiration event/hcap -Bedside ultrasound sound review 1/15 demonstrated very little pleural effusion certainly not enough to explain the degree of hypoxia and not enough volume to warrant thoracentesis -CT 1/17 with combination consolidation effusion on L occupying large amount of hemithorax.  -s/p IR thoracentesis 1/17>>> glucose 118, LDH 95, protein <3 Plan Now DNR  Palliative care following, consult reviewed - likely transitioning to more comfort based approach  Continue supplemental O2 to keep sats >88% abx per primary - narrow as able  Pulmonary hygiene   Mobilize as able  Monitor volume status  No further thora unless needed for symptom management then could consider pleur-x   RUL spiculated nodules Plan F/u PET as outpatient if appropriate    Discussed with Dr. Thereasa Solo  PCCM signing off, please call back if needed   Nickolas Madrid, NP 07/23/2017  11:02 AM Pager: [phone] or [phone]

## 2017-07-23 NOTE — Telephone Encounter (Signed)
error 

## 2017-07-23 NOTE — Progress Notes (Addendum)
  Progress Note    07/23/2017 8:10 AM 10 Days Post-Op  Subjective:  Denies pain; says she's hot  Afebrile HR 100's-130's Afib 888'B systolic 16% [place]  Vitals:   07/23/17 0533 07/23/17 0700  BP: 125/86   Pulse:    Resp:    Temp:  98.2 F (36.8 C)  SpO2:  94%    Physical Exam: Incisions:  Bandage in place and dry.   CBC    Component Value Date/Time   WBC 12.7 (H) 07/23/2017 0619   RBC 3.65 (L) 07/23/2017 0619   HGB 10.5 (L) 07/23/2017 0619   HCT 33.2 (L) 07/23/2017 0619   PLT 232 07/23/2017 0619   MCV 91.0 07/23/2017 0619   MCH 28.8 07/23/2017 0619   MCHC 31.6 07/23/2017 0619   RDW 21.0 (H) 07/23/2017 0619   LYMPHSABS 2.2 07/12/2017 1545   MONOABS 0.9 07/12/2017 1545   EOSABS 0.0 07/12/2017 1545   BASOSABS 0.0 07/12/2017 1545    BMET    Component Value Date/Time   NA 144 07/23/2017 0619   K 4.3 07/23/2017 0619   CL 111 07/23/2017 0619   CO2 24 07/23/2017 0619   GLUCOSE 122 (H) 07/23/2017 0619   BUN 14 07/23/2017 0619   CREATININE 0.47 07/23/2017 0619   CALCIUM 8.7 (L) 07/23/2017 0619   GFRNONAA >60 07/23/2017 0619   GFRAA >60 07/23/2017 0619    INR    Component Value Date/Time   INR 1.18 07/13/2017 0434     Intake/Output Summary (Last 24 hours) at 07/23/2017 0810 Last data filed at 07/22/2017 2146 Gross per 24 hour  Intake 20 ml  Output 850 ml  Net -830 ml     Assessment/Plan:  72 y.o. female is s/p left above knee amputation & LLE embolectomy 10 Days Post-Op  -left groin is healing nicely -did not change dressing this morning as pt had urine soaked sheets-talked with RN to let her know.  Will need dressing change, possibly stump sock after sheet change.  Will order. -palliative care consulted to establish goals of care. -pt afebrile and leukocytosis is improving   Leontine Locket, PA-C [facility] [phone] 07/23/2017 8:10 AM   Addendum  I have independently interviewed and examined the patient, and I agree  with the physician assistant's findings.  Will check on L AKA later.    - Review of chart suggestive patient is on a palliative plan of care - Staples can come out in 4 weeks.   - No further surgeries planned  Adele Barthel, MD, FACS [facility] [place] Office: [phone] Pager: [phone]  07/23/2017, 9:58 AM  Addendum  L groin c/d/i, some blood stained Dermabond; L AKA is viable without active bleeding, some drain on mid-segment likely some decompressing hematoma.  - plan as above - Available as needed.  Adele Barthel, MD, FACS [facility] [place] Office: [phone] Pager: [phone]  07/23/2017, 12:47 PM

## 2017-07-23 NOTE — Op Note (Deleted)
OPERATIVE NOTE   PROCEDURE: 1. Evacuation of hematoma Right arm 2. Exploration of right arm 3. Placement of drain  PRE-OPERATIVE DIAGNOSIS: recurrent hematoma right arm  POST-OPERATIVE DIAGNOSIS: same as above   SURGEON: Adele Barthel, MD  ASSISTANT(S): Dagoberto Ligas, PAC   ANESTHESIA: general  ESTIMATED BLOOD LOSS: 50 cc  FINDING(S): 1.  900 cc of hematoma evacuated from right arm 2.  Old hematoma without any obvious active bleeding in right arm 3.  Continued palpable thrill in fistula  SPECIMEN(S):  none   INDICATIONS:   Shelley [name] is a 72 y.o. female who presents with recurrent right arm hematoma after a basilic vein transposition completed by Dr. Donzetta Matters.  I have previously drained a hematoma after that access procedure.  Unfortunately, this hematoma recurred, likely due to full anticoagulation.  Dr. Oneida Alar recommended: evacuation of hematoma right arm.  Risk, benefits, and alternatives to this procedure were discussed.  The patient is aware the risks include but are not limited to: bleeding, infection, nerve damage, thrombosis, need for additional procedures, death and stroke.  The patient agrees to proceed forward with the procedure.   DESCRIPTION: After obtaining full informed written consent, the patient was brought back to the operating room and placed supine upon the operating table.  The patient received IV antibiotics prior to induction.  A procedure time out was completed and the correct surgical site was verified.  After obtaining adequate anesthesia, the patient was prepped and draped in the standard fashion for: right arm exploration.  I  Removed the prior staples in the axillary incision.  I made an incision here and immediately pressurized old blood began draining.  I stuck the suction into the hematoma cavity and evacuated 900 cc of venous appearing blood.  I then made an incision over the hematoma cavity along it longitudinal axis.  I opened the subcutaneous  tissue over the hematoma cavity with electrocautery.    Immediately old hematoma was evident without any active bleeding.  I washed out this hematoma cavity with a Pulsavac and 1 liter of sterile normal saline.  After completing this, there was only a few pockets of adherent thrombus which were removed.  I could see the median antecubtial sensory trunk but no arterial or venous structures were evident or bleeding.  The proximal and distal extent of the fistula were evident in this wound.  There was no bleeding from either end or loose side branches.  I packed the wound with Avitene and held pressure for a few minutes.  I removed the Avitene and then instilled sterile saline in the wound.  There was no bleeding points evident.  I suctioned out the fluid and dried the wound.    I placed a 15 Blake drain through the subcutaneous tissue and skin distally at the antecubitum and placed the drain in the dependent portion of the arm.  I sharply shorten the drain and secured the drain to the skin with a 3-0 Nylon tied to the drain.  I then placed several 3-0 Vicyl stitches in the deep subcutaneous tissue to tack down the subcutaneous tissue to the muscle bed in order to limit the potential space.  I ran a running stitch of 3-0 Vicryl in the subdermal layer.  The skin was reapproximated with a running stitch of 3-0 Nylon.    The drain was placed under bulb suction.  The incision was dressed with sterile dressings.    Will plan on seeing the patient this coming Friday  to check on drain output.  Will hold anticoagulation for 1-2 weeks given the recurrent high volume bleeding.   COMPLICATIONS: none  CONDITION: guarded   Adele Barthel, MD, FACS [facility] Office: [phone] Pager: [phone]  07/23/2017, 9:02 AM

## 2017-07-23 NOTE — Progress Notes (Signed)
Progress Note  Patient Name: Shelley [name] Date of Encounter: 07/23/2017  Primary Cardiologist: Cristopher Peru, MD   Subjective   Feeling OK.  Denies chest pain, palpitations or shortness of breath.  She complains that her "feet are cold".  Inpatient Medications    Scheduled Meds: . chlorhexidine  15 mL Mouth Rinse BID  . Chlorhexidine Gluconate Cloth  6 each Topical Daily  . diltiazem  90 mg Oral Q8H  . feeding supplement (ENSURE ENLIVE)  237 mL Oral TID BM  . levETIRAcetam  500 mg Oral BID  . mouth rinse  15 mL Mouth Rinse q12n4p  . nicotine  14 mg Transdermal Daily  . sodium chloride flush  10-40 mL Intracatheter Q12H  . thiamine  100 mg Oral Daily   Continuous Infusions:  PRN Meds: alum & mag hydroxide-simeth, haloperidol lactate, levalbuterol, LORazepam, metoprolol tartrate, morphine injection, ondansetron, oxyCODONE, polyethylene glycol, RESOURCE THICKENUP CLEAR, sodium chloride flush   Vital Signs    Vitals:   07/22/17 2352 07/23/17 0335 07/23/17 0533 07/23/17 0700  BP: 109/75  125/86   Pulse: (!) 105     Resp: (!) 26     Temp: (!) 97.3 F (36.3 C) (!) 97.5 F (36.4 C)  98.2 F (36.8 C)  TempSrc: Oral Oral  Oral  SpO2: 100%   94%  Weight:      Height:        Intake/Output Summary (Last 24 hours) at 07/23/2017 1031 Last data filed at 07/22/2017 2146 Gross per 24 hour  Intake 10 ml  Output 850 ml  Net -840 ml   Filed Weights   07/13/17 1741 07/14/17 0545 07/22/17 0447  Weight: 152 lb 5.4 oz (69.1 kg) 148 lb 5.9 oz (67.3 kg) 153 lb 10.6 oz (69.7 kg)    Telemetry    Atrial fibrillation.  Rate 100s-110s - Personally Reviewed  ECG    N/a  - Personally Reviewed  Physical Exam   VS:  BP 125/86   Pulse (!) 105   Temp 98.2 F (36.8 C) (Oral)   Resp (!) 26   Ht 5\' 5"  (1.651 m)   Wt 153 lb 10.6 oz (69.7 kg)   SpO2 94%   BMI 25.57 kg/m  , BMI Body mass index is 25.57 kg/m. GENERAL:  Chronically ill-appearing HEENT: Pupils equal round and  reactive, fundi not visualized, oral mucosa unremarkable NECK:  No jugular venous distention, waveform within normal limits, carotid upstroke brisk and symmetric, no bruits LUNGS:  Clear to auscultation bilaterally HEART:  Tachycardic.  Irregularly irregular.   PMI not displaced or sustained,S1 and S2 within normal limits, no S3, no S4, no clicks, no rubs, no murmurs ABD:  Flat, positive bowel sounds normal in frequency in pitch, no bruits, no rebound, no guarding, no midline pulsatile mass, no hepatomegaly, no splenomegaly EXT:  L AKA.  no edema, no cyanosis no clubbing SKIN:  No rashes no nodules NEURO:  Cranial nerves II through XII grossly intact, motor grossly intact throughout [facility]:   Oriented to person but not place or time   Labs    Chemistry Recent Labs  Lab 07/18/17 0716  07/21/17 0425 07/22/17 0522 07/23/17 0619  NA 140   < > 138 143 144  K 3.6   < > 3.7 4.0 4.3  CL 107   < > 100* 106 111  CO2 22   < > 24 26 24   GLUCOSE 120*   < > 114* 111* 122*  BUN 13   < >  11 20 14   CREATININE 0.55   < > 0.60 0.52 0.47  CALCIUM 7.8*   < > 8.4* 8.3* 8.7*  PROT 4.3*  --  5.4*  --  5.1*  ALBUMIN 2.0*  --  2.6*  --  2.5*  AST 28  --  33  --  24  ALT 17  --  22  --  22  ALKPHOS 164*  --  174*  --  145*  BILITOT 1.0  --  1.9*  --  1.1  GFRNONAA >60   < > >60 >60 >60  GFRAA >60   < > >60 >60 >60  ANIONGAP 11   < > 14 11 9    < > = values in this interval not displayed.     Hematology Recent Labs  Lab 07/21/17 0425 07/22/17 0522 07/23/17 0619  WBC 17.3* 14.0* 12.7*  RBC 3.70*  3.70* 3.54* 3.65*  HGB 10.8* 10.2* 10.5*  HCT 32.4* 31.9* 33.2*  MCV 87.6 90.1 91.0  MCH 29.2 28.8 28.8  MCHC 33.3 32.0 31.6  RDW 20.7* 20.7* 21.0*  PLT 188 208 232    Cardiac EnzymesNo results for input(s): TROPONINI in the last 168 hours. No results for input(s): TROPIPOC in the last 168 hours.   BNPNo results for input(s): BNP, PROBNP in the last 168 hours.   DDimer No results for input(s):  DDIMER in the last 168 hours.   Radiology    Dg Chest Port 1 View  Result Date: 07/22/2017 CLINICAL DATA:  72 year old female with shortness of breath. EXAM: PORTABLE CHEST 1 VIEW COMPARISON:  Chest radiograph dated 07/20/2017 FINDINGS: An area of increased opacity in the left mid to lower lung field similar to prior radiograph most consistent with pneumonia. Faint density in the right upper lung also similar to prior radiograph. Bilateral small pleural effusions similar to prior radiograph. There is no pneumothorax stable cardiac silhouette. Atherosclerotic calcification of the aortic arch no acute osseous pathology. Vascular calcification in the upper abdomen noted. IMPRESSION: Overall no significant interval change in the aeration of the lungs, and pulmonary opacities compared to prior radiograph. Bilateral pleural effusions similar to prior radiograph. Clinical correlation and follow-up recommended. Electronically Signed   By: Anner Crete M.D.   On: 07/22/2017 06:22    Cardiac Studies   Echo 07/13/17: Study Conclusions  - Left ventricle: The cavity size was normal. Systolic function was   vigorous. The estimated ejection fraction was in the range of 65%   to 70%. Wall motion was normal; there were no regional wall   motion abnormalities. The study was not technically sufficient to   allow evaluation of LV diastolic dysfunction due to atrial   fibrillation. - Aortic valve: There was trivial regurgitation. - Aorta: Aortic root dimension: 40 mm (ED). - Aortic root: The aortic root was mildly dilated. - Mitral valve: There was trivial regurgitation. - Tricuspid valve: There was mild-moderate regurgitation. - Pulmonic valve: There was trivial regurgitation. - Pulmonary arteries: PA peak pressure: 32 mm Hg (S).   Patient Profile     72 y.o. female with prior stroke, hypertension, chronic diastolic heart failure, vascular dementia, and paroxysmal atrial fibrillation here with L foot  pain now s/p L thrombolectomy and AKA.   Assessment & Plan    # Persistant atrial fibrillation: Heparin stopped 2/2 worsening anemia.  Rate poorly-controlled.  She has taken all doses of diltiazem in the last several days.  We will increase the dose to 120mg  q8h.   #  Hypertension: BP well-controlled.  Increase diltiazem as above.  # Chronic diastolic heart failure: Patient is euvolemic.   For questions or updates, please contact [place] Please consult [email or web address] for contact info under Cardiology/STEMI.      Signed, Skeet Latch, MD  07/23/2017, 10:31 AM

## 2017-07-23 NOTE — Progress Notes (Signed)
Daily Progress Note   Patient Name: Shelley [name]       Date: 07/23/2017 DOB: December 28, 1945  Age: 72 y.o. MRN#: [id number] Attending Physician: Cherene Altes, MD Primary Care Physician: [name], MD Admit Date: 07/12/2017  Reason for Consultation/Follow-up: Establishing goals of care  Subjective: Complains of being sleepy and headache. Frustrated with being in the hospital and not at home.   Length of Stay: 11  Current Medications: Scheduled Meds:  . chlorhexidine  15 mL Mouth Rinse BID  . Chlorhexidine Gluconate Cloth  6 each Topical Daily  . diltiazem  120 mg Oral Q8H  . feeding supplement (ENSURE ENLIVE)  237 mL Oral TID BM  . levETIRAcetam  500 mg Oral BID  . mouth rinse  15 mL Mouth Rinse q12n4p  . sodium chloride flush  10-40 mL Intracatheter Q12H  . thiamine  100 mg Oral Daily    Continuous Infusions:   PRN Meds: acetaminophen **OR** acetaminophen, alum & mag hydroxide-simeth, haloperidol lactate, levalbuterol, LORazepam, metoprolol tartrate, morphine injection, ondansetron, oxyCODONE, polyethylene glycol, RESOURCE THICKENUP CLEAR, sodium chloride flush  Physical Exam  Constitutional: She appears well-developed.  HENT:  Head: Normocephalic and atraumatic.  Cardiovascular: Tachycardia present.  Pulmonary/Chest: No accessory muscle usage. No tachypnea. No respiratory distress. She has decreased breath sounds. She has rhonchi in the right lower field and the left lower field.  12L nasal cannula  Abdominal: Soft. Normal appearance.  Neurological: She is alert.  Oriented to person and place and somewhat to situation  Nursing note and vitals reviewed.           Vital Signs: BP 125/86   Pulse (!) 105   Temp 98.2 F (36.8 C) (Oral)   Resp (!) 26   Ht '5\' 5"'  (1.651  m)   Wt 69.7 kg (153 lb 10.6 oz)   SpO2 94%   BMI 25.57 kg/m  SpO2: SpO2: 94 % O2 Device: O2 Device: Nasal Cannula O2 Flow Rate: O2 Flow Rate (L/min): 13 L/min  Intake/output summary:   Intake/Output Summary (Last 24 hours) at 07/23/2017 1058 Last data filed at 07/22/2017 2146 Gross per 24 hour  Intake 10 ml  Output 850 ml  Net -840 ml   LBM: Last BM Date: 07/22/17 Baseline Weight: Weight: 69.1 kg (152 lb 5.4 oz) Most recent weight: Weight:  69.7 kg (153 lb 10.6 oz)       Palliative Assessment/Data: 30%   Flowsheet Rows     Most Recent Value  Intake Tab  Referral Department  Hospitalist  Unit at Time of Referral  Intermediate Care Unit  Palliative Care Primary Diagnosis  Cardiac  Date Notified  07/21/17  Palliative Care Type  New Palliative care  Reason for referral  Clarify Goals of Care  Date of Admission  07/12/17  Date first seen by Palliative Care  07/22/17  # of days Palliative referral response time  1 Day(s)  # of days IP prior to Palliative referral  9  Clinical Assessment  Palliative Performance Scale Score  30%  Pain Max last 24 hours  Not able to report  Pain Min Last 24 hours  Not able to report  Dyspnea Max Last 24 Hours  Not able to report  Dyspnea Min Last 24 hours  Not able to report  Nausea Max Last 24 Hours  Not able to report  Nausea Min Last 24 Hours  Not able to report  Anxiety Max Last 24 Hours  Not able to report  Anxiety Min Last 24 Hours  Not able to report  Other Max Last 24 Hours  Not able to report  Psychosocial & Spiritual Assessment  Palliative Care Outcomes  Patient/Family meeting held?  No  Palliative Care follow-up planned  Yes, Facility      Patient Active Problem List   Diagnosis Date Noted  . Goals of care, counseling/discussion   . Palliative care by specialist   . Persistent atrial fibrillation ([place])   . Acute respiratory failure with hypoxia ([place])   . Malnutrition of moderate degree 07/17/2017  . Hypoxia   .  Atelectasis   . Pleural effusion   . Tobacco abuse   . Lung nodule   . Thromboembolism ([place]) 07/12/2017  . 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 Adjustment disorder with mixed anxiety and depressed mood   . Left pontine cerebrovascular accident ([place]) 06/13/2017  . Benign essential HTN   . Diastolic dysfunction   . Hypokalemia   . Stroke (cerebrum) ([place]) 06/10/2017  . Acute lacunar stroke ([place]) 06/08/2017  . Diplopia 06/08/2017  . Hypertension   . Anxiety   . Seizures ([place])   . Encounter for screening colonoscopy 05/14/2012    Palliative Care Assessment & Plan   HPI: 71 y.o. female  with past medical history of hypertension, seizure disorder, recent CVA Dec 3845, diastolic heart failure grade 2, osteoporosis, alcohol use disorder, vascular dementia, thrombocytopenia, lung nodule, atrial fib admitted on 07/12/2017 with leg pain.  Patient was taken to the OR emergently with extensive thrombectomy.  On 07/13/2017 she underwent a left AKA, femoral thrombectomy.  On 07/19/2017 secondary to worsening dyspnea, underwent left thoracentesis for 550 cc. Hospitalization complicated by ongoing severe aspiration and respiratory decline along with very poor intake.  Patient is refusing some aspects of treatment. Palliative care consult for goals of care.   Assessment: I met this morning with Shelley [name] who complains of being sleepy and headache. She tells me that she would sleep better in her own bed. Tells me that she is at "[facility]" and that she wants to go home. I asked her if she knew what going home would mean. She responds by saying "I'm ready to be with my mom and dad." I reiterated that she is very sick and going home would be just keeping her  comfortable for whatever time she has left to live. She nods her head and says "yes that's what I want." I will call and speak with her husband about these wishes and RN to give med for headache. Emotional support  provided to Shelley [name].   Late entry: I returned back to Shelley [name]'s room and she is up in the chair but asleep. Her husband is at bedside. I had a long discussion about Shelley [name]'s current status and explained poor prognosis. Mr. Cristo explains that he has just been waiting for her to get better. When he thought back further he shares that she has had some functional and cognitive decline even prior to the stroke. He is tearful as he says that he sees she has been declining each day. I also shared with him what she told me this morning. He tearfully tells me that if she wants to go and be with her mom and dad then that is what he wants for her. He quietly holds her hand and I explained that we would talk more tomorrow.   I do not believe that he can care for her at home. He was struggling to recognize her decline and what is reversible and not reversible. I want to allow him some time to process her poor prognosis. We will speak more tomorrow.    Recommendations/Plan:  Pain: Continue with morphine IV 1-2 mg every 2 hours as needed; oxycodone 5-10 mg orally every 6 hours as needed.  Added prn Tylenol for headache.   Agitation: He with Ativan 1-2 mg IV every 4 hours as needed, Haldol 2-5 mg IV every 6 hours as needed  Dyspnea: Continue with targeted pulmonary treatments such as oxygen, nebulizer treatments but also utilization of opioids for severe dyspnea  Code Status:  DNR  Prognosis:   Prognosis grim with continued respiratory decline combined with severe aspiration risk and poor intake. < 2 weeks very likely and probably eligible for hospice facility but she desires to go home. Will speak with family regarding home with hospice option.   Discharge Planning:  To Be Determined  Care plan was discussed with Dr. Thereasa Solo  Thank you for allowing the Palliative Medicine Team to assist in the care of this patient.   Total Time 60 min Prolonged Time Billed  no       Greater than 50%   of this time was spent counseling and coordinating care related to the above assessment and plan.  Vinie Sill, NP Palliative Medicine Team Pager # [phone] (M-F 8a-5p) Team Phone # [phone] (Nights/Weekends)

## 2017-07-23 NOTE — Care Management Note (Addendum)
Case Management Note  Patient Details  Name: Shelley [name] MRN: [id number] Date of Birth: [birth year]  Subjective/Objective:     Pt admitted with thromboembolism requiring thrombectomy, s/p amputation , Alert and conversant, but confused.  She has left effusion, dysphagia, L AKA, persistent afib, anemia , AMS, chronic diastolic CHF, hypotension, rul nofular densities, hx cva, thrombocytopenia, ETOH abuse, moderate malnutrition.   1/25 [place], BSN- Palliative following patient, son is to tale with step father and patient's brother to help make decision regarding Hospice. Palliative to talk to family regarding home with hospice.                                   Action/Plan: PTA from home - SNF recommended for discharge      Expected Discharge Date:                  Expected Discharge Plan:  [place]  In-House Referral:  Clinical Social Work  Discharge planning Services  CM Consult  Post Acute Care Choice:    Choice offered to:     DME Arranged:    DME Agency:     HH Arranged:    [place] Agency:     Status of Service:     If discussed at [employer], dates discussed:    Additional Comments:  Zenon Mayo, RN 07/23/2017, 5:07 PM

## 2017-07-24 LAB — CULTURE, BODY FLUID-BOTTLE: CULTURE: NO GROWTH

## 2017-07-24 MED ORDER — DIGOXIN 0.25 MG/ML IJ SOLN
0.1250 mg | Freq: Three times a day (TID) | INTRAMUSCULAR | Status: AC
  Administered 2017-07-24 (×2): 0.125 mg via INTRAVENOUS
  Filled 2017-07-24 (×2): qty 2

## 2017-07-24 MED ORDER — DILTIAZEM HCL ER COATED BEADS 300 MG PO CP24
300.0000 mg | ORAL_CAPSULE | Freq: Every day | ORAL | Status: DC
  Administered 2017-07-25 – 2017-07-31 (×6): 300 mg via ORAL
  Filled 2017-07-24 (×7): qty 1

## 2017-07-24 MED ORDER — ENSURE ENLIVE PO LIQD: 237.0000 mL | Freq: Three times a day (TID) | ORAL | Status: DC | PRN

## 2017-07-24 MED ORDER — DIGOXIN 125 MCG PO TABS
0.2500 mg | ORAL_TABLET | Freq: Every day | ORAL | Status: DC
  Administered 2017-07-25 – 2017-07-26 (×2): 0.25 mg via ORAL
  Filled 2017-07-24 (×2): qty 2

## 2017-07-24 MED ORDER — DIGOXIN 0.25 MG/ML IJ SOLN
0.3750 mg | Freq: Once | INTRAMUSCULAR | Status: AC
  Administered 2017-07-24: 0.375 mg via INTRAVENOUS
  Filled 2017-07-24: qty 2

## 2017-07-24 NOTE — Progress Notes (Signed)
Progress Note  Patient Name: Shelley [name] Date of Encounter: 07/24/2017  Primary Cardiologist: Cristopher Peru, MD   Subjective   Denies chest pain or shortness of breath.  Wants to go home and lay in her bed.  Inpatient Medications    Scheduled Meds: . chlorhexidine  15 mL Mouth Rinse BID  . Chlorhexidine Gluconate Cloth  6 each Topical Daily  . diltiazem  120 mg Oral Q8H  . feeding supplement (ENSURE ENLIVE)  237 mL Oral TID BM  . levETIRAcetam  500 mg Oral BID  . mouth rinse  15 mL Mouth Rinse q12n4p  . sodium chloride flush  10-40 mL Intracatheter Q12H  . thiamine  100 mg Oral Daily   Continuous Infusions:  PRN Meds: acetaminophen **OR** acetaminophen, alum & mag hydroxide-simeth, haloperidol lactate, levalbuterol, LORazepam, metoprolol tartrate, morphine injection, ondansetron, oxyCODONE, polyethylene glycol, RESOURCE THICKENUP CLEAR, sodium chloride flush   Vital Signs    Vitals:   07/24/17 0300 07/24/17 0400 07/24/17 0612 07/24/17 0728  BP: (!) 130/58 120/72 (!) 152/91 105/68  Pulse: (!) 118 (!) 113  93  Resp: (!) 30 (!) 22  (!) 21  Temp: 98.3 F (36.8 C)   (!) 97.3 F (36.3 C)  TempSrc: Oral   Oral  SpO2: 100% 96%  92%  Weight:      Height:        Intake/Output Summary (Last 24 hours) at 07/24/2017 0826 Last data filed at 07/23/2017 1300 Gross per 24 hour  Intake -  Output 1200 ml  Net -1200 ml   Filed Weights   07/13/17 1741 07/14/17 0545 07/22/17 0447  Weight: 152 lb 5.4 oz (69.1 kg) 148 lb 5.9 oz (67.3 kg) 153 lb 10.6 oz (69.7 kg)    Telemetry    Atrial fibrillation.  Rate 100s-110s - Personally Reviewed  ECG    N/a  - Personally Reviewed  Physical Exam   VS:  BP 105/68   Pulse 93   Temp (!) 97.3 F (36.3 C) (Oral)   Resp (!) 21   Ht 5\' 5"  (1.651 m)   Wt 153 lb 10.6 oz (69.7 kg)   SpO2 92%   BMI 25.57 kg/m  , BMI Body mass index is 25.57 kg/m. GENERAL:  Chronically ill-appearing HEENT: Pupils equal round and reactive, fundi not  visualized, oral mucosa unremarkable NECK:  No jugular venous distention, waveform within normal limits, carotid upstroke brisk and symmetric, no bruits LUNGS: Diminished at left base.  No crackles, wheezes, or rhonchi. HEART:  Tachycardic.  Irregularly irregular.   PMI not displaced or sustained,S1 and S2 within normal limits, no S3, no S4, no clicks, no rubs, no murmurs ABD:  Flat, positive bowel sounds normal in frequency in pitch, no bruits, no rebound, no guarding, no midline pulsatile mass, no hepatomegaly, no splenomegaly EXT:  L AKA.  no edema, no cyanosis no clubbing SKIN:  No rashes no nodules NEURO:  Cranial nerves II through XII grossly intact, motor grossly intact throughout [facility]:   Oriented to person but not place or time   Labs    Chemistry Recent Labs  Lab 07/18/17 0716  07/21/17 0425 07/22/17 0522 07/23/17 0619  NA 140   < > 138 143 144  K 3.6   < > 3.7 4.0 4.3  CL 107   < > 100* 106 111  CO2 22   < > 24 26 24   GLUCOSE 120*   < > 114* 111* 122*  BUN 13   < >  11 20 14   CREATININE 0.55   < > 0.60 0.52 0.47  CALCIUM 7.8*   < > 8.4* 8.3* 8.7*  PROT 4.3*  --  5.4*  --  5.1*  ALBUMIN 2.0*  --  2.6*  --  2.5*  AST 28  --  33  --  24  ALT 17  --  22  --  22  ALKPHOS 164*  --  174*  --  145*  BILITOT 1.0  --  1.9*  --  1.1  GFRNONAA >60   < > >60 >60 >60  GFRAA >60   < > >60 >60 >60  ANIONGAP 11   < > 14 11 9    < > = values in this interval not displayed.     Hematology Recent Labs  Lab 07/21/17 0425 07/22/17 0522 07/23/17 0619  WBC 17.3* 14.0* 12.7*  RBC 3.70*  3.70* 3.54* 3.65*  HGB 10.8* 10.2* 10.5*  HCT 32.4* 31.9* 33.2*  MCV 87.6 90.1 91.0  MCH 29.2 28.8 28.8  MCHC 33.3 32.0 31.6  RDW 20.7* 20.7* 21.0*  PLT 188 208 232    Cardiac EnzymesNo results for input(s): TROPONINI in the last 168 hours. No results for input(s): TROPIPOC in the last 168 hours.   BNPNo results for input(s): BNP, PROBNP in the last 168 hours.   DDimer No results for  input(s): DDIMER in the last 168 hours.   Radiology    No results found.  Cardiac Studies   Echo 07/13/17: Study Conclusions  - Left ventricle: The cavity size was normal. Systolic function was   vigorous. The estimated ejection fraction was in the range of 65%   to 70%. Wall motion was normal; there were no regional wall   motion abnormalities. The study was not technically sufficient to   allow evaluation of LV diastolic dysfunction due to atrial   fibrillation. - Aortic valve: There was trivial regurgitation. - Aorta: Aortic root dimension: 40 mm (ED). - Aortic root: The aortic root was mildly dilated. - Mitral valve: There was trivial regurgitation. - Tricuspid valve: There was mild-moderate regurgitation. - Pulmonic valve: There was trivial regurgitation. - Pulmonary arteries: PA peak pressure: 32 mm Hg (S).   Patient Profile     72 y.o. female with prior stroke, hypertension, chronic diastolic heart failure, vascular dementia, and paroxysmal atrial fibrillation here with L foot pain now s/p L thrombolectomy and AKA.   Assessment & Plan    # Persistant atrial fibrillation: Heparin stopped 2/2 worsening anemia.  Rate remains poorly-controlled despite increasing diltiazem.  She has taken all doses of diltiazem in the last several days.  We will switch diltiazem to 300 mg daily starting tomorrow.  Add digoxin IV load today with plans to switch to oral tomorrow.  She will need a level checked in 3 days.  # Hypertension: BP well-controlled.  Consolidate diltiazem as above.  # Chronic diastolic heart failure: Stable.  Blood pressure control as above.   For questions or updates, please contact [place] Please consult [email or web address] for contact info under Cardiology/STEMI.      Signed, Skeet Latch, MD  07/24/2017, 8:26 AM

## 2017-07-24 NOTE — Progress Notes (Signed)
OT Cancellation Note  Patient Details Name: Shelley [name] MRN: [id number] DOB: [birth year]   Cancelled Treatment:    Reason Eval/Treat Not Completed: Other (comment). Pt with decline in function. Meeting with palliative. Per note, grim prognosis. Will hold at this time and discuss with palliative regarding role of therapy.   [place], OT/L  [phone] 07/24/2017 07/24/2017, 4:06 PM

## 2017-07-24 NOTE — Progress Notes (Addendum)
Daily Progress Note   Patient Name: Shelley [name]       Date: 07/24/2017 DOB: 02/14/1946  Age: 72 y.o. MRN#: [id number] Attending Physician: Cherene Altes, MD Primary Care Physician: [name], MD Admit Date: 07/12/2017  Reason for Consultation/Follow-up: Establishing goals of care  Subjective: Shelley [name] is more lethargic today. Mumbles at times but unable to understand her.   Length of Stay: 12  Current Medications: Scheduled Meds:  . chlorhexidine  15 mL Mouth Rinse BID  . Chlorhexidine Gluconate Cloth  6 each Topical Daily  . digoxin  0.125 mg Intravenous Q8H  . [START ON 07/25/2017] digoxin  0.25 mg Oral Daily  . [START ON 07/25/2017] diltiazem  300 mg Oral Daily  . diltiazem  120 mg Oral Q8H  . feeding supplement (ENSURE ENLIVE)  237 mL Oral TID BM  . levETIRAcetam  500 mg Oral BID  . mouth rinse  15 mL Mouth Rinse q12n4p  . sodium chloride flush  10-40 mL Intracatheter Q12H  . thiamine  100 mg Oral Daily    Continuous Infusions:   PRN Meds: acetaminophen **OR** acetaminophen, alum & mag hydroxide-simeth, haloperidol lactate, levalbuterol, LORazepam, metoprolol tartrate, morphine injection, ondansetron, oxyCODONE, polyethylene glycol, RESOURCE THICKENUP CLEAR, sodium chloride flush  Physical Exam  Constitutional: She appears well-developed.  HENT:  Head: Normocephalic and atraumatic.  Cardiovascular: Tachycardia present.  Pulmonary/Chest: No accessory muscle usage. No tachypnea. No respiratory distress. She has decreased breath sounds. She has rhonchi in the right lower field and the left lower field.  15L nasal cannula  Abdominal: Soft. Normal appearance.  Neurological:  More lethargic today  Nursing note and vitals reviewed.           Vital Signs: BP 96/80    Pulse (!) 110   Temp (!) 97.3 F (36.3 C) (Oral)   Resp (!) 25   Ht 5\' 5"  (1.651 m)   Wt 69.7 kg (153 lb 10.6 oz)   SpO2 95%   BMI 25.57 kg/m  SpO2: SpO2: 95 % O2 Device: O2 Device: High Flow Nasal Cannula O2 Flow Rate: O2 Flow Rate (L/min): 15 L/min  Intake/output summary:  No intake or output data in the 24 hours ending 07/24/17 1400 LBM: Last BM Date: 07/23/17 Baseline Weight: Weight: 69.1 kg (152 lb 5.4 oz) Most recent weight:  Weight: 69.7 kg (153 lb 10.6 oz)       Palliative Assessment/Data: 30%   Flowsheet Rows     Most Recent Value  Intake Tab  Referral Department  Hospitalist  Unit at Time of Referral  Intermediate Care Unit  Palliative Care Primary Diagnosis  Cardiac  Date Notified  07/21/17  Palliative Care Type  New Palliative care  Reason for referral  Clarify Goals of Care  Date of Admission  07/12/17  Date first seen by Palliative Care  07/22/17  # of days Palliative referral response time  1 Day(s)  # of days IP prior to Palliative referral  9  Clinical Assessment  Palliative Performance Scale Score  30%  Pain Max last 24 hours  Not able to report  Pain Min Last 24 hours  Not able to report  Dyspnea Max Last 24 Hours  Not able to report  Dyspnea Min Last 24 hours  Not able to report  Nausea Max Last 24 Hours  Not able to report  Nausea Min Last 24 Hours  Not able to report  Anxiety Max Last 24 Hours  Not able to report  Anxiety Min Last 24 Hours  Not able to report  Other Max Last 24 Hours  Not able to report  Psychosocial & Spiritual Assessment  Palliative Care Outcomes  Patient/Family meeting held?  No  Palliative Care follow-up planned  Yes, Facility      Patient Active Problem List   Diagnosis Date Noted  . ESRD (end stage renal disease) on dialysis ([place])   . Dysphagia   . Goals of care, counseling/discussion   . Palliative care by specialist   . Persistent atrial fibrillation ([place])   . Acute respiratory failure with hypoxia ([place])     . Malnutrition of moderate degree 07/17/2017  . Hypoxia   . Atelectasis   . Pleural effusion   . Tobacco abuse   . Lung nodule   . Thromboembolism ([place]) 07/12/2017  . 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 Adjustment disorder with mixed anxiety and depressed mood   . Left pontine cerebrovascular accident ([place]) 06/13/2017  . Benign essential HTN   . Diastolic dysfunction   . Hypokalemia   . Stroke (cerebrum) ([place]) 06/10/2017  . Acute lacunar stroke ([place]) 06/08/2017  . Diplopia 06/08/2017  . Hypertension   . Anxiety   . Seizures ([place])   . Encounter for screening colonoscopy 05/14/2012    Palliative Care Assessment & Plan   HPI: 72 y.o. female  with past medical history of hypertension, seizure disorder, recent CVA Dec 8295, diastolic heart failure grade 2, osteoporosis, alcohol use disorder, vascular dementia, thrombocytopenia, lung nodule, atrial fib admitted on 07/12/2017 with leg pain.  Patient was taken to the OR emergently with extensive thrombectomy.  On 07/13/2017 she underwent a left AKA, femoral thrombectomy.  On 07/19/2017 secondary to worsening dyspnea, underwent left thoracentesis for 550 cc. Hospitalization complicated by ongoing severe aspiration and respiratory decline along with very poor intake.  Patient is refusing some aspects of treatment. Palliative care consult for goals of care.   Assessment: Shelley [name] continues to be more lethargic, less interactive, and continues not to eat but a couple bites and maybe half an Ensure a day. She is able so far to take her pills but unsure how long she will be able to continue this. Her husband is at bedside and tells me that she seems worse today. I explained that I agree  and this is what we have been worried about. We spent some time discussing Shelley [name]'s values and how she refused to even go to see a doctor and how she would probably hate everything we have put her through. Especially since  what we have done has not made her any better. We further discussed options of focusing more on comfort care and even hospice. Mr. Innes is tearful and tells me that he would prefer for son, Gerald Stabs to make those decisions as he doesn't feel like he is able. Emotional support provided.   I called and spoke with Gerald Stabs and we plan to talk further over the phone tomorrow as he is currently at work.   Today is Shelley [name]'s birthday.   Recommendations/Plan:  Pain: Continue with morphine IV 1-2 mg every 2 hours as needed; oxycodone 5-10 mg orally every 6 hours as needed.  Added prn Tylenol for headache.   Agitation: He with Ativan 1-2 mg IV every 4 hours as needed, Haldol 2-5 mg IV every 6 hours as needed  Dyspnea: Continue with targeted pulmonary treatments such as oxygen, nebulizer treatments but also utilization of opioids for severe dyspnea  Code Status:  DNR  Prognosis:   Prognosis grim with continued respiratory decline combined with severe aspiration risk and poor intake. < 2 weeks very likely and probably eligible for hospice facility but she desires to go home. Will speak with family regarding home with hospice option.   Discharge Planning:  To Be Determined   Thank you for allowing the Palliative Medicine Team to assist in the care of this patient.   Total Time 40 min Prolonged Time Billed  no       Greater than 50%  of this time was spent counseling and coordinating care related to the above assessment and plan.  Vinie Sill, NP Palliative Medicine Team Pager # [phone] (M-F 8a-5p) Team Phone # [phone] (Nights/Weekends)

## 2017-07-24 NOTE — Progress Notes (Signed)
[place] TEAM 1 - Stepdown/ICU TEAM  Shelley [name]  DEY:[id number] DOB: December 06, 1945 DOA: 07/12/2017 PCP: [name], MD    Brief Narrative:  72yo F w/ a Hx of Alcohol abuse, Anxiety, CVA, Seizures, HTN, dementia, and Osteoporosis who presented w/ left foot pain.    Significant Events: 1/10 admit w/ L leg pain - taken to OR emergently w/ extensive thrombectomy - L IJ CVL 1/11 L AKA and L common femoral thrombectomy  1/17 L thoracentesis in IR > 522ml - transudate   Subjective: Pt has been more lethargic today, and much less interactive.  When she does awaken she says "leave me alone" repeatedly.  She confirms to me she is "ready to die."  I have spoken to her husband at the bedside, who acknowledges that Shelley [name] is dying and should be kept comfortable.     Assessment & Plan:  Goals of Care Pt appears to be actively dying at this time - I feel residential hospice would be in her best interest, unless a large amount of care could be arranged at her home - PC is working with the family to assist with these decisions - for now will concentrate on HR control (aids in comfort) and avoid any other interventions not directly related to comfort   L Effusion - Aspiration pneumonitis/pneumonia - acute hypoxic respiratory failure- recurring aspiration Progressively worsening - hx most c/w recurring aspiration - abx were not proving helpful therefore currently off abx - this appears to be an untreatable issue - Palliative Care consulted to assist in establishing goals of care   Dysphagia - recurring aspiration Pt has refused MBS or FEES - appears to be aspirating all consistencies tried at bedside - there is no "safe" diet to allow her at this time, but she will not tolerate/allow an NG feeding tube - I spoke w/ her husband 10/18 and explained our predicament, and we agreed that allowing her to eat while accepting the risk of aspiration was the best course of action - her dementia appears to have  progressed to the early end-stages such that her ability to safely maintain nourishment is markedly impaired - this has been discussed w/ her as well, and she tells me she "just wants to be left alone"  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 as per VVS - appears to be stable from this standpoint   Persistent atrial fibrillation HR remains suboptimally controlled - pt often refusing meds - heparin gtt has been stopped due to acute worsening of anemia (>2g Hgb drop) - Cards adjusting meds further   Anemia of unclear etiology  No signs of overt bleeding - LDH and Haptoglobin not c/w hemolysis - responded better than expected to 2U PRBC - CT abd w/o evidence of RPH - Hgb stable at this time - no further w/u planned at this time   Recent Labs  Lab 07/19/17 0923 07/20/17 0205 07/21/17 0425 07/22/17 0522 07/23/17 0619  HGB 6.4* 9.9* 10.8* 10.2* 10.5*    Altered mental status - vascular dementia - early end-stage dementia  patient is uncooperative - she is alert and answers orientation questions correctly, but is confused when questioned in more detail - I suspect a baseline cognitive impairment - CT head revealed no CVA but has confirmed findings c/w severe vascular mediated dementia   Chronic grade 2 diastolic CHF (per TTE Dec 2018) Appears euvolemic at present - poor intake off IVF thus far therefore volume overload has not been a problem  Filed Weights   07/13/17 1741 07/14/17 0545 07/22/17 0447  Weight: 69.1 kg (152 lb 5.4 oz) 67.3 kg (148 lb 5.9 oz) 69.7 kg (153 lb 10.6 oz)    Hypotension  Resolved w/ simple volume expansion   RUL nodular densities CT angio noted spiculated mass 2 - PET recommended as outpt - unclear if cytology was sent on thora fluid - given progressing dementia and issues discussed above further evaluation would not be appropriate or helpful   Tobacco dependence  Hx CVA   Thrombocytopenia (baseline~180) Likely due to EtOH abuse   EtOH abuse Now beyond the  point at which ongoing withdrawal would be expected   Moderate malnutrition in context of acute illness/injury  DVT prophylaxis: SCDs Code Status: FULL CODE Family Communication: spoke w/ husband at bedside  Disposition Plan: to minimize discomfort to pt will keep in current bed until a disposition can be established   Consultants:  [facility] PCCM Cardiology Palliative Care   Antimicrobials:  Cefepime 1/14 > 1/18 Vancomycin 1/14 > 1/18   Objective: Blood pressure 122/81, pulse 100, temperature 98.1 F (36.7 C), temperature source Axillary, resp. rate (!) 24, height 5\' 5"  (1.651 m), weight 69.7 kg (153 lb 10.6 oz), SpO2 98 %. No intake or output data in the 24 hours ending 07/24/17 1635 Filed Weights   07/13/17 1741 07/14/17 0545 07/22/17 0447  Weight: 69.1 kg (152 lb 5.4 oz) 67.3 kg (148 lb 5.9 oz) 69.7 kg (153 lb 10.6 oz)    Examination: General: still requiring high level O2 support - lethargic  Lungs: poor air movement th/o - shallow resp effort  Cardiovascular: irreg irreg - rate ~100  Abdomen: NT/ND, soft, bs hypoactive  Extremities: L AKA site dressed/dry  CBC: Recent Labs  Lab 07/19/17 0500 07/19/17 0923 07/20/17 0205 07/21/17 0425 07/22/17 0522 07/23/17 0619  WBC 12.5*  --  17.7* 17.3* 14.0* 12.7*  HGB 5.9* 6.4* 9.9* 10.8* 10.2* 10.5*  HCT 18.2* 19.6* 30.2* 32.4* 31.9* 33.2*  MCV 92.9  --  86.0 87.6 90.1 91.0  PLT 162  --  100* 188 208 431   Basic Metabolic Panel: Recent Labs  Lab 07/18/17 0716 07/18/17 1611 07/19/17 0500 07/20/17 0205 07/21/17 0425 07/22/17 0522 07/23/17 0619  NA 140 143  --   --  138 143 144  K 3.6 3.9  --   --  3.7 4.0 4.3  CL 107 110  --   --  100* 106 111  CO2 22 26  --   --  24 26 24   GLUCOSE 120* 145*  --   --  114* 111* 122*  BUN 13 12  --   --  11 20 14   CREATININE 0.55 0.58  --   --  0.60 0.52 0.47  CALCIUM 7.8* 7.5*  --   --  8.4* 8.3* 8.7*  MG 1.6*  --  2.1 1.8 1.7  --   --    GFR: Estimated Creatinine Clearance:  61.4 mL/min (by C-G formula based on SCr of 0.47 mg/dL).  Liver Function Tests: Recent Labs  Lab 07/18/17 0716 07/21/17 0425 07/23/17 0619  AST 28 33 24  ALT 17 22 22   ALKPHOS 164* 174* 145*  BILITOT 1.0 1.9* 1.1  PROT 4.3* 5.4* 5.1*  ALBUMIN 2.0* 2.6* 2.5*   HbA1C: Hgb A1c MFr Bld  Date/Time Value Ref Range Status  06/09/2017 05:13 AM 5.6 4.8 - 5.6 % Final    Comment:    (NOTE) Pre diabetes:  5.7%-6.4% Diabetes:              >6.4% Glycemic control for   <7.0% adults with diabetes      Recent Results (from the past 240 hour(s))  Culture, blood (routine x 2) Call MD if unable to obtain prior to antibiotics being given     Status: None   Collection Time: 07/17/17  3:35 AM  Result Value Ref Range Status   Specimen Description BLOOD LEFT HAND  Final   Special Requests IN PEDIATRIC BOTTLE Blood Culture adequate volume  Final   Culture NO GROWTH 5 DAYS  Final   Report Status 07/22/2017 FINAL  Final  Culture, blood (routine x 2) Call MD if unable to obtain prior to antibiotics being given     Status: None   Collection Time: 07/17/17  3:36 AM  Result Value Ref Range Status   Specimen Description BLOOD LEFT HAND  Final   Special Requests IN PEDIATRIC BOTTLE Blood Culture adequate volume  Final   Culture NO GROWTH 5 DAYS  Final   Report Status 07/22/2017 FINAL  Final  Gram stain     Status: None   Collection Time: 07/19/17  4:17 PM  Result Value Ref Range Status   Specimen Description PLEURAL  Final   Special Requests NONE  Final   Gram Stain   Final    CYTOSPIN SMEAR WBC PRESENT, PREDOMINANTLY PMN NO ORGANISMS SEEN    Report Status 07/19/2017 FINAL  Final  Culture, body fluid-bottle     Status: None   Collection Time: 07/19/17  4:17 PM  Result Value Ref Range Status   Specimen Description PLEURAL  Final   Special Requests NONE  Final   Culture NO GROWTH 5 DAYS  Final   Report Status 07/24/2017 FINAL  Final  MRSA PCR Screening     Status: None   Collection  Time: 07/21/17  7:21 PM  Result Value Ref Range Status   MRSA by PCR NEGATIVE NEGATIVE Final    Comment:        The GeneXpert MRSA Assay (FDA approved for NASAL specimens only), is one component of a comprehensive MRSA colonization surveillance program. It is not intended to diagnose MRSA infection nor to guide or monitor treatment for MRSA infections.      Scheduled Meds: . chlorhexidine  15 mL Mouth Rinse BID  . Chlorhexidine Gluconate Cloth  6 each Topical Daily  . digoxin  0.125 mg Intravenous Q8H  . [START ON 07/25/2017] digoxin  0.25 mg Oral Daily  . [START ON 07/25/2017] diltiazem  300 mg Oral Daily  . diltiazem  120 mg Oral Q8H  . feeding supplement (ENSURE ENLIVE)  237 mL Oral TID BM  . levETIRAcetam  500 mg Oral BID  . mouth rinse  15 mL Mouth Rinse q12n4p  . sodium chloride flush  10-40 mL Intracatheter Q12H  . thiamine  100 mg Oral Daily     LOS: 12 days   Cherene Altes, MD [facility] Office  [phone] Pager - Text Page per Shea Evans as per below:  On-Call/Text Page:      [email or web address]      password [email or web address]  If 7PM-7AM, please contact night-coverage [email or web address] Password [email or web address] 07/24/2017, 4:35 PM

## 2017-07-25 DIAGNOSIS — R0602 Shortness of breath: Secondary | ICD-10-CM

## 2017-07-25 DIAGNOSIS — Z515 Encounter for palliative care: Secondary | ICD-10-CM

## 2017-07-25 MED ORDER — SALINE SPRAY 0.65 % NA SOLN
1.0000 | NASAL | Status: DC | PRN
  Administered 2017-07-25 – 2017-07-26 (×3): 1 via NASAL
  Filled 2017-07-25: qty 44

## 2017-07-25 NOTE — Plan of Care (Signed)
Patient's dressing on left AKA is clean and intact. Patient was educated about her nectar thick diet and the importance of thickening liquid.

## 2017-07-25 NOTE — Progress Notes (Signed)
Progress Note  Patient Name: Shelley [name] Date of Encounter: 07/25/2017  Primary Cardiologist: Cristopher Peru, MD   Subjective   Denies chest pain.  She has some shortness of breath that she and her husband report is chronic.  Denies palpitations.    Inpatient Medications    Scheduled Meds: . chlorhexidine  15 mL Mouth Rinse BID  . Chlorhexidine Gluconate Cloth  6 each Topical Daily  . digoxin  0.25 mg Oral Daily  . diltiazem  300 mg Oral Daily  . levETIRAcetam  500 mg Oral BID  . mouth rinse  15 mL Mouth Rinse q12n4p  . sodium chloride flush  10-40 mL Intracatheter Q12H  . thiamine  100 mg Oral Daily   Continuous Infusions:  PRN Meds: acetaminophen **OR** acetaminophen, alum & mag hydroxide-simeth, feeding supplement (ENSURE ENLIVE), haloperidol lactate, levalbuterol, LORazepam, metoprolol tartrate, morphine injection, ondansetron, oxyCODONE, polyethylene glycol, RESOURCE THICKENUP CLEAR, sodium chloride flush   Vital Signs    Vitals:   07/25/17 0300 07/25/17 0700 07/25/17 0800 07/25/17 1100  BP: 113/73     Pulse: 97   98  Resp: 18     Temp: 98.7 F (37.1 C) 97.9 F (36.6 C)  98 F (36.7 C)  TempSrc: Oral Oral  Oral  SpO2: 100%  100%   Weight:      Height:        Intake/Output Summary (Last 24 hours) at 07/25/2017 1607 Last data filed at 07/25/2017 1100 Gross per 24 hour  Intake 130 ml  Output 1850 ml  Net -1720 ml   Filed Weights   07/13/17 1741 07/14/17 0545 07/22/17 0447  Weight: 152 lb 5.4 oz (69.1 kg) 148 lb 5.9 oz (67.3 kg) 153 lb 10.6 oz (69.7 kg)    Telemetry    Atrial fibrillation.  Rate mostly 90s. - Personally Reviewed  ECG    N/a  - Personally Reviewed  Physical Exam   VS:  BP 113/73   Pulse 98   Temp 98 F (36.7 C) (Oral)   Resp 18   Ht 5\' 5"  (1.651 m)   Wt 153 lb 10.6 oz (69.7 kg)   SpO2 100%   BMI 25.57 kg/m  , BMI Body mass index is 25.57 kg/m. GENERAL:  Chronically ill-appearing.  No acute distress.   HEENT: Pupils  equal round and reactive, fundi not visualized, oral mucosa unremarkable NECK:  No jugular venous distention, waveform within normal limits, carotid upstroke brisk and symmetric, no bruits LUNGS: CTAB.  No crackles, wheezes, or rhonchi. HEART:Irregularly irregular.   PMI not displaced or sustained,S1 and S2 within normal limits, no S3, no S4, no clicks, no rubs, no murmurs ABD:  Flat, positive bowel sounds normal in frequency in pitch, no bruits, no rebound, no guarding, no midline pulsatile mass, no hepatomegaly, no splenomegaly EXT:  L AKA.  no edema, no cyanosis no clubbing SKIN:  No rashes no nodules NEURO:  Cranial nerves II through XII grossly intact, motor grossly intact throughout [facility]:   Oriented to person but not place or time   Labs    Chemistry Recent Labs  Lab 07/21/17 0425 07/22/17 0522 07/23/17 0619  NA 138 143 144  K 3.7 4.0 4.3  CL 100* 106 111  CO2 24 26 24   GLUCOSE 114* 111* 122*  BUN 11 20 14   CREATININE 0.60 0.52 0.47  CALCIUM 8.4* 8.3* 8.7*  PROT 5.4*  --  5.1*  ALBUMIN 2.6*  --  2.5*  AST 33  --  24  ALT 22  --  22  ALKPHOS 174*  --  145*  BILITOT 1.9*  --  1.1  GFRNONAA >60 >60 >60  GFRAA >60 >60 >60  ANIONGAP 14 11 9      Hematology Recent Labs  Lab 07/21/17 0425 07/22/17 0522 07/23/17 0619  WBC 17.3* 14.0* 12.7*  RBC 3.70*  3.70* 3.54* 3.65*  HGB 10.8* 10.2* 10.5*  HCT 32.4* 31.9* 33.2*  MCV 87.6 90.1 91.0  MCH 29.2 28.8 28.8  MCHC 33.3 32.0 31.6  RDW 20.7* 20.7* 21.0*  PLT 188 208 232    Cardiac EnzymesNo results for input(s): TROPONINI in the last 168 hours. No results for input(s): TROPIPOC in the last 168 hours.   BNPNo results for input(s): BNP, PROBNP in the last 168 hours.   DDimer No results for input(s): DDIMER in the last 168 hours.   Radiology    No results found.  Cardiac Studies   Echo 07/13/17: Study Conclusions  - Left ventricle: The cavity size was normal. Systolic function was   vigorous. The estimated  ejection fraction was in the range of 65%   to 70%. Wall motion was normal; there were no regional wall   motion abnormalities. The study was not technically sufficient to   allow evaluation of LV diastolic dysfunction due to atrial   fibrillation. - Aortic valve: There was trivial regurgitation. - Aorta: Aortic root dimension: 40 mm (ED). - Aortic root: The aortic root was mildly dilated. - Mitral valve: There was trivial regurgitation. - Tricuspid valve: There was mild-moderate regurgitation. - Pulmonic valve: There was trivial regurgitation. - Pulmonary arteries: PA peak pressure: 32 mm Hg (S).   Patient Profile     72 y.o. female with prior stroke, hypertension, chronic diastolic heart failure, vascular dementia, and paroxysmal atrial fibrillation here with L foot pain now s/p L thrombolectomy and AKA.   Assessment & Plan    # Persistant atrial fibrillation: Heparin stopped 2/2 worsening anemia.  Rate is better since starting digoxin 1/22.  Continue diltiazem and digoxin.  She will need a level checked on 1/25.    # Hypertension: BP well-controlled.  Continue diltiazem.   # Chronic diastolic heart failure: Stable.  Blood pressure control as above.   For questions or updates, please contact [place] Please consult [email or web address] for contact info under Cardiology/STEMI.      Signed, Skeet Latch, MD  07/25/2017, 4:07 PM

## 2017-07-25 NOTE — Progress Notes (Signed)
[facility] PROGRESS NOTE  Shelley [name] TSV:[id number] DOB: 1946/06/11 DOA: 07/12/2017  PCP: [name], MD  Brief History/Interval Summary: 72 year old female with a past medical history of alcohol abuse, anxiety disorder, stroke, seizures, hypertension, dementia who initially presented with left foot pain.  This was secondary to acute ischemia.  She was taken to the operating room emergently and underwent thrombectomy.  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  Since then patient has slowly declined.  Palliative medicine was consulted.  Consultants:  [facility] PCCM Cardiology Palliative Care   Antimicrobials:  Cefepime 1/14> 1/18 Vancomycin 1/14> 1/18  Procedures:  1/10 admit w/ L leg pain - taken to OR emergently w/ extensive thrombectomy - L IJ CVL 1/11 L AKA and L common femoral thrombectomy  1/17 L thoracentesis in IR > 530ml - transudate    Subjective/Interval History: Patient appears to be confused this morning.  Asking for coffee.  Not able to get much information from her.  She denies any pain.  Objective:  Vital Signs  Vitals:   07/25/17 0300 07/25/17 0700 07/25/17 0800 07/25/17 1100  BP: 113/73     Pulse: 97   98  Resp: 18     Temp: 98.7 F (37.1 C) 97.9 F (36.6 C)  98 F (36.7 C)  TempSrc: Oral Oral  Oral  SpO2: 100%  100%   Weight:      Height:        Intake/Output Summary (Last 24 hours) at 07/25/2017 1321 Last data filed at 07/25/2017 1100 Gross per 24 hour  Intake 130 ml  Output 1850 ml  Net -1720 ml   Filed Weights   07/13/17 1741 07/14/17 0545 07/22/17 0447  Weight: 69.1 kg (152 lb 5.4 oz) 67.3 kg (148 lb 5.9 oz) 69.7 kg (153 lb 10.6 oz)    General appearance: alert, distracted and no distress Head: Normocephalic, without obvious abnormality, atraumatic Resp: Diminished air entry at the bases.  No definite crackles wheezing or Cardio: regular rate and rhythm, S1, S2 normal, no murmur, click, rub or gallop GI: soft, non-tender;  bowel sounds normal; no masses,  no organomegaly Extremities: Left AKA. Neurologic: Distracted.  No obvious focal neurological deficits.  Lab Results:  Data Reviewed: I have personally reviewed following labs and imaging studies  CBC: Recent Labs  Lab 07/19/17 0500 07/19/17 0923 07/20/17 0205 07/21/17 0425 07/22/17 0522 07/23/17 0619  WBC 12.5*  --  17.7* 17.3* 14.0* 12.7*  HGB 5.9* 6.4* 9.9* 10.8* 10.2* 10.5*  HCT 18.2* 19.6* 30.2* 32.4* 31.9* 33.2*  MCV 92.9  --  86.0 87.6 90.1 91.0  PLT 162  --  100* 188 208 923    Basic Metabolic Panel: Recent Labs  Lab 07/18/17 1611 07/19/17 0500 07/20/17 0205 07/21/17 0425 07/22/17 0522 07/23/17 0619  NA 143  --   --  138 143 144  K 3.9  --   --  3.7 4.0 4.3  CL 110  --   --  100* 106 111  CO2 26  --   --  24 26 24   GLUCOSE 145*  --   --  114* 111* 122*  BUN 12  --   --  11 20 14   CREATININE 0.58  --   --  0.60 0.52 0.47  CALCIUM 7.5*  --   --  8.4* 8.3* 8.7*  MG  --  2.1 1.8 1.7  --   --     GFR: Estimated Creatinine Clearance: 61.4 mL/min (by C-G formula  based on SCr of 0.47 mg/dL).  Liver Function Tests: Recent Labs  Lab 07/21/17 0425 07/23/17 0619  AST 33 24  ALT 22 22  ALKPHOS 174* 145*  BILITOT 1.9* 1.1  PROT 5.4* 5.1*  ALBUMIN 2.6* 2.5*     Recent Results (from the past 240 hour(s))  Culture, blood (routine x 2) Call MD if unable to obtain prior to antibiotics being given     Status: None   Collection Time: 07/17/17  3:35 AM  Result Value Ref Range Status   Specimen Description BLOOD LEFT HAND  Final   Special Requests IN PEDIATRIC BOTTLE Blood Culture adequate volume  Final   Culture NO GROWTH 5 DAYS  Final   Report Status 07/22/2017 FINAL  Final  Culture, blood (routine x 2) Call MD if unable to obtain prior to antibiotics being given     Status: None   Collection Time: 07/17/17  3:36 AM  Result Value Ref Range Status   Specimen Description BLOOD LEFT HAND  Final   Special Requests IN PEDIATRIC  BOTTLE Blood Culture adequate volume  Final   Culture NO GROWTH 5 DAYS  Final   Report Status 07/22/2017 FINAL  Final  Gram stain     Status: None   Collection Time: 07/19/17  4:17 PM  Result Value Ref Range Status   Specimen Description PLEURAL  Final   Special Requests NONE  Final   Gram Stain   Final    CYTOSPIN SMEAR WBC PRESENT, PREDOMINANTLY PMN NO ORGANISMS SEEN    Report Status 07/19/2017 FINAL  Final  Culture, body fluid-bottle     Status: None   Collection Time: 07/19/17  4:17 PM  Result Value Ref Range Status   Specimen Description PLEURAL  Final   Special Requests NONE  Final   Culture NO GROWTH 5 DAYS  Final   Report Status 07/24/2017 FINAL  Final  MRSA PCR Screening     Status: None   Collection Time: 07/21/17  7:21 PM  Result Value Ref Range Status   MRSA by PCR NEGATIVE NEGATIVE Final    Comment:        The GeneXpert MRSA Assay (FDA approved for NASAL specimens only), is one component of a comprehensive MRSA colonization surveillance program. It is not intended to diagnose MRSA infection nor to guide or monitor treatment for MRSA infections.       Radiology Studies: No results found.   Medications:  Scheduled: . chlorhexidine  15 mL Mouth Rinse BID  . Chlorhexidine Gluconate Cloth  6 each Topical Daily  . digoxin  0.25 mg Oral Daily  . diltiazem  300 mg Oral Daily  . levETIRAcetam  500 mg Oral BID  . mouth rinse  15 mL Mouth Rinse q12n4p  . sodium chloride flush  10-40 mL Intracatheter Q12H  . 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  Palliative medicine was consulted.  They are discussing with patient and family member.  It appears that comfort care is mainstay.    L pleural effusion - Aspiration pneumonitis/pneumonia - acute hypoxic respiratory failure- recurring aspiration Progressively worsening - hx most c/w recurring aspiration - abx were not proving helpful therefore currently off abx - this appears to be an untreatable issue - Palliative Care consulted to assist in establishing goals of care   Dysphagia - recurring aspiration Pt has refused MBS or FEES - appears to be aspirating all consistencies tried at bedside - there is no "safe" diet to allow her at this time, but she will not tolerate/allow an NG feeding tube.  Situation was discussed with her husband and it was felt that she be allowed to eat accepting the risk of aspiration. Her dementia appears to have progressed to the early end-stages such that her ability to safely maintain nourishment is markedly impaired.  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  Appears to be stable from this standpoint   Persistent atrial fibrillation Heart rate has been suboptimally controlled.  Patient often refusing meds. Heparin gtt stopped due to acute worsening of anemia (>2g Hgb drop).  Cardiology is following as well.  Currently on diltiazem as well as digoxin.  Anemia of unclear etiology  No signs of overt bleeding. LDH and Haptoglobin not c/w hemolysis - responded better than expected to 2U PRBC - CT abd w/o evidence of acute  bleed.  Hemoglobin has been stable.  No further workup at this time.  Altered mental status - vascular dementia - early end-stage dementia/Hx CVA Patient has been uncooperative.  CT head did not show any acute process but did reveal severe vascular ischemic changes suggesting vascular dementia.    Chronic grade 2 diastolic CHF (per TTE Dec 2018) Appears euvolemic at present - poor intake off IVF thus far therefore volume overload has not been a problem  Hypotension  Resolved w/ simple volume expansion   RUL nodular densities CT angio noted spiculated mass 2 - PET recommended as outpt. Given progressing dementia and issues discussed above further evaluation would not be appropriate or helpful   Thrombocytopenia (baseline~180) Likely due to EtOH abuse   EtOH abuse Now beyond the point at which ongoing withdrawal would be expected   Moderate malnutrition in context of acute illness/injury   DVT Prophylaxis: SCD's    Code Status: DNR Family Communication: No family at bedside Disposition Plan: Management as outlined above.    LOS: 13 days   [place] [facility] Pager [phone] 07/25/2017, 1:21 PM  If 7PM-7AM, please contact night-coverage at [email or web address], password [facility]

## 2017-07-25 NOTE — Progress Notes (Signed)
Daily Progress Note   Patient Name: Shelley [name]       Date: 07/25/2017 DOB: [birth year]  Age: 72 y.o. MRN#: [id number] Attending Physician: Bonnielee Haff, MD Primary Care Physician: [name], MD Admit Date: 07/12/2017  Reason for Consultation/Follow-up: Establishing goals of care  Subjective: Shelley [name] is more alert today to person and place but not to situation. Very congested cough and worsened after a few bites of applesauce.   Length of Stay: 13  Current Medications: Scheduled Meds:  . chlorhexidine  15 mL Mouth Rinse BID  . Chlorhexidine Gluconate Cloth  6 each Topical Daily  . digoxin  0.25 mg Oral Daily  . diltiazem  300 mg Oral Daily  . levETIRAcetam  500 mg Oral BID  . mouth rinse  15 mL Mouth Rinse q12n4p  . sodium chloride flush  10-40 mL Intracatheter Q12H  . thiamine  100 mg Oral Daily    Continuous Infusions:   PRN Meds: acetaminophen **OR** acetaminophen, alum & mag hydroxide-simeth, feeding supplement (ENSURE ENLIVE), haloperidol lactate, levalbuterol, LORazepam, metoprolol tartrate, morphine injection, ondansetron, oxyCODONE, polyethylene glycol, RESOURCE THICKENUP CLEAR, sodium chloride flush  Physical Exam  Constitutional: She appears well-developed.  HENT:  Head: Normocephalic and atraumatic.  Cardiovascular: Tachycardia present.  Pulmonary/Chest: No accessory muscle usage. No tachypnea. No respiratory distress. She has decreased breath sounds. She has rhonchi in the right lower field and the left lower field.  8L nasal cannula  Abdominal: Soft. Normal appearance.  Neurological: She is alert. She is disoriented.  Nursing note and vitals reviewed.           Vital Signs: BP 113/73   Pulse 98   Temp 98 F (36.7 C) (Oral)   Resp 18   Ht _0   (1.651 m)   Wt 69.7 kg (153 lb 10.6 oz)   SpO2 100%   BMI 25.57 kg/m  SpO2: SpO2: 100 % O2 Device: O2 Device: High Flow Nasal Cannula O2 Flow Rate: O2 Flow Rate (L/min): (S) 8 L/min(Weaned to 8L with sats of 100%. RN aware. )  Intake/output summary:   Intake/Output Summary (Last 24 hours) at 07/25/2017 1307 Last data filed at 07/25/2017 1100 Gross per 24 hour  Intake 130 ml  Output 1850 ml  Net -1720 ml   LBM: Last BM Date: 07/23/17 Baseline Weight:  Weight: 69.1 kg (152 lb 5.4 oz) Most recent weight: Weight: 69.7 kg (153 lb 10.6 oz)       Palliative Assessment/Data: 30%   Flowsheet Rows     Most Recent Value  Intake Tab  Referral Department  Hospitalist  Unit at Time of Referral  Intermediate Care Unit  Palliative Care Primary Diagnosis  Cardiac  Date Notified  07/21/17  Palliative Care Type  New Palliative care  Reason for referral  Clarify Goals of Care  Date of Admission  07/12/17  Date first seen by Palliative Care  07/22/17  # of days Palliative referral response time  1 Day(s)  # of days IP prior to Palliative referral  9  Clinical Assessment  Palliative Performance Scale Score  30%  Pain Max last 24 hours  Not able to report  Pain Min Last 24 hours  Not able to report  Dyspnea Max Last 24 Hours  Not able to report  Dyspnea Min Last 24 hours  Not able to report  Nausea Max Last 24 Hours  Not able to report  Nausea Min Last 24 Hours  Not able to report  Anxiety Max Last 24 Hours  Not able to report  Anxiety Min Last 24 Hours  Not able to report  Other Max Last 24 Hours  Not able to report  Psychosocial & Spiritual Assessment  Palliative Care Outcomes  Patient/Family meeting held?  No  Palliative Care follow-up planned  Yes, Facility      Patient Active Problem List   Diagnosis Date Noted  . ESRD (end stage renal disease) on dialysis ([place])   . Dysphagia   . Goals of care, counseling/discussion   . Palliative care by specialist   . Persistent atrial  fibrillation ([place])   . Acute respiratory failure with hypoxia ([place])   . Malnutrition of moderate degree 07/17/2017  . Hypoxia   . Atelectasis   . Pleural effusion   . Tobacco abuse   . Lung nodule   . Thromboembolism ([place]) 07/12/2017  . 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 Adjustment disorder with mixed anxiety and depressed mood   . Left pontine cerebrovascular accident ([place]) 06/13/2017  . Benign essential HTN   . Diastolic dysfunction   . Hypokalemia   . Stroke (cerebrum) ([place]) 06/10/2017  . Acute lacunar stroke ([place]) 06/08/2017  . Diplopia 06/08/2017  . Hypertension   . Anxiety   . Seizures ([place])   . Encounter for screening colonoscopy 05/14/2012    Palliative Care Assessment & Plan   HPI: 72 y.o. female  with past medical history of hypertension, seizure disorder, recent CVA Dec 4097, diastolic heart failure grade 2, osteoporosis, alcohol use disorder, vascular dementia, thrombocytopenia, lung nodule, atrial fib admitted on 07/12/2017 with leg pain.  Patient was taken to the OR emergently with extensive thrombectomy.  On 07/13/2017 she underwent a left AKA, femoral thrombectomy.  On 07/19/2017 secondary to worsening dyspnea, underwent left thoracentesis for 550 cc. Hospitalization complicated by ongoing severe aspiration and respiratory decline along with very poor intake.  Patient is refusing some aspects of treatment. Palliative care consult for goals of care.   Assessment: I met today with Shelley [name] who is more alert but confused. The past few days she has fluctuated with alertness and extremely lethargic. I had a long conversation with her son, Gerald Stabs, at family request regarding poor prognosis with severe aspiration risk and poor intake. Gerald Stabs explains that he believes that his mother  has really wanted to die for a long time now and that is why she would not go to the doctor and would stay at home and drink alcohol. He shares that multiple  people in her family have committed suicide and that she has never even attempted and he doesn't think she would but he also believes she has been very depressed and has no will to live.   Gerald Stabs is tearful as we discuss that she has very serious health conditions now that are unlikely to improve. We discussed what his mother would want and the best way we can care for her. We discussed comfort care and hospice. Gerald Stabs would like to talk with his stepfather and Shelley [name]'s brother about these options.   Recommendations/Plan:  Pain: Continue with morphine IV 1-2 mg every 2 hours as needed; oxycodone 5-10 mg orally every 6 hours as needed.  Added prn Tylenol for headache.   Agitation: He with Ativan 1-2 mg IV every 4 hours as needed, Haldol 2-5 mg IV every 6 hours as needed  Dyspnea: Continue with targeted pulmonary treatments such as oxygen, nebulizer treatments but also utilization of opioids for severe dyspnea  Code Status:  DNR  Prognosis:   Prognosis grim with continued respiratory decline combined with severe aspiration risk and poor intake. < 2 weeks very likely and probably eligible for hospice facility but she desires to go home. Will speak with family regarding home with hospice option.   Discharge Planning:  To Be Determined   Thank you for allowing the Palliative Medicine Team to assist in the care of this patient.   Total Time 40 min Prolonged Time Billed  no       Greater than 50%  of this time was spent counseling and coordinating care related to the above assessment and plan.  Vinie Sill, NP Palliative Medicine Team Pager # [phone] (M-F 8a-5p) Team Phone # [phone] (Nights/Weekends)

## 2017-07-26 DIAGNOSIS — R630 Anorexia: Secondary | ICD-10-CM

## 2017-07-26 NOTE — Progress Notes (Signed)
Daily Progress Note   Patient Name: Shelley [name]       Date: 07/26/2017 DOB: [birth year]  Age: 72 y.o. MRN#: [id number] Attending Physician: Bonnielee Haff, MD Primary Care Physician: [name], MD Admit Date: 07/12/2017  Reason for Consultation/Follow-up: Establishing goals of care  Subjective: Shelley [name] continues to be alert. When asked how she feels today she replies "like shit." Cannot discern why she doesn't feel good. I believe weakness and fatigue.   Length of Stay: 14  Current Medications: Scheduled Meds:  . chlorhexidine  15 mL Mouth Rinse BID  . Chlorhexidine Gluconate Cloth  6 each Topical Daily  . digoxin  0.25 mg Oral Daily  . diltiazem  300 mg Oral Daily  . levETIRAcetam  500 mg Oral BID  . mouth rinse  15 mL Mouth Rinse q12n4p  . sodium chloride flush  10-40 mL Intracatheter Q12H  . thiamine  100 mg Oral Daily    Continuous Infusions:   PRN Meds: acetaminophen **OR** acetaminophen, alum & mag hydroxide-simeth, feeding supplement (ENSURE ENLIVE), haloperidol lactate, levalbuterol, LORazepam, metoprolol tartrate, morphine injection, ondansetron, oxyCODONE, polyethylene glycol, RESOURCE THICKENUP CLEAR, sodium chloride, sodium chloride flush  Physical Exam  Constitutional: She appears well-developed.  HENT:  Head: Normocephalic and atraumatic.  Cardiovascular: Tachycardia present.  Pulmonary/Chest: No accessory muscle usage. No tachypnea. She is in respiratory distress.  SOB at rest  Abdominal: Soft. Normal appearance.  Neurological: She is alert. She is disoriented.  Nursing note and vitals reviewed.           Vital Signs: BP 105/75   Pulse (!) 103   Temp 97.8 F (36.6 C) (Oral)   Resp (!) 25   Ht 5\' 5"  (1.651 m)   Wt 69.7 kg (153 lb 10.6 oz)   SpO2  95%   BMI 25.57 kg/m  SpO2: SpO2: 95 % O2 Device: O2 Device: Nasal Cannula O2 Flow Rate: O2 Flow Rate (L/min): 3 L/min  Intake/output summary:   Intake/Output Summary (Last 24 hours) at 07/26/2017 1448 Last data filed at 07/26/2017 0900 Gross per 24 hour  Intake 60 ml  Output 350 ml  Net -290 ml   LBM: Last BM Date: 07/25/17 Baseline Weight: Weight: 69.1 kg (152 lb 5.4 oz) Most recent weight: Weight: 69.7 kg (153 lb 10.6 oz)  Palliative Assessment/Data: 30%   Flowsheet Rows     Most Recent Value  Intake Tab  Referral Department  Hospitalist  Unit at Time of Referral  Intermediate Care Unit  Palliative Care Primary Diagnosis  Cardiac  Date Notified  07/21/17  Palliative Care Type  New Palliative care  Reason for referral  Clarify Goals of Care  Date of Admission  07/12/17  Date first seen by Palliative Care  07/22/17  # of days Palliative referral response time  1 Day(s)  # of days IP prior to Palliative referral  9  Clinical Assessment  Palliative Performance Scale Score  30%  Pain Max last 24 hours  Not able to report  Pain Min Last 24 hours  Not able to report  Dyspnea Max Last 24 Hours  Not able to report  Dyspnea Min Last 24 hours  Not able to report  Nausea Max Last 24 Hours  Not able to report  Nausea Min Last 24 Hours  Not able to report  Anxiety Max Last 24 Hours  Not able to report  Anxiety Min Last 24 Hours  Not able to report  Other Max Last 24 Hours  Not able to report  Psychosocial & Spiritual Assessment  Palliative Care Outcomes  Patient/Family meeting held?  No  Palliative Care follow-up planned  Yes, Facility      Patient Active Problem List   Diagnosis Date Noted  . Shortness of breath   . Palliative care encounter   . Dysphagia   . Goals of care, counseling/discussion   . Palliative care by specialist   . Persistent atrial fibrillation ([place])   . Acute respiratory failure with hypoxia ([place])   . Malnutrition of moderate degree  07/17/2017  . Hypoxia   . Atelectasis   . Pleural effusion   . Tobacco abuse   . Lung nodule   . Thromboembolism ([place]) 07/12/2017  . 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 Adjustment disorder with mixed anxiety and depressed mood   . Left pontine cerebrovascular accident ([place]) 06/13/2017  . Benign essential HTN   . Diastolic dysfunction   . Hypokalemia   . Stroke (cerebrum) ([place]) 06/10/2017  . Acute lacunar stroke ([place]) 06/08/2017  . Diplopia 06/08/2017  . Hypertension   . Anxiety   . Seizures ([place])   . Encounter for screening colonoscopy 05/14/2012    Palliative Care Assessment & Plan   HPI: 72 y.o. female  with past medical history of hypertension, seizure disorder, recent CVA Dec 4268, diastolic heart failure grade 2, osteoporosis, alcohol use disorder, vascular dementia, thrombocytopenia, lung nodule, atrial fib admitted on 07/12/2017 with leg pain.  Patient was taken to the OR emergently with extensive thrombectomy.  On 07/13/2017 she underwent a left AKA, femoral thrombectomy.  On 07/19/2017 secondary to worsening dyspnea, underwent left thoracentesis for 550 cc. Hospitalization complicated by ongoing severe aspiration and respiratory decline along with very poor intake.  Patient is refusing some aspects of treatment. Palliative care consult for goals of care.   Assessment: Shelley [name] is alert and husband, cousin, and cousin's wife is at bedside. Shelley [name] is very fatigued today. Only requiring 3L nasal cannula now. However, she continues to refuse to eat/drink. I offered food and she says she just isn't hungry. She is preparing for a nap. I asked husband at bedside if he had questions/concerns. He tells me that he does not wish to discuss her decline today. Offered emotional support.   Recommendations/Plan:  Pain: Continue with morphine IV 1-2 mg every 2 hours as needed; oxycodone 5-10 mg orally every 6 hours as needed.  Added prn Tylenol for  headache.   Agitation: He with Ativan 1-2 mg IV every 4 hours as needed, Haldol 2-5 mg IV every 6 hours as needed  Dyspnea: Continue with targeted pulmonary treatments such as oxygen, nebulizer treatments but also utilization of opioids for severe dyspnea  Code Status:  DNR  Prognosis:   Prognosis grim with continued respiratory decline combined with severe aspiration risk and poor intake. < 2 weeks very likely and probably eligible for hospice facility but she desires to go home. Will speak with family regarding home with hospice option.   Discharge Planning:  To Be Determined   Thank you for allowing the Palliative Medicine Team to assist in the care of this patient.   Total Time 15 min Prolonged Time Billed  no       Greater than 50%  of this time was spent counseling and coordinating care related to the above assessment and plan.  Vinie Sill, NP Palliative Medicine Team Pager # [phone] (M-F 8a-5p) Team Phone # [phone] (Nights/Weekends)

## 2017-07-26 NOTE — Progress Notes (Signed)
[facility] PROGRESS NOTE  Shelley [name] CNO:[id number] DOB: 02/22/1946 DOA: 07/12/2017  PCP: [name], MD  Brief History/Interval Summary: 72 year old female with a past medical history of alcohol abuse, anxiety disorder, stroke, seizures, hypertension, dementia who initially presented with left foot pain.  This was secondary to acute ischemia.  She was taken to the operating room emergently and underwent thrombectomy.  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  Since then patient has slowly declined.  Palliative medicine was consulted.  Consultants:  [facility] Cardiology Palliative Care   Antimicrobials:  Cefepime 1/14> 1/18 Vancomycin 1/14> 1/18  Procedures:  1/10 admit w/ L leg pain - taken to OR emergently w/ extensive thrombectomy - L IJ CVL 1/11 L AKA and L common femoral thrombectomy  1/17 L thoracentesis in IR > 514ml - transudate    Subjective/Interval History: Patient remains distracted and confused.  Does not appear to be in any discomfort.    Objective:  Vital Signs  Vitals:   07/26/17 0433 07/26/17 0619 07/26/17 0704 07/26/17 0805  BP:    92/68  Pulse: 88 85 86 91  Resp: 16 16 15 17   Temp:  (!) 97.5 F (36.4 C)  97.8 F (36.6 C)  TempSrc:  Oral  Oral  SpO2: 98% 100% 100% 98%  Weight:      Height:        Intake/Output Summary (Last 24 hours) at 07/26/2017 0928 Last data filed at 07/25/2017 1900 Gross per 24 hour  Intake -  Output 500 ml  Net -500 ml   Filed Weights   07/13/17 1741 07/14/17 0545 07/22/17 0447  Weight: 69.1 kg (152 lb 5.4 oz) 67.3 kg (148 lb 5.9 oz) 69.7 kg (153 lb 10.6 oz)    General appearance: Awake alert.  Distracted.  In no distress. Resp: Diminished air entry at the bases.  No definite crackles wheezing or rhonchi. Cardio: S1-S2 is normal regular.  No S3-S4.  No rubs murmurs or bruit GI: Abdomen is soft.  Nontender nondistended.  Bowel sounds are present.  No masses organomegaly Extremities: Left  AKA. Neurologic: Distracted.  No obvious focal neurological deficits.  Lab Results:  Data Reviewed: I have personally reviewed following labs and imaging studies  CBC: Recent Labs  Lab 07/20/17 0205 07/21/17 0425 07/22/17 0522 07/23/17 0619  WBC 17.7* 17.3* 14.0* 12.7*  HGB 9.9* 10.8* 10.2* 10.5*  HCT 30.2* 32.4* 31.9* 33.2*  MCV 86.0 87.6 90.1 91.0  PLT 100* 188 208 294    Basic Metabolic Panel: Recent Labs  Lab 07/20/17 0205 07/21/17 0425 07/22/17 0522 07/23/17 0619  NA  --  138 143 144  K  --  3.7 4.0 4.3  CL  --  100* 106 111  CO2  --  24 26 24   GLUCOSE  --  114* 111* 122*  BUN  --  11 20 14   CREATININE  --  0.60 0.52 0.47  CALCIUM  --  8.4* 8.3* 8.7*  MG 1.8 1.7  --   --     GFR: Estimated Creatinine Clearance: 61.4 mL/min (by C-G formula based on SCr of 0.47 mg/dL).  Liver Function Tests: Recent Labs  Lab 07/21/17 0425 07/23/17 0619  AST 33 24  ALT 22 22  ALKPHOS 174* 145*  BILITOT 1.9* 1.1  PROT 5.4* 5.1*  ALBUMIN 2.6* 2.5*     Recent Results (from the past 240 hour(s))  Culture, blood (routine x 2) Call MD if unable to obtain prior to antibiotics being  given     Status: None   Collection Time: 07/17/17  3:35 AM  Result Value Ref Range Status   Specimen Description BLOOD LEFT HAND  Final   Special Requests IN PEDIATRIC BOTTLE Blood Culture adequate volume  Final   Culture NO GROWTH 5 DAYS  Final   Report Status 07/22/2017 FINAL  Final  Culture, blood (routine x 2) Call MD if unable to obtain prior to antibiotics being given     Status: None   Collection Time: 07/17/17  3:36 AM  Result Value Ref Range Status   Specimen Description BLOOD LEFT HAND  Final   Special Requests IN PEDIATRIC BOTTLE Blood Culture adequate volume  Final   Culture NO GROWTH 5 DAYS  Final   Report Status 07/22/2017 FINAL  Final  Gram stain     Status: None   Collection Time: 07/19/17  4:17 PM  Result Value Ref Range Status   Specimen Description PLEURAL  Final    Special Requests NONE  Final   Gram Stain   Final    CYTOSPIN SMEAR WBC PRESENT, PREDOMINANTLY PMN NO ORGANISMS SEEN    Report Status 07/19/2017 FINAL  Final  Culture, body fluid-bottle     Status: None   Collection Time: 07/19/17  4:17 PM  Result Value Ref Range Status   Specimen Description PLEURAL  Final   Special Requests NONE  Final   Culture NO GROWTH 5 DAYS  Final   Report Status 07/24/2017 FINAL  Final  MRSA PCR Screening     Status: None   Collection Time: 07/21/17  7:21 PM  Result Value Ref Range Status   MRSA by PCR NEGATIVE NEGATIVE Final    Comment:        The GeneXpert MRSA Assay (FDA approved for NASAL specimens only), is one component of a comprehensive MRSA colonization surveillance program. It is not intended to diagnose MRSA infection nor to guide or monitor treatment for MRSA infections.       Radiology Studies: No results found.   Medications:  Scheduled: . chlorhexidine  15 mL Mouth Rinse BID  . Chlorhexidine Gluconate Cloth  6 each Topical Daily  . digoxin  0.25 mg Oral Daily  . diltiazem  300 mg Oral Daily  . levETIRAcetam  500 mg Oral BID  . mouth rinse  15 mL Mouth Rinse q12n4p  . sodium chloride flush  10-40 mL Intracatheter Q12H  . 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  Palliative medicine was consulted.  They are discussing with patient and family member.  It does appears that the patient has stabilized some over the last 48 hours.  L pleural effusion - Aspiration pneumonitis/pneumonia - acute hypoxic respiratory failure-  recurring aspiration This was progressively worsening.  Patient has recurrent aspiration.  Palliative care is following.  She is off of antibiotics currently.    Dysphagia - recurring aspiration Pt has refused MBS or FEES - appears to be aspirating all consistencies tried at bedside - there is no "safe" diet to allow her at this time, but she will not tolerate/allow an NG feeding tube.  Situation was discussed with her husband and it was felt that she be allowed to eat accepting the risk of aspiration. Her dementia appears to have progressed to the early end-stages such that her ability to safely maintain nourishment is markedly impaired.  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  Appears to be stable from this standpoint   Persistent atrial fibrillation Heart rate has been suboptimally controlled.  Patient often refusing meds. Heparin gtt stopped due to acute worsening of anemia (>2g Hgb drop).  Cardiology is following as well.  Currently on diltiazem as well as digoxin.  Anemia of unclear etiology  No signs of overt bleeding. LDH and Haptoglobin not c/w hemolysis - responded better than expected to 2U PRBC - CT abd w/o evidence of acute bleed.  Hemoglobin has been stable.  No further workup at this time.  Altered mental status - vascular dementia - early end-stage dementia/Hx CVA Patient has been uncooperative.  CT head did not show any acute process but did reveal severe vascular ischemic changes suggesting vascular  dementia.    Chronic grade 2 diastolic CHF (per TTE Dec 2018) Appears euvolemic at present - poor intake off IVF thus far therefore volume overload has not been a problem  Hypotension  Resolved w/ simple volume expansion   RUL nodular densities CT angio noted spiculated mass 2 - PET recommended as outpt. Given progressing dementia and issues discussed above further evaluation would not be appropriate or helpful.  Thrombocytopenia (baseline~180) Likely due to EtOH abuse   EtOH abuse Now beyond the point at which ongoing withdrawal would be expected   Moderate malnutrition in context of acute illness/injury   DVT Prophylaxis: SCD's    Code Status: DNR Family Communication: No family at bedside Disposition Plan: Management as outlined above.  Disposition per palliative medicine.  They are discussing with family.    LOS: 14 days   [facility] Pager [phone] 07/26/2017, 9:28 AM  If 7PM-7AM, please contact night-coverage at [email or web address], password [facility]

## 2017-07-26 NOTE — Progress Notes (Signed)
Progress Note  Patient Name: Shelley [name] Date of Encounter: 07/26/2017  Primary Cardiologist: Cristopher Peru, MD   Subjective   Pt is alert and appropriate. No chest pain, dyspnea, lightheadedness or palpitations. She's tired, doesn't sleep well.   Inpatient Medications    Scheduled Meds: . chlorhexidine  15 mL Mouth Rinse BID  . Chlorhexidine Gluconate Cloth  6 each Topical Daily  . digoxin  0.25 mg Oral Daily  . diltiazem  300 mg Oral Daily  . levETIRAcetam  500 mg Oral BID  . mouth rinse  15 mL Mouth Rinse q12n4p  . sodium chloride flush  10-40 mL Intracatheter Q12H  . thiamine  100 mg Oral Daily   Continuous Infusions:  PRN Meds: acetaminophen **OR** acetaminophen, alum & mag hydroxide-simeth, feeding supplement (ENSURE ENLIVE), haloperidol lactate, levalbuterol, LORazepam, metoprolol tartrate, morphine injection, ondansetron, oxyCODONE, polyethylene glycol, RESOURCE THICKENUP CLEAR, sodium chloride, sodium chloride flush   Vital Signs    Vitals:   07/26/17 0433 07/26/17 0619 07/26/17 0704 07/26/17 0805  BP:    92/68  Pulse: 88 85 86 91  Resp: 16 16 15 17   Temp:  (!) 97.5 F (36.4 C)  97.8 F (36.6 C)  TempSrc:  Oral  Oral  SpO2: 98% 100% 100% 98%  Weight:      Height:        Intake/Output Summary (Last 24 hours) at 07/26/2017 1028 Last data filed at 07/25/2017 1900 Gross per 24 hour  Intake -  Output 500 ml  Net -500 ml   Filed Weights   07/13/17 1741 07/14/17 0545 07/22/17 0447  Weight: 152 lb 5.4 oz (69.1 kg) 148 lb 5.9 oz (67.3 kg) 153 lb 10.6 oz (69.7 kg)    Telemetry    Atrial fibrillation in the 80's-90's - Personally Reviewed  ECG    No new tracings - Personally Reviewed  Physical Exam   GEN: No acute distress.   Neck: No JVD Cardiac: Mildly irregular rhythm, no murmurs, rubs, or gallops.  Respiratory: Clear to auscultation bilaterally. GI: Soft, nontender, non-distended  MS: No edema; Left AKA with dressing and Ace Wrap Neuro:   Nonfocal  Psych: Normal affect   Labs    Chemistry Recent Labs  Lab 07/21/17 0425 07/22/17 0522 07/23/17 0619  NA 138 143 144  K 3.7 4.0 4.3  CL 100* 106 111  CO2 24 26 24   GLUCOSE 114* 111* 122*  BUN 11 20 14   CREATININE 0.60 0.52 0.47  CALCIUM 8.4* 8.3* 8.7*  PROT 5.4*  --  5.1*  ALBUMIN 2.6*  --  2.5*  AST 33  --  24  ALT 22  --  22  ALKPHOS 174*  --  145*  BILITOT 1.9*  --  1.1  GFRNONAA >60 >60 >60  GFRAA >60 >60 >60  ANIONGAP 14 11 9      Hematology Recent Labs  Lab 07/21/17 0425 07/22/17 0522 07/23/17 0619  WBC 17.3* 14.0* 12.7*  RBC 3.70*  3.70* 3.54* 3.65*  HGB 10.8* 10.2* 10.5*  HCT 32.4* 31.9* 33.2*  MCV 87.6 90.1 91.0  MCH 29.2 28.8 28.8  MCHC 33.3 32.0 31.6  RDW 20.7* 20.7* 21.0*  PLT 188 208 232    Cardiac EnzymesNo results for input(s): TROPONINI in the last 168 hours. No results for input(s): TROPIPOC in the last 168 hours.   BNPNo results for input(s): BNP, PROBNP in the last 168 hours.   DDimer No results for input(s): DDIMER in the last 168 hours.  Radiology    No results found.  Cardiac Studies   Echocardiogram 07/13/2017 Study Conclusions - Left ventricle: The cavity size was normal. Systolic function was   vigorous. The estimated ejection fraction was in the range of 65%   to 70%. Wall motion was normal; there were no regional wall   motion abnormalities. The study was not technically sufficient to   allow evaluation of LV diastolic dysfunction due to atrial   fibrillation. - Aortic valve: There was trivial regurgitation. - Aorta: Aortic root dimension: 40 mm (ED). - Aortic root: The aortic root was mildly dilated. - Mitral valve: There was trivial regurgitation. - Tricuspid valve: There was mild-moderate regurgitation. - Pulmonic valve: There was trivial regurgitation. - Pulmonary arteries: PA peak pressure: 32 mm Hg (S).  Patient Profile     72 y.o. female with prior stroke 06/2017, hypertension, chronic diastolic  heart failure, vascular dementia, and paroxysmal atrial fibrillation here with L foot pain now s/p L thrombectomy 07/12/17 and AKA 07/13/2017.   Assessment & Plan    Persistent atrial fibrillation -On diltiazem and digoxin for rate control. Plan for dig level on 1/25 -Rate better controlled since starting digoxin. Currently in the 80's-90's -Heparin stopped secondary to worsening anemia  Hypertension -BP soft but stable, SBP in the 90's today. -Continue current meds for now- diltiazem and digoxin  Chronic diastolic heart failure -Normal LV function by echo 07/13/2017.  -Wt stable. Lungs clear, no dyspnea.   Anemia -Unclear etiology, followed by primary team. Received 2 UPRBCs.  -Hgb stable at 10.5  Clinical decline -Pt with declining status over many days. She has dysphagia with risk for aspiration and moderate malnutriion. Dementia appears to have progressed. Palliative care has been consulted. Discussion with son revealed that pt has no will to live. They are in discussions regarding goals of care.     For questions or updates, please contact [place] Please consult [email or web address] for contact info under Cardiology/STEMI.      Signed, Daune Perch, NP  07/26/2017, 10:28 AM

## 2017-07-27 LAB — DIGOXIN LEVEL: Digoxin Level: 1.5 ng/mL (ref 0.8–2.0)

## 2017-07-27 MED ORDER — RESOURCE THICKENUP CLEAR PO POWD: ORAL | Status: DC | PRN

## 2017-07-27 MED ORDER — DIGOXIN 125 MCG PO TABS
0.1875 mg | ORAL_TABLET | Freq: Every day | ORAL | Status: DC
  Administered 2017-07-27 – 2017-07-31 (×5): 0.1875 mg via ORAL
  Filled 2017-07-27 (×3): qty 1
  Filled 2017-07-27: qty 2
  Filled 2017-07-27: qty 1

## 2017-07-27 NOTE — Progress Notes (Signed)
[facility] PROGRESS NOTE  Shelley [name]:[id number] DOB: 1945-10-14 DOA: 07/12/2017  PCP: [name], MD  Brief History/Interval Summary: 72 year old female with a past medical history of alcohol abuse, anxiety disorder, stroke, seizures, hypertension, dementia who initially presented with left foot pain.  This was secondary to acute ischemia.  She was taken to the operating room emergently and underwent thrombectomy.  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  Since then patient has slowly declined.  Palliative medicine was consulted.  Consultants:  [facility] Cardiology Palliative Care   Antimicrobials:  Cefepime 1/14> 1/18 Vancomycin 1/14> 1/18  Procedures:  1/10 admit w/ L leg pain - taken to OR emergently w/ extensive thrombectomy - L IJ CVL 1/11 L AKA and L common femoral thrombectomy  1/17 L thoracentesis in IR > 568ml - transudate    Subjective/Interval History: Patient denies any complaints per se.  She states that she wants to go back to sleep.  She denies any dizziness or lightheadedness.  Objective:  Vital Signs  Vitals:   07/27/17 0300 07/27/17 0400 07/27/17 0500 07/27/17 0751  BP: 92/62 102/70 110/83 (!) 88/65  Pulse: 81 79 85 90  Resp: 14 (!) 30 (!) 22 (!) 21  Temp: 98 F (36.7 C)   97.7 F (36.5 C)  TempSrc: Oral   Oral  SpO2: 99% 99% 99% 98%  Weight:      Height:        Intake/Output Summary (Last 24 hours) at 07/27/2017 1005 Last data filed at 07/27/2017 0752 Gross per 24 hour  Intake 120 ml  Output 1125 ml  Net -1005 ml   Filed Weights   07/13/17 1741 07/14/17 0545 07/22/17 0447  Weight: 69.1 kg (152 lb 5.4 oz) 67.3 kg (148 lb 5.9 oz) 69.7 kg (153 lb 10.6 oz)    General appearance: Awake alert.  No distress. Resp: Diminished air entry at the bases but without any wheezing rales or rhonchi. Cardio: S1-S2 is normal regular.  No S3-S4.  No rubs murmurs of bruit GI: Abdomen is soft.  Nontender nondistended. Extremities: Left  AKA. Neurologic: Distracted.  No obvious focal neurological deficits.  Lab Results:  Data Reviewed: I have personally reviewed following labs and imaging studies  CBC: Recent Labs  Lab 07/21/17 0425 07/22/17 0522 07/23/17 0619  WBC 17.3* 14.0* 12.7*  HGB 10.8* 10.2* 10.5*  HCT 32.4* 31.9* 33.2*  MCV 87.6 90.1 91.0  PLT 188 208 767    Basic Metabolic Panel: Recent Labs  Lab 07/21/17 0425 07/22/17 0522 07/23/17 0619  NA 138 143 144  K 3.7 4.0 4.3  CL 100* 106 111  CO2 24 26 24   GLUCOSE 114* 111* 122*  BUN 11 20 14   CREATININE 0.60 0.52 0.47  CALCIUM 8.4* 8.3* 8.7*  MG 1.7  --   --     GFR: Estimated Creatinine Clearance: 61.4 mL/min (by C-G formula based on SCr of 0.47 mg/dL).  Liver Function Tests: Recent Labs  Lab 07/21/17 0425 07/23/17 0619  AST 33 24  ALT 22 22  ALKPHOS 174* 145*  BILITOT 1.9* 1.1  PROT 5.4* 5.1*  ALBUMIN 2.6* 2.5*     Recent Results (from the past 240 hour(s))  Gram stain     Status: None   Collection Time: 07/19/17  4:17 PM  Result Value Ref Range Status   Specimen Description PLEURAL  Final   Special Requests NONE  Final   Gram Stain   Final    CYTOSPIN SMEAR WBC  PRESENT, PREDOMINANTLY PMN NO ORGANISMS SEEN    Report Status 07/19/2017 FINAL  Final  Culture, body fluid-bottle     Status: None   Collection Time: 07/19/17  4:17 PM  Result Value Ref Range Status   Specimen Description PLEURAL  Final   Special Requests NONE  Final   Culture NO GROWTH 5 DAYS  Final   Report Status 07/24/2017 FINAL  Final  MRSA PCR Screening     Status: None   Collection Time: 07/21/17  7:21 PM  Result Value Ref Range Status   MRSA by PCR NEGATIVE NEGATIVE Final    Comment:        The GeneXpert MRSA Assay (FDA approved for NASAL specimens only), is one component of a comprehensive MRSA colonization surveillance program. It is not intended to diagnose MRSA infection nor to guide or monitor treatment for MRSA infections.        Radiology Studies: No results found.   Medications:  Scheduled: . chlorhexidine  15 mL Mouth Rinse BID  . Chlorhexidine Gluconate Cloth  6 each Topical Daily  . digoxin  0.1875 mg Oral Daily  . diltiazem  300 mg Oral Daily  . levETIRAcetam  500 mg Oral BID  . mouth rinse  15 mL Mouth Rinse q12n4p  . sodium chloride flush  10-40 mL Intracatheter Q12H  . 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  They have had discussions with patient and family.  Patient appears to have stabilized over the last 3 days.  Await further input.  L pleural effusion - Aspiration pneumonitis/pneumonia - acute hypoxic respiratory failure- recurring aspiration This was progressively worsening.  Patient has recurrent aspiration.  Palliative care is following.  She is off of antibiotics currently.    Dysphagia - recurring aspiration Pt has refused MBS or FEES - appears to be aspirating all consistencies tried at bedside - there is no "safe" diet to allow her at this time, but she will not tolerate/allow an NG feeding tube.  Situation was discussed with her husband  and it was felt that she be allowed to eat accepting the risk of aspiration. Her dementia appears to have progressed to the early end-stages such that her ability to safely maintain nourishment is markedly impaired.  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  Appears to be stable from this standpoint   Persistent atrial fibrillation Heart rate has been suboptimally controlled.  Patient often refusing meds. Heparin gtt stopped due to acute worsening of anemia (>2g Hgb drop).  Following.  Heart rate is better controlled now.  She is currently on diltiazem and digoxin.  Blood pressure noted to be somewhat low this morning.  She is asymptomatic.  Continue to monitor.  Cardiology is recommending consideration of oral anticoagulation.  This will somewhat depend on goals of care conversations.    Anemia of unclear etiology  No signs of overt bleeding. LDH and Haptoglobin not c/w hemolysis - responded better than expected to 2U PRBC - CT abd w/o evidence of acute bleed.  Hemoglobin has been stable.  No further workup at this time.  Altered mental status - vascular dementia - early end-stage dementia/Hx CVA Patient has been uncooperative.  CT head did not show any acute process but did reveal severe vascular ischemic changes suggesting vascular dementia.    Chronic grade 2 diastolic CHF (per TTE Dec 2018) Appears euvolemic at present.  Hypotension  Resolved w/ simple volume expansion   RUL nodular densities CT angio noted spiculated mass 2 - PET recommended as outpt. Given progressing dementia and issues discussed above further evaluation would not be appropriate or helpful.  Thrombocytopenia (baseline~180) Likely due to EtOH abuse   EtOH abuse Stable  Moderate malnutrition in context of acute illness/injury   DVT Prophylaxis: SCD's    Code Status: DNR Family Communication: No family at bedside Disposition Plan: Management as outlined above.  Disposition  per palliative medicine.  Okay for transfer to floor.      LOS: 15 days   [facility] Pager [phone] 07/27/2017, 10:05 AM  If 7PM-7AM, please contact night-coverage at [email or web address], password [facility]

## 2017-07-27 NOTE — Progress Notes (Signed)
Progress Note  Patient Name: Shelley [name] Date of Encounter: 07/27/2017  Primary Cardiologist: Cristopher Peru, MD   Subjective   Confused about day/night.  Denies chest pain.  She is feeling a little short of breath this morning.  She denies orthopnea or PND.  She also denies palpitations.  Inpatient Medications    Scheduled Meds: . chlorhexidine  15 mL Mouth Rinse BID  . Chlorhexidine Gluconate Cloth  6 each Topical Daily  . digoxin  0.1875 mg Oral Daily  . diltiazem  300 mg Oral Daily  . levETIRAcetam  500 mg Oral BID  . mouth rinse  15 mL Mouth Rinse q12n4p  . sodium chloride flush  10-40 mL Intracatheter Q12H  . thiamine  100 mg Oral Daily   Continuous Infusions:  PRN Meds: acetaminophen **OR** acetaminophen, alum & mag hydroxide-simeth, feeding supplement (ENSURE ENLIVE), haloperidol lactate, levalbuterol, LORazepam, metoprolol tartrate, morphine injection, ondansetron, oxyCODONE, polyethylene glycol, RESOURCE THICKENUP CLEAR, sodium chloride, sodium chloride flush   Vital Signs    Vitals:   07/27/17 0300 07/27/17 0400 07/27/17 0500 07/27/17 0751  BP: 92/62 102/70 110/83 (!) 88/65  Pulse: 81 79 85 90  Resp: 14 (!) 30 (!) 22 (!) 21  Temp: 98 F (36.7 C)   97.7 F (36.5 C)  TempSrc: Oral   Oral  SpO2: 99% 99% 99% 98%  Weight:      Height:        Intake/Output Summary (Last 24 hours) at 07/27/2017 1000 Last data filed at 07/27/2017 0752 Gross per 24 hour  Intake 120 ml  Output 1125 ml  Net -1005 ml   Filed Weights   07/13/17 1741 07/14/17 0545 07/22/17 0447  Weight: 152 lb 5.4 oz (69.1 kg) 148 lb 5.9 oz (67.3 kg) 153 lb 10.6 oz (69.7 kg)    Telemetry    Atrial fibrillation.  Rate less than 100 bpm.. - Personally Reviewed  ECG    N/a  - Personally Reviewed  Physical Exam   VS:  BP (!) 88/65 (BP Location: Left Arm)   Pulse 90   Temp 97.7 F (36.5 C) (Oral)   Resp (!) 21   Ht 5\' 5"  (1.651 m)   Wt 153 lb 10.6 oz (69.7 kg)   SpO2 98%   BMI 25.57  kg/m  , BMI Body mass index is 25.57 kg/m. GENERAL:  Chronically ill-appearing.  No acute distress.   HEENT: Pupils equal round and reactive, fundi not visualized, oral mucosa unremarkable NECK:  No jugular venous distention, waveform within normal limits, carotid upstroke brisk and symmetric LUNGS: CTAB.  No crackles, wheezes, or rhonchi. HEART: Irregularly irregular.   PMI not displaced or sustained,S1 and S2 within normal limits, no S3, no S4, no clicks, no rubs, no murmurs ABD:  Flat, positive bowel sounds normal in frequency in pitch, no bruits, no rebound, no guarding, no midline pulsatile mass, no hepatomegaly, no splenomegaly EXT:  L AKA.  no edema, no cyanosis no clubbing SKIN:  No rashes no nodules NEURO:  Cranial nerves II through XII grossly intact, motor grossly intact throughout [employer]:   Oriented to person but not place or time   Labs    Chemistry Recent Labs  Lab 07/21/17 0425 07/22/17 0522 07/23/17 0619  NA 138 143 144  K 3.7 4.0 4.3  CL 100* 106 111  CO2 24 26 24   GLUCOSE 114* 111* 122*  BUN 11 20 14   CREATININE 0.60 0.52 0.47  CALCIUM 8.4* 8.3* 8.7*  PROT 5.4*  --  5.1*  ALBUMIN 2.6*  --  2.5*  AST 33  --  24  ALT 22  --  22  ALKPHOS 174*  --  145*  BILITOT 1.9*  --  1.1  GFRNONAA >60 >60 >60  GFRAA >60 >60 >60  ANIONGAP 14 11 9      Hematology Recent Labs  Lab 07/21/17 0425 07/22/17 0522 07/23/17 0619  WBC 17.3* 14.0* 12.7*  RBC 3.70*  3.70* 3.54* 3.65*  HGB 10.8* 10.2* 10.5*  HCT 32.4* 31.9* 33.2*  MCV 87.6 90.1 91.0  MCH 29.2 28.8 28.8  MCHC 33.3 32.0 31.6  RDW 20.7* 20.7* 21.0*  PLT 188 208 232    Cardiac EnzymesNo results for input(s): TROPONINI in the last 168 hours. No results for input(s): TROPIPOC in the last 168 hours.   BNPNo results for input(s): BNP, PROBNP in the last 168 hours.   DDimer No results for input(s): DDIMER in the last 168 hours.   Radiology    No results found.  Cardiac Studies   Echo 07/13/17: Study  Conclusions  - Left ventricle: The cavity size was normal. Systolic function was   vigorous. The estimated ejection fraction was in the range of 65%   to 70%. Wall motion was normal; there were no regional wall   motion abnormalities. The study was not technically sufficient to   allow evaluation of LV diastolic dysfunction due to atrial   fibrillation. - Aortic valve: There was trivial regurgitation. - Aorta: Aortic root dimension: 40 mm (ED). - Aortic root: The aortic root was mildly dilated. - Mitral valve: There was trivial regurgitation. - Tricuspid valve: There was mild-moderate regurgitation. - Pulmonic valve: There was trivial regurgitation. - Pulmonary arteries: PA peak pressure: 32 mm Hg (S).   Patient Profile     72 y.o. female with prior stroke, hypertension, chronic diastolic heart failure, vascular dementia, and paroxysmal atrial fibrillation here with L foot pain now s/p L thrombolectomy and AKA.   Assessment & Plan    # Persistant atrial fibrillation: Heparin stopped 2/2 worsening anemia.  Rate is better since starting digoxin 1/22.  Continue diltiazem.  Her digoxin level was 1.5 this morning.  We will reduce digoxin 0.1875 mg daily.  Repeat level in 3 days.  Given that she seems to have stabilized we will consider oral anticoagulation if okay with primary team.   # Hypertension: BP well-controlled.  Continue diltiazem.   # Chronic diastolic heart failure: Stable.  Blood pressure control as above.  We will follow her as needed over the weekend.  Please call if there are questions.  For questions or updates, please contact [place] Please consult [email or web address] for contact info under Cardiology/STEMI.      Signed, Skeet Latch, MD  07/27/2017, 10:00 AM

## 2017-07-27 NOTE — Progress Notes (Signed)
Daily Progress Note   Patient Name: Shelley [name]       Date: 07/27/2017 DOB: 26-Dec-1945  Age: 72 y.o. MRN#: [id number] Attending Physician: Bonnielee Haff, MD Primary Care Physician: [name], MD Admit Date: 07/12/2017  Reason for Consultation/Follow-up: Establishing goals of care, Hospice Evaluation and Psychosocial/spiritual support  Subjective: Patient seen, chart reviewed.  Patient's brother, and husband are at the bedside.  Patient is alert although becomes short of breath with minimal conversation.  She tells me that she is fine if she lives a long time, and she is also fine if she does not.  She does share, that she wants to spend what time she does have  around her children and grandchildren as well as her husband.  She would like to go home but is receptive to residential hospice or skilled nursing short-term as long as she can be close to her family.  I asked her if she thought she was dying and she replied "I do not know but it is fine if I am".  She denies suicidal ideation  The patient is now eating only bites and sips and states she has no appetite at all.  She also is aspirating all consistencies.  Length of Stay: 15  Current Medications: Scheduled Meds:  . chlorhexidine  15 mL Mouth Rinse BID  . Chlorhexidine Gluconate Cloth  6 each Topical Daily  . digoxin  0.1875 mg Oral Daily  . diltiazem  300 mg Oral Daily  . levETIRAcetam  500 mg Oral BID  . mouth rinse  15 mL Mouth Rinse q12n4p  . sodium chloride flush  10-40 mL Intracatheter Q12H  . thiamine  100 mg Oral Daily    Continuous Infusions:   PRN Meds: acetaminophen **OR** acetaminophen, alum & mag hydroxide-simeth, feeding supplement (ENSURE ENLIVE), haloperidol lactate, levalbuterol, LORazepam, metoprolol  tartrate, morphine injection, ondansetron, oxyCODONE, polyethylene glycol, RESOURCE THICKENUP CLEAR, sodium chloride, sodium chloride flush  Physical Exam  Constitutional:  Acutely ill-appearing older female with shortness of breath noted at rest  HENT:  Head: Normocephalic and atraumatic.  Cardiovascular:  Irregular, tachycardic  Pulmonary/Chest:  Increased work of breathing with conversation  Neurological: She is alert.  Short-term memory deficits noted.  Family reports she is been asking them where she is although time I see her she does recognize that  she is in the hospital  Skin: Skin is warm and dry. There is pallor.  Psychiatric:  Affect constricted  Nursing note and vitals reviewed.           Vital Signs: BP 101/60 (BP Location: Left Arm)   Pulse 84   Temp (!) 97.5 F (36.4 C) (Oral)   Resp 18   Ht _0  (1.651 m)   Wt 69.7 kg (153 lb 10.6 oz)   SpO2 99%   BMI 25.57 kg/m  SpO2: SpO2: 99 % O2 Device: O2 Device: Nasal Cannula O2 Flow Rate: O2 Flow Rate (L/min): 3 L/min  Intake/output summary:   Intake/Output Summary (Last 24 hours) at 07/27/2017 1656 Last data filed at 07/27/2017 3557 Gross per 24 hour  Intake 60 ml  Output 1125 ml  Net -1065 ml   LBM: Last BM Date: 07/26/17 Baseline Weight: Weight: 69.1 kg (152 lb 5.4 oz) Most recent weight: Weight: 69.7 kg (153 lb 10.6 oz)       Palliative Assessment/Data:    Flowsheet Rows     Most Recent Value  Intake Tab  Referral Department  Hospitalist  Unit at Time of Referral  Intermediate Care Unit  Palliative Care Primary Diagnosis  Cardiac  Date Notified  07/21/17  Palliative Care Type  New Palliative care  Reason for referral  Clarify Goals of Care  Date of Admission  07/12/17  Date first seen by Palliative Care  07/22/17  # of days Palliative referral response time  1 Day(s)  # of days IP prior to Palliative referral  9  Clinical Assessment  Palliative Performance Scale Score  30%  Pain Max last 24  hours  Not able to report  Pain Min Last 24 hours  Not able to report  Dyspnea Max Last 24 Hours  Not able to report  Dyspnea Min Last 24 hours  Not able to report  Nausea Max Last 24 Hours  Not able to report  Nausea Min Last 24 Hours  Not able to report  Anxiety Max Last 24 Hours  Not able to report  Anxiety Min Last 24 Hours  Not able to report  Other Max Last 24 Hours  Not able to report  Psychosocial & Spiritual Assessment  Palliative Care Outcomes  Patient/Family meeting held?  No  Palliative Care follow-up planned  Yes, Facility      Patient Active Problem List   Diagnosis Date Noted  . Poor appetite   . Shortness of breath   . Palliative care encounter   . Dysphagia   . Goals of care, counseling/discussion   . Palliative care by specialist   . Persistent atrial fibrillation ([place])   . Acute respiratory failure with hypoxia ([place])   . Malnutrition of moderate degree 07/17/2017  . Hypoxia   . Atelectasis   . Pleural effusion   . Tobacco abuse   . Lung nodule   . Thromboembolism ([place]) 07/12/2017  . 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 Adjustment disorder with mixed anxiety and depressed mood   . Left pontine cerebrovascular accident ([place]) 06/13/2017  . Benign essential HTN   . Diastolic dysfunction   . Hypokalemia   . Stroke (cerebrum) ([place]) 06/10/2017  . Acute lacunar stroke ([place]) 06/08/2017  . Diplopia 06/08/2017  . Hypertension   . Anxiety   . Seizures ([place])   . Encounter for screening colonoscopy 05/14/2012    Palliative Care Assessment & Plan   Patient Profile: 72  y.o.femalewith past medical history of hypertension, seizure disorder, recent CVA Dec 3845, diastolic heart failure grade 2, osteoporosis, alcohol use disorder, vascular dementia, thrombocytopenia, lung nodule, atrial fibadmitted on 1/10/2019with leg pain. Patient was taken to the OR emergently with extensive thrombectomy. On 07/13/2017 she underwent a  left AKA, femoral thrombectomy. On 07/19/2017 secondary to worsening dyspnea, underwent left thoracentesis for 550 cc. Hospitalization complicated by ongoing severe aspiration and respiratory decline along with very poor intake. Patient is refusing some aspects of treatment, including artificial feeding.  She is now out of stepdown unit and on the medical floor   Palliative care consult for goals of care  Assessment: Patient's husband is still deferring final decisions to patient's son, Gerald Stabs.  Gerald Stabs is not at the bedside presently but is expected over the weekend.  Discussed residential hospice with patient, her husband as well as brother; she would be open to this transfer if this was agreeable with her son and if she met criteria for admission  Recommendations/Plan:  Palliative medicine to discuss residential hospice further with patient's son Gerald Stabs this weekend  If he is in agreement, will place consult to social work to help facilitate transfer  No artificial feeding  Code Status:    Code Status Orders  (From admission, onward)        Start     Ordered   07/20/17 1620  Do not attempt resuscitation (DNR)  Continuous    Question Answer Comment  In the event of cardiac or respiratory ARREST Do not call a "code blue"   In the event of cardiac or respiratory ARREST Do not perform Intubation, CPR, defibrillation or ACLS   In the event of cardiac or respiratory ARREST Use medication by any route, position, wound care, and other measures to relive pain and suffering. May use oxygen, suction and manual treatment of airway obstruction as needed for comfort.      07/20/17 1620    Code Status History    Date Active Date Inactive Code Status Order ID Comments User Context   07/12/2017 21:14 07/20/2017 16:20 Full Code [id number]  Iline Oven Inpatient   06/13/2017 18:33 06/29/2017 14:22 Full Code [id number]  Cathlyn Parsons, PA-C Inpatient   06/13/2017 18:33 06/13/2017 18:33  Full Code [id number]  Cathlyn Parsons, PA-C Inpatient   06/09/2017 02:09 06/13/2017 18:23 Full Code [id number]  Truett Mainland, DO ED       Prognosis:  Potentially just weeks prognosis in the setting of severe vascular disease status post extensive thrombectomy, left AKA, congestive heart failure requiring thoracentesis for large left pleural effusion; patient continues to be symptomatic, dyspneic with conversation.  She is aspirating all consistencies, only taking sips of water at this point  Discharge Planning:  To Be Determined   Thank you for allowing the Palliative Medicine Team to assist in the care of this patient.   Time In: 1630 Time Out: 1705 Total Time 35 min Prolonged Time Billed  no       Greater than 50%  of this time was spent counseling and coordinating care related to the above assessment and plan.  Dory Horn, NP  Please contact Palliative Medicine Team phone at [phone] for questions and concerns.

## 2017-07-27 NOTE — Progress Notes (Signed)
Patient arrived to 6n25 alert not oriented, but per report that is baseline for her, VSS, IV intact and saline locked. Patient placed on 3 L o2 and hooked up purewick, comfortable at the moment, reports no pain. Family at bedside, will continue to monitor.

## 2017-07-27 NOTE — Progress Notes (Addendum)
Nutrition Follow Up/Brief Note  Chart reviewed. Pt with continued respiratory decline. Not eating. Refused NGT placement. Possibly home with Hospice. Palliative Medicine Team following.  Ensure Enlive supplement order remains in place. No further nutrition interventions at this time.  Please re-consult as needed.   Arthur Holms, RD, LDN Pager #: [phone] After-Hours Pager #: [phone]

## 2017-07-28 DIAGNOSIS — R1310 Dysphagia, unspecified: Secondary | ICD-10-CM

## 2017-07-28 LAB — CBC
HEMATOCRIT: 38.9 % (ref 36.0–46.0)
Hemoglobin: 12.3 g/dL (ref 12.0–15.0)
MCH: 29.4 pg (ref 26.0–34.0)
MCHC: 31.6 g/dL (ref 30.0–36.0)
MCV: 92.8 fL (ref 78.0–100.0)
Platelets: 294 10*3/uL (ref 150–400)
RBC: 4.19 MIL/uL (ref 3.87–5.11)
RDW: 20.5 % — AB (ref 11.5–15.5)
WBC: 8.6 10*3/uL (ref 4.0–10.5)

## 2017-07-28 LAB — BASIC METABOLIC PANEL
ANION GAP: 13 (ref 5–15)
BUN: 16 mg/dL (ref 6–20)
CALCIUM: 8.9 mg/dL (ref 8.9–10.3)
CO2: 21 mmol/L — AB (ref 22–32)
CREATININE: 0.64 mg/dL (ref 0.44–1.00)
Chloride: 106 mmol/L (ref 101–111)
Glucose, Bld: 107 mg/dL — ABNORMAL HIGH (ref 65–99)
Potassium: 4.1 mmol/L (ref 3.5–5.1)
SODIUM: 140 mmol/L (ref 135–145)

## 2017-07-28 NOTE — Progress Notes (Signed)
[facility] PROGRESS NOTE  Shelley [name] NWG:[id number] DOB: 1946-06-17 DOA: 07/12/2017  PCP: [name], MD  Brief History/Interval Summary: 72 year old female with a past medical history of alcohol abuse, anxiety disorder, stroke, seizures, hypertension, dementia who initially presented with left foot pain.  This was secondary to acute ischemia.  She was taken to the operating room emergently and underwent thrombectomy.  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  Since then patient has slowly declined.  Palliative medicine was consulted.   Consultants:  [facility] Cardiology Palliative Care   Antimicrobials:  Cefepime 1/14> 1/18 Vancomycin 1/14> 1/18  Procedures:  1/10 admit w/ L leg pain - taken to OR emergently w/ extensive thrombectomy - L IJ CVL 1/11 L AKA and L common femoral thrombectomy  1/17 L thoracentesis in IR > 538ml - transudate    Subjective/Interval History: Patient denies any complaints.  States that she does not have an appetite.  Does not want to eat or drink.    Objective:  Vital Signs  Vitals:   07/27/17 1151 07/27/17 2100 07/28/17 0517 07/28/17 0833  BP: 101/60 119/63 114/63 114/76  Pulse: 84 100 (!) 109 97  Resp: 18 18 17  (!) 24  Temp: (!) 97.5 F (36.4 C) 98 F (36.7 C) 98 F (36.7 C) (!) 97.4 F (36.3 C)  TempSrc: Oral Oral Oral Axillary  SpO2: 99% 95% 94% 91%  Weight:      Height:        Intake/Output Summary (Last 24 hours) at 07/28/2017 1119 Last data filed at 07/28/2017 0533 Gross per 24 hour  Intake 10 ml  Output 250 ml  Net -240 ml   Filed Weights   07/13/17 1741 07/14/17 0545 07/22/17 0447  Weight: 69.1 kg (152 lb 5.4 oz) 67.3 kg (148 lb 5.9 oz) 69.7 kg (153 lb 10.6 oz)    General appearance: Awake alert.  In no distress. Resp: Diminished air entry without any wheezing rales or rhonchi. Cardio: S1-S2 is irregularly irregular.  Normal rate. GI: Soft.  Nontender nondistended. Extremities: Left AKA. Neurologic:  Distracted.  No obvious focal neurological deficits.  Lab Results:  Data Reviewed: I have personally reviewed following labs and imaging studies  CBC: Recent Labs  Lab 07/22/17 0522 07/23/17 0619 07/28/17 0343  WBC 14.0* 12.7* 8.6  HGB 10.2* 10.5* 12.3  HCT 31.9* 33.2* 38.9  MCV 90.1 91.0 92.8  PLT 208 232 578    Basic Metabolic Panel: Recent Labs  Lab 07/22/17 0522 07/23/17 0619 07/28/17 0343  NA 143 144 140  K 4.0 4.3 4.1  CL 106 111 106  CO2 26 24 21*  GLUCOSE 111* 122* 107*  BUN 20 14 16   CREATININE 0.52 0.47 0.64  CALCIUM 8.3* 8.7* 8.9    GFR: Estimated Creatinine Clearance: 61.4 mL/min (by C-G formula based on SCr of 0.64 mg/dL).  Liver Function Tests: Recent Labs  Lab 07/23/17 0619  AST 24  ALT 22  ALKPHOS 145*  BILITOT 1.1  PROT 5.1*  ALBUMIN 2.5*     Recent Results (from the past 240 hour(s))  Gram stain     Status: None   Collection Time: 07/19/17  4:17 PM  Result Value Ref Range Status   Specimen Description PLEURAL  Final   Special Requests NONE  Final   Gram Stain   Final    CYTOSPIN SMEAR WBC PRESENT, PREDOMINANTLY PMN NO ORGANISMS SEEN    Report Status 07/19/2017 FINAL  Final  Culture, body fluid-bottle  Status: None   Collection Time: 07/19/17  4:17 PM  Result Value Ref Range Status   Specimen Description PLEURAL  Final   Special Requests NONE  Final   Culture NO GROWTH 5 DAYS  Final   Report Status 07/24/2017 FINAL  Final  MRSA PCR Screening     Status: None   Collection Time: 07/21/17  7:21 PM  Result Value Ref Range Status   MRSA by PCR NEGATIVE NEGATIVE Final    Comment:        The GeneXpert MRSA Assay (FDA approved for NASAL specimens only), is one component of a comprehensive MRSA colonization surveillance program. It is not intended to diagnose MRSA infection nor to guide or monitor treatment for MRSA infections.       Radiology Studies: No results found.   Medications:  Scheduled: . chlorhexidine   15 mL Mouth Rinse BID  . Chlorhexidine Gluconate Cloth  6 each Topical Daily  . digoxin  0.1875 mg Oral Daily  . diltiazem  300 mg Oral Daily  . levETIRAcetam  500 mg Oral BID  . mouth rinse  15 mL Mouth Rinse q12n4p  . sodium chloride flush  10-40 mL Intracatheter Q12H  . 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  They have had discussions with patient and family.  Patient appears to have stabilized over the last 3 days.  She continues to have poor oral intake and is considered high aspiration risk.  As a result of that she is thought to have poor prognosis.  It appears that the plan is for hospice but disposition is not entirely clear yet.  Patient's son is supposed to come over this weekend.  He is supposed to make final decision.  L pleural effusion - Aspiration pneumonitis/pneumonia - acute hypoxic respiratory failure- recurring aspiration This was progressively worsening.  Patient has recurrent aspiration.  Palliative care is following.  She is off of antibiotics currently.    Dysphagia - recurring aspiration Pt has refused MBS or FEES -  appears to be aspirating all consistencies tried at bedside - there is no "safe" diet to allow her at this time, but she will not tolerate/allow an NG feeding tube.  Situation was discussed with her husband and it was felt that she be allowed to eat accepting the risk of aspiration. Her dementia appears to have progressed to the early end-stages such that her ability to safely maintain nourishment is markedly impaired.  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  Appears to be stable from this standpoint   Persistent atrial fibrillation Heart rate has been suboptimally controlled.  Patient often refusing meds. Heparin gtt stopped due to acute worsening of anemia (>2g  Hgb drop).  Following.  Heart rate is better controlled now.  She is currently on diltiazem and digoxin.  Blood pressure noted to be somewhat low this morning.  She is asymptomatic.  Continue to monitor.  Cardiology is recommending consideration of oral anticoagulation.  This will somewhat depend on goals of care conversations.  If plan is for hospice then would not initiate anticoagulation.  Anemia of unclear etiology  No signs of overt bleeding. LDH and Haptoglobin not c/w hemolysis - responded better than expected to 2U PRBC - CT abd w/o evidence of acute bleed.  Hemoglobin has been stable.  No further workup at this time.  Altered mental status - vascular dementia - early end-stage dementia/Hx CVA Patient has been uncooperative.  CT head did not show any acute process but did reveal severe vascular ischemic changes suggesting vascular dementia.    Chronic grade 2 diastolic CHF (per TTE Dec 2018) Appears euvolemic at present.  Hypotension  Resolved w/ simple volume expansion   RUL nodular densities CT angio noted spiculated mass 2 - PET recommended as outpt. Given progressing dementia and issues discussed above further evaluation would not be appropriate or helpful.  Thrombocytopenia  (baseline~180) Likely due to EtOH abuse   EtOH abuse Stable  Moderate malnutrition in context of acute illness/injury Will likely be exacerbated due to poor oral intake.  DVT Prophylaxis: SCD's    Code Status: DNR Family Communication: No family at bedside Disposition Plan: Final disposition is not clear yet.    LOS: 16 days   [facility] Pager [phone] 07/28/2017, 11:19 AM  If 7PM-7AM, please contact night-coverage at [email or web address], password [facility]

## 2017-07-28 NOTE — Social Work (Addendum)
CSW acknowledging consult for residential hospice placement- pt family states that they would prefer placement in [place] at [facility].   CSW has called in referral to [facility] [place].  Awaiting call back from [facility] admissions liaison Horris Latino.   4:41pm: Horris Latino is actually the home with hospice liaison and called CSW back, she mentioned that [facility] is currently full and does not have any beds but requested CSW call liaison Robin/Lana at [phone] to make referral for possible bed tomorrow.   4:45pm- Robin, admissions liaison for [facility] confirmed that there are no beds available for transfer today. They asked CSW to fax clinicals to [phone].  5:00pm- CSW faxed clinicals to [place] to follow up in the morning regarding bed availability.   Alexander Mt, [place] Work [phone]

## 2017-07-28 NOTE — Progress Notes (Signed)
Daily Progress Note   Patient Name: Shelley [name]       Date: 07/28/2017 DOB: 03-Mar-1946  Age: 72 y.o. MRN#: [id number] Attending Physician: Bonnielee Haff, MD Primary Care Physician: [name], MD Admit Date: 07/12/2017  Reason for Consultation/Follow-up: Establishing goals of care, Inpatient hospice referral, Non pain symptom management, Pain control and Psychosocial/spiritual support  Subjective: Chart reviewed, met with patient, patient's husband, and spoke to patient's son, Gerald Stabs, via phone.  Patient shares with me again that she feels very badly and just wants to get out of the hospital.  I had discussed the possibility of residential hospice with patient, patient's husband Legrand Como, as well as patient's brother on 07/27/2017.  Patient continues to not eat or drink except sips of water and is aspirating that  Length of Stay: 16  Current Medications: Scheduled Meds:  . chlorhexidine  15 mL Mouth Rinse BID  . Chlorhexidine Gluconate Cloth  6 each Topical Daily  . digoxin  0.1875 mg Oral Daily  . diltiazem  300 mg Oral Daily  . levETIRAcetam  500 mg Oral BID  . mouth rinse  15 mL Mouth Rinse q12n4p  . sodium chloride flush  10-40 mL Intracatheter Q12H  . thiamine  100 mg Oral Daily    Continuous Infusions:   PRN Meds: acetaminophen **OR** acetaminophen, alum & mag hydroxide-simeth, feeding supplement (ENSURE ENLIVE), haloperidol lactate, levalbuterol, LORazepam, metoprolol tartrate, morphine injection, ondansetron, oxyCODONE, polyethylene glycol, RESOURCE THICKENUP CLEAR, sodium chloride, sodium chloride flush  Physical Exam  Constitutional:  Frail older female; she appears ill, weak and short of breath at rest  HENT:  Head: Normocephalic and atraumatic.  Cardiovascular:    Irregular  Pulmonary/Chest:  Mild increased work of breathing at rest as well as with conversation  Neurological:  Somnolent Answer simple questions, yes or no but otherwise is not is conversant as when I have met with her on other occasions  Skin: Skin is warm and dry. There is pallor.  Psychiatric:  Withdrawn, no agitation  Nursing note and vitals reviewed.           Vital Signs: BP 119/73 (BP Location: Left Arm)   Pulse 88   Temp 97.8 F (36.6 C) (Oral)   Resp (!) 24   Ht _0  (1.651 m)   Wt 69.7 kg (153 lb  10.6 oz)   SpO2 97%   BMI 25.57 kg/m  SpO2: SpO2: 97 % O2 Device: O2 Device: Nasal Cannula O2 Flow Rate: O2 Flow Rate (L/min): 3 L/min  Intake/output summary:   Intake/Output Summary (Last 24 hours) at 07/28/2017 1700 Last data filed at 07/28/2017 0533 Gross per 24 hour  Intake 10 ml  Output 250 ml  Net -240 ml   LBM: Last BM Date: (Pt. stated several days ago) Baseline Weight: Weight: 69.1 kg (152 lb 5.4 oz) Most recent weight: Weight: 69.7 kg (153 lb 10.6 oz)       Palliative Assessment/Data:    Flowsheet Rows     Most Recent Value  Intake Tab  Referral Department  Hospitalist  Unit at Time of Referral  Intermediate Care Unit  Palliative Care Primary Diagnosis  Cardiac  Date Notified  07/21/17  Palliative Care Type  New Palliative care  Reason for referral  Clarify Goals of Care  Date of Admission  07/12/17  Date first seen by Palliative Care  07/22/17  # of days Palliative referral response time  1 Day(s)  # of days IP prior to Palliative referral  9  Clinical Assessment  Palliative Performance Scale Score  30%  Pain Max last 24 hours  Not able to report  Pain Min Last 24 hours  Not able to report  Dyspnea Max Last 24 Hours  Not able to report  Dyspnea Min Last 24 hours  Not able to report  Nausea Max Last 24 Hours  Not able to report  Nausea Min Last 24 Hours  Not able to report  Anxiety Max Last 24 Hours  Not able to report  Anxiety Min  Last 24 Hours  Not able to report  Other Max Last 24 Hours  Not able to report  Psychosocial & Spiritual Assessment  Palliative Care Outcomes  Patient/Family meeting held?  No  Palliative Care follow-up planned  Yes, Facility      Patient Active Problem List   Diagnosis Date Noted  . Poor appetite   . Shortness of breath   . Palliative care encounter   . Dysphagia   . Goals of care, counseling/discussion   . Palliative care by specialist   . Persistent atrial fibrillation ([place])   . Acute respiratory failure with hypoxia ([place])   . Malnutrition of moderate degree 07/17/2017  . Hypoxia   . Atelectasis   . Pleural effusion   . Tobacco abuse   . Lung nodule   . Thromboembolism ([place]) 07/12/2017  . 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 Adjustment disorder with mixed anxiety and depressed mood   . Left pontine cerebrovascular accident ([place]) 06/13/2017  . Benign essential HTN   . Diastolic dysfunction   . Hypokalemia   . Stroke (cerebrum) ([place]) 06/10/2017  . Acute lacunar stroke ([place]) 06/08/2017  . Diplopia 06/08/2017  . Hypertension   . Anxiety   . Seizures ([place])   . Encounter for screening colonoscopy 05/14/2012    Palliative Care Assessment & Plan   Patient Profile: 72 y.o.femalewith past medical history of hypertension, seizure disorder, recent CVA Dec 0370, diastolic heart failure grade 2, osteoporosis, alcohol use disorder, vascular dementia, thrombocytopenia, lung nodule, atrial fibadmitted on 1/10/2019with leg pain. Patient was taken to the OR emergently with extensive thrombectomy. On 07/13/2017 she underwent a left AKA, femoral thrombectomy. On 07/19/2017 secondary to worsening dyspnea, underwent left thoracentesis for 550 cc. Hospitalization complicated by ongoing severe aspiration and respiratory  decline along with very poor intake. Patient is refusing some aspects of treatment, including artificial feeding.  She is now out of  stepdown unit and on the medical floor   Palliative care consult for goals of care   Assessment: Spoke to patient's son Gerald Stabs via phone about potential transfer to residential hospice in lieu of patient's goals to be with family.  He also verbalizes that his mother has had poor quality of life for a number of years prior to this hospitalization.  He states she has been verbalizing being ready to die for a number of years.  He to recognizes that she would not be a good rehab candidate not only from a psychological standpoint but from a physical standpoint as well secondary to her dyspnea. He is in favor of residential hospice in [facility] for end-of-life care  Recommendations/Plan:  Order placed to social work for residential hospice.  Offered choice per Medicare guidelines.  Family elects residential hospice in [place] and Additional Recommendations:  Limitations on Scope of Treatment: Avoid Hospitalization, Minimize Medications, Initiate Comfort Feeding, No Artificial Feeding, No Blood Transfusions, No Chemotherapy, No Diagnostics, No Hemodialysis, No IV Antibiotics, No Radiation, No Surgical Procedures and No Tracheostomy  Code Status:    Code Status Orders  (From admission, onward)        Start     Ordered   07/20/17 1620  Do not attempt resuscitation (DNR)  Continuous    Question Answer Comment  In the event of cardiac or respiratory ARREST Do not call a "code blue"   In the event of cardiac or respiratory ARREST Do not perform Intubation, CPR, defibrillation or ACLS   In the event of cardiac or respiratory ARREST Use medication by any route, position, wound care, and other measures to relive pain and suffering. May use oxygen, suction and manual treatment of airway obstruction as needed for comfort.      07/20/17 1620    Code Status History    Date Active Date Inactive Code Status Order ID Comments User Context   07/12/2017 21:14 07/20/2017  16:20 Full Code [id number]  Iline Oven Inpatient   06/13/2017 18:33 06/29/2017 14:22 Full Code [id number]  Cathlyn Parsons, PA-C Inpatient   06/13/2017 18:33 06/13/2017 18:33 Full Code [id number]  Cathlyn Parsons, PA-C Inpatient   06/09/2017 02:09 06/13/2017 18:23 Full Code [id number]  Truett Mainland, DO ED       Prognosis:   < 4 weeks in the setting of severe vascular disease, status post extensive thrombectomy with left BKA, dysphasia, severe aspiration, symptomatic congestive heart failure with dyspnea; no longer eating or drinking  Discharge Planning:  Hospice facility    Thank you for allowing the Palliative Medicine Team to assist in the care of this patient.   Time In: 1600 Time Out: 1635 Total Time 35 min Prolonged Time Billed  no       Greater than 50%  of this time was spent counseling and coordinating care related to the above assessment and plan.  Dory Horn, NP  Please contact Palliative Medicine Team phone at [phone] for questions and concerns.

## 2017-07-29 MED ORDER — TRAZODONE HCL 50 MG PO TABS
50.0000 mg | ORAL_TABLET | Freq: Every day | ORAL | Status: DC
  Administered 2017-07-29 – 2017-07-30 (×2): 50 mg via ORAL
  Filled 2017-07-29 (×2): qty 1

## 2017-07-29 NOTE — Progress Notes (Signed)
[facility] PROGRESS NOTE  Shelley [name] FGH:[id number] DOB: Nov 23, 1945 DOA: 07/12/2017  PCP: [name], MD  Brief History/Interval Summary: 72 year old female with a past medical history of alcohol abuse, anxiety disorder, stroke, seizures, hypertension, dementia who initially presented with left foot pain.  This was secondary to acute ischemia.  She was taken to the operating room emergently and underwent thrombectomy.  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  Since then patient has slowly declined.  Palliative medicine was consulted.   Consultants:  [facility] PCCM Cardiology Palliative Care   Antimicrobials:  Cefepime 1/14> 1/18 Vancomycin 1/14> 1/18  Procedures:  1/10 admit w/ L leg pain - taken to OR emergently w/ extensive thrombectomy - L IJ CVL 1/11 L AKA and L common femoral thrombectomy  1/17 L thoracentesis in IR > 553ml - transudate    Subjective/Interval History: Patient states that she could not sleep well last night.  Her husband is at the bedside.  Denies any other complaints at this time.    Objective:  Vital Signs  Vitals:   07/28/17 2100 07/29/17 0500 07/29/17 0757 07/29/17 1130  BP: 107/61 (!) 99/59 101/63 97/74  Pulse: 83 81 97 92  Resp: (!) 24 (!) 24 (!) 24 (!) 24  Temp: 97.7 F (36.5 C) 97.9 F (36.6 C) 97.6 F (36.4 C)   TempSrc: Oral Oral Oral   SpO2: 95% 93% 96% 97%  Weight:      Height:        Intake/Output Summary (Last 24 hours) at 07/29/2017 1149 Last data filed at 07/29/2017 1015 Gross per 24 hour  Intake 140 ml  Output 450 ml  Net -310 ml   Filed Weights   07/13/17 1741 07/14/17 0545 07/22/17 0447  Weight: 69.1 kg (152 lb 5.4 oz) 67.3 kg (148 lb 5.9 oz) 69.7 kg (153 lb 10.6 oz)    General appearance: Awake alert.  In no distress. Resp: Diminished air entry without any wheezing rales or rhonchi. Cardio: S1-S2 is irregularly irregular.  Normal rate. GI: Abdomen is soft.  Nontender nondistended. Extremities: Left  AKA. Neurologic: Distracted.  No obvious focal neurological deficits.  Lab Results:  Data Reviewed: I have personally reviewed following labs and imaging studies  CBC: Recent Labs  Lab 07/23/17 0619 07/28/17 0343  WBC 12.7* 8.6  HGB 10.5* 12.3  HCT 33.2* 38.9  MCV 91.0 92.8  PLT 232 678    Basic Metabolic Panel: Recent Labs  Lab 07/23/17 0619 07/28/17 0343  NA 144 140  K 4.3 4.1  CL 111 106  CO2 24 21*  GLUCOSE 122* 107*  BUN 14 16  CREATININE 0.47 0.64  CALCIUM 8.7* 8.9    GFR: Estimated Creatinine Clearance: 61.4 mL/min (by C-G formula based on SCr of 0.64 mg/dL).  Liver Function Tests: Recent Labs  Lab 07/23/17 0619  AST 24  ALT 22  ALKPHOS 145*  BILITOT 1.1  PROT 5.1*  ALBUMIN 2.5*     Recent Results (from the past 240 hour(s))  Gram stain     Status: None   Collection Time: 07/19/17  4:17 PM  Result Value Ref Range Status   Specimen Description PLEURAL  Final   Special Requests NONE  Final   Gram Stain   Final    CYTOSPIN SMEAR WBC PRESENT, PREDOMINANTLY PMN NO ORGANISMS SEEN    Report Status 07/19/2017 FINAL  Final  Culture, body fluid-bottle     Status: None   Collection Time: 07/19/17  4:17 PM  Result  Value Ref Range Status   Specimen Description PLEURAL  Final   Special Requests NONE  Final   Culture NO GROWTH 5 DAYS  Final   Report Status 07/24/2017 FINAL  Final  MRSA PCR Screening     Status: None   Collection Time: 07/21/17  7:21 PM  Result Value Ref Range Status   MRSA by PCR NEGATIVE NEGATIVE Final    Comment:        The GeneXpert MRSA Assay (FDA approved for NASAL specimens only), is one component of a comprehensive MRSA colonization surveillance program. It is not intended to diagnose MRSA infection nor to guide or monitor treatment for MRSA infections.       Radiology Studies: No results found.   Medications:  Scheduled: . chlorhexidine  15 mL Mouth Rinse BID  . Chlorhexidine Gluconate Cloth  6 each Topical  Daily  . digoxin  0.1875 mg Oral Daily  . diltiazem  300 mg Oral Daily  . levETIRAcetam  500 mg Oral BID  . mouth rinse  15 mL Mouth Rinse q12n4p  . sodium chloride flush  10-40 mL Intracatheter Q12H  . thiamine  100 mg Oral Daily  . 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  They have had discussions with patient and family.  Appreciate their assistance.  Patient continues to have poor oral intake and is considered high aspiration risk.  As a result of that she is thought to have poor prognosis.  Plan is for her to go to residential hospice.   L pleural effusion - Aspiration pneumonitis/pneumonia - acute hypoxic respiratory failure- recurring aspiration This was progressively worsening.  Patient has recurrent aspiration.  Palliative care is following.  She is off of antibiotics currently.    Dysphagia - recurring aspiration Pt has refused MBS or FEES - appears to be aspirating all consistencies tried at bedside - there is no "safe" diet to allow her at this time, but she will not tolerate/allow an NG feeding tube.  Situation was discussed with her  husband and it was felt that she be allowed to eat accepting the risk of aspiration. Her dementia appears to have progressed to the early end-stages such that her ability to safely maintain nourishment is markedly impaired.  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  Appears to be stable from this standpoint   Persistent atrial fibrillation Heart rate has been suboptimally controlled.  Patient often refusing meds. Heparin gtt stopped due to acute worsening of anemia (>2g Hgb drop).  Following.  Heart rate is better controlled now.  She is currently on diltiazem and digoxin.  Blood pressure noted to be somewhat low this morning.  She is asymptomatic.  Continue to monitor.  Cardiology is recommending consideration of oral anticoagulation.  Since plan is for the patient to go to residential hospice and her prognosis is poor we will hold off on anticoagulation for now.   Anemia of unclear etiology  No signs of overt bleeding. LDH and Haptoglobin not c/w hemolysis - responded better than expected to 2U PRBC - CT abd w/o evidence of acute bleed.  Hemoglobin has been stable.  No further workup at this time.  Altered mental status - vascular dementia - early end-stage dementia/Hx CVA Patient has been uncooperative.  CT head did not show any acute process but did reveal severe vascular ischemic changes suggesting vascular dementia.    Chronic grade 2 diastolic CHF (per TTE Dec 2018) Appears euvolemic at present.  Hypotension  Resolved w/ simple volume expansion   RUL nodular densities CT angio noted spiculated mass 2 - PET recommended as outpt.  No further workup since she is a hospice candidate.  Thrombocytopenia (baseline~180) Likely due to EtOH abuse   EtOH abuse Stable  Moderate malnutrition in context of acute illness/injury   DVT Prophylaxis: SCD's    Code Status: DNR Family Communication: Discussed with patient and her husband Disposition Plan:  Management as outlined above.    LOS: 17 days   [place] [facility] Pager [phone] 07/29/2017, 11:49 AM  If 7PM-7AM, please contact night-coverage at [email or web address], password [facility]

## 2017-07-30 LAB — DIGOXIN LEVEL: DIGOXIN LVL: 1.1 ng/mL (ref 0.8–2.0)

## 2017-07-30 NOTE — Care Management Important Message (Signed)
Important Message  Patient Details  Name: Shelley [name] MRN: [id number] Date of Birth: 1945/09/09   Medicare Important Message Given:  Yes    [name] 07/30/2017, 12:32 PM

## 2017-07-30 NOTE — Clinical Social Work Note (Signed)
CSW talked with Jinny Blossom with [facility] regarding patient and was informed that the medical director reviewed Shelley [name]'s clinicals and she is appropriate for their hospice home when a bed becomes available.  CSW talked with husband earlier today by phone and was informed that he would be at the hospital later this afternoon. CSW talked with husband outside of the room at approx. 3:45 pm and he was informed that [facility] does not have a bed and alternate plans were discussed. Mr. Mulvaney would like to take his wife home and wait for a bed at [facility], and indicated that he has family (his daughter and granddaughter) who are willing to assist in her care until she can go to the hospice facility, and this was discussed.  Mr. Vanderlinden advised that the unit SW will be updated on the plan.  Call made to [facility] and recapped my conversation with Mr. Strehl and him wanting to take his wife home with Hospice services if they don't have a bed tomorrow. Jinny Blossom indicated that she will contact Mr. Yazdi to determine what DME his wife will need. Unit CSW will continue to follow and facilitate discharge to hospice facility if a bed becomes available or home via PTAR with hospice services at home.  Lulie Hurd Givens, MSW, LCSW Licensed Clinical Social Worker [place] [phone]

## 2017-07-30 NOTE — Progress Notes (Signed)
[facility] PROGRESS NOTE  THETIS SCHWIMMER [id number] DOB: Mar 18, 1946 DOA: 07/12/2017  PCP: [name], MD  Brief History/Interval Summary: 72 year old female with a past medical history of alcohol abuse, anxiety disorder, stroke, seizures, hypertension, dementia who initially presented with left foot pain.  This was secondary to acute ischemia.  She was taken to the operating room emergently and underwent thrombectomy.  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  Since then patient has slowly declined.  Palliative medicine was consulted.  Await bed at inpatient/residential hospice.  Consultants:  [facility] PCCM Cardiology Palliative Care   Antimicrobials:  Cefepime 1/14> 1/18 Vancomycin 1/14> 1/18  Procedures:  1/10 admit w/ L leg pain - taken to OR emergently w/ extensive thrombectomy - L IJ CVL 1/11 L AKA and L common femoral thrombectomy  1/17 L thoracentesis in IR > 540ml - transudate    Subjective/Interval History: Patient states that she slept better last night.  Continues to have poor appetite.  Denies any pain.    Objective:  Vital Signs  Vitals:   07/29/17 0757 07/29/17 1130 07/29/17 2100 07/30/17 0618  BP: 101/63 97/74 108/68 (!) 114/96  Pulse: 97 92 91 71  Resp: (!) 24 (!) 24 (!) 24 (!) 24  Temp: 97.6 F (36.4 C)  97.8 F (36.6 C) (!) 97.5 F (36.4 C)  TempSrc: Oral  Oral Oral  SpO2: 96% 97% 97% 97%  Weight:      Height:        Intake/Output Summary (Last 24 hours) at 07/30/2017 1256 Last data filed at 07/30/2017 1035 Gross per 24 hour  Intake 156 ml  Output 250 ml  Net -94 ml   Filed Weights   07/13/17 1741 07/14/17 0545 07/22/17 0447  Weight: 69.1 kg (152 lb 5.4 oz) 67.3 kg (148 lb 5.9 oz) 69.7 kg (153 lb 10.6 oz)    General appearance: Awake alert.  In no distress. Resp: Diminished air entry without any wheezing rales or rhonchi Cardio: S1-S2 is irregularly irregular with normal rate GI: Abdomen soft.  Nontender  nondistended Extremities: Left AKA. Neurologic: Distracted.  No obvious focal neurological deficits.  Lab Results:  Data Reviewed: I have personally reviewed following labs and imaging studies  CBC: Recent Labs  Lab 07/28/17 0343  WBC 8.6  HGB 12.3  HCT 38.9  MCV 92.8  PLT 536    Basic Metabolic Panel: Recent Labs  Lab 07/28/17 0343  NA 140  K 4.1  CL 106  CO2 21*  GLUCOSE 107*  BUN 16  CREATININE 0.64  CALCIUM 8.9    GFR: Estimated Creatinine Clearance: 61.4 mL/min (by C-G formula based on SCr of 0.64 mg/dL).   Recent Results (from the past 240 hour(s))  MRSA PCR Screening     Status: None   Collection Time: 07/21/17  7:21 PM  Result Value Ref Range Status   MRSA by PCR NEGATIVE NEGATIVE Final    Comment:        The GeneXpert MRSA Assay (FDA approved for NASAL specimens only), is one component of a comprehensive MRSA colonization surveillance program. It is not intended to diagnose MRSA infection nor to guide or monitor treatment for MRSA infections.       Radiology Studies: No results found.   Medications:  Scheduled: . chlorhexidine  15 mL Mouth Rinse BID  . Chlorhexidine Gluconate Cloth  6 each Topical Daily  . digoxin  0.1875 mg Oral Daily  . diltiazem  300 mg Oral Daily  . levETIRAcetam  500 mg Oral BID  . mouth rinse  15 mL Mouth Rinse q12n4p  . sodium chloride flush  10-40 mL Intracatheter Q12H  . thiamine  100 mg Oral Daily  . 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  They have had discussions with patient and family.  Appreciate their assistance.  Patient continues to have poor oral intake and is considered high aspiration risk.  As a result of that she is thought to have poor prognosis.  Plan is for her to go to residential hospice.  Await bed availability.  L pleural effusion - Aspiration pneumonitis/pneumonia - acute hypoxic respiratory failure- recurring aspiration This was progressively worsening.  Patient has recurrent aspiration.  Palliative care is following.  She is off of antibiotics currently.    Dysphagia - recurring aspiration Pt has refused MBS or FEES - appears to be aspirating all consistencies tried at bedside - there is no "safe" diet to allow her at this time, but she will not tolerate/allow an NG feeding tube.  Situation was discussed with her husband and it was felt that she be allowed to eat accepting the risk of aspiration. Her dementia appears to have progressed to the early end-stages such that her ability to safely maintain nourishment is markedly impaired.  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  Appears to be stable from this standpoint   Persistent atrial fibrillation Heart rate has been suboptimally controlled.  Patient often refusing meds. Heparin gtt stopped due to acute worsening of anemia (>2g Hgb drop).  Following.  Heart rate is better controlled now.  She is currently on diltiazem and digoxin.  Blood pressure noted to be somewhat low this morning.  She is asymptomatic.  Continue to monitor.  Cardiology is  recommending consideration of oral anticoagulation.  Since plan is for the patient to go to residential hospice and her prognosis is poor we will hold off on anticoagulation for now.   Anemia of unclear etiology  No signs of overt bleeding. LDH and Haptoglobin not c/w hemolysis - responded better than expected to 2U PRBC - CT abd w/o evidence of acute bleed.  Hemoglobin has been stable.  No further workup at this time.  Altered mental status - vascular dementia - early end-stage dementia/Hx CVA Patient has been uncooperative.  CT head did not show any acute process but did reveal severe vascular ischemic changes suggesting vascular dementia.    Chronic grade 2 diastolic CHF (per TTE Dec 2018) Appears euvolemic at present.  Hypotension  Resolved w/ simple volume expansion   RUL nodular densities CT angio noted spiculated mass 2 - PET recommended as outpt.  No further workup since she is a hospice  candidate.  Thrombocytopenia (baseline~180) Likely due to EtOH abuse   EtOH abuse Stable  Moderate malnutrition in context of acute illness/injury   DVT Prophylaxis: SCD's    Code Status: DNR Family Communication: No family at bedside today. Disposition Plan: Management as outlined above.    LOS: 18 days   [place] [facility] Pager [phone] 07/30/2017, 12:56 PM  If 7PM-7AM, please contact night-coverage at [email or web address], password [facility]

## 2017-07-30 NOTE — Progress Notes (Signed)
Patient lost IV access, asked MD [place] Pink if we needed to get a new IV on her, he said didn't need to place another. Will continue to monitor.

## 2017-07-30 NOTE — Progress Notes (Signed)
Palliative Medicine RN Note: Symptom check. Pt is awake and alert. She is very confused, and she was about to drink her denture cleaner when I entered. I moved her denture cup, and she said she would like to eat. However, she refused to drink anything and refused to let me help her. She has not been eating, so this is not unexpected. She had also refused to let the NTs help her eat.  She denies discomfort at this time. Disposition is pending with SW & hospice placement. I will follow up tomorrow to check on her symptoms and progression to inpatient hospice.  Marjie Skiff Benny Deutschman, RN, BSN, [facility] Palliative Medicine Team 07/30/2017 11:27 AM Office [phone]

## 2017-07-31 MED ORDER — DIGOXIN 187.5 MCG PO TABS: 0.1875 mg | ORAL_TABLET | Freq: Every day | ORAL | 0 refills | Status: AC

## 2017-07-31 MED ORDER — MORPHINE SULFATE (CONCENTRATE) 10 MG/0.5ML PO SOLN: 5.0000 mg | ORAL | Status: DC | PRN

## 2017-07-31 MED ORDER — POLYETHYLENE GLYCOL 3350 17 G PO PACK: 17.0000 g | PACK | Freq: Every day | ORAL | 0 refills | Status: AC

## 2017-07-31 MED ORDER — OXYCODONE HCL 5 MG PO TABS: 5.0000 mg | ORAL_TABLET | Freq: Four times a day (QID) | ORAL | 0 refills | Status: DC | PRN

## 2017-07-31 MED ORDER — LORAZEPAM 2 MG/ML PO CONC: 1.0000 mg | ORAL | Status: DC | PRN

## 2017-07-31 MED ORDER — MORPHINE SULFATE (CONCENTRATE) 10 MG/0.5ML PO SOLN: 5.0000 mg | ORAL | 0 refills | Status: AC | PRN

## 2017-07-31 MED ORDER — DILTIAZEM HCL ER COATED BEADS 300 MG PO CP24: 300.0000 mg | ORAL_CAPSULE | Freq: Every day | ORAL | 0 refills | Status: AC

## 2017-07-31 MED ORDER — TRAZODONE HCL 50 MG PO TABS: 50.0000 mg | ORAL_TABLET | Freq: Every day | ORAL | 0 refills | Status: AC

## 2017-07-31 MED ORDER — LORAZEPAM 2 MG/ML PO CONC: 1.0000 mg | ORAL | 0 refills | Status: AC | PRN

## 2017-07-31 MED ORDER — LEVETIRACETAM 500 MG PO TABS: 500.0000 mg | ORAL_TABLET | Freq: Two times a day (BID) | ORAL | 0 refills | Status: AC

## 2017-07-31 NOTE — Care Management Note (Signed)
Case Management Note  Patient Details  Name: ANIKO FINNIGAN MRN: [id number] Date of Birth: 06/27/1946  Subjective/Objective:                    Action/Plan:  Spoke with Olean Ree at [facility] [place]. They do not have a residental hospice bed today and plan now is family will take patient home with hospice. Olean Ree has ordered hospital bed and home oxygen, delivery time not known.   Spoke with patient's husband Shenia Alan [phone] and confirmed above plan. Mr Dagley is at home getting ready to have DME delivered. He has not received a call yet with estimated time of delivery. Once Mr Leana Roe has the hospital bed and oxygen in his home he will call NCM ( he has direct number) . Mr Weems would like patient transported home via [place].   Confirmed address with Mr Kouns:   [address], [place], [place] [address]  Expected Discharge Date:                  Expected Discharge Plan:  Home w Hospice Care  In-House Referral:  Clinical Social Work  Discharge planning Services  CM Consult  Post Acute Care Choice:    Choice offered to:  Spouse  DME Arranged:  N/A DME Agency:  NA  HH Arranged:    [place] Agency:  [facility]  Status of Service:  In process, will continue to follow  If discussed at Long Length of Stay Meetings, dates discussed:    Additional Comments:  Marilu Favre, RN 07/31/2017, 10:29 AM

## 2017-07-31 NOTE — Care Management Note (Signed)
Case Management Note  Patient Details  Name: GWENDOLYNE WELFORD MRN: [id number] Date of Birth: [birth year]  Subjective/Objective:                    Action/Plan:  Spoke with Mr Streb he has oxygen and hospital bed at home. PTAR called estimated time of arrival is 1700. Patient's brother at bedside, husband and nurse aware. Nurse has PTAR number and after hours number if needed. Expected Discharge Date:  07/31/17               Expected Discharge Plan:  Home w Hospice Care  In-House Referral:  Clinical Social Work  Discharge planning Services  CM Consult  Post Acute Care Choice:    Choice offered to:  Spouse  DME Arranged:  N/A DME Agency:  NA  HH Arranged:    [place] Agency:  [facility]  Status of Service:  Completed, signed off  If discussed at [employer], dates discussed:    Additional Comments:  Marilu Favre, RN 07/31/2017, 4:22 PM

## 2017-07-31 NOTE — Discharge Instructions (Signed)
Hospice Introduction Hospice is a service that is designed to provide people who are terminally ill and their families with medical, spiritual, and psychological support. Its aim is to improve your quality of life by keeping you as alert and comfortable as possible. Who will be my providers when I begin hospice care? Hospice teams often include:  A nurse.  A doctor. The hospice doctor will be available for your care, but you can bring your regular doctor or nurse practitioner.  Social workers.  Religious leaders (such as a Clinical biochemist).  Trained volunteers.  What roles will providers play in my care? Hospice is performed by a team of health care professionals and volunteers who:  Help keep you comfortable: ? Hospice can be provided in your home or in a homelike setting. ? The hospice staff works with your family and friends to help meet your needs. ? You will enjoy the support of loved ones by receiving much of your basic care from family and friends.  Provide pain relief and manage your symptoms. The staff supply all necessary medicines and equipment.  Provide companionship when you are alone.  Allow you and your family to rest. They may do light housekeeping, prepare meals, and run errands.  Provide counseling. They will make sure your emotional, spiritual, and social needs and those of your family are being met.  Provide spiritual care: ? Spiritual care will be individualized to meet your needs and your family's needs. ? Spiritual care may involve:  Helping you look at what death means to you.  Helping you say goodbye to your family and friends.  Performing a specific religious ceremony or ritual.  When should hospice care begin? Most people who use hospice are believed to have fewer than 6 months to live.  Your family and health care providers can help you decide when hospice services should begin.  If your condition improves, you may discontinue the program.  What  should I consider before selecting a program? Most hospice programs are run by nonprofit, independent organizations. Some are affiliated with hospitals, nursing homes, or home health care agencies. Hospice programs can take place in the home or at a hospice center, hospital, or skilled nursing facility. When choosing a hospice program, ask the following questions:  What services are available to me?  What services will be offered to my loved ones?  How involved will my loved ones be?  How involved will my health care provider be?  Who makes up the hospice care team? How are they trained or screened?  How will my pain and symptoms be managed?  If my circumstances change, can the services be provided in a different setting, such as my home or in the hospital?  Is the program reviewed and licensed by the state or certified in some other way?  Where can I learn more about hospice? You can learn about existing hospice programs in your area from your health care providers. You can also read more about hospice online. The websites of the following organizations contain helpful information:  The [facility] and Palliative Care Organization [facility]).  The Hospice Association of America ([place]).  The [place].  The American Cancer Society (ACS).  Hospice Net.  This information is not intended to replace advice given to you by your health care provider. Make sure you discuss any questions you have with your health care provider. Document Released: 10/06/2003 Document Revised: 02/03/2016 Document Reviewed: 04/29/2013 Elsevier Interactive Patient Education  2017 [employer].

## 2017-07-31 NOTE — Progress Notes (Signed)
Nutrition Brief Note  Chart reviewed. Pt now transitioning to comfort care.  No further nutrition interventions warranted at this time.  Please re-consult as needed.   Lona Six A. Abbigael Detlefsen, RD, LDN, CDE Pager: [phone] After hours Pager: [phone]  

## 2017-07-31 NOTE — Discharge Summary (Signed)
[facility]  Physician Discharge Summary   Patient ID: Shelley [name] MRN: [id number] DOB/AGE: [birth year] 73 y.o.  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 She will be discharged home with hospice. 2. Will eventually go to residential hospice when bed is available.  DISCHARGE CONDITION: stable  Diet recommendation: Dysphagia 1 with nectar thick liquids as tolerated.  Filed Weights   07/13/17 1741 07/14/17 0545 07/22/17 0447  Weight: 69.1 kg (152 lb 5.4 oz) 67.3 kg (148 lb 5.9 oz) 69.7 kg (153 lb 10.6 oz)    INITIAL HISTORY: 72 year old female with a past medical history of alcohol abuse, anxiety disorder, stroke, seizures, hypertension, dementia who initially presented with left foot pain.  This was secondary to acute ischemia.  She was taken to the operating room emergently and underwent thrombectomy.  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  Since then patient has slowly declined.  Palliative medicine was consulted.  Await bed at inpatient/residential hospice.  Consultants:  [facility] Cardiology Palliative Care   Antimicrobials: Cefepime 1/14> 1/18 Vancomycin 1/14> 1/18  Procedures:  1/10 admit w/ L leg pain - taken to OR emergently w/ extensive thrombectomy - L IJ CVL 1/11 L AKA and L common femoral thrombectomy  1/17 L thoracentesis in IR >512ml - transudate    HOSPITAL COURSE:    Goals of Care Due to multiple medical issues and the fact that patient was not progressing well palliative medicine was consulted.  They have had discussions with patient and family.  Patient continues to have poor oral intake and is considered high aspiration risk.  As a result of that she is thought to have poor prognosis.  Initially plan was for her to go to residential hospice.  However bed is not available at their chosen facility.  So family has elected for the patient to go home with hospice for now and then eventually go to residential hospice when bed is available.  L pleural effusion - Aspiration pneumonitis/pneumonia - acute hypoxic respiratory failure- recurring aspiration This was progressively worsening.  Patient has recurrent aspiration. She is off of antibiotics currently.    Dysphagia - recurring aspiration Pt has refused MBS or FEES - appears to be aspirating all consistencies tried at bedside. Situation was discussed with her husband and it was felt that she be allowed to eat accepting the risk of aspiration. Her dementia appears to have progressed to the early end-stages such that her ability to safely maintain nourishment is markedly impaired.  Currently on a dysphagia 1 diet with nectar thick liquids.  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  Appears to be stable from this standpoint   Persistent atrial fibrillation Heart rate was suboptimally controlled.  Been refusing medications.  Seen by cardiology.  Heart rate appears to be stable on diltiazem and digoxin.  No anticoagulation as patient has short life expectancy.  Anemia  of unclear etiology  No signs of overt bleeding. LDH and Haptoglobin not c/w hemolysis - responded better than expected to 2U PRBC - CT abd w/o evidence of acute bleed.  Hemoglobin has been stable.  No further workup at this time.  Altered mental status - vascular dementia - early end-stage dementia/Hx CVA Patient has  been uncooperative.  CT head did not show any acute process but did reveal severe vascular ischemic changes suggesting vascular dementia.    Chronic grade 2 diastolic CHF (per TTE Dec 2018) Appears euvolemic at present.  Hypotension  Resolved w/ simple volume expansion   RUL nodular densities CT angio noted spiculated mass 2 - PET recommended as outpt.  No further workup since she is a hospice candidate.  Thrombocytopenia (baseline~180) Likely due to EtOH abuse   EtOH abuse Stable  Moderate malnutrition in context of acute illness/injury   Patient is medically stable for discharge to either home with hospice or residential hospice.    PERTINENT LABS:  The results of significant diagnostics from this hospitalization (including imaging, microbiology, ancillary and laboratory) are listed below for reference.    Microbiology: Recent Results (from the past 240 hour(s))  MRSA PCR Screening     Status: None   Collection Time: 07/21/17  7:21 PM  Result Value Ref Range Status   MRSA by PCR NEGATIVE NEGATIVE Final    Comment:        The GeneXpert MRSA Assay (FDA approved for NASAL specimens only), is one component of a comprehensive MRSA colonization surveillance program. It is not intended to diagnose MRSA infection nor to guide or monitor treatment for MRSA infections.      Labs: Basic Metabolic Panel: Recent Labs  Lab 07/28/17 0343  NA 140  K 4.1  CL 106  CO2 21*  GLUCOSE 107*  BUN 16  CREATININE 0.64  CALCIUM 8.9   CBC: Recent Labs  Lab 07/28/17 0343  WBC 8.6  HGB 12.3  HCT 38.9  MCV 92.8  PLT 294    IMAGING STUDIES Ct Abdomen Pelvis Wo Contrast  Result Date: 07/20/2017 CLINICAL DATA:  Unexplained acute anemia. EXAM: CT ABDOMEN AND PELVIS WITHOUT CONTRAST TECHNIQUE: Multidetector CT imaging of the abdomen and pelvis was performed following the standard protocol without IV contrast. COMPARISON:  CT scan abdomen dated 10/16/2001 and CT scan  of the chest dated 07/18/2017 FINDINGS: Lower chest: There are persistent small bilateral pleural effusions. There is persistent consolidation and atelectasis in the left lower lobe. Heart size is normal. Hepatobiliary: Liver parenchyma is normal except for some focal fatty infiltration adjacent to the falciform ligament. Gallbladder is slightly distended. No biliary ductal dilatation. Pancreas: The pancreas is chronically atrophic but otherwise normal. Spleen: Normal spleen. 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 No hemorrhage at that site. Adrenals/Urinary Tract: Adrenal glands are unremarkable. Kidneys are normal, without renal calculi, focal lesion, or hydronephrosis. Foley catheter in the otherwise normal appearing bladder. Stomach/Bowel: Slight diverticulosis of the distal colon. Otherwise negative. Vascular/Lymphatic: Aortic atherosclerosis. No enlarged abdominal or pelvic lymph nodes. Reproductive: Status post hysterectomy. No adnexal masses. Other: No retroperitoneal hemorrhage.  Minimal presacral edema. Musculoskeletal: There is a 9 x 3 x 2 cm soft tissue density in the left inguinal region which could represent a small hematoma. There is subcutaneous edema in the soft tissues of the lateral aspects of the buttocks. No acute bone abnormality. IMPRESSION: 1. Small soft tissue density in the left inguinal region  which may represent a hematoma. Has the patient had a recent arterial or venous catheterization in the left groin? 2. No retroperitoneal or other intra-abdominal or pelvic hemorrhage. 3. Persistent small bilateral pleural effusions with atelectasis/consolidation in the left lower lobe. Electronically Signed   By: Lorriane Shire M.D.   On: 07/20/2017 11:35   Dg Chest 1 View  Result Date: 07/19/2017 CLINICAL DATA:  Left pleural effusion. EXAM: CHEST 1 VIEW COMPARISON:  CT chest 07/18/2017 FINDINGS: Small right pleural effusion.  Small left pleural effusion which has significantly improved compared with the prior exam. Bilateral mild interstitial thickening. Spiculated nodule in the right upper lobe is not well delineated on the current exam. No pneumothorax. Stable cardiomediastinal silhouette. Left IJ central venous catheter with the tip projecting over the upper SVC. No acute osseous abnormality. IMPRESSION: Small right pleural effusion. Small left pleural effusion which has significantly improved compared with the prior exam. Bilateral mild interstitial thickening. Spiculated nodule in the right upper lobe is not well delineated on the current exam. Electronically Signed   By: Kathreen Devoid   On: 07/19/2017 16:35   Ct Head Wo Contrast  Result Date: 07/20/2017 CLINICAL DATA:  Confusion.  Dementia.  Previous strokes. EXAM: CT HEAD WITHOUT CONTRAST TECHNIQUE: Contiguous axial images were obtained from the base of the skull through the vertex without intravenous contrast. COMPARISON:  MRI 06/08/2017.  CT 06/08/2017. FINDINGS: Brain: Advanced chronic small vessel ischemic changes throughout the cerebral hemispheric white matter, thalami I and basal ganglia. No sign of acute infarction, mass lesion, hemorrhage, hydrocephalus or extra-axial collection. Vascular: There is atherosclerotic calcification of the major vessels at the base of the brain. Skull: Negative Sinuses/Orbits: Clear/normal Other: None IMPRESSION: No acute finding by CT. Advanced chronic small-vessel ischemic changes throughout the brain as seen previously. Electronically Signed   By: Nelson Chimes M.D.   On: 07/20/2017 11:22   Ct Chest Wo Contrast  Addendum Date: 07/19/2017   ADDENDUM REPORT: 07/19/2017 16:39 ADDENDUM: Not mentioned above is an enlarged precarinal lymph node measuring 13 mm in short axis. Electronically Signed   By: Kathreen Devoid   On: 07/19/2017 16:39   Result Date: 07/19/2017 CLINICAL DATA:  Interstitial lung disease EXAM: CT CHEST WITHOUT CONTRAST  TECHNIQUE: Multidetector CT imaging of the chest was performed following the standard protocol without IV contrast. COMPARISON:  None. FINDINGS: Cardiovascular: No significant vascular findings. Normal heart size. No pericardial effusion. Normal caliber thoracic aorta. Mild thoracic aortic atherosclerosis. Mediastinum/Nodes: No enlarged mediastinal or axillary lymph nodes. Thyroid gland, trachea, and esophagus demonstrate no significant findings. Lungs/Pleura: Large left pleural effusion with compressive atelectasis and overall volume loss. Small right pleural effusion. Mild right basilar atelectasis. Airspace disease in the posterior segment of the right upper lobe most concerning for pneumonia. Spiculated 1.4 x 0.6 mm right upper lobe pulmonary nodule adjacent, partially contiguous, spiculated pulmonary nodule measuring 9 x 8 mm in the right upper lobe. Upper Abdomen: No acute abnormality. Musculoskeletal: No acute osseous abnormality. No aggressive osseous lesion. IMPRESSION: 1. Large left pleural effusion with compressive atelectasis and overall volume loss. 2. Small right pleural effusion.  Mild right basilar atelectasis. 3. Airspace disease in the posterior segment of the right upper lobe most concerning for pneumonia. 4. Spiculated 1.4 x 0.6 mm right upper lobe pulmonary nodule adjacent, partially contiguous, spiculated pulmonary nodule measuring 9 x 8 mm in the right upper lobe. Malignancy is not excluded. PET-CT is recommended. Electronically Signed: By: Kathreen Devoid On: 07/18/2017 14:39   Ct  Angio Ao+bifem W & Or Wo Contrast  Result Date: 07/14/2017 CLINICAL DATA:  Peripheral arterial disease.  Ischemia. EXAM: CT ANGIOGRAPHY CHEST, ABDOMEN AND PELVIS, AORTOGRAM AND RUNOFF TECHNIQUE: Multidetector CT imaging through the chest, abdomen and pelvis was performed using the standard protocol during bolus administration of intravenous contrast. Multiplanar reconstructed images and MIPs were obtained and  reviewed to evaluate the vascular anatomy. CONTRAST:  139mL ISOVUE-370 IOPAMIDOL (ISOVUE-370) INJECTION 76% COMPARISON:  10/16/2001 FINDINGS: CTA CHEST FINDINGS Cardiovascular: Atherosclerotic calcifications of the aortic arch and great vessels are noted. There is no evidence of aortic dissection or obvious intramural hematoma. Maximal diameter of the ascending aorta is 3.6 cm. There is no obvious acute pulmonary thromboembolism. Great vessels are grossly patent. Vertebral arteries both opacify normally within the confines of the examination. Left vertebral is dominant. Mediastinum/Nodes: There is no abnormal mediastinal adenopathy by measurement criteria. Physiologic pericardial fluid is noted. Thyroid is within normal limits. Esophagus is unremarkable. Lungs/Pleura: No pneumothorax. No pleural effusion. 1.3 cm irregular mass with spiculation in the right upper lobe on image 44. Similar 1.1 cm spiculated mass in the right upper lobe on image 41. There is dense consolidation and collapse nearly involving the entire left lower lobe. Small peripheral airways appear occluded. No obvious central obstructing airway mass. There is severe narrowing involving the territory segment to the superior segment of the left lower lobe. There is some focal atelectasis versus consolidation at the base of the lingula. Musculoskeletal: No vertebral compression deformity. Sternum is intact. No acute rib deformity. Review of the MIP images confirms the above findings. CTA ABDOMEN AND PELVIS FINDINGS VASCULAR Aorta: Aorta is nonaneurysmal and patent. Scattered atherosclerotic calcifications in the infrarenal aorta. 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 Branch vessels are patent. 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 SMA: Patent.  Branch vessels grossly patent. Renals: Atherosclerotic calcification is present at the origin of the bilateral single renal arteries. No significant narrowing. IMA:  Diminutive and patent. Inflow: Atherosclerotic calcifications are present in the bilateral common iliac arteries without aneurysmal dilatation or significant narrowing. External iliac arteries are also patent with minimal atherosclerotic change. Right internal iliac artery is patent. The main left internal iliac artery is patent. The posterior division is patent. There is abrupt occlusion of the anterior division of the left internal iliac artery there is low-density filling the vessel at this occlusion worrisome for acute thrombosis. Lower extremities: Right common femoral, profunda femoral, and superficial femoral arteries are patent. Right popliteal artery is patent. Tibial vessels are patent for 3 vessel runoff. The left common femoral artery is occluded abruptly. There is very poor filling of muscular branches of the profunda femoral artery. The main profunda femoral artery and superficial femoral artery are also completely occluded. The left popliteal artery is occluded. There is no arterial opacification below the mid thigh. Review of the MIP images confirms the above findings. NON-VASCULAR Hepatobiliary: Diffuse hepatic steatosis. Gallbladder is slightly dilated. There is wall thickening at the fundus of unknown significance. Common bile duct is dilated. Pancreas: Atrophic. Spleen: Unremarkable. Adrenals/Urinary Tract: Adrenal glands are within normal limits. Chronic changes of the kidneys are present. Bladder is decompressed by a Foley catheter. Stomach/Bowel: Stomach is decompressed. Large duodenal diverticulum involves the descending portion. No evidence of small-bowel obstruction. No obvious mass in the colon. Sigmoid diverticulosis. Lymphatic: No abnormal retroperitoneal adenopathy. Reproductive: Uterus is absent.  Adnexa are unremarkable. Other: There is no free fluid. Musculoskeletal: There is gas within the deep soft  tissues of the left lower extremity. There is gas within the popliteal fossa and  medial to the proximal left tibia. There is also gas lateral to the mid left leg musculature as well as adjacent to the tibial vasculature of the mid leg. These findings suggest necrotizing fasciitis and infection with gas-forming organism. The musculature within the deep tissues of the left leg are engorged. There is blurring of the fat planes. These findings suggest swelling and possibly ischemic change and impending compartment syndrome. Review of the MIP images confirms the above findings. IMPRESSION: Vascular: There is abrupt occlusion of the left common femoral artery. The profunda femoral, superficial femoral, popliteal artery, and tibial arteries are also non-opacified in the left lower extremity. Diffuse vessel occlusion and thrombosis cannot be excluded. Given gas in the soft tissues of the left lower extremity, ischemia with necrotizing fasciitis and possible dead tissue are of clinical concern. Preliminary report was given during the time of the examination. I am interpreting the examination the following morning after left leg amputation. There is occlusion of the anterior division of the left internal iliac artery. No evidence of right lower extremity arterial occlusion. 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 No acute vascular abnormality within the thorax. Specifically, no evidence of intraluminal thrombus within the aorta. No evidence of dissection or intramural hematoma. Nonvascular: There are 2 spiculated masses in the right upper lobe as described. Malignancy is not excluded. PET-CT is recommended. Consolidation and collapse in the left lower lobe as described. Swelling and fullness of the musculature in the left leg worrisome for ischemia an impending compartment syndrome. Electronically Signed   By: Marybelle Killings M.D.   On: 07/14/2017 08:41   Dg Chest Port 1 View  Result Date: 07/22/2017 CLINICAL DATA:  72 year old female with shortness of breath. EXAM: PORTABLE CHEST 1 VIEW COMPARISON:   Chest radiograph dated 07/20/2017 FINDINGS: An area of increased opacity in the left mid to lower lung field similar to prior radiograph most consistent with pneumonia. Faint density in the right upper lung also similar to prior radiograph. Bilateral small pleural effusions similar to prior radiograph. There is no pneumothorax stable cardiac silhouette. Atherosclerotic calcification of the aortic arch no acute osseous pathology. Vascular calcification in the upper abdomen noted. IMPRESSION: Overall no significant interval change in the aeration of the lungs, and pulmonary opacities compared to prior radiograph. Bilateral pleural effusions similar to prior radiograph. Clinical correlation and follow-up recommended. Electronically Signed   By: Anner Crete M.D.   On: 07/22/2017 06:22   Dg Chest Port 1 View  Result Date: 07/20/2017 CLINICAL DATA:  Dyspnea EXAM: PORTABLE CHEST 1 VIEW COMPARISON:  Chest radiograph from one day prior. FINDINGS: Right rotated chest radiograph. Left internal jugular central venous catheter terminates in the left brachiocephalic vein near the junction with the SVC. Stable cardiomediastinal silhouette with top-normal heart size. No pneumothorax. Small bilateral pleural effusions, minimally increased. Patchy left lung base consolidation, increased. Stable mild hazy right upper lung and right lung base opacities. IMPRESSION: 1. Increased patchy left lung base consolidation with stable mild hazy right upper and right basilar lung opacities. Findings are suggestive of worsening multilobar pneumonia or aspiration, with a component of pulmonary edema not excluded. 2. Small bilateral pleural effusions, minimally increased. Electronically Signed   By: Ilona Sorrel M.D.   On: 07/20/2017 02:58   Dg Chest Port 1 View  Result Date: 07/18/2017 CLINICAL DATA:  Shortness of Breath EXAM: PORTABLE CHEST 1 VIEW COMPARISON:  July 16, 2017 FINDINGS: Central  catheter tip is at the junction of the  left innominate vein and superior vena cava. No pneumothorax. There is extensive consolidation throughout the left lung with volume loss, increased from 2 days prior. There is persistent elevation of the left hemidiaphragm. Right lung is clear except for mild scarring in the right upper lobe. Heart size is normal. There is aortic atherosclerosis. No adenopathy evident. Visualized pulmonary vascularity is unremarkable. IMPRESSION: Near complete consolidation left lung with volume loss. There has been progression of consolidation and volume loss on the left compared to 2 days prior. Right lung clear except for mild scarring right upper lobe. Stable cardiac silhouette. No pneumothorax. There is aortic atherosclerosis. Aortic Atherosclerosis (ICD10-I70.0). Electronically Signed   By: Lowella Grip III M.D.   On: 07/18/2017 07:59   Dg Chest Port 1 View  Result Date: 07/16/2017 CLINICAL DATA:  Hypoxia EXAM: PORTABLE CHEST 1 VIEW COMPARISON:  07/12/2017 FINDINGS: Moderate left pleural effusion with left lower lobe atelectasis or infiltrate. Appearance is concerning for pneumonia. Small right pleural effusions suspected. No confluent opacity on the right. Heart is borderline in size. Left central line is unchanged. IMPRESSION: Moderate left effusion with left lower lobe airspace opacity concerning for pneumonia. Small right effusion. Electronically Signed   By: Rolm Baptise M.D.   On: 07/16/2017 10:53   Dg Chest Port 1 View  Result Date: 07/12/2017 CLINICAL DATA:  CVL PLACEMENT-LEFT IJ EXAM: PORTABLE CHEST 1 VIEW COMPARISON:  Chest x-ray from earlier same day. FINDINGS: Left IJ central line appears adequately positioned with tip at the level of the mid/upper SVC. Heart size and mediastinal contours are stable. Nodular density was described in the right upper lung on earlier chest x-ray, not as well seen on today's study which may be related to oblique patient positioning. No pneumothorax seen. IMPRESSION: Left  IJ central line in place with tip at the level of the mid/upper SVC. No pneumothorax. Electronically Signed   By: Franki Cabot M.D.   On: 07/12/2017 19:23   Dg Chest Portable 1 View  Result Date: 07/12/2017 CLINICAL DATA:  Coughing congestion. EXAM: PORTABLE CHEST 1 VIEW COMPARISON:  06/08/2017 FINDINGS: 1534 hours. Persistent nodular density in the right upper lung. Left base atelectasis or scarring noted. No pulmonary edema or substantial pleural effusion. Cardiopericardial silhouette is at upper limits of normal for size. The visualized bony structures of the thorax are intact. Telemetry leads overlie the chest. IMPRESSION: Persistent irregular nodular density in the right upper lobe. CT chest without contrast recommended to further evaluate. Left base atelectasis or scarring. Electronically Signed   By: Misty Stanley M.D.   On: 07/12/2017 15:48   Ct Angio Chest Aorta W/cm &/or Wo/cm  Result Date: 07/14/2017 CLINICAL DATA:  Peripheral arterial disease.  Ischemia. EXAM: CT ANGIOGRAPHY CHEST, ABDOMEN AND PELVIS, AORTOGRAM AND RUNOFF TECHNIQUE: Multidetector CT imaging through the chest, abdomen and pelvis was performed using the standard protocol during bolus administration of intravenous contrast. Multiplanar reconstructed images and MIPs were obtained and reviewed to evaluate the vascular anatomy. CONTRAST:  165mL ISOVUE-370 IOPAMIDOL (ISOVUE-370) INJECTION 76% COMPARISON:  10/16/2001 FINDINGS: CTA CHEST FINDINGS Cardiovascular: Atherosclerotic calcifications of the aortic arch and great vessels are noted. There is no evidence of aortic dissection or obvious intramural hematoma. Maximal diameter of the ascending aorta is 3.6 cm. There is no obvious acute pulmonary thromboembolism. Great vessels are grossly patent. Vertebral arteries both opacify normally within the confines of the examination. Left vertebral is dominant. Mediastinum/Nodes: There is no abnormal mediastinal adenopathy  by measurement  criteria. Physiologic pericardial fluid is noted. Thyroid is within normal limits. Esophagus is unremarkable. Lungs/Pleura: No pneumothorax. No pleural effusion. 1.3 cm irregular mass with spiculation in the right upper lobe on image 44. Similar 1.1 cm spiculated mass in the right upper lobe on image 41. There is dense consolidation and collapse nearly involving the entire left lower lobe. Small peripheral airways appear occluded. No obvious central obstructing airway mass. There is severe narrowing involving the territory segment to the superior segment of the left lower lobe. There is some focal atelectasis versus consolidation at the base of the lingula. Musculoskeletal: No vertebral compression deformity. Sternum is intact. No acute rib deformity. Review of the MIP images confirms the above findings. CTA ABDOMEN AND PELVIS FINDINGS VASCULAR Aorta: Aorta is nonaneurysmal and patent. Scattered atherosclerotic calcifications in the infrarenal aorta. 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 Branch vessels are patent. 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 SMA: Patent.  Branch vessels grossly patent. Renals: Atherosclerotic calcification is present at the origin of the bilateral single renal arteries. No significant narrowing. IMA: Diminutive and patent. Inflow: Atherosclerotic calcifications are present in the bilateral common iliac arteries without aneurysmal dilatation or significant narrowing. External iliac arteries are also patent with minimal atherosclerotic change. Right internal iliac artery is patent. The main left internal iliac artery is patent. The posterior division is patent. There is abrupt occlusion of the anterior division of the left internal iliac artery there is low-density filling the vessel at this occlusion worrisome for acute thrombosis. Lower extremities: Right common femoral, profunda femoral, and superficial femoral arteries are patent. Right  popliteal artery is patent. Tibial vessels are patent for 3 vessel runoff. The left common femoral artery is occluded abruptly. There is very poor filling of muscular branches of the profunda femoral artery. The main profunda femoral artery and superficial femoral artery are also completely occluded. The left popliteal artery is occluded. There is no arterial opacification below the mid thigh. Review of the MIP images confirms the above findings. NON-VASCULAR Hepatobiliary: Diffuse hepatic steatosis. Gallbladder is slightly dilated. There is wall thickening at the fundus of unknown significance. Common bile duct is dilated. Pancreas: Atrophic. Spleen: Unremarkable. Adrenals/Urinary Tract: Adrenal glands are within normal limits. Chronic changes of the kidneys are present. Bladder is decompressed by a Foley catheter. Stomach/Bowel: Stomach is decompressed. Large duodenal diverticulum involves the descending portion. No evidence of small-bowel obstruction. No obvious mass in the colon. Sigmoid diverticulosis. Lymphatic: No abnormal retroperitoneal adenopathy. Reproductive: Uterus is absent.  Adnexa are unremarkable. Other: There is no free fluid. Musculoskeletal: There is gas within the deep soft tissues of the left lower extremity. There is gas within the popliteal fossa and medial to the proximal left tibia. There is also gas lateral to the mid left leg musculature as well as adjacent to the tibial vasculature of the mid leg. These findings suggest necrotizing fasciitis and infection with gas-forming organism. The musculature within the deep tissues of the left leg are engorged. There is blurring of the fat planes. These findings suggest swelling and possibly ischemic change and impending compartment syndrome. Review of the MIP images confirms the above findings. IMPRESSION: Vascular: There is abrupt occlusion of the left common femoral artery. The profunda femoral, superficial femoral, popliteal artery, and tibial  arteries are also non-opacified in the left lower extremity. Diffuse vessel occlusion and thrombosis cannot be excluded. Given gas in the soft tissues of the left lower extremity, ischemia with necrotizing  fasciitis and possible dead tissue are of clinical concern. Preliminary report was given during the time of the examination. I am interpreting the examination the following morning after left leg amputation. There is occlusion of the anterior division of the left internal iliac artery. No evidence of right lower extremity arterial occlusion. 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 No acute vascular abnormality within the thorax. Specifically, no evidence of intraluminal thrombus within the aorta. No evidence of dissection or intramural hematoma. Nonvascular: There are 2 spiculated masses in the right upper lobe as described. Malignancy is not excluded. PET-CT is recommended. Consolidation and collapse in the left lower lobe as described. Swelling and fullness of the musculature in the left leg worrisome for ischemia an impending compartment syndrome. Electronically Signed   By: Marybelle Killings M.D.   On: 07/14/2017 08:41   Ir Thoracentesis Asp Pleural Space W/img Guide  Result Date: 07/19/2017 INDICATION: Shortness of breath. Left-sided pleural effusion. Request for diagnostic and therapeutic thoracentesis. EXAM: ULTRASOUND GUIDED LEFT THORACENTESIS MEDICATIONS: None. COMPLICATIONS: None immediate. PROCEDURE: An ultrasound guided thoracentesis was thoroughly discussed with the patient and questions answered. The benefits, risks, alternatives and complications were also discussed. The patient understands and wishes to proceed with the procedure. Written consent was obtained. Ultrasound was performed to localize and mark an adequate pocket of fluid in the left chest. The area was then prepped and draped in the normal sterile fashion. 1% Lidocaine was used for local anesthesia. Under ultrasound guidance a  Safe-T-Centesis catheter was introduced. Thoracentesis was performed. The catheter was removed and a dressing applied. FINDINGS: A total of approximately 550 mL of hazy, amber colored fluid was removed. Samples were sent to the laboratory as requested by the clinical team. IMPRESSION: Successful ultrasound guided left thoracentesis yielding 550 mL of pleural fluid. Read by: Ascencion Dike PA-C Electronically Signed   By: Aletta Edouard M.D.   On: 07/19/2017 17:04    DISCHARGE EXAMINATION: Vitals:   07/30/17 0618 07/30/17 1300 07/30/17 2034 07/31/17 0457  BP: (!) 114/96 111/82 121/72 114/70  Pulse: 71 96 (!) 107 98  Resp: (!) 24 (!) 24 20 20   Temp: (!) 97.5 F (36.4 C) 97.8 F (36.6 C) 97.9 F (36.6 C) 98 F (36.7 C)  TempSrc: Oral Oral Axillary   SpO2: 97% 98% 95% 97%  Weight:      Height:       General appearance: alert, distracted and no distress Resp: clear to auscultation bilaterally Cardio: S1-S2 is irregularly irregular.  Normal rate.  DISPOSITION: Home with hospice     Allergies as of 07/31/2017      Reactions   Acyclovir And Related       Medication List    STOP taking these medications   amLODipine 10 MG tablet Commonly known as:  NORVASC   atorvastatin 20 MG tablet Commonly known as:  LIPITOR   clopidogrel 75 MG tablet Commonly known as:  PLAVIX   lisinopril 10 MG tablet Commonly known as:  PRINIVIL,ZESTRIL   magnesium oxide 400 MG tablet Commonly known as:  MAG-OX   metoprolol tartrate 25 MG tablet Commonly known as:  LOPRESSOR   naproxen sodium 220 MG tablet Commonly known as:  ALEVE   nicotine 21 mg/24hr patch Commonly known as:  NICODERM CQ - dosed in mg/24 hours     TAKE these medications   Digoxin 187.5 MCG Tabs Take 0.1875 mg by mouth daily.   diltiazem 300 MG 24 hr capsule Commonly known as:  CARDIZEM CD Take  1 capsule (300 mg total) by mouth daily.   levETIRAcetam 500 MG tablet Commonly known as:  KEPPRA Take 1 tablet (500 mg  total) by mouth 2 (two) times daily.   LORazepam 2 MG/ML concentrated solution Commonly known as:  ATIVAN Take 0.5 mLs (1 mg total) by mouth every 4 (four) hours as needed for anxiety.   morphine CONCENTRATE 10 MG/0.5ML Soln concentrated solution Place 0.25 mLs (5 mg total) under the tongue every 3 (three) hours as needed for severe pain.   oxyCODONE 5 MG immediate release tablet Commonly known as:  Oxy IR/ROXICODONE Take 1-2 tablets (5-10 mg total) by mouth every 6 (six) hours as needed for moderate pain.   polyethylene glycol packet Commonly known as:  MIRALAX / GLYCOLAX Take 17 g by mouth daily.   traZODone 50 MG tablet Commonly known as:  DESYREL Take 1 tablet (50 mg total) by mouth at bedtime.        Follow-up Information    [name], MD. Call.   Specialty:  Internal Medicine Why:  as needed Contact information: [address] [place] [address] [phone]        Evans Lance, MD .   Specialty:  Cardiology Contact information: [phone] [place] [address] [phone]           TOTAL DISCHARGE TIME: 35 mins  Bonnielee Haff  [facility] Pager [phone]  07/31/2017, 12:03 PM

## 2017-07-31 NOTE — Progress Notes (Signed)
Reviewed discharge instructions with patients son bedside and husband by phone. Verbalization of patients diet, discharge instructions, medication changes and transport were discussed and understood. Patient will be transported via [facility] home with husband. [facility] notified and contacted, patient is on list for pickup.

## 2017-08-02 DIAGNOSIS — R279 Unspecified lack of coordination: Secondary | ICD-10-CM | POA: Diagnosis not present

## 2017-08-02 DIAGNOSIS — Z743 Need for continuous supervision: Secondary | ICD-10-CM | POA: Diagnosis not present

## 2017-08-11 ENCOUNTER — Emergency Department (HOSPITAL_COMMUNITY)
Admission: EM | Admit: 2017-08-11 | Discharge: 2017-08-11 | Disposition: A | Discharge status: Home / Self Care | Payer: Medicare Other | Attending: Emergency Medicine | Admitting: Emergency Medicine

## 2017-08-11 ENCOUNTER — Encounter (HOSPITAL_COMMUNITY): Payer: Self-pay | Admitting: Emergency Medicine

## 2017-08-11 ENCOUNTER — Emergency Department (HOSPITAL_COMMUNITY): Payer: Medicare Other

## 2017-08-11 DIAGNOSIS — X58XXXD Exposure to other specified factors, subsequent encounter: Secondary | ICD-10-CM | POA: Diagnosis not present

## 2017-08-11 DIAGNOSIS — Z89611 Acquired absence of right leg above knee: Secondary | ICD-10-CM | POA: Diagnosis not present

## 2017-08-11 DIAGNOSIS — F17210 Nicotine dependence, cigarettes, uncomplicated: Secondary | ICD-10-CM | POA: Insufficient documentation

## 2017-08-11 DIAGNOSIS — S8992XD Unspecified injury of left lower leg, subsequent encounter: Secondary | ICD-10-CM | POA: Diagnosis not present

## 2017-08-11 DIAGNOSIS — Z5189 Encounter for other specified aftercare: Secondary | ICD-10-CM | POA: Diagnosis not present

## 2017-08-11 DIAGNOSIS — M96840 Postprocedural hematoma of a musculoskeletal structure following a musculoskeletal system procedure: Secondary | ICD-10-CM | POA: Diagnosis not present

## 2017-08-11 DIAGNOSIS — I10 Essential (primary) hypertension: Secondary | ICD-10-CM | POA: Insufficient documentation

## 2017-08-11 DIAGNOSIS — T8189XA Other complications of procedures, not elsewhere classified, initial encounter: Secondary | ICD-10-CM | POA: Diagnosis not present

## 2017-08-11 DIAGNOSIS — T8131XA Disruption of external operation (surgical) wound, not elsewhere classified, initial encounter: Secondary | ICD-10-CM | POA: Diagnosis not present

## 2017-08-11 DIAGNOSIS — T879 Unspecified complications of amputation stump: Secondary | ICD-10-CM | POA: Diagnosis not present

## 2017-08-11 LAB — BASIC METABOLIC PANEL
Anion gap: 12 (ref 5–15)
BUN: 14 mg/dL (ref 6–20)
CHLORIDE: 102 mmol/L (ref 101–111)
CO2: 22 mmol/L (ref 22–32)
Calcium: 7.9 mg/dL — ABNORMAL LOW (ref 8.9–10.3)
Creatinine, Ser: 0.55 mg/dL (ref 0.44–1.00)
GFR calc Af Amer: >60 mL/min (ref >60)
GFR calc non Af Amer: >60 mL/min (ref >60)
GLUCOSE: 183 mg/dL — AB (ref 65–99)
POTASSIUM: 3.8 mmol/L (ref 3.5–5.1)
Sodium: 136 mmol/L (ref 135–145)

## 2017-08-11 LAB — CBC WITH DIFFERENTIAL/PLATELET
Basophils Absolute: 0 10*3/uL (ref 0.0–0.1)
Basophils Relative: 0 %
EOS PCT: 3 %
Eosinophils Absolute: 0.3 10*3/uL (ref 0.0–0.7)
HCT: 38 % (ref 36.0–46.0)
Hemoglobin: 12 g/dL (ref 12.0–15.0)
LYMPHS ABS: 1.2 10*3/uL (ref 0.7–4.0)
LYMPHS PCT: 12 %
MCH: 29.6 pg (ref 26.0–34.0)
MCHC: 31.6 g/dL (ref 30.0–36.0)
MCV: 93.8 fL (ref 78.0–100.0)
MONO ABS: 0.8 10*3/uL (ref 0.1–1.0)
Monocytes Relative: 8 %
Neutro Abs: 7.7 10*3/uL (ref 1.7–7.7)
Neutrophils Relative %: 77 %
PLATELETS: 215 10*3/uL (ref 150–400)
RBC: 4.05 MIL/uL (ref 3.87–5.11)
RDW: 19.1 % — AB (ref 11.5–15.5)
WBC: 10 10*3/uL (ref 4.0–10.5)

## 2017-08-11 LAB — I-STAT TROPONIN, ED: Troponin i, poc: 0.01 ng/mL (ref 0.00–0.08)

## 2017-08-11 LAB — I-STAT CG4 LACTIC ACID, ED: Lactic Acid, Venous: 2.13 mmol/L (ref 0.5–1.9)

## 2017-08-11 MED ORDER — SODIUM CHLORIDE 0.9 % IV BOLUS (SEPSIS)
1000.0000 mL | Freq: Once | INTRAVENOUS | Status: AC
  Administered 2017-08-11: 1000 mL via INTRAVENOUS

## 2017-08-11 NOTE — Consult Note (Signed)
ED Consult    Reason for Consult:  Drainage from amputation site Referring Physician:  ED (Long) MRN #:  [id number]  History of Present Illness: This is a 72 y.o. 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  She was discharged to hospice from the hospital A few weeks ago.  She now returns with wound of the left above-knee amputation site.  She denies any fevers or chills.  Her husband nursing home can perform wound care but was uncomfortable with the drainage.  Drainage has been bloody and purulent.  Patient was previously on anticoagulation but unclear that she is now.  States that she would not want any aggressive intervention at this time.   Past Medical History:  Diagnosis Date  . Alcohol abuse    drinks awhile   . Anxiety   . CVA (cerebral infarction)    pt denies  . Hypertension   . Osteoporosis   . Seizures ([place])     Past Surgical History:  Procedure Laterality Date  . ABDOMINAL HYSTERECTOMY    . AMPUTATION Left 07/13/2017   Procedure: AMPUTATION ABOVE KNEE;  Surgeon: Waynetta Sandy, MD;  Location: [facility];  Service: Vascular;  Laterality: Left;  . BREAST LUMPECTOMY    . COLONOSCOPY WITH PROPOFOL  06/04/2012   Procedure: COLONOSCOPY WITH PROPOFOL;  Surgeon: Danie Binder, MD;  Location: [facility];  Service: Endoscopy;  Laterality: N/A;  entered cecum @ 0752; total cecal withdrawal time = 13 minutes  . EMPYEMA DRAINAGE    . FEMORAL-POPLITEAL BYPASS GRAFT Left 07/12/2017   Procedure: Left Leg Popliteal, Superficial Femoral Artery, [place] Femoral Artery, Anterior Peroneal Artery Thrombectomies.;  Surgeon: Conrad Isabella, MD;  Location: [facility];  Service: Vascular;  Laterality: Left;  . FLEXIBLE SIGMOIDOSCOPY  03/14/99   internal hemorrhoids  . IR THORACENTESIS ASP PLEURAL SPACE W/IMG GUIDE  07/19/2017  . OOPHORECTOMY    . POLYPECTOMY  06/04/2012   Procedure: POLYPECTOMY;  Surgeon: Danie Binder, MD;   Location: [facility];  Service: Endoscopy;  Laterality: N/A;  descending colon polyps x2; rectal polyp x1  . THROMBECTOMY FEMORAL ARTERY Left 07/13/2017   Procedure: THROMBECTOMY FEMORAL ARTERY;  Surgeon: Waynetta Sandy, MD;  Location: [facility];  Service: Vascular;  Laterality: Left;    Allergies  Allergen Reactions  . Acyclovir And Related     Prior to Admission medications   Medication Sig Start Date End Date Taking? Authorizing Provider  Digoxin 187.5 MCG TABS Take 0.1875 mg by mouth daily. 07/31/17   Bonnielee Haff, MD  diltiazem (CARDIZEM CD) 300 MG 24 hr capsule Take 1 capsule (300 mg total) by mouth daily. 07/31/17   Bonnielee Haff, MD  levETIRAcetam (KEPPRA) 500 MG tablet Take 1 tablet (500 mg total) by mouth 2 (two) times daily. 07/31/17   Bonnielee Haff, MD  LORazepam (ATIVAN) 2 MG/ML concentrated solution Take 0.5 mLs (1 mg total) by mouth every 4 (four) hours as needed for anxiety. 07/31/17   Bonnielee Haff, MD  Morphine Sulfate (MORPHINE CONCENTRATE) 10 MG/0.5ML SOLN concentrated solution Place 0.25 mLs (5 mg total) under the tongue every 3 (three) hours as needed for severe pain. 07/31/17   Bonnielee Haff, MD  oxyCODONE (OXY IR/ROXICODONE) 5 MG immediate release tablet Take 1-2 tablets (5-10 mg total) by mouth every 6 (six) hours as needed for moderate pain. 07/31/17   Bonnielee Haff, MD  polyethylene glycol [facility] / Floria Raveling) packet Take 17 g by mouth  daily. 07/31/17   Bonnielee Haff, MD  traZODone (DESYREL) 50 MG tablet Take 1 tablet (50 mg total) by mouth at bedtime. 07/31/17   Bonnielee Haff, MD    Social History   Socioeconomic History  . Marital status: Married    Spouse name: Not on file  . Number of children: Not on file  . Years of education: Not on file  . Highest education level: Not on file  Social Needs  . Financial resource strain: Not on file  . Food insecurity - worry: Not on file  . Food insecurity - inability: Not on file  . Transportation needs  - medical: Not on file  . Transportation needs - non-medical: Not on file  Occupational History  . Not on file  Tobacco Use  . Smoking status: Current Some Day Smoker    Packs/day: 0.50    Types: Cigarettes  . Smokeless tobacco: Never Used  Substance and Sexual Activity  . Alcohol use: No    Comment: hx of ETOH abuse  . Drug use: No  . Sexual activity: Not on file  Other Topics Concern  . Not on file  Social History Narrative  . Not on file     Family History  Problem Relation Age of Onset  . Colon cancer Neg Hx     REVIEW OF SYSTEMS (negative unless checked):   Cardiac:  []  Chest pain or chest pressure? []  Shortness of breath upon activity? []  Shortness of breath when lying flat? []  Irregular heart rhythm?  Vascular:  []  Pain in calf, thigh, or hip brought on by walking? []  Pain in feet at night that wakes you up from your sleep? []  Blood clot in your veins? [x]  Leg swelling?  Pulmonary:  [x]  Oxygen at home? []  Productive cough? []  Wheezing?  Neurologic:  []  Sudden weakness in arms or legs? []  Sudden numbness in arms or legs? []  Sudden onset of difficult speaking or slurred speech? []  Temporary loss of vision in one eye? []  Problems with dizziness?  Gastrointestinal:  []  Blood in stool? []  Vomited blood?  Genitourinary:  []  Burning when urinating? []  Blood in urine?  Psychiatric:  []  Major depression  Hematologic:  []  Bleeding problems? []  Problems with blood clotting?  Dermatologic:  [x]  wound- surgical  Constitutional:  []  Fever or chills?  Ear/Nose/Throat:  []  Change in hearing? []  Nose bleeds? []  Sore throat?  Musculoskeletal:  []  Back pain? []  Joint pain? []  Muscle pain?    Physical Examination  Vitals:   08/11/17 1500 08/11/17 1501  BP: (!) 98/53   Pulse:  64  Resp: 16 20  Temp:    SpO2:  100%   There is no height or weight on file to calculate BMI.  General:  WDWN in NAD HENT: WNL, normocephalic Pulmonary:  normal non-labored breathing, without Rales, rhonchi,  wheezing Cardiac: rrr. Weakly palpable left femoral pulse Abdomen: soft Extremities:left aka site with central drainage, site was opened and revealed old hematoma and fat necrosis No purulence or surround cellulitis  Neurologic: awake and alert, SENSATION: normal; MOTOR FUNCTION:  moving all extremities equally. Speech is fluent/normal Psychiatric:  Appropriate mood and affect   CBC    Component Value Date/Time   WBC 10.0 08/11/2017 1422   RBC 4.05 08/11/2017 1422   HGB 12.0 08/11/2017 1422   HCT 38.0 08/11/2017 1422   PLT 215 08/11/2017 1422   MCV 93.8 08/11/2017 1422   MCH 29.6 08/11/2017 1422   MCHC 31.6 08/11/2017 1422  RDW 19.1 (H) 08/11/2017 1422   LYMPHSABS 1.2 08/11/2017 1422   MONOABS 0.8 08/11/2017 1422   EOSABS 0.3 08/11/2017 1422   BASOSABS 0.0 08/11/2017 1422    BMET    Component Value Date/Time   NA 140 07/28/2017 0343   K 4.1 07/28/2017 0343   CL 106 07/28/2017 0343   CO2 21 (L) 07/28/2017 0343   GLUCOSE 107 (H) 07/28/2017 0343   BUN 16 07/28/2017 0343   CREATININE 0.64 07/28/2017 0343   CALCIUM 8.9 07/28/2017 0343   GFRNONAA >60 07/28/2017 0343   GFRAA >60 07/28/2017 0343    COAGS: Lab Results  Component Value Date   INR 1.18 07/13/2017   INR 1.02 06/08/2017      ASSESSMENT/PLAN: This is a 72 y.o. female presents from inpatient hospice with drainage from her left above-knee amputation site.  After removing several staples identified old hematoma as well as fat necrosis.  This was evacuated and the wound was packed with wet-to-dry dressing.  She will need twice daily dressing changes but given that she does not appear systemically ill should be safe for discharge back to inpatient hospice.  We will get her to follow-up in a few weeks for wound check.  Beda Dula C. Donzetta Matters, MD [facility] Office: [phone] Pager: [phone]

## 2017-08-11 NOTE — ED Provider Notes (Signed)
Emergency Department Provider Note   I have reviewed the triage vital signs and the nursing notes.   HISTORY  Chief Complaint Wound Check   HPI Shelley [name] is a 72 y.o. female with PMH of CVA, HTN, and recent thrombectomy followed by left AKA with Dr. Donzetta Matters on 07/13/17 and was ultimately transferred to home hospice on discharge presents to the ED with drainage from the AKA wound. Denies fever, chills, or pain in the area. No weakness, fatigue, or lightheadedness. Patient has been eating and drinking well.   Patient is a hospice patient.  She confirms her DNR/DNI status.  She is unsure if she would want antibiotics.  She does have some mild hypotension and will accept IV fluids.  Plan for labs and will discuss antibiotic further if needed.   Past Medical History:  Diagnosis Date  . Alcohol abuse    drinks awhile   . Anxiety   . CVA (cerebral infarction)    pt denies  . Hypertension   . Osteoporosis   . Seizures [facility])     Patient Active Problem List   Diagnosis Date Noted  . Poor appetite   . Shortness of breath   . Palliative care encounter   . Dysphagia   . Goals of care, counseling/discussion   . Palliative care by specialist   . Persistent atrial fibrillation ([place])   . Acute respiratory failure with hypoxia ([place])   . Malnutrition of moderate degree 07/17/2017  . Hypoxia   . Atelectasis   . Pleural effusion   . Tobacco abuse   . Lung nodule   . Thromboembolism ([place]) 07/12/2017  . 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 Adjustment disorder with mixed anxiety and depressed mood   . Left pontine cerebrovascular accident ([place]) 06/13/2017  . Benign essential HTN   . Diastolic dysfunction   . Hypokalemia   . Stroke (cerebrum) ([place]) 06/10/2017  . Acute lacunar stroke ([place]) 06/08/2017  . Diplopia 06/08/2017  . Hypertension   . Anxiety   . Seizures ([place])   . Encounter for screening colonoscopy 05/14/2012    Past Surgical  History:  Procedure Laterality Date  . ABDOMINAL HYSTERECTOMY    . AMPUTATION Left 07/13/2017   Procedure: AMPUTATION ABOVE KNEE;  Surgeon: Waynetta Sandy, MD;  Location: [place];  Service: Vascular;  Laterality: Left;  . BREAST LUMPECTOMY    . COLONOSCOPY WITH PROPOFOL  06/04/2012   Procedure: COLONOSCOPY WITH PROPOFOL;  Surgeon: Danie Binder, MD;  Location: [facility];  Service: Endoscopy;  Laterality: N/A;  entered cecum @ 0752; total cecal withdrawal time = 13 minutes  . EMPYEMA DRAINAGE    . FEMORAL-POPLITEAL BYPASS GRAFT Left 07/12/2017   Procedure: Left Leg Popliteal, Superficial Femoral Artery, [place] Femoral Artery, Anterior Peroneal Artery Thrombectomies.;  Surgeon: Conrad St. James, MD;  Location: [place];  Service: Vascular;  Laterality: Left;  . FLEXIBLE SIGMOIDOSCOPY  03/14/99   internal hemorrhoids  . IR THORACENTESIS ASP PLEURAL SPACE W/IMG GUIDE  07/19/2017  . OOPHORECTOMY    . POLYPECTOMY  06/04/2012   Procedure: POLYPECTOMY;  Surgeon: Danie Binder, MD;  Location: [facility];  Service: Endoscopy;  Laterality: N/A;  descending colon polyps x2; rectal polyp x1  . THROMBECTOMY FEMORAL ARTERY Left 07/13/2017   Procedure: THROMBECTOMY FEMORAL ARTERY;  Surgeon: Waynetta Sandy, MD;  Location: [place];  Service: Vascular;  Laterality: Left;    Current Outpatient Rx  . Order #: [id number] Class: Print  .  Order #: [id number] Class: Print  . Order #: [id number] Class: Print  . Order #: [id number] Class: Print  . Order #: [id number] Class: Print  . Order #: [id number] Class: Print  . Order #: [id number] Class: Print  . Order #: [id number] Class: Print    Allergies Acyclovir and related  Family History  Problem Relation Age of Onset  . Colon cancer Neg Hx     Social History Social History   Tobacco Use  . Smoking status: Current Some Day Smoker    Packs/day: 0.50    Types: Cigarettes  . Smokeless tobacco: Never Used  Substance Use Topics  . Alcohol use: No    Comment: hx of  ETOH abuse  . Drug use: No    Review of Systems  Constitutional: No fever/chills Eyes: No visual changes. ENT: No sore throat. Cardiovascular: Denies chest pain. Respiratory: Denies shortness of breath. Gastrointestinal: No abdominal pain.  No nausea, no vomiting.  No diarrhea.  No constipation. Genitourinary: Negative for dysuria. Musculoskeletal: Negative for back pain. Skin: Negative for rash. Positive drainage from left leg wound.  Neurological: Negative for headaches, focal weakness or numbness.  10-point ROS otherwise negative.  ____________________________________________   PHYSICAL EXAM:  VITAL SIGNS: ED Triage Vitals [08/11/17 1353]  Enc Vitals Group     BP 100/65     Pulse Rate 76     Resp 18     Temp 98.2 F (36.8 C)     Temp Source Oral     SpO2 100 %   Constitutional: Alert and oriented. Chronically ill-appearing but in no acute distress.  Eyes: Conjunctivae are normal. Head: Atraumatic. Nose: No congestion/rhinnorhea. Mouth/Throat: Mucous membranes are moist.  Neck: No stridor.  Cardiovascular: Normal rate, regular rhythm. Good peripheral circulation. Grossly normal heart sounds.   Respiratory: Normal respiratory effort.  No retractions. Lungs CTAB. Gastrointestinal: Soft and nontender. No distention.  Musculoskeletal: Left AKA with staples in place and red/brown drainage from the center of the wound. No surrounding cellulitis. No pus or frank, BRB.  Neurologic:  Normal speech and language. No gross focal neurologic deficits are appreciated.  Skin:  Skin is warm, dry and intact. No rash noted.   ____________________________________________   LABS (all labs ordered are listed, but only abnormal results are displayed)  Labs Reviewed  BASIC METABOLIC PANEL - Abnormal; Notable for the following components:      Result Value   Glucose, Bld 183 (*)    Calcium 7.9 (*)    All other components within normal limits  CBC WITH DIFFERENTIAL/PLATELET -  Abnormal; Notable for the following components:   RDW 19.1 (*)    All other components within normal limits  I-STAT CG4 LACTIC ACID, ED - Abnormal; Notable for the following components:   Lactic Acid, Venous 2.13 (*)    All other components within normal limits  CULTURE, BLOOD (ROUTINE X 2)  CULTURE, BLOOD (ROUTINE X 2)  I-STAT TROPONIN, ED  I-STAT CG4 LACTIC ACID, ED   ____________________________________________  EKG   EKG Interpretation  Date/Time:  Saturday August 11 2017 14:45:06 EST Ventricular Rate:  74 PR Interval:    QRS Duration: 94 QT Interval:  446 QTC Calculation: 495 R Axis:   -7 Text Interpretation:  Sinus rhythm Abnormal R-wave progression, early transition Borderline repolarization abnormality Borderline prolonged QT interval Baseline wander in lead(s) V6 No STEMI.  Confirmed by Nanda Quinton [phone]) on 08/11/2017 2:50:05 PM       ____________________________________________  RADIOLOGY  Dg Femur 1 View Left  Result Date: 08/11/2017  CLINICAL DATA:  Drainage an amputation site EXAM: LEFT FEMUR 1 VIEW COMPARISON:  None. FINDINGS: Frontal view obtained. The patient has had amputation at the mid femur level. The bony structures which remain appear intact without bony destruction. A small amount of calcification is noted just distal to the amputation site. No acute fracture or dislocation. There are skin staples at the stump site of the amputation. There is no appreciable soft tissue air in the area of recent amputation. There does appear to be soft tissue edema distal to the remaining femur. IMPRESSION: Skin staples at stump site. There is soft tissue fullness which could potentially represent postoperative hematoma surrounding the distal remaining femur. No air is seen in this area to indicate frank abscess. No osteomyelitis evident.  No acute fracture or dislocation. Electronically Signed   By: Lowella Grip III M.D.   On: 08/11/2017 14:44     ____________________________________________   PROCEDURES  Procedure(s) performed:   Procedures  None ____________________________________________   INITIAL IMPRESSION / ASSESSMENT AND PLAN / ED COURSE  Pertinent labs & imaging results that were available during my care of the patient were reviewed by me and considered in my medical decision making (see chart for details).  Patient presents for evaluation of drainage from a left leg wound and mild low BP. Patient is asymptomatic at this time. No fever or hypotension symptoms. She is a hospice patient at home and is trying for transfer to inpatient hospice. Husband states that a facility just opened up and she may be placed there soon. Plan for labs, plain film, and vascular surgery consultation.   02:45 PM Spoke with Dr. Donzetta Matters with vascular surgery who will come and evaluate the patient's wound.   03:30 PM Dr. Donzetta Matters evaluated the patient.  He found evidence of a small hematoma near the wound which is the source of drainage.  He packed the wound.  He did not find any sign of infection.  X-ray and other lab work reviewed.  Patient does have mild elevated lactic acidosis but no other infection signs or symptoms.  I suspect that this is secondary to dehydration. Continues to be asymptomatic. BP soft but MAP > 65. Offered additional IVF but she would like to return home before having to go to [facility].  ____________________________________________  FINAL CLINICAL IMPRESSION(S) / ED DIAGNOSES  Final diagnoses:  Visit for wound check     MEDICATIONS GIVEN DURING THIS VISIT:  Medications  sodium chloride 0.9 % bolus 1,000 mL (0 mLs Intravenous Stopped 08/11/17 1619)    Note:  This document was prepared using Dragon voice recognition software and may include unintentional dictation errors.  Nanda Quinton, MD Emergency Medicine    Long, [facility], MD 08/11/17 2002

## 2017-08-11 NOTE — ED Notes (Signed)
Reviewed VS with MD concerning code sepsis, no new orders at this time.

## 2017-08-11 NOTE — ED Triage Notes (Addendum)
Pt is a hospice patient coming from hospice care facility for evaluation of left leg AKA wound assessment, reports surgery was in January and she now has white drainage. Hypotensive with EMS, fiven 400 CC NS. Has an active DNR but did not bring the form with EMS

## 2017-08-11 NOTE — Discharge Instructions (Signed)
You were seen in the ED today with drainage from the wound. There is no sign of infection in the leg. Change the dressings, wet-to-dry, and follow up with Vascular Surgery. Call the inpatient hospice center to discuss continued care. Return to the ED with any fever, chills, weakness, or lightheadedness.

## 2017-08-11 NOTE — ED Notes (Signed)
XR at bedside

## 2017-08-11 NOTE — ED Notes (Signed)
[facility] contacted to transport patient to [facility]

## 2017-08-14 DIAGNOSIS — I10 Essential (primary) hypertension: Secondary | ICD-10-CM | POA: Diagnosis not present

## 2017-08-14 DIAGNOSIS — I4891 Unspecified atrial fibrillation: Secondary | ICD-10-CM | POA: Diagnosis not present

## 2017-08-16 LAB — CULTURE, BLOOD (ROUTINE X 2)
Culture: NO GROWTH
Culture: NO GROWTH
SPECIAL REQUESTS: ADEQUATE
Special Requests: ADEQUATE

## 2017-08-17 DIAGNOSIS — Z89612 Acquired absence of left leg above knee: Secondary | ICD-10-CM | POA: Diagnosis not present

## 2017-08-17 DIAGNOSIS — M79605 Pain in left leg: Secondary | ICD-10-CM | POA: Diagnosis not present

## 2017-08-17 DIAGNOSIS — J449 Chronic obstructive pulmonary disease, unspecified: Secondary | ICD-10-CM | POA: Diagnosis not present

## 2017-08-17 DIAGNOSIS — T8149XA Infection following a procedure, other surgical site, initial encounter: Secondary | ICD-10-CM | POA: Diagnosis not present

## 2017-08-20 DIAGNOSIS — Z89612 Acquired absence of left leg above knee: Secondary | ICD-10-CM | POA: Diagnosis not present

## 2017-08-20 DIAGNOSIS — T8149XA Infection following a procedure, other surgical site, initial encounter: Secondary | ICD-10-CM | POA: Diagnosis not present

## 2017-08-20 DIAGNOSIS — L97119 Non-pressure chronic ulcer of right thigh with unspecified severity: Secondary | ICD-10-CM | POA: Diagnosis not present

## 2017-08-21 NOTE — Progress Notes (Signed)
    Postoperative Visit    History of Present Illness   Shelley [name] is a 72 y.o. female who presents for postoperative follow-up for: left TE L fem-pop, AT, TPT and peroneal artery, exploration of L calf compartments (07/12/17) by myself,  left above-the-knee ampuation, L fem TE by Dr. Donzetta Matters.(Date: 07/13/17).  This patient had come to the ED after several days of acute limb ischemia on the left.  Her left calf muscles were already partially dead but patient and family had not given permission for amputation at the time of the thrombectomy.  Further TE work-up demonstrated a proximal embolism in the L femoral system.  She underwent the TE and L AKA with Dr. Donzetta Matters the next day.  The patient has returned to the ED recently with a hematoma and some fat necrosis which was being managed with wound care.  The patient is currently in inpatient hospice.  The patient's wounds are healing.  The patient notes pain is well controlled.     For VQI Use Only   PRE-ADM LIVING: Nursing home  AMB STATUS: Wheelchair   Physical Examination   Vitals:   08/22/17 0942  Pulse: (!) 143  Resp: 16  Temp: (!) 97 F (36.1 C)  SpO2: 90%    LLE: L groin healed, Stump incision is healed where stapes, cental area of separation: 5 cm x 3 cm x 3 cm, good granulation, no drainage and clean   Medical Decision Making   Shelley [name] is a 72 y.o. female who presents s/p left above-the-knee amputation for acute limb ischemia due to thromboembolism likely due to afib, dementia   Recommend: VAC dressing to L AKA stump: 125 continuous, M-W-F  If not possible, continued wet to dry dressings BID to L AKA wound.  Follow up in one month for wound check.  The patient can follow up with [place] as needed.  Adele Barthel, MD, FACS [facility] Office: [phone] Pager: [phone]

## 2017-08-22 ENCOUNTER — Encounter: Payer: Self-pay | Admitting: Vascular Surgery

## 2017-08-22 ENCOUNTER — Other Ambulatory Visit: Payer: Self-pay

## 2017-08-22 ENCOUNTER — Ambulatory Visit (INDEPENDENT_AMBULATORY_CARE_PROVIDER_SITE_OTHER): Payer: Medicare Other | Admitting: Vascular Surgery

## 2017-08-22 DIAGNOSIS — Z89612 Acquired absence of left leg above knee: Secondary | ICD-10-CM

## 2017-08-28 DIAGNOSIS — Z89612 Acquired absence of left leg above knee: Secondary | ICD-10-CM | POA: Diagnosis not present

## 2017-08-28 DIAGNOSIS — M6281 Muscle weakness (generalized): Secondary | ICD-10-CM | POA: Diagnosis not present

## 2017-08-28 DIAGNOSIS — R1310 Dysphagia, unspecified: Secondary | ICD-10-CM | POA: Diagnosis not present

## 2017-08-29 DIAGNOSIS — I4891 Unspecified atrial fibrillation: Secondary | ICD-10-CM | POA: Diagnosis not present

## 2017-08-29 DIAGNOSIS — L97119 Non-pressure chronic ulcer of right thigh with unspecified severity: Secondary | ICD-10-CM | POA: Diagnosis not present

## 2017-08-29 DIAGNOSIS — Z89612 Acquired absence of left leg above knee: Secondary | ICD-10-CM | POA: Diagnosis not present

## 2017-08-30 ENCOUNTER — Telehealth: Payer: Self-pay | Admitting: Vascular Surgery

## 2017-08-30 NOTE — Telephone Encounter (Signed)
I Spoke to the nurse at [employer] at [place].  She confirmed that Mrs. Lobdell has been using her wound vac to her left AKA since 08/27/2017.  Thurston Hole., LPN

## 2017-08-31 DIAGNOSIS — R110 Nausea: Secondary | ICD-10-CM | POA: Diagnosis not present

## 2017-08-31 DIAGNOSIS — R410 Disorientation, unspecified: Secondary | ICD-10-CM | POA: Diagnosis not present

## 2017-08-31 DIAGNOSIS — L97119 Non-pressure chronic ulcer of right thigh with unspecified severity: Secondary | ICD-10-CM | POA: Diagnosis not present

## 2017-08-31 DIAGNOSIS — Z4781 Encounter for orthopedic aftercare following surgical amputation: Secondary | ICD-10-CM | POA: Diagnosis not present

## 2017-08-31 DIAGNOSIS — Z89612 Acquired absence of left leg above knee: Secondary | ICD-10-CM | POA: Diagnosis not present

## 2017-08-31 DIAGNOSIS — R1310 Dysphagia, unspecified: Secondary | ICD-10-CM | POA: Diagnosis not present

## 2017-08-31 DIAGNOSIS — R4701 Aphasia: Secondary | ICD-10-CM | POA: Diagnosis not present

## 2017-08-31 DIAGNOSIS — I10 Essential (primary) hypertension: Secondary | ICD-10-CM | POA: Diagnosis not present

## 2017-08-31 DIAGNOSIS — N390 Urinary tract infection, site not specified: Secondary | ICD-10-CM | POA: Diagnosis not present

## 2017-08-31 DIAGNOSIS — M6281 Muscle weakness (generalized): Secondary | ICD-10-CM | POA: Diagnosis not present

## 2017-08-31 DIAGNOSIS — M810 Age-related osteoporosis without current pathological fracture: Secondary | ICD-10-CM | POA: Diagnosis not present

## 2017-08-31 DIAGNOSIS — Z720 Tobacco use: Secondary | ICD-10-CM | POA: Diagnosis not present

## 2017-08-31 DIAGNOSIS — I69891 Dysphagia following other cerebrovascular disease: Secondary | ICD-10-CM | POA: Diagnosis not present

## 2017-08-31 DIAGNOSIS — N3090 Cystitis, unspecified without hematuria: Secondary | ICD-10-CM | POA: Diagnosis not present

## 2017-08-31 DIAGNOSIS — R918 Other nonspecific abnormal finding of lung field: Secondary | ICD-10-CM | POA: Diagnosis not present

## 2017-08-31 DIAGNOSIS — R509 Fever, unspecified: Secondary | ICD-10-CM | POA: Diagnosis not present

## 2017-08-31 DIAGNOSIS — M62262 Nontraumatic ischemic infarction of muscle, left lower leg: Secondary | ICD-10-CM | POA: Diagnosis not present

## 2017-08-31 DIAGNOSIS — R1312 Dysphagia, oropharyngeal phase: Secondary | ICD-10-CM | POA: Diagnosis not present

## 2017-08-31 DIAGNOSIS — I5032 Chronic diastolic (congestive) heart failure: Secondary | ICD-10-CM | POA: Diagnosis not present

## 2017-08-31 DIAGNOSIS — E440 Moderate protein-calorie malnutrition: Secondary | ICD-10-CM | POA: Diagnosis not present

## 2017-08-31 DIAGNOSIS — T8149XD Infection following a procedure, other surgical site, subsequent encounter: Secondary | ICD-10-CM | POA: Diagnosis not present

## 2017-08-31 DIAGNOSIS — M79605 Pain in left leg: Secondary | ICD-10-CM | POA: Diagnosis not present

## 2017-08-31 DIAGNOSIS — I4891 Unspecified atrial fibrillation: Secondary | ICD-10-CM | POA: Diagnosis not present

## 2017-08-31 DIAGNOSIS — T8149XA Infection following a procedure, other surgical site, initial encounter: Secondary | ICD-10-CM | POA: Diagnosis not present

## 2017-08-31 DIAGNOSIS — Z515 Encounter for palliative care: Secondary | ICD-10-CM | POA: Diagnosis not present

## 2017-08-31 DIAGNOSIS — D649 Anemia, unspecified: Secondary | ICD-10-CM | POA: Diagnosis not present

## 2017-08-31 DIAGNOSIS — Z8673 Personal history of transient ischemic attack (TIA), and cerebral infarction without residual deficits: Secondary | ICD-10-CM | POA: Diagnosis not present

## 2017-08-31 DIAGNOSIS — R569 Unspecified convulsions: Secondary | ICD-10-CM | POA: Diagnosis not present

## 2017-08-31 DIAGNOSIS — R319 Hematuria, unspecified: Secondary | ICD-10-CM | POA: Diagnosis not present

## 2017-08-31 DIAGNOSIS — J449 Chronic obstructive pulmonary disease, unspecified: Secondary | ICD-10-CM | POA: Diagnosis not present

## 2017-08-31 DIAGNOSIS — R0603 Acute respiratory distress: Secondary | ICD-10-CM | POA: Diagnosis not present

## 2017-09-03 DIAGNOSIS — T8149XA Infection following a procedure, other surgical site, initial encounter: Secondary | ICD-10-CM | POA: Diagnosis not present

## 2017-09-05 DIAGNOSIS — R110 Nausea: Secondary | ICD-10-CM | POA: Diagnosis not present

## 2017-09-05 DIAGNOSIS — L97119 Non-pressure chronic ulcer of right thigh with unspecified severity: Secondary | ICD-10-CM | POA: Diagnosis not present

## 2017-09-11 DIAGNOSIS — I10 Essential (primary) hypertension: Secondary | ICD-10-CM | POA: Diagnosis not present

## 2017-09-11 DIAGNOSIS — I4891 Unspecified atrial fibrillation: Secondary | ICD-10-CM | POA: Diagnosis not present

## 2017-09-11 DIAGNOSIS — M79605 Pain in left leg: Secondary | ICD-10-CM | POA: Diagnosis not present

## 2017-09-17 DIAGNOSIS — L97119 Non-pressure chronic ulcer of right thigh with unspecified severity: Secondary | ICD-10-CM | POA: Diagnosis not present

## 2017-09-17 NOTE — Progress Notes (Signed)
    Postoperative Visit    History of Present Illness   Shelley [name] is a 72 y.o. (01-Sep-1945) female who presents for postoperative follow-up for: left TE L fem-pop, AT, TPT and peroneal artery, exploration of L calf compartments (07/12/17) by myself,  left above-the-knee ampuation, L fem TE by Dr. Donzetta Matters.(Date: 07/13/17).  This patient had come to the ED after several days of acute limb ischemia on the left.  Her left calf muscles were already partially dead but patient and family had not given permission for amputation at the time of the thrombectomy.  Further TE work-up demonstrated a proximal embolism in the L femoral system.  She underwent the TE and L AKA with Dr. Donzetta Matters the next day.  The patient has returned to the ED recently with a hematoma and some fat necrosis which was being managed with wound care.  The patient is currently in inpatient hospice.  The patient's wounds are healing.  The patient notes pain is well controlled.     Physical Examination   Vitals:   09/19/17 1026  BP: (!) 150/114  Pulse: 93  Resp: 20  Temp: (!) 97.5 F (36.4 C)  TempSrc: Oral  SpO2: 99%  Weight: 153 lb (69.4 kg)  Height: 5\' 5"  (1.651 m)    LLE: contracture of L AKA wound, still 2 cm wound   Medical Decision Making   Shelley [name] is a 72 y.o. (06/09/1946) female  who presents s/p left above-the-knee amputation for acute limb ischemia due to thromboembolism likely due to afib, dementia   Per discussion with pt's husband, some difficulty with the VAC, so some question of whether VAC is being managed properly.  Switch to wet to dry dressings BID to L AKA wound.  Follow up in one month for wound check.  The patient can follow up with [place] as needed.  Adele Barthel, MD, FACS [facility] [place] Office: [phone] Pager: [phone]

## 2017-09-19 ENCOUNTER — Encounter: Payer: Self-pay | Admitting: Vascular Surgery

## 2017-09-19 ENCOUNTER — Ambulatory Visit (INDEPENDENT_AMBULATORY_CARE_PROVIDER_SITE_OTHER): Payer: Medicare Other | Admitting: Vascular Surgery

## 2017-09-19 ENCOUNTER — Other Ambulatory Visit: Payer: Self-pay

## 2017-09-19 VITALS — BP 150/114 | HR 93 | Temp 97.5°F | Resp 20 | Ht 65.0 in | Wt 153.0 lb

## 2017-09-19 DIAGNOSIS — I998 Other disorder of circulatory system: Secondary | ICD-10-CM | POA: Insufficient documentation

## 2017-09-19 DIAGNOSIS — Z89612 Acquired absence of left leg above knee: Secondary | ICD-10-CM

## 2017-09-19 DIAGNOSIS — I70229 Atherosclerosis of native arteries of extremities with rest pain, unspecified extremity: Secondary | ICD-10-CM | POA: Insufficient documentation

## 2017-09-24 DIAGNOSIS — L97119 Non-pressure chronic ulcer of right thigh with unspecified severity: Secondary | ICD-10-CM | POA: Diagnosis not present

## 2017-09-26 DIAGNOSIS — N3090 Cystitis, unspecified without hematuria: Secondary | ICD-10-CM | POA: Diagnosis not present

## 2017-09-26 DIAGNOSIS — R0603 Acute respiratory distress: Secondary | ICD-10-CM | POA: Diagnosis not present

## 2017-10-01 DIAGNOSIS — Z515 Encounter for palliative care: Secondary | ICD-10-CM | POA: Diagnosis not present

## 2017-10-22 NOTE — Progress Notes (Deleted)
    Postoperative Visit    History of Present Illness   Shelley [name] is a 72 y.o. female who presents for postoperative follow-up for:  Left TE L-fempop, AT,TPT and peroneal artery, exploration of L calf compartments, L AKA (07/12/17).  Patient had L fem TE, L AKA with Dr. Donzetta Matters on 07/13/17.  The patient was in hospice previously.  She came back with incomplete healing of L AKA.  Patient returns today for wound check.  The patient's wounds are *** healed.  The patient notes pain is *** well controlled.  The patient's current symptoms are: ***.   Physical Examination  ***There were no vitals filed for this visit.  LLE: Stump incision is *** healed.  ***Staples are intact.   Medical Decision Making   Shelley [name] is a 72 y.o. female who presents s/p left above-the-knee amputation for acute limb ischemia due to thromboembolism likely due to afib, dementia   ***  The patient can follow up with [place] as needed.  Adele Barthel, MD, FACS [facility] [place] Office: [phone] Pager: [phone]

## 2017-10-24 ENCOUNTER — Ambulatory Visit: Payer: Medicare Other | Admitting: Vascular Surgery

## 2017-10-31 DEATH — deceased

## 2017-11-14 ENCOUNTER — Telehealth: Payer: Self-pay

## 2017-11-14 NOTE — Telephone Encounter (Signed)
T/C from [place] at [facility].  She said she needs Dr. Oneida Alar to sign plan of care for pt. I told her we have not seen the pt since 2013. She spoke to [place] and found out that pt was referred by Dr. Willey Blade. She has been on he program since 07/2017.  She will call Dr. Willey Blade for the plan of care.

## 2017-11-14 NOTE — Telephone Encounter (Signed)
Reviewed

## 2017-11-16 NOTE — Telephone Encounter (Signed)
REVIEWED-NO ADDITIONAL RECOMMENDATIONS. 

## 2019-07-15 IMAGING — CT CT HEAD W/O CM
4 series · 15 of 47 positions shown, 17 images · non-contrast
Comparison: MRI 06/08/2017.  CT 06/08/2017.

CLINICAL DATA: Confusion.  Dementia.  Previous strokes.

EXAM:
CT HEAD WITHOUT CONTRAST
TECHNIQUE: Contiguous axial images were obtained from the base of the skull
through the vertex without intravenous contrast.

[Series 3: head wo · axial · 0.43mm/px · z∈[-84,+36]mm · 7 of 34 slices shown, 9 images]
[im 5/34  brain]
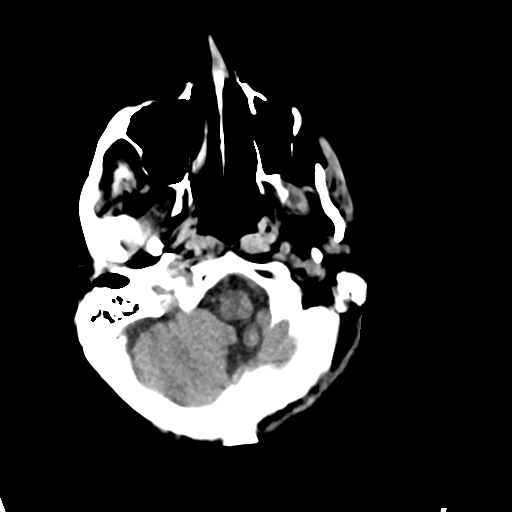
[im 5/34  bone]
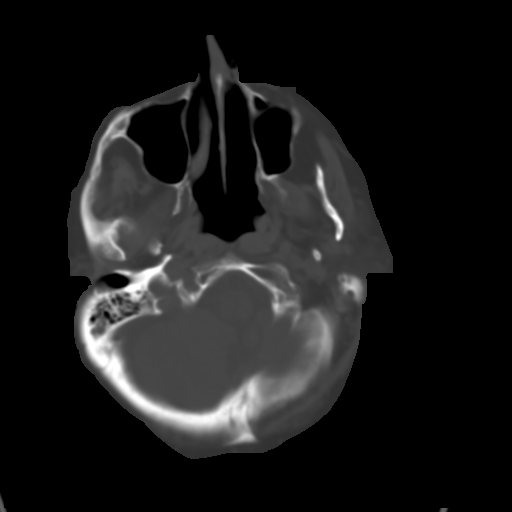
[im 9/34  brain]
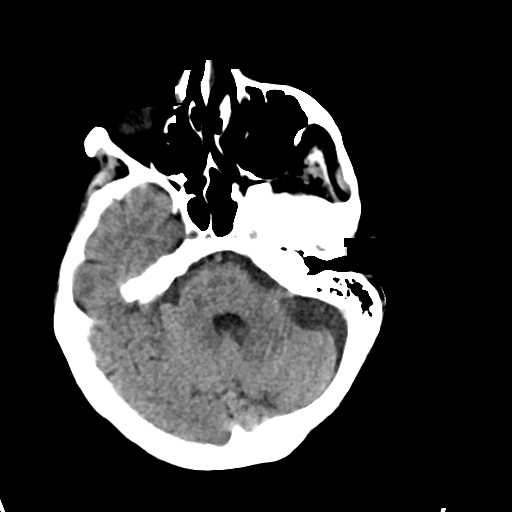
[im 13/34  brain]
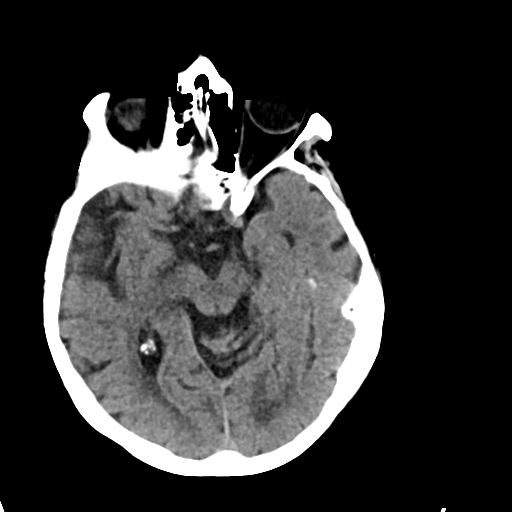
[im 17/34  brain]
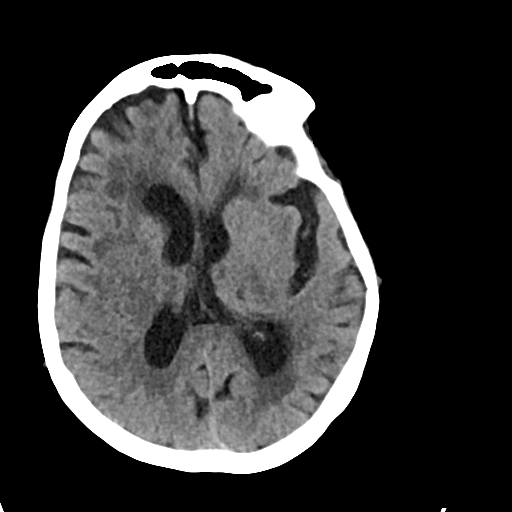
[im 21/34  brain]
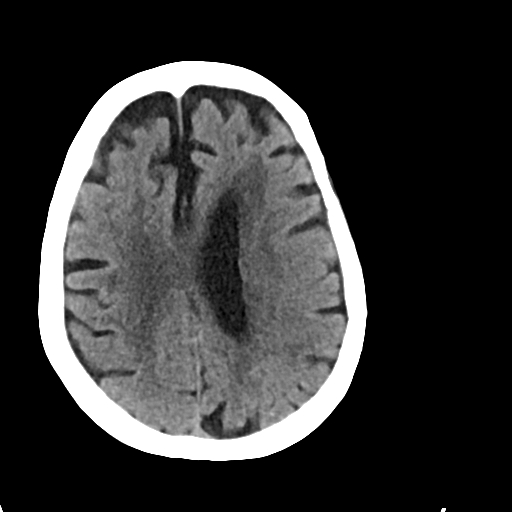
[im 21/34  bone]
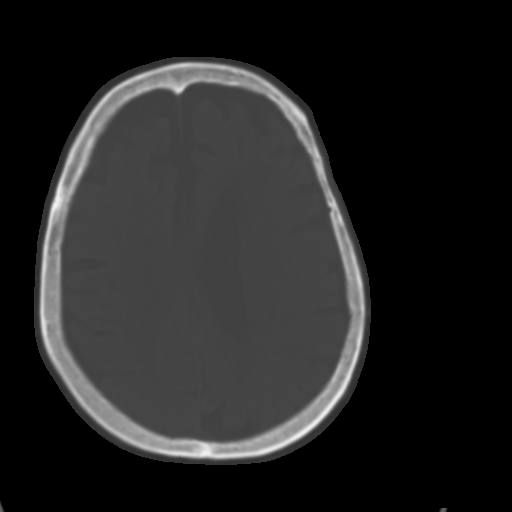
[im 25/34  brain]
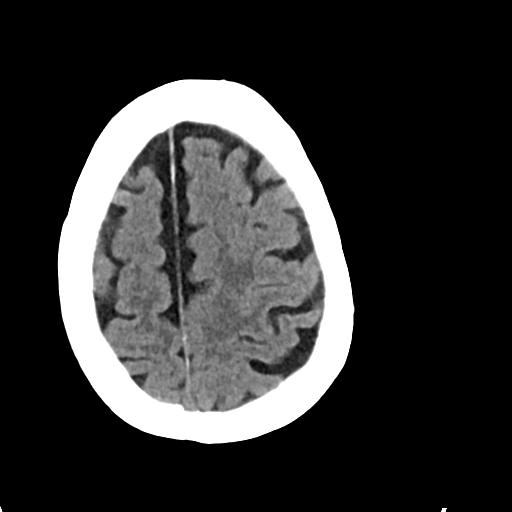
[im 29/34  brain]
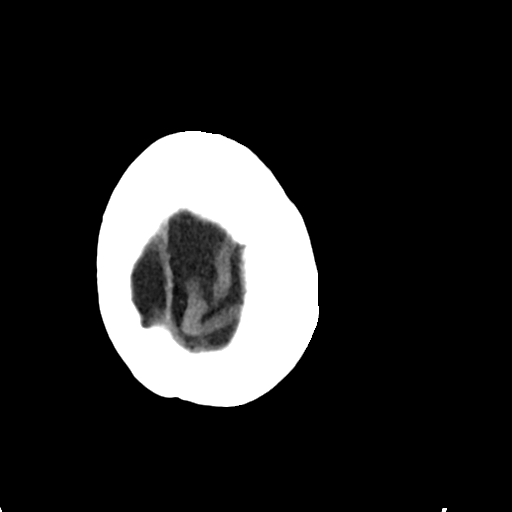

[Series 4: head bone · axial · 0.43mm/px · z∈[-88,-72]mm · 2 of 83 slices shown]
[im 9/83  bone]
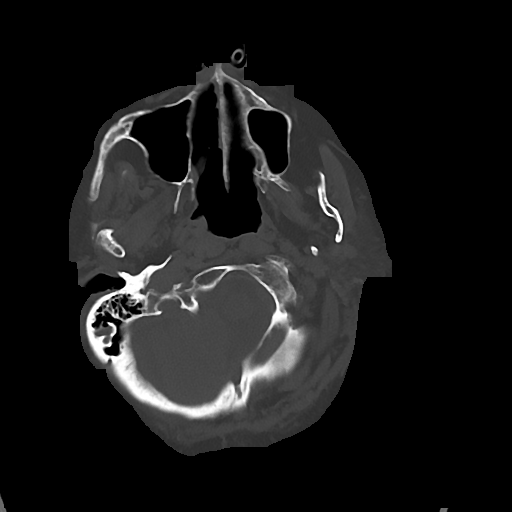
[im 17/83  bone]
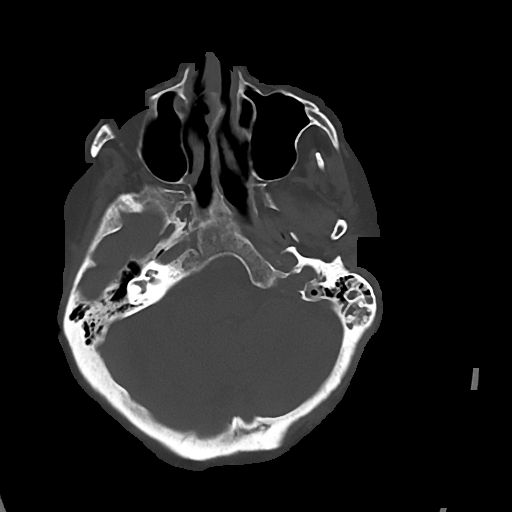

[Series 5: cor soft · coronal · 0.31mm/px · 3 of 67 slices shown]
[im 23/67  brain]
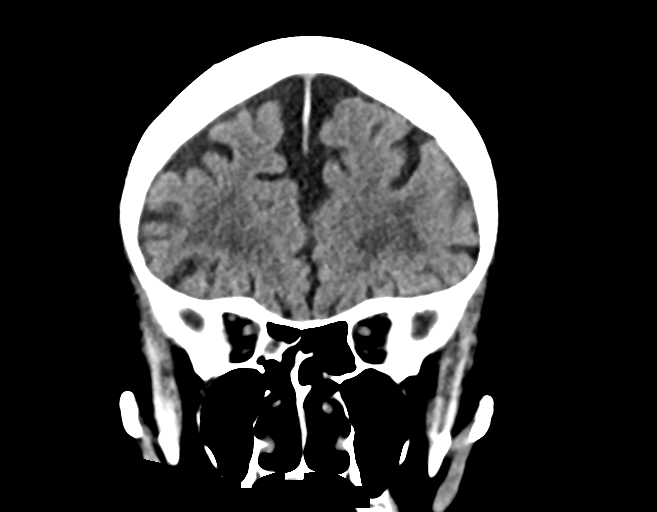
[im 30/67  brain]
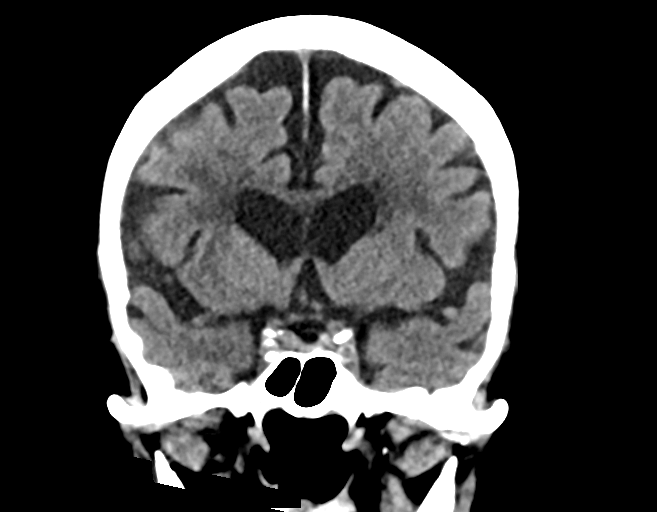
[im 37/67  brain]
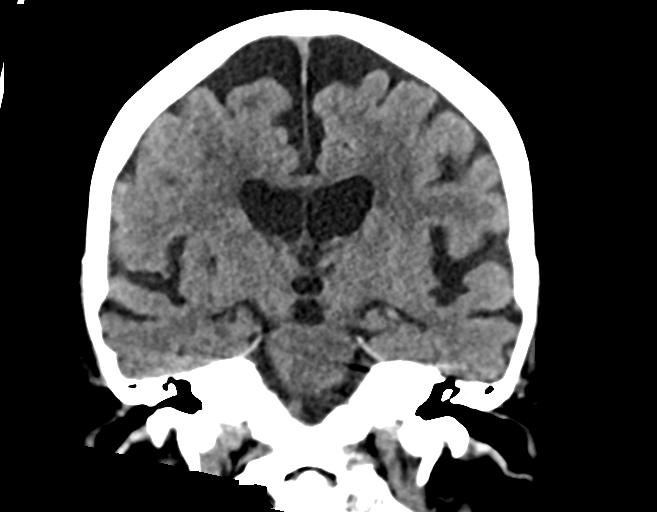

[Series 6: sag soft · sagittal · 0.36mm/px · 3 of 67 slices shown]
[im 29/67  brain]
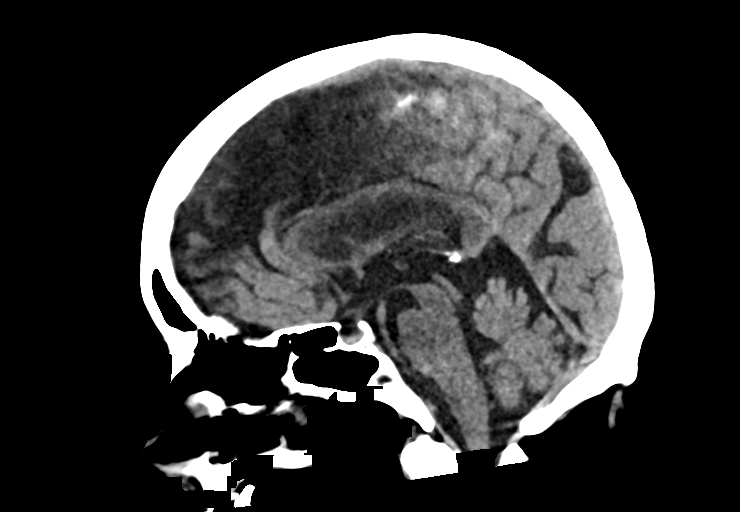
[im 34/67  brain]
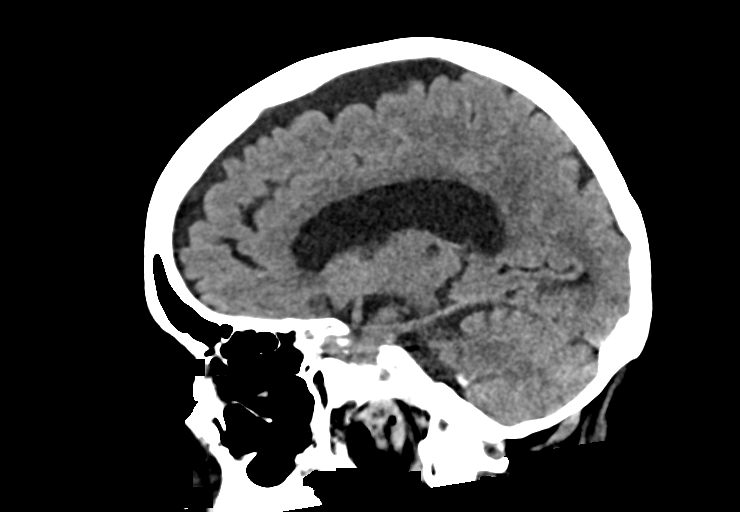
[im 38/67  brain]
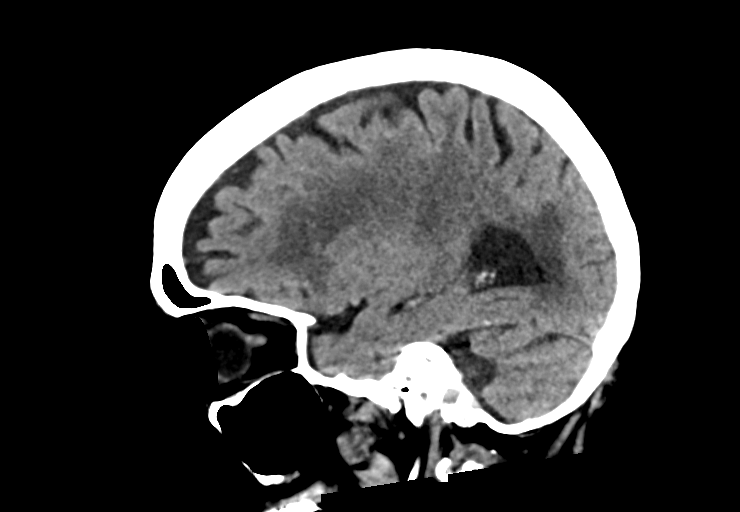

[15 of 47 positions shown; findings below may reference images not displayed]

FINDINGS: Brain: Advanced chronic small vessel ischemic changes throughout the
cerebral hemispheric white matter, thalami I and basal ganglia. No
sign of acute infarction, mass lesion, hemorrhage, hydrocephalus or
extra-axial collection.

Vascular: There is atherosclerotic calcification of the major
vessels at the base of the brain.

Skull: Negative

Sinuses/Orbits: Clear/normal

Other: None
IMPRESSION: No acute finding by CT. Advanced chronic small-vessel ischemic
changes throughout the brain as seen previously.

## 2019-07-17 IMAGING — DX DG CHEST 1V PORT
1 series · 1 of 1 positions shown · non-contrast
Comparison: Chest radiograph dated 07/20/2017

CLINICAL DATA: 71-year-old female with shortness of breath.

EXAM:
PORTABLE CHEST 1 VIEW

[chest ap]
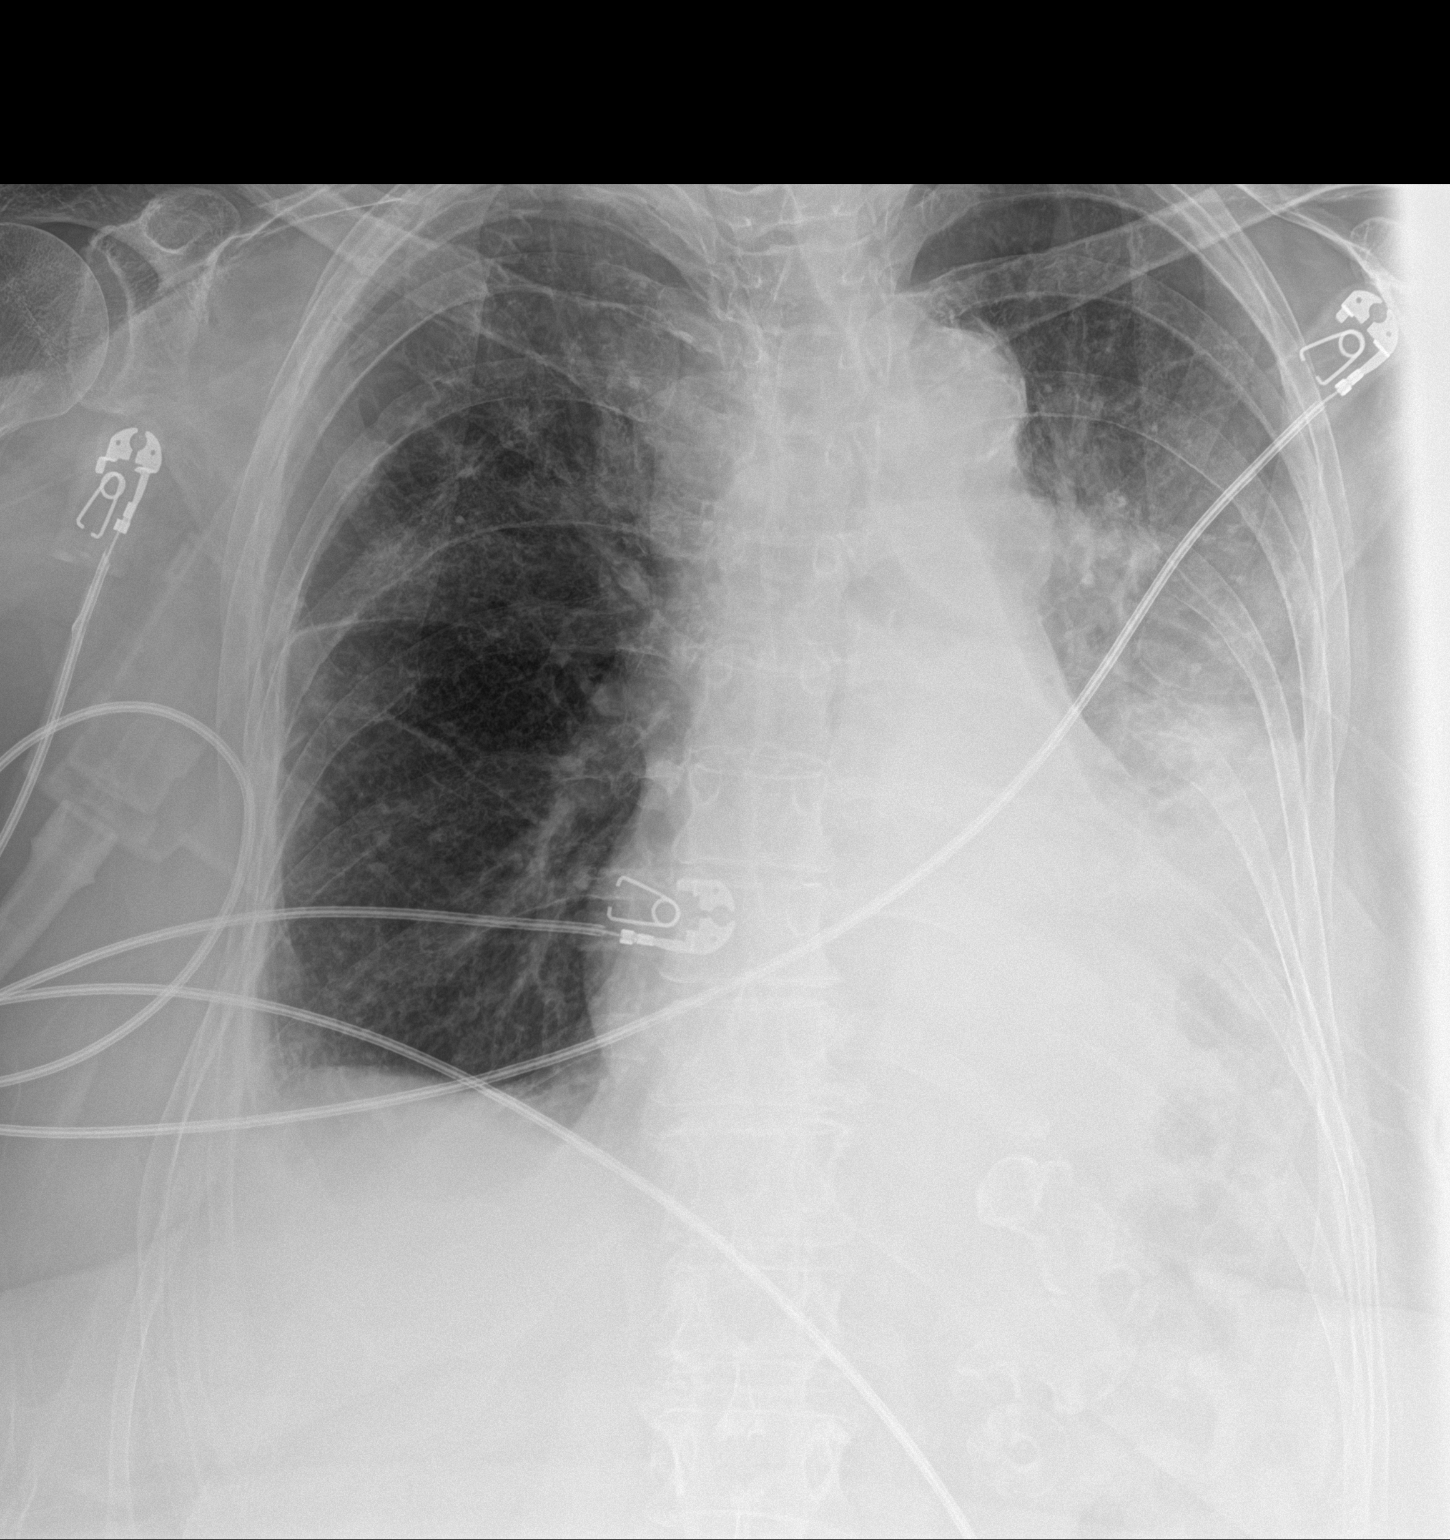

[1 of 1 positions shown; findings below may reference images not displayed]

FINDINGS: An area of increased opacity in the left mid to lower lung field
similar to prior radiograph most consistent with pneumonia. Faint
density in the right upper lung also similar to prior radiograph.
Bilateral small pleural effusions similar to prior radiograph. There
is no pneumothorax stable cardiac silhouette. Atherosclerotic
calcification of the aortic arch no acute osseous pathology.
Vascular calcification in the upper abdomen noted.
IMPRESSION: Overall no significant interval change in the aeration of the lungs,
and pulmonary opacities compared to prior radiograph. Bilateral
pleural effusions similar to prior radiograph. Clinical correlation
and follow-up recommended.
